# Patient Record
Sex: Female | Born: 1945 | Race: Black or African American | Hispanic: No | Marital: Married | Smoking: Former smoker
Health system: Southern US, Community
[De-identification: ages and names within clinical notes are randomized; demographics above are authoritative.]

## PROBLEM LIST (undated history)

## (undated) DIAGNOSIS — I10 Essential (primary) hypertension: Secondary | ICD-10-CM

## (undated) DIAGNOSIS — E119 Type 2 diabetes mellitus without complications: Secondary | ICD-10-CM

## (undated) DIAGNOSIS — E785 Hyperlipidemia, unspecified: Secondary | ICD-10-CM

## (undated) DIAGNOSIS — G629 Polyneuropathy, unspecified: Secondary | ICD-10-CM

## (undated) DIAGNOSIS — I5022 Chronic systolic (congestive) heart failure: Secondary | ICD-10-CM

## (undated) DIAGNOSIS — K219 Gastro-esophageal reflux disease without esophagitis: Secondary | ICD-10-CM

## (undated) DIAGNOSIS — I779 Disorder of arteries and arterioles, unspecified: Secondary | ICD-10-CM

## (undated) DIAGNOSIS — Z95810 Presence of automatic (implantable) cardiac defibrillator: Secondary | ICD-10-CM

## (undated) DIAGNOSIS — J449 Chronic obstructive pulmonary disease, unspecified: Secondary | ICD-10-CM

## (undated) DIAGNOSIS — M1990 Unspecified osteoarthritis, unspecified site: Secondary | ICD-10-CM

## (undated) DIAGNOSIS — I739 Peripheral vascular disease, unspecified: Secondary | ICD-10-CM

## (undated) DIAGNOSIS — G510 Bell's palsy: Secondary | ICD-10-CM

## (undated) DIAGNOSIS — E1140 Type 2 diabetes mellitus with diabetic neuropathy, unspecified: Secondary | ICD-10-CM

## (undated) DIAGNOSIS — I428 Other cardiomyopathies: Secondary | ICD-10-CM

## (undated) DIAGNOSIS — R0600 Dyspnea, unspecified: Secondary | ICD-10-CM

## (undated) DIAGNOSIS — I2510 Atherosclerotic heart disease of native coronary artery without angina pectoris: Secondary | ICD-10-CM

## (undated) DIAGNOSIS — G458 Other transient cerebral ischemic attacks and related syndromes: Secondary | ICD-10-CM

## (undated) DIAGNOSIS — Z87891 Personal history of nicotine dependence: Secondary | ICD-10-CM

## (undated) DIAGNOSIS — J189 Pneumonia, unspecified organism: Secondary | ICD-10-CM

## (undated) HISTORY — DX: Hyperlipidemia, unspecified: E78.5

## (undated) HISTORY — DX: Type 2 diabetes mellitus without complications: E11.9

## (undated) HISTORY — PX: APPENDECTOMY: SHX54

## (undated) HISTORY — PX: CARDIAC CATHETERIZATION: SHX172

## (undated) HISTORY — PX: TUBAL LIGATION: SHX77

## (undated) HISTORY — DX: Chronic obstructive pulmonary disease, unspecified: J44.9

## (undated) HISTORY — PX: JOINT REPLACEMENT: SHX530

## (undated) HISTORY — PX: ABDOMINAL HYSTERECTOMY: SHX81

## (undated) HISTORY — DX: Essential (primary) hypertension: I10

## (undated) HISTORY — DX: Polyneuropathy, unspecified: G62.9

## (undated) HISTORY — DX: Bell's palsy: G51.0

---
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
## 2001-04-16 ENCOUNTER — Other Ambulatory Visit: Admission: RE | Admit: 2001-04-16 | Discharge: 2001-04-16 | Payer: Self-pay | Admitting: Family Medicine

## 2001-05-08 ENCOUNTER — Emergency Department (HOSPITAL_COMMUNITY): Admission: EM | Admit: 2001-05-08 | Discharge: 2001-05-08 | Payer: Self-pay | Admitting: Emergency Medicine

## 2004-11-23 ENCOUNTER — Ambulatory Visit: Payer: Self-pay | Admitting: Family Medicine

## 2005-05-31 ENCOUNTER — Ambulatory Visit: Payer: Self-pay | Admitting: Family Medicine

## 2005-06-07 ENCOUNTER — Ambulatory Visit: Payer: Self-pay | Admitting: Family Medicine

## 2007-03-25 ENCOUNTER — Encounter (INDEPENDENT_AMBULATORY_CARE_PROVIDER_SITE_OTHER): Payer: Self-pay | Admitting: *Deleted

## 2007-07-31 ENCOUNTER — Ambulatory Visit: Payer: Self-pay | Admitting: Vascular Surgery

## 2008-01-29 ENCOUNTER — Ambulatory Visit: Payer: Self-pay | Admitting: Vascular Surgery

## 2008-02-05 ENCOUNTER — Ambulatory Visit: Payer: Self-pay | Admitting: Vascular Surgery

## 2008-08-12 ENCOUNTER — Ambulatory Visit: Payer: Self-pay | Admitting: Vascular Surgery

## 2010-12-29 ENCOUNTER — Encounter (INDEPENDENT_AMBULATORY_CARE_PROVIDER_SITE_OTHER): Payer: Self-pay | Admitting: Vascular Surgery

## 2010-12-29 DIAGNOSIS — I6529 Occlusion and stenosis of unspecified carotid artery: Secondary | ICD-10-CM

## 2011-01-02 NOTE — Assessment & Plan Note (Signed)
OFFICE VISIT  Lisa [name], Lisa [name] DOB:  28-Sep-1945                                       12/29/2010 ZOXWR#:[id number]  CHIEF COMPLAINT:  Carotid stenosis.  HISTORY OF PRESENT ILLNESS:  The patient is a 65 year old female referred by Dr. Katrinka Blazing for evaluation of carotid stenosis.  She was seen for previous similar situation in November 2009.  At that time she had a moderate left carotid stenosis which is asymptomatic.  At that time the plan was risk factor modification with smoking cessation and a repeat carotid duplex scan.  However, she did not return for follow-up for that duplex scan.  At this time she continues to deny any symptoms of TIA, amaurosis or stroke.  She, unfortunately, continues to smoke.  She states that she does have some occasional numbness and tingling in her fingertips and toes but she denies any frank TIA or localizing neurologic symptoms.  CHRONIC MEDICAL PROBLEMS:  Ongoing tobacco abuse, diabetes, COPD.  These are all currently controlled and followed by Dr. Katrinka Blazing.  SOCIAL HISTORY:  She is self-employed.  She is married.  She has four children.  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  She does not consume alcohol regularly.  FAMILY HISTORY:  Not remarkable for early vascular disease, diabetes or early coronary disease.  REVIEW OF SYSTEMS:  CARDIAC:  She has some shortness of breath when lying flat. GI:  She occasionally has some blood in her stool but this has a history very consistent with hemorrhoid type presentation. All other review of systems are negative.  MEDICATIONS:  Aspirin, blood pressure medication in the form of atenolol.  She did not know the doses of any of these.  ALLERGIES:  She has no known drug allergies.  PHYSICAL EXAMINATION:  Blood pressure 162/71 in the right arm, 124/81 the left arm, heart rate 60 and regular.  HEENT:  Unremarkable.  Neck: Has 2+ carotid pulses with bilateral carotid bruits.  Chest:   Clear to auscultation.  Cardiac:  Regular rate and rhythm without murmur. Abdomen:  Soft, nontender, nondistended.  No masses.  Musculoskeletal: Exam shows no obvious major joint deformities.  Neurologic:  Exam shows symmetric upper extremity and lower extremity motor strength which is 5/5.  Skin:  No open ulcers or rashes.  Peripheral vascular:  She has 1+ radial pulses bilaterally.  She has 2+ femoral pulses and 1+ dorsalis pedis pulses bilaterally.  I reviewed her carotid duplex exam from [facility] dated 12/02/2010.  Her velocities are very similar to her previous ultrasound in 2009 and she is still in the 60% to 80% category on the left side. Of note, she also still has evidence of a left subclavian stenosis but this remains asymptomatic.  I had a lengthy discussion with the patient and her sister today.  I again explained to her that she has a moderate carotid stenosis that warrants continued surveillance with duplex ultrasound and we scheduled her for another ultrasound in six months' time.  If this progresses to greater than 80% we would consider carotid endarterectomy at that time. We would also consider carotid endarterectomy if she develops localizing symptoms prior to 80%.  I again emphasized to her how paramount it is for her to try to quit smoking.  She will try to do this in the future.  Greater than 3 minutes today were spent regarding smoking  cessation. The patient will return for follow-up duplex exam in six months' time and continue to take her aspirin.    Janetta Hora. Fields, MD Electronically Signed  CEF/MEDQ  D:  12/30/2010  T:  01/02/2011  Job:  [id number]  cc:   [facility] Crista Luria, MD

## 2011-02-07 NOTE — Procedures (Signed)
CAROTID DUPLEX EXAM   INDICATION:  Followup known coronary artery disease.   HISTORY:  Diabetes:  No.  Cardiac:  No.  Hypertension:  Yes.  Smoking:  Yes.  Previous Surgery:  No.  CV History:  Amaurosis Fugax  Yes  No, Paresthesias  Yes  No, Hemiparesis  Yes  No                                       RIGHT             LEFT  Brachial systolic pressure:         160               110  Brachial Doppler waveforms:         Biphasic.         Monophasic.  Vertebral direction of flow:        Antegrade.        Retrograde.  DUPLEX VELOCITIES (cm/sec)  CCA peak systolic                   84                79  ECA peak systolic                   193               149  ICA peak systolic                   82                303  ICA end diastolic                   21                75  PLAQUE MORPHOLOGY:                  Heterogeneous.    Heterogeneous.  PLAQUE AMOUNT:                      Moderate to severe.                 Mild.  PLAQUE LOCATION:                    ICA and ECA.      ICA and ECA.   IMPRESSION:  1. 60% to 79% stenosis noted in the left internal carotid artery.  2. 1% to 39% stenosis noted in the right internal carotid artery.  3. Antegrade right vertebral artery.  4. Retrograde left vertebral artery.   ___________________________________________  Janetta Hora Fields, MD   MG/MEDQ  D:  07/31/2007  T:  08/01/2007  Job:  [id number]

## 2011-02-07 NOTE — Assessment & Plan Note (Signed)
OFFICE VISIT   [name], Lisa  DOB:  1945-10-04                                       08/12/2008  MWNUU#:[id number]   The patient returns for followup today.  She has a known left subclavian  artery stenosis and a moderate left-sided carotid stenosis.  These both  continue to remain asymptomatic.  She was last seen in May of 2009.  She  states that she has some occasional numbness in her left arm which feels  like it is going to sleep but she states that this is not that  bothersome to her.  She also states that she has some pain in her left  arm when raising her shoulder up but is also not bothered much by this.  She states that she has not been smoking for 2 months.  She denies any  symptoms of dizziness or easy fatigability of her left upper extremity.   PHYSICAL EXAMINATION:  Vital signs:  On physical exam blood pressure is  153/85 in the right arm, pulse is 57 and regular.  Temperature is 98.1.  Neck:  Has bilateral carotid bruits.  Chest:  Clear to auscultation.  Heart:  Exam is regular rate and rhythm without murmur.  Vascular:  She  has a right 2+ radial pulse and absent left radial pulse.  She has no  evidence of ischemia in her left hand.  The hand is pink, warm and well-  perfused.  Neurologic:  Exam shows symmetric upper extremity and lower  extremity motor strength which is 5/5.   She had a repeat carotid duplex exam today which showed a 50 mm  discrepancy between her left arm pressure and right pressure.  The right  was 140 mm.  The left was 90 mm.  She had a 60-80% left internal carotid  artery stenosis which was essentially unchanged from her last duplex  scan.  She did have retrograde vertebral artery flow on the left side.  This was also consistent with her subclavian stenosis.  She had minimal  right internal carotid artery stenosis.   The patient is still essentially asymptomatic from her subclavian  stenosis.  She states that she  does have some occasional numbness and  tingling in her left arm but states that she is not bothered by this  enough to consider an operation at this time.  She does not have limb  threatening ischemia.  I believe the best option for her is continued  risk factor modification.  Hopefully she will continue to able to  refrain from smoking.  She will follow up with me with repeat carotid  duplex exam in six months' time or sooner if she has any neurologic  symptoms.   Janetta Hora. Fields, MD  Electronically Signed   CEF/MEDQ  D:  08/12/2008  T:  08/13/2008  Job:  [id number]   cc:   Elease Hashimoto A. Benedetto Goad, M.D.

## 2011-02-07 NOTE — Assessment & Plan Note (Signed)
OFFICE VISIT   FLOOR, Sanaz  DOB:  1946-06-10                                       02/05/2008  NFAOZ#:[id number]   The patient returns for followup today.  She has a known left subclavian  artery stenosis.  We are also following her for moderate carotid  stenosis.  Both of these are asymptomatic.   Unfortunately she continues to smoke.  She is taking her aspirin daily.  Her atherosclerotic risk factors remain elevated cholesterol,  hypertension and smoking.  She denies any symptoms of TIA, amaurosis or  stroke.   PHYSICAL EXAMINATION:  Vital signs:  On exam today blood pressure is  166/87 in the right arm.  Pulse is 61 and regular.  HEENT:  Unremarkable.  Vascular:  She has 2+ carotid pulses without bruit.  Chest:  Clear to auscultation.  Cardiac:  Regular rate and rhythm  without murmur.   A less than 40% right internal carotid artery stenosis.  She still has a  60-80% left internal carotid artery stenosis.  This may have increased  slightly from previous exam.  She has antegrade vertebral flow in the  right side with retrograde flow in the left again in agreement with a  left subclavian artery stenosis.   The patient continues to deny any symptoms related to her subclavian  occlusive disease in the left hand.  She occasionally reports some  numbness and tingling in both hands.  This is periodic.  It is not  lasting.  The symptoms do not sound like TIA.  She still has a moderate  carotid stenosis.  I do not believe this is significant enough that she  needs carotid endarterectomy at this time.  However, I did emphasize to  her that if she continues to smoke this can progress over time.  If the  stenosis reaches greater than 80% then she would need a carotid  endarterectomy.  Otherwise if she has no symptoms we will continue to  manage her with risk factor modification alone at this point.  She will  follow up in 6 months' time for repeat duplex  exam.   Janetta Hora. Fields, MD  Electronically Signed   CEF/MEDQ  D:  02/05/2008  T:  02/06/2008  Job:  [id number]   cc:   Elease Hashimoto A. Benedetto Goad, M.D.

## 2011-02-07 NOTE — Assessment & Plan Note (Signed)
OFFICE VISIT   Lisa [name], Lisa [name]  DOB:  03/09/1946                                       07/31/2007  ZOXWR#:[id number]   Lisa [name], Lisa [name]  JX#[id number]  DOB:  Feb 12, 1946   07/31/2007:  The patient is a 65 year old female referred in  consultation by Dr. Benedetto Goad.  She recently had a carotid duplex exam for  evaluation of a left-side carotid bruit.  She denies any prior history  of stroke, TIA or amaurosis.  Her atherosclerotic risk factors include  elevated cholesterol and hypertension.  She denies history of coronary  artery disease, significant family history or diabetes.   PAST SURGICAL HISTORY:  Remarkable for hysterectomy.   PAST MEDICAL HISTORY:  She has a history of hemorrhoids with negative  colonoscopy.  She quit smoking 12 month ago.   MEDICATIONS:  Include:  1. Atenolol once daily.  2. Chantix.  3. A night-time cold medicine currently.   ALLERGIES:  She has no known drug allergies.   SOCIAL HISTORY:  She is married, has 4 children.  Was a heavy smoker  previously.  She states that she is smoking a couple of cigarettes  currently but has almost quit with the use of Chantix.  She does not  consume alcohol regularly.   REVIEW OF SYSTEMS:  She has gained significant weight recently with  trying to quit smoking.  She denies history of chest pain, cardiac  arrhythmia, asthma but she states she did have some wheezing before she  quit smoking.  She has some occasional trouble swallowing and diarrhea.  She denies history of renal dysfunction, DVT, syncopal episodes,  seizures, arthritis, joint pain, depression, anxiety, changes in  eyesight, bleeding or clotting disorders.   PHYSICAL EXAMINATION:  Vital Signs:  Blood pressure is 108/76 in the  left arm, 160/76 in the right arm.  Heart rate 62 and regular.  HEENT:  Unremarkable.  Neck:  She has bilateral carotid bruits with 2+ carotid  pulses.  Chest:  Clear to auscultation.  Cardiac:  Regular  rate and  rhythm without murmur.  Abdomen:  Obese, soft, nontender, nondistended  with no masses.  Normoactive bowel sounds.  Vascular:  She has a 2+  right brachial and radial pulse.  She has absent left brachial and  radial pulse.  She has 2+ femoral and 2+ dorsalis pedis pulses  bilaterally.  Extremities are pink, warm and adequately perfused.  She  has no edema in the legs.   DATA:  She had a carotid duplex exam today which confirmed a 60% to 80%  left internal carotid artery stenosis, less than 40% right internal  carotid artery stenosis with antegrade right vertebral flow and  retrograde left vertebral flow.  Also noted was a blood pressure  discrepancy of 160 mmHg on the right compared to 110 on the left  suggesting subclavian stenosis or occlusion on the left side.  On  further questioning, she did not describe any symptoms related to this.   IMPRESSION AND PLAN:  The patient has a moderate carotid stenosis which  is currently asymptomatic.  I believe the best option for her currently  is risk factor modification including the addition of aspirin once  daily.  If the stenosis progresses to greater than 80% without symptoms  we would consider carotid endarterectomy at that time.  Or if she  becomes  symptomatic prior to that we would consider operation at that  time as well.  I counseled her today in controlling her blood pressure  and that she should always take her blood pressure in her right arm  since she does have a left subclavian stenosis.  I do not believe that  this warrants treatment currently since it is asymptomatic.  She will  follow up in 6 months' time for repeat carotid duplex exam.   Janetta Hora. Fields, MD  Electronically Signed   CEF/MEDQ  D:  08/01/2007  T:  08/01/2007  Job:  [id number]   cc:   Elease Hashimoto A. Benedetto Goad, M.D.

## 2011-02-07 NOTE — Procedures (Signed)
CAROTID DUPLEX EXAM   INDICATION:  Followup carotid artery stenosis.  The patient states pain  in left arm when raising it for approximately 1 month.   HISTORY:  Diabetes:  No.  Cardiac:  No.  Hypertension:  Yes.  Smoking:  No.  Previous Surgery:  No.  CV History:  No.  Amaurosis Fugax No, Paresthesias No, Hemiparesis No                                       RIGHT             LEFT  Brachial systolic pressure:         140               90  Brachial Doppler waveforms:         Biphasic          Monophasic  Vertebral direction of flow:        Antegrade         Retrograde  DUPLEX VELOCITIES (cm/sec)  CCA peak systolic                   75                93  ECA peak systolic                   114               108  ICA peak systolic                   85                307  ICA end diastolic                   25                65  PLAQUE MORPHOLOGY:                  Heterogenous      Heterogenous  PLAQUE AMOUNT:                      Mild              Moderate/severe  PLAQUE LOCATION:                    ICA/bifurcation   ICA/bifurcation   IMPRESSION:  1. Right ICA shows evidence of 1-39% stenosis.  2. Left ICA shows evidence of 60-79% stenosis.  3. Left retrograde vertebral artery combined with brachial pressure      variance suggestive of left subclavian steal syndrome.  4. Right vertebral artery is antegrade.  5. No significant changes from previous study.   ___________________________________________  Janetta Hora Fields, MD   AS/MEDQ  D:  08/12/2008  T:  08/12/2008  Job:  [id number]

## 2011-02-07 NOTE — Procedures (Signed)
CAROTID DUPLEX EXAM   INDICATION:  Followup of known carotid artery disease.  Patient states  that her hands and feet fall asleep (alternating sides) approximately 1-  2 times per week, each episode lasting five minutes, for the past month.   HISTORY:  Diabetes:  No.  Cardiac:  No.  Hypertension:  Yes.  Smoking:  Two cigarettes daily.  Previous Surgery:  No.  CV History:  Amaurosis Fugax No, Paresthesias ?, Hemiparesis ?                                       RIGHT             LEFT  Brachial systolic pressure:         160               106  Brachial Doppler waveforms:         Biphasic          Monophasic  Vertebral direction of flow:        Antegrade         Retrograde  DUPLEX VELOCITIES (cm/sec)  CCA peak systolic                   85                66  ECA peak systolic                   90                111  ICA peak systolic                   81                336  ICA end diastolic                   22                96  PLAQUE MORPHOLOGY:                  Intimal thickening                  Heterogenous  PLAQUE AMOUNT:                      Minimal           Moderate-severe  PLAQUE LOCATION:                    DCCA, bifurcation, ICA              Bifurcation, ICA   IMPRESSION:  1. Right 1-39% internal carotid artery stenosis.  2. Left 60-79% internal carotid artery stenosis.  3. Unable to obtain external carotid artery velocities bilaterally      that were obtained from previous study on 07/31/07.  4. Antegrade flow in right vertebral artery.  5. Left vertebral artery with retrograde flow suggestive of subclavian      steal.  6. Patient scheduled to see Dr. Darrick Penna on 02/05/08 at 2:45 p.m.   ___________________________________________  Janetta Hora. Fields, MD   PB/MEDQ  D:  01/29/2008  T:  01/29/2008  Job:  [id number]

## 2011-06-29 ENCOUNTER — Other Ambulatory Visit: Payer: Self-pay

## 2012-05-02 ENCOUNTER — Encounter: Payer: Self-pay | Admitting: Vascular Surgery

## 2012-09-25 DIAGNOSIS — Z95810 Presence of automatic (implantable) cardiac defibrillator: Secondary | ICD-10-CM

## 2012-09-25 HISTORY — DX: Presence of automatic (implantable) cardiac defibrillator: Z95.810

## 2012-11-13 ENCOUNTER — Encounter: Payer: Self-pay | Admitting: Vascular Surgery

## 2012-11-14 ENCOUNTER — Ambulatory Visit (INDEPENDENT_AMBULATORY_CARE_PROVIDER_SITE_OTHER): Payer: Medicare Other | Admitting: Vascular Surgery

## 2012-11-14 ENCOUNTER — Encounter: Payer: Self-pay | Admitting: Vascular Surgery

## 2012-11-14 VITALS — BP 91/53 | HR 90 | Ht 64.0 in | Wt 185.6 lb

## 2012-11-14 DIAGNOSIS — R209 Unspecified disturbances of skin sensation: Secondary | ICD-10-CM

## 2012-11-14 DIAGNOSIS — M79609 Pain in unspecified limb: Secondary | ICD-10-CM

## 2012-11-14 DIAGNOSIS — I6529 Occlusion and stenosis of unspecified carotid artery: Secondary | ICD-10-CM

## 2012-11-14 NOTE — Progress Notes (Signed)
[facility] [place] HISTORY AND PHYSICAL   History of Present Illness:  Patient is a 67 y.o. year old female who presents for follow-up evaluation for carotid stenosis.  She is on Aspirin for antiplatelet therapy.  His atherosclerotic risk factors remain diabetes, hypertension, smoking remote.   These are all currently stable and followed by her primary care physician.  She denies any new neurologic events including amaurosis or weakness. She does complain of intermittent numbness and tingling in her hands and feet bilaterally. However, the left hand is more bothersome to her. She was previously seen for a moderate carotid stenosis and left subclavian stenosis several years ago. She was lost followup because she did not have health insurance at that time.  She is overall very active and participates at age 8. on a daily basis.  She is not smoking.  Past Medical History  Diagnosis Date  . COPD (chronic obstructive pulmonary disease)   . Diabetes mellitus without complication     boarder line  . Hypertension     Past Surgical History  Procedure Laterality Date  . Abdominal hysterectomy      Review of Systems:  Neurologic: as above Cardiac:denies shortness of breath or chest pain Pulmonary: denies cough or wheeze Musculoskeletal: Left knee pain under evaluation by orthopedics  Social History History  Substance Use Topics  . Smoking status: Former Smoker    Types: Cigarettes    Quit date: 11/14/2010  . Smokeless tobacco: Not on file  . Alcohol Use: No    Allergies  No Known Allergies   Current Outpatient Prescriptions  Medication Sig Dispense Refill  . amLODipine (NORVASC) 5 MG tablet Take 1 tablet by mouth daily.      [name] aspirin 81 MG tablet Take 81 mg by mouth daily.      . Cyanocobalamin (VITAMIN B-12 PO) Take 1 tablet by mouth daily.      . furosemide (LASIX) 20 MG tablet Take 1 tablet by mouth daily.      [name] lisinopril (PRINIVIL,ZESTRIL) 20 MG tablet  Take 1 tablet by mouth daily.      . pravastatin (PRAVACHOL) 40 MG tablet Take 1 tablet by mouth daily.       No current facility-administered medications for this visit.    Physical Examination  Filed Vitals:   11/14/12 1409 11/14/12 1412  BP: 145/66 91/53  Pulse: 90   Height: 5\' 4"  (1.626 m)   Weight: 185 lb 9.6 oz (84.188 kg)   SpO2: 96%     Body mass index is 31.84 kg/(m^2).  General:  Alert and oriented, no acute distress HEENT: Normal Neck: Bilateral carotid bruits Pulmonary: Clear to auscultation bilaterally Cardiac: Regular Rate and Rhythm without murmur Neurologic: Upper and lower extremity motor 5/5 and symmetric Extremities: 2+ femoral pulses absent popliteal and pedal pulses, 2+ brachial and radial right upper extremity, absent brachial and radial left upper extremity  DATA: Recent duplex scan from in the hospital was reviewed. Peak systolic velocities were less than 300 bilaterally. End diastolic velocities were less than 100 bilaterally. Caldwell's criteria was approximately 60-80% bilaterally   ASSESSMENT: Asymptomatic bilateral carotid stenosis, possibly symptomatic left subclavian stenosis. However, this is difficult to determine whether or not this is peripheral neuropathy versus a left hand that is more symptomatic than the right. Possible peripheral arterial disease contributing to the numbness and tingling in her feet bilaterally since she also has abnormal lower extremity pulses.   PLAN:  Continue antiplatelet therapy and risk  factor modification. She will return in 6 months time for repeat carotid duplex exam as well as ABIs. She has continued progression of carotid stenosis or develops symptoms would consider carotid endarterectomy in the near future. She will also pay more attention to her left hand symptoms that we can further delineate whether or not this is related to left subclavian stenosis.  Fabienne Bruns, MD [facility]  [place] Office: [phone] Pager: [phone]

## 2012-11-15 NOTE — Addendum Note (Signed)
Addended by: Sharee Pimple on: 11/15/2012 08:26 AM   Modules accepted: Orders

## 2012-12-25 ENCOUNTER — Telehealth: Payer: Self-pay

## 2012-12-25 DIAGNOSIS — M7989 Other specified soft tissue disorders: Secondary | ICD-10-CM

## 2012-12-25 NOTE — Telephone Encounter (Signed)
Phone call ret'd to pt.  Spoke w/ pt's daughter.  Appt. given for 11:30 for vascular study/venous doppler left lower extremity.  Daughter agrees.

## 2012-12-25 NOTE — Telephone Encounter (Signed)
Pt. Called to report swelling in left lower leg x 2-3 weeks.  States swelling extends from toes to below the knee.  Reports seeing Dr. Ranell Patrick a couple weeks ago, and he recommended to have the [facility] MD evaluate for blood clot.  Pt. denies any pain in left lower leg.  Verbalized " my leg feels like it's going to give-out sometimes."  Pt. has appt. 01/09/13, and is asking for earlier appt.  Discussed w/ Dr. Edilia Bo.  Advised to have pt. come in for lab only for left lower extremity venous doppler, to r/o DVT.  Offered pt. appt. today.  Stated she cannot get a ride today.  Will discuss w/ Dr. Darrick Penna on 12/26/12.

## 2012-12-26 ENCOUNTER — Ambulatory Visit (INDEPENDENT_AMBULATORY_CARE_PROVIDER_SITE_OTHER): Payer: Medicare Other | Admitting: Vascular Surgery

## 2012-12-26 ENCOUNTER — Encounter (INDEPENDENT_AMBULATORY_CARE_PROVIDER_SITE_OTHER): Payer: Medicare Other | Admitting: *Deleted

## 2012-12-26 ENCOUNTER — Other Ambulatory Visit (INDEPENDENT_AMBULATORY_CARE_PROVIDER_SITE_OTHER): Payer: Medicare Other | Admitting: *Deleted

## 2012-12-26 ENCOUNTER — Encounter: Payer: Self-pay | Admitting: Vascular Surgery

## 2012-12-26 VITALS — BP 152/78 | HR 85 | Resp 18 | Ht 64.0 in | Wt 187.7 lb

## 2012-12-26 DIAGNOSIS — I6529 Occlusion and stenosis of unspecified carotid artery: Secondary | ICD-10-CM

## 2012-12-26 DIAGNOSIS — M79609 Pain in unspecified limb: Secondary | ICD-10-CM

## 2012-12-26 DIAGNOSIS — M7989 Other specified soft tissue disorders: Secondary | ICD-10-CM

## 2012-12-26 DIAGNOSIS — I739 Peripheral vascular disease, unspecified: Secondary | ICD-10-CM

## 2012-12-26 NOTE — Progress Notes (Signed)
[facility] [place] HISTORY AND PHYSICAL   CC:  F/u ABI's and Carotid duplex  Lisa Saupe, MD  HPI: This is a 67 y.o. female who has a known history of bilateral carotid stenosis.  She states that she has been having some swelling in her left leg.  She saw Dr. Ranell Patrick who drew some fluid off of her left knee recently.  He was concerned about possible DVT and referred her to our office for venous duplex.  She denies any amaurosis fugax, hemiparesis or difficulty with speech.  She denies any claudication.  She states that she has numbness and tingling in both hands, which comes and goes, but does not favor either hand.  She continues to have numbness and tingling in her feet.  She denies any non healing ulcers on her lower extremities.  She states that she did have some recent chest pain and underwent EKG.  She has since been scheduled for an echocardiogram.  Past Medical History  Diagnosis Date  . COPD (chronic obstructive pulmonary disease)   . Diabetes mellitus without complication     boarder line  . Hypertension   . Carotid artery occlusion    Past Surgical History  Procedure Laterality Date  . Abdominal hysterectomy      No Known Allergies  Current Outpatient Prescriptions  Medication Sig Dispense Refill  . amLODipine (NORVASC) 5 MG tablet Take 1 tablet by mouth daily.      [name] aspirin 81 MG tablet Take 81 mg by mouth daily.      . Cyanocobalamin (VITAMIN B-12 PO) Take 1 tablet by mouth daily.      . furosemide (LASIX) 20 MG tablet Take 1 tablet by mouth daily.      . pravastatin (PRAVACHOL) 40 MG tablet Take 1 tablet by mouth daily.      [name] lisinopril (PRINIVIL,ZESTRIL) 20 MG tablet Take 1 tablet by mouth daily.       No current facility-administered medications for this visit.    History reviewed. No pertinent family history.  History   Social History  . Marital Status: Married    Spouse Name: N/A    Number of Children: N/A  . Years of  Education: N/A   Occupational History  . Not on file.   Social History Main Topics  . Smoking status: Former Smoker    Types: Cigarettes    Quit date: 11/14/2010  . Smokeless tobacco: Not on file  . Alcohol Use: No  . Drug Use: No  . Sexually Active: Not on file   Other Topics Concern  . Not on file   Social History Narrative  . No narrative on file     ROS: [x]  Positive   [ ]  Negative   [ ]  All sytems reviewed and are negative  General: [ ]  Weight loss, [ ]  Fever, [ ]  chills Neurologic: [ ]  Dizziness, [ ]  Blackouts, [ ]  Seizure [ ]  Stroke, [ ]  "Mini stroke", [ ]  Slurred speech, [ ]  Temporary blindness; [ ]  weakness in arms or legs, [ ]  Hoarseness; [X]  numbness/tingling in hands and feet Cardiac: Arly.Keller ] Chest pain/pressure, [ ]  Shortness of breath at rest [ ]  Shortness of breath with exertion, [ ]  Atrial fibrillation or irregular heartbeat; [x]  swelling in lower extremities; [x]  high blood pressure Vascular: [ ]  Pain in legs with walking, [ ]  Pain in legs at rest, [ ]  Pain in legs at night,  [ ]  Non-healing ulcer, [ ]   Blood clot in vein/DVT,   Pulmonary: [ ]  Home oxygen, [ ]  Productive cough, [ ]  Coughing up blood, [ ]  Asthma,  [ ]  Wheezing; [x]  COPD Musculoskeletal:  [ ]  Arthritis, [ ]  Low back pain, [ ]  Joint pain Hematologic: [ ]  Easy Bruising, [ ]  Anemia; [ ]  Hepatitis Gastrointestinal: [ ]  Blood in stool, [ ]  Gastroesophageal Reflux/heartburn, [ ]  Trouble swallowing Urinary: [ ]  chronic Kidney disease, [ ]  on HD - [ ]  MWF or [ ]  TTHS, [ ]  Burning with urination, [ ]  Difficulty urinating Endocrine: [x ] hx of diabetes, [ ]  hx of thyroid disease Skin: [ ]  Rashes, [ ]  Wounds Psychological: [ ]  Anxiety, [ ]  Depression    PHYSICAL EXAMINATION:  Filed Vitals:   12/26/12 1347  BP: 152/78  Pulse: 85  Resp: 18   Body mass index is 32.2 kg/(m^2).  General:  WDWN in NAD Gait: Normal HENT: WNL Eyes: PERRL Pulmonary: normal non-labored breathing , without Rales, rhonchi,   wheezing Cardiac: RRR, without  Murmurs, rubs or gallops; + bilateral carotid bruits Abdomen: soft, NT, no masses Skin: no rashes, ulcers noted Vascular Exam/Pulses: femoral pulses are palpable bilaterally; her pedal pulses are absent; she has a palpable right radial pulse and the left radial pulse is absent. Extremities: without ischemic changes, no Gangrene , no cellulitis; no open wounds; mild edema BLE Musculoskeletal: no muscle wasting or atrophy  Neurologic: A&O X 3; Appropriate Affect ; SENSATION: normal; MOTOR FUNCTION:  moving all extremities equally. Speech is fluent/normal   Non-Invasive Vascular Imaging:   Carotid duplex 12/26/12: 1.  40-59% RICA stenosis 2. 60-79% LICA stenosis 3.  Retrograde left vertebral artery with brachial pressure difference noted.  Lower extremity venous duplex evaluation 12/26/12: No evidence of deep or SVT noted in the LLE  ABI's 12/26/12: Right  0.55 Left 0.76  CBC No results found for this basename: wbc, rbc, hgb, hct, plt, mcv, mch, mchc, rdw, neutrabs, lymphsabs, monoabs, eosabs, basosabs    BMET No results found for this basename: na, k, cl, co2, glucose, bun, creatinine, calcium, gfrnonaa, gfraa     ASSESSMENT/PLAN: 67 y.o. female presents today for f/u ABI's, carotid duplex, and venous duplex.   -her venous duplex is negative for DVT -Continue to monitor her carotid stenosis.  Advised pt and her daughter that it will not need surgical intervention until it is greater than 80%.  They are given the signs to look for should she become symptomatic. -left subclavian stenosis is likely asymptomatic since her symptoms do not favor one hand or the other. -will continue to monitor her lower extremity arterial system.  As of now, Dr. Darrick Penna feels as if she has enough blood flow to the lower extremities to heal a left total knee replacement should she need one.   -she will f/u her echocardiogram with her cardiologist -continue ASA for anti  platelet therapy -continue her statin therapy (Pravachol 40 mg qday) -the symptoms that she describes in her hands and feet are most likely neuropathy -continue anti hypertensive's for blood pressure control.   Doreatha Massed, PA-C [facility] [phone]  Clinic MD:  Pt seen and examined in conjunction with Dr. Darrick Penna  History and exam details as above. Patient has moderate peripheral arterial disease. However she does not really describe claudication symptoms. She has no history of rest pain or nonhealing wounds. Her carotid stenosis is asymptomatic. She is on maximal medical management for this. She has been having some swelling in her  lower extremities recently. She is scheduled to have an echocardiogram in the near future which may explain some of her lower extremity swelling. She will followup with [place] in 6 months time to review her peripheral arterial disease and carotid stenosis again. She'll followup sooner she develops any symptoms of TIA amaurosis stroke disabling claudication or nonhealing wounds.  Fabienne Bruns, MD [facility] [place] Office: [phone] Pager: [phone]

## 2013-01-01 ENCOUNTER — Other Ambulatory Visit: Payer: Self-pay | Admitting: *Deleted

## 2013-01-01 DIAGNOSIS — I739 Peripheral vascular disease, unspecified: Secondary | ICD-10-CM

## 2013-01-09 ENCOUNTER — Ambulatory Visit: Payer: Medicare Other | Admitting: Vascular Surgery

## 2013-01-09 ENCOUNTER — Other Ambulatory Visit: Payer: Medicare Other

## 2013-02-26 ENCOUNTER — Telehealth: Payer: Self-pay

## 2013-02-26 NOTE — Telephone Encounter (Signed)
Pt. Called to report having Shortness of breath, chest pain, and numbness in fingers of both hands.  Reports recent diagnosis of CHF and was hospitalized approx. 3 weeks ago for this.  States last week had a test in [facility] where "they ran dye, and checked for blockage" in her heart, but did not find any.  States I still have the chest pain.  Also reports that she has gained one pound.  Advised she needs to contact her cardiologist with c/o SOB, chest pain, and wt. Gain.  States "my cardiologist is out of town for 2 wks, and I am looking for another one, anyway."  Advised that she should report the above sx's to her PCP, with the cardiologist not available.  Questioned further about her numbness in her fingers; stated she has continued to have numbness in fingers of both hands, that comes and goes.  Denies any problems with speech, loss of vision, or weakness of extremities.  Advised pt. Will inform Dr. Darrick Penna of continued numbness of fingers.  Pt. Stated she would call her PCP to report other symptoms with heart.

## 2013-02-27 NOTE — Telephone Encounter (Signed)
Dr. Darrick Penna made aware of pt's continued c/o numbness of fingers of both hands.  Stated that it is okay to keep pt's. F/u at the 6 month interval, as her numbness has been chronic.  Pt. made aware of Dr. Darrick Penna response.  Will plan to keep her f/u appt. in October.  Agrees with plan.

## 2013-04-25 HISTORY — PX: CARDIAC DEFIBRILLATOR PLACEMENT: SHX171

## 2013-05-15 ENCOUNTER — Ambulatory Visit: Payer: Medicare Other | Admitting: Vascular Surgery

## 2013-05-15 ENCOUNTER — Other Ambulatory Visit: Payer: Medicare Other

## 2013-07-02 ENCOUNTER — Encounter: Payer: Self-pay | Admitting: Vascular Surgery

## 2013-07-03 ENCOUNTER — Encounter: Payer: Self-pay | Admitting: Vascular Surgery

## 2013-07-03 ENCOUNTER — Ambulatory Visit (INDEPENDENT_AMBULATORY_CARE_PROVIDER_SITE_OTHER)
Admission: RE | Admit: 2013-07-03 | Discharge: 2013-07-03 | Disposition: A | Discharge status: Home / Self Care | Payer: Medicare Other | Source: Ambulatory Visit | Attending: Vascular Surgery | Admitting: Vascular Surgery

## 2013-07-03 ENCOUNTER — Ambulatory Visit (HOSPITAL_COMMUNITY)
Admission: RE | Admit: 2013-07-03 | Discharge: 2013-07-03 | Disposition: A | Discharge status: Home / Self Care | Payer: Medicare Other | Source: Ambulatory Visit | Attending: Vascular Surgery | Admitting: Vascular Surgery

## 2013-07-03 ENCOUNTER — Ambulatory Visit (INDEPENDENT_AMBULATORY_CARE_PROVIDER_SITE_OTHER): Payer: Medicare Other | Admitting: Vascular Surgery

## 2013-07-03 DIAGNOSIS — I6529 Occlusion and stenosis of unspecified carotid artery: Secondary | ICD-10-CM

## 2013-07-03 DIAGNOSIS — I739 Peripheral vascular disease, unspecified: Secondary | ICD-10-CM

## 2013-07-03 NOTE — Progress Notes (Signed)
[facility] [place] HISTORY AND PHYSICAL    HPI: This is a 67 y.o. female who has a known history of bilateral carotid stenosis and peripheral arterial disease. She denies any amaurosis fugax, hemiparesis or difficulty with speech.  She denies any claudication.  She states that she has numbness and tingling in both hands, which comes and goes, but does not favor either hand.  She continues to have numbness and tingling in her feet.  She denies any non healing ulcers on her lower extremities.  She states that she did have some recent chest pain and underwent EKG.  She recently had a left-sided AICD placed. 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     Past Medical History  Diagnosis Date  . COPD (chronic obstructive pulmonary disease)   . Diabetes mellitus without complication     boarder line  . Hypertension   . Carotid artery occlusion   . CHF (congestive heart failure)   . Bell's palsy     Past Surgical History  Procedure Laterality Date  . Abdominal hysterectomy     Current Outpatient Prescriptions on File Prior to Visit  Medication Sig Dispense Refill  . amLODipine (NORVASC) 5 MG tablet Take 1 tablet by mouth daily.      [name] aspirin 81 MG tablet Take 81 mg by mouth daily.      . Cyanocobalamin (VITAMIN B-12 PO) Take 1 tablet by mouth daily.      . furosemide (LASIX) 20 MG tablet Take 40 mg by mouth daily.       [name] lisinopril (PRINIVIL,ZESTRIL) 20 MG tablet Take 1 tablet by mouth daily.      . pravastatin (PRAVACHOL) 40 MG tablet Take 1 tablet by mouth daily.       No current facility-administered medications on file prior to visit.       Social History Main Topics   .  Smoking status:  Former Smoker       Types:  Cigarettes       Quit date:  11/14/2010   .  Smokeless tobacco:  Not on file   .  Alcohol Use:  No   .  Drug Use:  No   .  Sexually Active:  Not on file      Other Topics  Concern   .  Not on file      Social History Narrative    .  No narrative on file      ROS: [x]  Positive   [ ]  Negative   [ ]  All sytems reviewed and are negative  General: [ ]  Weight loss, [ ]  Fever, [ ]  chills Neurologic: [ ]  Dizziness, [ ]  Blackouts, [ ]  Seizure [ ]  Stroke, [ ]  "Mini stroke", [ ]  Slurred speech, [ ]  Temporary blindness; [ ]  weakness in arms or legs, [ ]  Hoarseness; [X]  numbness/tingling in hands and feet Cardiac: Arly.Keller ] Chest pain/pressure, [ ]  Shortness of breath at rest [ ]  Shortness of breath with exertion, [ ]  Atrial fibrillation or irregular heartbeat; [x]  swelling in lower extremities; [x]  high blood pressure Vascular: [ ]  Pain in legs with walking, [ ]  Pain in legs at rest, [ ]  Pain in legs at night,  [ ]  Non-healing ulcer, [ ]  Blood clot in vein/DVT,    Pulmonary: [ ]  Home oxygen, [ ]  Productive cough, [ ]  Coughing up blood, [ ]  Asthma,  [x ] Wheezing; [x]  COPD Musculoskeletal:  [ ]  Arthritis, [ ]   Low back pain, [ ]  Joint pain Hematologic: [ ]  Easy Bruising, [ ]  Anemia; [ ]  Hepatitis Gastrointestinal: [ ]  Blood in stool, [ ]  Gastroesophageal Reflux/heartburn, [ ]  Trouble swallowing Urinary: [ ]  chronic Kidney disease, [ ]  on HD - [ ]  MWF or [ ]  TTHS, [ ]  Burning with urination, [ ]  Difficulty urinating Endocrine: [x ] hx of diabetes, [ ]  hx of thyroid disease Skin: [ ]  Rashes, [ ]  Wounds Psychological: [ ]  Anxiety, [ ]  Depression    PHYSICAL EXAMINATION:    Filed Vitals:   07/03/13 1528 07/03/13 1530  BP: 125/83 77/56  Pulse: 65   Height: 5\' 4"  (1.626 m)   Weight: 186 lb (84.369 kg)   SpO2: 98%     General:  WDWN in NAD HENT: WNL Eyes: PERRL Neck: Bilateral carotid bruits right greater than left Pulmonary: normal non-labored breathing , without Rales, rhonchi,  wheezing Cardiac: RRR + bilateral carotid bruits Abdomen: soft, NT, no masses Skin: no rashes, ulcers noted Vascular Exam/Pulses: femoral pulses are palpable bilaterally; her pedal pulses are absent; she has a palpable right radial pulse and the  left radial pulse is absent. Extremities: without ischemic changes, no Gangrene , no cellulitis; no open wounds; mild edema BLE Musculoskeletal: no muscle wasting or atrophy       Neurologic: A&O X 3; Appropriate Affect ; SENSATION: normal; MOTOR FUNCTION:  moving all extremities equally. Speech is fluent/normal   Non-Invasive Vascular Imaging:   She had a bilateral carotid duplex today. I reviewed and interpreted this study. This showed 60-80% left internal carotid artery stenosis, 40-60% right internal carotid artery stenosis, left brachial pressure was 40 mm gradient less than right just above left subclavian stenosis. She also had bilateral ABIs performed which were 0.48 on the right 0.59 on the left which is slightly decreased from April of 2014  ABI's 12/26/12: Right  0.55 Left 0.76  ASSESSMENT/PLAN: 68 y.o. female presents today for f/u ABI's, carotid duplex.    Patient has moderate peripheral arterial disease. However she does not really describe claudication symptoms. She has no history of rest pain or nonhealing wounds. Her carotid stenosis is asymptomatic. She is on maximal medical management for this. She has been having some swelling in her lower extremities recently.She will followup with [place] in 6 months time to review her peripheral arterial disease and carotid stenosis again. She'll followup sooner she develops any symptoms of TIA amaurosis stroke disabling claudication or nonhealing wounds.  Fabienne Bruns, MD [facility] [place] Office: [phone] Pager: [phone]

## 2013-07-04 NOTE — Addendum Note (Signed)
Addended by: Sharee Pimple on: 07/04/2013 08:46 AM   Modules accepted: Orders

## 2014-01-01 ENCOUNTER — Other Ambulatory Visit (HOSPITAL_COMMUNITY): Payer: Medicare Other

## 2014-01-01 ENCOUNTER — Encounter (HOSPITAL_COMMUNITY): Payer: Medicare Other

## 2014-01-01 ENCOUNTER — Ambulatory Visit: Payer: Medicare Other | Admitting: Vascular Surgery

## 2014-01-15 ENCOUNTER — Other Ambulatory Visit (HOSPITAL_COMMUNITY): Payer: Medicare Other

## 2014-01-15 ENCOUNTER — Encounter (HOSPITAL_COMMUNITY): Payer: Medicare Other

## 2014-01-15 ENCOUNTER — Ambulatory Visit: Payer: Medicare Other | Admitting: Vascular Surgery

## 2014-01-21 ENCOUNTER — Encounter: Payer: Self-pay | Admitting: Vascular Surgery

## 2014-01-22 ENCOUNTER — Other Ambulatory Visit (HOSPITAL_COMMUNITY): Payer: Medicare Other

## 2014-01-22 ENCOUNTER — Ambulatory Visit: Payer: Medicare Other | Admitting: Family

## 2014-01-22 ENCOUNTER — Encounter (HOSPITAL_COMMUNITY): Payer: Medicare Other

## 2014-01-22 ENCOUNTER — Ambulatory Visit: Payer: Medicare Other | Admitting: Vascular Surgery

## 2014-03-18 ENCOUNTER — Encounter: Payer: Self-pay | Admitting: Family

## 2014-03-19 ENCOUNTER — Ambulatory Visit (INDEPENDENT_AMBULATORY_CARE_PROVIDER_SITE_OTHER)
Admission: RE | Admit: 2014-03-19 | Discharge: 2014-03-19 | Disposition: A | Discharge status: Home / Self Care | Payer: Medicare Other | Source: Ambulatory Visit | Attending: Vascular Surgery | Admitting: Vascular Surgery

## 2014-03-19 ENCOUNTER — Ambulatory Visit (HOSPITAL_COMMUNITY)
Admission: RE | Admit: 2014-03-19 | Discharge: 2014-03-19 | Disposition: A | Discharge status: Home / Self Care | Payer: Medicare Other | Source: Ambulatory Visit | Attending: Family | Admitting: Family

## 2014-03-19 ENCOUNTER — Ambulatory Visit (INDEPENDENT_AMBULATORY_CARE_PROVIDER_SITE_OTHER): Payer: Medicare Other | Admitting: Family

## 2014-03-19 ENCOUNTER — Encounter: Payer: Self-pay | Admitting: Family

## 2014-03-19 VITALS — BP 90/62 | HR 85 | Resp 16 | Ht 64.0 in | Wt 185.0 lb

## 2014-03-19 DIAGNOSIS — M79671 Pain in right foot: Secondary | ICD-10-CM | POA: Insufficient documentation

## 2014-03-19 DIAGNOSIS — I739 Peripheral vascular disease, unspecified: Secondary | ICD-10-CM

## 2014-03-19 DIAGNOSIS — I6529 Occlusion and stenosis of unspecified carotid artery: Secondary | ICD-10-CM

## 2014-03-19 DIAGNOSIS — R209 Unspecified disturbances of skin sensation: Secondary | ICD-10-CM

## 2014-03-19 DIAGNOSIS — R200 Anesthesia of skin: Secondary | ICD-10-CM

## 2014-03-19 DIAGNOSIS — R202 Paresthesia of skin: Secondary | ICD-10-CM | POA: Insufficient documentation

## 2014-03-19 DIAGNOSIS — M79609 Pain in unspecified limb: Secondary | ICD-10-CM

## 2014-03-19 NOTE — Patient Instructions (Signed)
Stroke Prevention Some medical conditions and behaviors are associated with an increased chance of having a stroke. You may prevent a stroke by making healthy choices and managing medical conditions. HOW CAN I REDUCE MY RISK OF HAVING A STROKE?   Stay physically active. Get at least 30 minutes of activity on most or all days.  Do not smoke. It may also be helpful to avoid exposure to secondhand smoke.  Limit alcohol use. Moderate alcohol use is considered to be:  No more than 2 drinks per day for men.  No more than 1 drink per day for nonpregnant women.  Eat healthy foods. This involves  Eating 5 or more servings of fruits and vegetables a day.  Following a diet that addresses high blood pressure (hypertension), high cholesterol, diabetes, or obesity.  Manage your cholesterol levels.  A diet low in saturated fat, trans fat, and cholesterol and high in fiber may control cholesterol levels.  Take any prescribed medicines to control cholesterol as directed by your health care provider.  Manage your diabetes.  A controlled-carbohydrate, controlled-sugar diet is recommended to manage diabetes.  Take any prescribed medicines to control diabetes as directed by your health care provider.  Control your hypertension.  A low-salt (sodium), low-saturated fat, low-trans fat, and low-cholesterol diet is recommended to manage hypertension.  Take any prescribed medicines to control hypertension as directed by your health care provider.  Maintain a healthy weight.  A reduced-calorie, low-sodium, low-saturated fat, low-trans fat, low-cholesterol diet is recommended to manage weight.  Stop drug abuse.  Avoid taking birth control pills.  Talk to your health care provider about the risks of taking birth control pills if you are over 35 years old, smoke, get migraines, or have ever had a blood clot.  Get evaluated for sleep disorders (sleep apnea).  Talk to your health care provider about  getting a sleep evaluation if you snore a lot or have excessive sleepiness.  Take medicines as directed by your health care provider.  For some people, aspirin or blood thinners (anticoagulants) are helpful in reducing the risk of forming abnormal blood clots that can lead to stroke. If you have the irregular heart rhythm of atrial fibrillation, you should be on a blood thinner unless there is a good reason you cannot take them.  Understand all your medicine instructions.  Make sure that other other conditions (such as anemia or atherosclerosis) are addressed. SEEK IMMEDIATE MEDICAL CARE IF:   You have sudden weakness or numbness of the face, arm, or leg, especially on one side of the body.  Your face or eyelid droops to one side.  You have sudden confusion.  You have trouble speaking (aphasia) or understanding.  You have sudden trouble seeing in one or both eyes.  You have sudden trouble walking.  You have dizziness.  You have a loss of balance or coordination.  You have a sudden, severe headache with no known cause.  You have new chest pain or an irregular heartbeat. Any of these symptoms may represent a serious problem that is an emergency. Do not wait to see if the symptoms will go away. Get medical help at once. Call your local emergency services  (911 in [place] Do not drive yourself to the hospital. Document Released: 10/19/2004 Document Revised: 07/02/2013 Document Reviewed: 03/14/2013 [employer] Patient Information 2015 [employer]. This information is not intended to replace advice given to you by your health care provider. Make sure you discuss any questions you have with your health   care provider.   Peripheral Vascular Disease Peripheral Vascular Disease (PVD), also called Peripheral Arterial Disease (PAD), is a circulation problem caused by cholesterol (atherosclerotic plaque) deposits in the arteries. PVD commonly occurs in the lower extremities (legs) but it can  occur in other areas of the body, such as your arms. The cholesterol buildup in the arteries reduces blood flow which can cause pain and other serious problems. The presence of PVD can place a person at risk for Coronary Artery Disease (CAD).  CAUSES  Causes of PVD can be many. It is usually associated with more than one risk factor such as:   High Cholesterol.  Smoking.  Diabetes.  Lack of exercise or inactivity.  High blood pressure (hypertension).  Obesity.  Family history. SYMPTOMS   When the lower extremities are affected, patients with PVD may experience:  Leg pain with exertion or physical activity. This is called INTERMITTENT CLAUDICATION. This may present as cramping or numbness with physical activity. The location of the pain is associated with the level of blockage. For example, blockage at the abdominal level (distal abdominal aorta) may result in buttock or hip pain. Lower leg arterial blockage may result in calf pain.  As PVD becomes more severe, pain can develop with less physical activity.  In people with severe PVD, leg pain may occur at rest.  Other PVD signs and symptoms:  Leg numbness or weakness.  Coldness in the affected leg or foot, especially when compared to the other leg.  A change in leg color.  Patients with significant PVD are more prone to ulcers or sores on toes, feet or legs. These may take longer to heal or may reoccur. The ulcers or sores can become infected.  If signs and symptoms of PVD are ignored, gangrene may occur. This can result in the loss of toes or loss of an entire limb.  Not all leg pain is related to PVD. Other medical conditions can cause leg pain such as:  Blood clots (embolism) or Deep Vein Thrombosis.  Inflammation of the blood vessels (vasculitis).  Spinal stenosis. DIAGNOSIS  Diagnosis of PVD can involve several different types of tests. These can include:  Pulse Volume Recording Method (PVR). This test is simple,  painless and does not involve the use of X-rays. PVR involves measuring and comparing the blood pressure in the arms and legs. An ABI (Ankle-Brachial Index) is calculated. The normal ratio of blood pressures is 1. As this number becomes smaller, it indicates more severe disease.  < 0.95 - indicates significant narrowing in one or more leg vessels.  <0.8 - there will usually be pain in the foot, leg or buttock with exercise.  <0.4 - will usually have pain in the legs at rest.  <0.25 - usually indicates limb threatening PVD.  Doppler detection of pulses in the legs. This test is painless and checks to see if you have a pulses in your legs/feet.  A dye or contrast material (a substance that highlights the blood vessels so they show up on x-ray) may be given to help your caregiver better see the arteries for the following tests. The dye is eliminated from your body by the kidney's. Your caregiver may order blood work to check your kidney function and other laboratory values before the following tests are performed:  Magnetic Resonance Angiography (MRA). An MRA is a picture study of the blood vessels and arteries. The MRA machine uses a large magnet to produce images of the blood vessels.  Computed   Tomography Angiography (CTA). A CTA is a specialized x-ray that looks at how the blood flows in your blood vessels. An IV may be inserted into your arm so contrast dye can be injected.  Angiogram. Is a procedure that uses x-rays to look at your blood vessels. This procedure is minimally invasive, meaning a small incision (cut) is made in your groin. A small tube (catheter) is then inserted into the artery of your groin. The catheter is guided to the blood vessel or artery your caregiver wants to examine. Contrast dye is injected into the catheter. X-rays are then taken of the blood vessel or artery. After the images are obtained, the catheter is taken out. TREATMENT  Treatment of PVD involves many  interventions which may include:  Lifestyle changes:  Quitting smoking.  Exercise.  Following a low fat, low cholesterol diet.  Control of diabetes.  Foot care is very important to the PVD patient. Good foot care can help prevent infection.  Medication:  Cholesterol-lowering medicine.  Blood pressure medicine.  Anti-platelet drugs.  Certain medicines may reduce symptoms of Intermittent Claudication.  Interventional/Surgical options:  Angioplasty. An Angioplasty is a procedure that inflates a balloon in the blocked artery. This opens the blocked artery to improve blood flow.  Stent Implant. A wire mesh tube (stent) is placed in the artery. The stent expands and stays in place, allowing the artery to remain open.  Peripheral Bypass Surgery. This is a surgical procedure that reroutes the blood around a blocked artery to help improve blood flow. This type of procedure may be performed if Angioplasty or stent implants are not an option. SEEK IMMEDIATE MEDICAL CARE IF:   You develop pain or numbness in your arms or legs.  Your arm or leg turns cold, becomes blue in color.  You develop redness, warmth, swelling and pain in your arms or legs. MAKE SURE YOU:   Understand these instructions.  Will watch your condition.  Will get help right away if you are not doing well or get worse. Document Released: 10/19/2004 Document Revised: 12/04/2011 Document Reviewed: 09/15/2008 [facility] Patient Information 2015 [place], [place]. This information is not intended to replace advice given to you by your health care provider. Make sure you discuss any questions you have with your health care provider.

## 2014-03-19 NOTE — Progress Notes (Signed)
[facility] [place] HISTORY AND PHYSICAL   MRN : [id number]  History of Present Illness:   Lisa [name] is a 68 y.o. female patient of Dr. Oneida Alar who has a known history of bilateral carotid stenosis and peripheral arterial disease. She denies any amaurosis fugax, hemiparesis or difficulty with speech. She denies any claudication. She states that she has numbness and tingling in both hands, which comes and goes, but does not favor either hand. She continues to have numbness and tingling in her feet. She denies any non healing ulcers on her lower extremities.  Was seen recently at [facility] ED for UTI and low back pain, was told she may have a pinched nerve in her low back, is trying to get an appointment with [facility], soonest available is August, per pt. She has tingling and numbness in hands and feet for about a year, states her PCP is aware, takes gabapentin which has helped her feet. States she was told that she is borderline DM. She denies claudication symptoms with walking, denies non healing wounds. Pt denies any history of stroke or TIA.  Pt Diabetic: "borderline" Pt smoker: former smoker, quit about 2010  Pt meds include: Statin :Yes Betablocker: Yes ASA: Yes Other anticoagulants/antiplatelets: no  Current Outpatient Prescriptions  Medication Sig Dispense Refill  . amLODipine (NORVASC) 5 MG tablet Take 1 tablet by mouth daily.      [name] aspirin 81 MG tablet Take 81 mg by mouth daily.      [name] atorvastatin (LIPITOR) 40 MG tablet Take 1 tablet by mouth daily.      . calcium carbonate (OS-CAL) 600 MG TABS tablet Take 600 mg by mouth daily with breakfast.      . carvedilol (COREG) 12.5 MG tablet Take 12.5 mg by mouth 2 (two) times daily with a meal.      . Cholecalciferol (CVS VIT D 5000 HIGH-POTENCY PO) Take by mouth 2 (two) times a week.      . Cyanocobalamin (VITAMIN B-12 PO) Take 1 tablet by mouth daily.      . digoxin (LANOXIN) 0.125  MG tablet Take by mouth 3 (three) times a week. Take 1/2 Tab.      . disopyramide (NORPACE) 100 MG capsule Take by mouth 4 (four) times daily.      . furosemide (LASIX) 20 MG tablet Take 40 mg by mouth daily.       [name] gabapentin (NEURONTIN) 300 MG capsule Take 300 mg by mouth 3 (three) times daily.      [name] lisinopril (PRINIVIL,ZESTRIL) 20 MG tablet Take 1 tablet by mouth daily.      . Potassium Gluconate 595 MG CAPS Take by mouth daily.      . pravastatin (PRAVACHOL) 40 MG tablet Take 1 tablet by mouth daily.      . traZODone (DESYREL) 100 MG tablet Take 100 mg by mouth at bedtime.       No current facility-administered medications for this visit.    Past Medical History  Diagnosis Date  . COPD (chronic obstructive pulmonary disease)   . Diabetes mellitus without complication     boarder line  . Hypertension   . Carotid artery occlusion   . CHF (congestive heart failure)   . Bell's palsy     Past Surgical History  Procedure Laterality Date  . Abdominal hysterectomy    . Cardiac defibrillator placement  Aug. 2014  . Joint replacement Bilateral     implants  Social History History  Substance Use Topics  . Smoking status: Former Smoker    Types: Cigarettes    Quit date: 11/14/2010  . Smokeless tobacco: Never Used  . Alcohol Use: No    Family History Family History  Problem Relation Age of Onset  . Heart disease Mother   . Hypertension Mother     No Known Allergies   REVIEW OF SYSTEMS: See HPI for pertinent positives and negatives.  Physical Examination Filed Vitals:   03/19/14 1054 03/19/14 1104  BP: 124/71 90/62  Pulse: 85 85  Resp:  16  Height:  5\' 4"  (1.626 m)  Weight:  185 lb (83.915 kg)  SpO2:  100%   Body mass index is 31.74 kg/(m^2).  General:  WDWN in NAD Gait: Normal HENT: WNL Eyes: Pupils equal Pulmonary: normal non-labored breathing , without Rales, rhonchi,  wheezing Cardiac: RRR, no murmurs detected  Abdomen: soft, NT, no masses Skin:  no rashes, ulcers noted;  no Gangrene , no cellulitis; no open wounds;   VASCULAR EXAM  Carotid Bruits Left Right   Negative Positive                             VASCULAR EXAM: Extremities without ischemic changes  without Gangrene; without open wounds.                                                                                                          LE Pulses LEFT RIGHT       FEMORAL  not palpable  not palpable        POPLITEAL  not palpable   not palpable       POSTERIOR TIBIAL  not palpable   not palpable        DORSALIS PEDIS      ANTERIOR TIBIAL not palpable  not palpable      Musculoskeletal: no muscle wasting or atrophy; no edema  Neurologic: A&O X 3; Appropriate Affect ;  SENSATION: normal; MOTOR FUNCTION: 5/5 Symmetric, CN 2-12 intact Speech is fluent/normal   Significant Diagnostic Studies:   Non-Invasive Vascular Imaging:  CEREBROVASCULAR DUPLEX EVALUATION    INDICATION: Carotid artery disease     PREVIOUS INTERVENTION(S):     DUPLEX EXAM:     RIGHT  LEFT  Peak Systolic Velocities (cm/s) End Diastolic Velocities (cm/s) Plaque LOCATION Peak Systolic Velocities (cm/s) End Diastolic Velocities (cm/s) Plaque  76 19  CCA PROXIMAL 104 24   104 28  CCA MID 83 27   77 24  CCA DISTAL 85 24   84 12  ECA 122 23   200 66 HT ICA PROXIMAL 244 77 HT  163 50  ICA MID 170 52   61 24  ICA DISTAL 190 46     1.92 ICA / CCA Ratio (PSV) 2.93  Antegrade  Vertebral Flow Retrograde   267 Brachial Systolic Pressure (mmHg) 94  Triphasic  Brachial Artery Waveforms Monophasic     Plaque Morphology:  HM = Homogeneous, HT =  Heterogeneous, CP = Calcific Plaque, SP = Smooth Plaque, IP = Irregular Plaque     ADDITIONAL FINDINGS: Brachial pressure difference.    IMPRESSION: Right internal carotid artery velocities suggest a 60-79% stenosis (low end of range). Left internal carotid artery velocities suggest a 60-79% stenosis.  Retrograde left vertebral artery.      Compared to the previous exam:  Disease progression of the right internal carotid artery; left remains stable compared to the previous exam on 07/03/2013.   LOWER EXTREMITY ARTERIAL DUPLEX EVALUATION    INDICATION: Peripheral vascular disease     PREVIOUS INTERVENTION(S):     DUPLEX EXAM:     RIGHT  LEFT   Peak Systolic Velocity (cm/s) Ratio (if abnormal) Waveform  Peak Systolic Velocity (cm/s) Ratio (if abnormal) Waveform  97  M  Common Femoral Artery 75  M  52  M Deep Femoral Artery 36  M  55  M Superficial Femoral Artery Proximal 54  M   50  M Superficial Femoral Artery Mid 60  M   21  M Superficial Femoral Artery Distal 144 2.18 M  59  M Popliteal Artery 76  M  69  M Posterior Tibial Artery Dist 40  M   28  M Anterior Tibial Artery Distal 31          M   22  M Peroneal Artery Distal 34  M    0.50 Today's ABI / TBI 0.65  0.48 Previous ABI / TBI (07/03/2013  ) 0.59    Waveform:    M - Monophasic       B - Biphasic       T - Triphasic  If Ankle Brachial Index (ABI) or Toe Brachial Index (TBI) performed, please see complete report     ADDITIONAL FINDINGS:     IMPRESSION: Patent right lower extremity arterial system with monophasic waveforms noted throughout, suggestive of more proximal disease. Patent left lower extremity arterial system with monophasic waveforms noted throughout; however, a stenosis of >50% is also noted in the distal superficial femoral artery.    Compared to the previous exam:  No prior Duplex exam performed at this facility for comparison.      ASSESSMENT:  Lisa [name] is a 68 y.o. female who has a known history of bilateral carotid stenosis and peripheral arterial disease. She denies any amaurosis fugax, hemiparesis or difficulty with speech. She denies any claudication. She states that she has numbness and tingling in both hands, which comes and goes, but does not favor either hand. She continues to have numbness and tingling in her feet. She  denies any non healing ulcers on her lower extremities.  Was seen recently at [facility] ED for UTI and low back pain, was told she may have a pinched nerve in her low back, is trying to get an appointment with [facility], soonest available is August, per pt. She has tingling and numbness in hands and feet for about a year, states her PCP is aware, takes gabapentin which has helped her feet. States she was told that she is borderline DM. She denies claudication symptoms with walking, denies non healing wounds. Pt denies any history of stroke or TIA. Severe arterial occlusive disease in the right leg, moderate in the left leg, slightly improved in the right leg compared to previous ABI.  Right internal carotid artery velocities suggest a 60-79% stenosis (low end of range). Left internal carotid artery velocities suggest a 60-79% stenosis. Disease progression of  the right internal carotid artery; left remains stable compared to the previous exam on 07/03/2013  PLAN:   Continue daily exercising, especially walking, graduated walking program. Based on today's exam and non-invasive vascular lab results, and after discussing with Dr. Oneida Alar, the patient will follow up in 6 months with the following tests ABI's, bilateral LE arterial Duplex, and carotid Duplex. I discussed in depth with the patient the nature of atherosclerosis, and emphasized the importance of maximal medical management including strict control of blood pressure, blood glucose, and lipid levels, obtaining regular exercise, and cessation of smoking.  The patient is aware that without maximal medical management the underlying atherosclerotic disease process will progress, limiting the benefit of any interventions. The patient was given information about stroke prevention and what symptoms should prompt the patient to seek immediate medical care.  The patient was given information about PAD including signs, symptoms,  treatment, what symptoms should prompt the patient to seek immediate medical care, and risk reduction measures to take. Thank you for allowing [place] to participate in this patient's care.  Clemon Chambers, RN, MSN, FNP-C [facility] Office: [phone]  Clinic MD: [facility] 03/19/2014 11:07 AM

## 2014-05-04 ENCOUNTER — Other Ambulatory Visit: Payer: Self-pay | Admitting: Orthopedic Surgery

## 2014-05-04 DIAGNOSIS — M21371 Foot drop, right foot: Secondary | ICD-10-CM

## 2014-05-04 DIAGNOSIS — M5441 Lumbago with sciatica, right side: Secondary | ICD-10-CM

## 2014-05-11 ENCOUNTER — Other Ambulatory Visit: Payer: Medicare Other

## 2014-09-24 ENCOUNTER — Other Ambulatory Visit (HOSPITAL_COMMUNITY): Payer: Medicare Other

## 2014-09-24 ENCOUNTER — Encounter (HOSPITAL_COMMUNITY): Payer: Medicare Other

## 2014-09-24 ENCOUNTER — Ambulatory Visit: Payer: Medicare Other | Admitting: Family

## 2014-09-29 ENCOUNTER — Encounter: Payer: Self-pay | Admitting: Family

## 2014-09-30 ENCOUNTER — Other Ambulatory Visit (HOSPITAL_COMMUNITY): Payer: Medicare Other

## 2014-09-30 ENCOUNTER — Ambulatory Visit: Payer: Medicare Other | Admitting: Family

## 2014-09-30 ENCOUNTER — Encounter (HOSPITAL_COMMUNITY): Payer: Medicare Other

## 2014-09-30 DIAGNOSIS — R079 Chest pain, unspecified: Secondary | ICD-10-CM | POA: Diagnosis not present

## 2014-09-30 DIAGNOSIS — E785 Hyperlipidemia, unspecified: Secondary | ICD-10-CM | POA: Diagnosis not present

## 2014-09-30 DIAGNOSIS — I509 Heart failure, unspecified: Secondary | ICD-10-CM | POA: Diagnosis not present

## 2014-09-30 DIAGNOSIS — I10 Essential (primary) hypertension: Secondary | ICD-10-CM | POA: Diagnosis not present

## 2014-09-30 DIAGNOSIS — I429 Cardiomyopathy, unspecified: Secondary | ICD-10-CM | POA: Diagnosis not present

## 2014-10-23 DIAGNOSIS — Z79899 Other long term (current) drug therapy: Secondary | ICD-10-CM | POA: Diagnosis not present

## 2014-10-23 DIAGNOSIS — D509 Iron deficiency anemia, unspecified: Secondary | ICD-10-CM | POA: Diagnosis not present

## 2014-10-23 DIAGNOSIS — E559 Vitamin D deficiency, unspecified: Secondary | ICD-10-CM | POA: Diagnosis not present

## 2014-10-23 DIAGNOSIS — I10 Essential (primary) hypertension: Secondary | ICD-10-CM | POA: Diagnosis not present

## 2014-10-23 DIAGNOSIS — R5381 Other malaise: Secondary | ICD-10-CM | POA: Diagnosis not present

## 2014-10-23 DIAGNOSIS — D519 Vitamin B12 deficiency anemia, unspecified: Secondary | ICD-10-CM | POA: Diagnosis not present

## 2014-10-23 DIAGNOSIS — E780 Pure hypercholesterolemia: Secondary | ICD-10-CM | POA: Diagnosis not present

## 2014-10-23 DIAGNOSIS — R5383 Other fatigue: Secondary | ICD-10-CM | POA: Diagnosis not present

## 2014-10-28 ENCOUNTER — Encounter: Payer: Self-pay | Admitting: Vascular Surgery

## 2014-10-29 ENCOUNTER — Ambulatory Visit (INDEPENDENT_AMBULATORY_CARE_PROVIDER_SITE_OTHER)
Admission: RE | Admit: 2014-10-29 | Discharge: 2014-10-29 | Disposition: A | Discharge status: Home / Self Care | Payer: Medicare Other | Source: Ambulatory Visit | Attending: Vascular Surgery | Admitting: Vascular Surgery

## 2014-10-29 ENCOUNTER — Encounter: Payer: Self-pay | Admitting: Vascular Surgery

## 2014-10-29 ENCOUNTER — Ambulatory Visit (HOSPITAL_COMMUNITY)
Admission: RE | Admit: 2014-10-29 | Discharge: 2014-10-29 | Disposition: A | Discharge status: Home / Self Care | Payer: Medicare Other | Source: Ambulatory Visit | Attending: Vascular Surgery | Admitting: Vascular Surgery

## 2014-10-29 ENCOUNTER — Ambulatory Visit (INDEPENDENT_AMBULATORY_CARE_PROVIDER_SITE_OTHER): Payer: Medicare Other | Admitting: Vascular Surgery

## 2014-10-29 VITALS — BP 118/66 | HR 69 | Temp 98.2°F | Resp 16 | Ht 64.0 in | Wt 176.0 lb

## 2014-10-29 DIAGNOSIS — R202 Paresthesia of skin: Secondary | ICD-10-CM | POA: Diagnosis not present

## 2014-10-29 DIAGNOSIS — I6523 Occlusion and stenosis of bilateral carotid arteries: Secondary | ICD-10-CM

## 2014-10-29 DIAGNOSIS — R200 Anesthesia of skin: Secondary | ICD-10-CM | POA: Diagnosis not present

## 2014-10-29 DIAGNOSIS — I739 Peripheral vascular disease, unspecified: Secondary | ICD-10-CM

## 2014-10-29 DIAGNOSIS — M79609 Pain in unspecified limb: Secondary | ICD-10-CM

## 2014-10-29 NOTE — Progress Notes (Signed)
[facility] [place] HISTORY AND PHYSICAL    HPI: This is a 69 y.o. female who has a known history of bilateral carotid stenosis and peripheral arterial disease. She denies any amaurosis fugax, hemiparesis or difficulty with speech.  She denies any claudication.  She states that she has numbness and tingling in both hands, which comes and goes, but does not favor either hand.  She continues to have numbness and tingling in her feet.  She denies any non healing ulcers on her lower extremities.  She recently had a left-sided AICD placed. 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 She complains primarily of numbness along the inner aspect of her right foot. These symptoms are similar to what she has described dating back to 2014. She is on aspirin and statin. She states she has not smoked in 6 years. Other chronic medical problems include COPD, diabetes, hypertension all of which are currently stable.       Past Medical History  Diagnosis Date  . COPD (chronic obstructive pulmonary disease)   . Diabetes mellitus without complication     boarder line  . Hypertension   . Carotid artery occlusion   . CHF (congestive heart failure)   . Bell's palsy    Past Surgical History  Procedure Laterality Date  . Abdominal hysterectomy    . Cardiac defibrillator placement  Aug. 2014  . Joint replacement Bilateral     implants   Current Outpatient Prescriptions on File Prior to Visit  Medication Sig Dispense Refill  . amLODipine (NORVASC) 5 MG tablet Take 1 tablet by mouth daily.    [name] aspirin 81 MG tablet Take 81 mg by mouth daily.    [name] atorvastatin (LIPITOR) 40 MG tablet Take 1 tablet by mouth daily.    . calcium carbonate (OS-CAL) 600 MG TABS tablet Take 1,000 mg by mouth daily with breakfast.     . carvedilol (COREG) 12.5 MG tablet Take 12.5 mg by mouth 2 (two) times daily with a meal.    . Cholecalciferol (CVS VIT D 5000 HIGH-POTENCY PO) Take by mouth 2 (two) times a  week.    . Cyanocobalamin (VITAMIN B-12 PO) Take 1 tablet by mouth daily.    . digoxin (LANOXIN) 0.125 MG tablet Take by mouth 3 (three) times a week. Take 1/2 Tab.    . disopyramide (NORPACE) 100 MG capsule Take by mouth 4 (four) times daily.    . furosemide (LASIX) 20 MG tablet Take 40 mg by mouth daily.     [name] gabapentin (NEURONTIN) 300 MG capsule Take 300 mg by mouth 3 (three) times daily.    [name] lisinopril (PRINIVIL,ZESTRIL) 20 MG tablet Take 1 tablet by mouth daily.    . pravastatin (PRAVACHOL) 40 MG tablet Take 1 tablet by mouth daily.    . traZODone (DESYREL) 100 MG tablet Take 100 mg by mouth at bedtime.    . Potassium Gluconate 595 MG CAPS Take by mouth daily.     No current facility-administered medications on file prior to visit.     ROS: [x]  Positive   [ ]  Negative   [ ]  All sytems reviewed and are negative  General: [ ]  Weight loss, [ ]  Fever, [ ]  chills Neurologic: [ ]  Dizziness, [ ]  Blackouts, [ ]  Seizure [ ]  Stroke, [ ]  "Mini stroke", [ ]  Slurred speech, [ ]  Temporary blindness; [ ]  weakness in arms or legs, [ ]  Hoarseness; [X]  numbness/tingling in hands and feet Cardiac: Valu.Nieves ]  Chest pain/pressure, [ ]  Shortness of breath at rest [ ]  Shortness of breath with exertion, [ ]  Atrial fibrillation or irregular heartbeat; [x]  swelling in lower extremities; [x]  high blood pressure Vascular: [ ]  Pain in legs with walking, [ ]  Pain in legs at rest, [ ]  Pain in legs at night,  [ ]  Non-healing ulcer, [ ]  Blood clot in vein/DVT,    Pulmonary: [ ]  Home oxygen, [ ]  Productive cough, [ ]  Coughing up blood, [ ]  Asthma,  [x ] Wheezing; [x]  COPD Musculoskeletal:  [ ]  Arthritis, [ ]  Low back pain, [ ]  Joint pain Hematologic: [ ]  Easy Bruising, [ ]  Anemia; [ ]  Hepatitis Gastrointestinal: [ ]  Blood in stool, [ ]  Gastroesophageal Reflux/heartburn, [ ]  Trouble swallowing Urinary: [ ]  chronic Kidney disease, [ ]  on HD - [ ]  MWF or [ ]  TTHS, [ ]  Burning with urination, [ ]  Difficulty  urinating Endocrine: [x ] hx of diabetes, [ ]  hx of thyroid disease Skin: [ ]  Rashes, [ ]  Wounds Psychological: [ ]  Anxiety, [ ]  Depression    PHYSICAL EXAMINATION:    Filed Vitals:   10/29/14 1614 10/29/14 1618  BP: 145/65 118/66  Pulse: 66 69  Temp: 98.2 F (36.8 C)   TempSrc: Oral   Resp: 16   Height: 5\' 4"  (1.626 m)   Weight: 176 lb (79.833 kg)   SpO2: 92%     General:  WDWN in NAD HENT: WNL Eyes: PERRL Neck: Bilateral carotid bruits right greater than left Pulmonary: normal non-labored breathing , without Rales, rhonchi,  wheezing Cardiac: RRR + bilateral carotid bruits Abdomen: soft, NT, no masses Skin: no rashes, ulcers noted Vascular Exam/Pulses: femoral pulses are palpable bilaterally; her pedal pulses are absent; she has a palpable right radial pulse and the left radial pulse is absent. Extremities: without ischemic changes, no Gangrene , no cellulitis; no open wounds; mild edema BLE Musculoskeletal: no muscle wasting or atrophy       Neurologic: A&O X 3; Appropriate Affect ; SENSATION: normal; MOTOR FUNCTION:  moving all extremities equally. Speech is fluent/normal   Non-Invasive Vascular Imaging:   She had a bilateral carotid duplex today. I reviewed and interpreted this study. This showed 60-80% bilateral carotid artery stenosis,left brachial pressure was 33 mm gradient less than right just above left subclavian stenosis. She also had bilateral ABIs performed which were 0.58 on the right 0.68 which are similar over the last 2 years  ABI's 12/26/12: Right  0.55 Left 0.76  ASSESSMENT/PLAN: 69 y.o. female presents today for f/u ABI's, carotid duplex.    Patient has moderate peripheral arterial disease. However she does not really describe claudication symptoms. She has no history of rest pain or nonhealing wounds. However, she is clearly frustrated by the pain in her legs. This was well described by her daughter today.  At this point she has opted for diagnostic  arteriogram and possible intervention to see if we can improve her lower extremity symptoms. Risks benefits possible complications and procedure details were explained to the patient and her daughter today. These include but are not limited to bleeding infection vessel injury contrast reaction. She understands and agrees to proceed. This is scheduled for 11/13/2014.  Her carotid stenosis is asymptomatic. She is on maximal medical management for this.   Ruta Hinds, MD [facility] [place] Office: [phone] Pager: [phone]

## 2014-11-02 DIAGNOSIS — D510 Vitamin B12 deficiency anemia due to intrinsic factor deficiency: Secondary | ICD-10-CM | POA: Diagnosis not present

## 2014-11-03 ENCOUNTER — Other Ambulatory Visit: Payer: Self-pay

## 2014-11-11 DIAGNOSIS — D510 Vitamin B12 deficiency anemia due to intrinsic factor deficiency: Secondary | ICD-10-CM | POA: Diagnosis not present

## 2014-11-17 DIAGNOSIS — D510 Vitamin B12 deficiency anemia due to intrinsic factor deficiency: Secondary | ICD-10-CM | POA: Diagnosis not present

## 2014-11-18 DIAGNOSIS — I5022 Chronic systolic (congestive) heart failure: Secondary | ICD-10-CM | POA: Diagnosis not present

## 2014-11-19 DIAGNOSIS — I498 Other specified cardiac arrhythmias: Secondary | ICD-10-CM | POA: Diagnosis not present

## 2014-11-19 DIAGNOSIS — Z4502 Encounter for adjustment and management of automatic implantable cardiac defibrillator: Secondary | ICD-10-CM | POA: Diagnosis not present

## 2014-11-23 DIAGNOSIS — D510 Vitamin B12 deficiency anemia due to intrinsic factor deficiency: Secondary | ICD-10-CM | POA: Diagnosis not present

## 2014-11-25 DIAGNOSIS — M79606 Pain in leg, unspecified: Secondary | ICD-10-CM | POA: Diagnosis not present

## 2014-11-26 MED ORDER — SODIUM CHLORIDE 0.9 % IV SOLN
INTRAVENOUS | Status: DC
  Administered 2014-11-27: 06:00:00 via INTRAVENOUS

## 2014-11-27 ENCOUNTER — Ambulatory Visit (HOSPITAL_COMMUNITY)
Admission: RE | Admit: 2014-11-27 | Discharge: 2014-11-27 | Disposition: A | Discharge status: Home / Self Care | Payer: Medicare Other | Source: Ambulatory Visit | Attending: Vascular Surgery | Admitting: Vascular Surgery

## 2014-11-27 ENCOUNTER — Encounter (HOSPITAL_COMMUNITY)
Admission: RE | Disposition: A | Discharge status: Home / Self Care | Payer: Self-pay | Source: Ambulatory Visit | Attending: Vascular Surgery

## 2014-11-27 ENCOUNTER — Telehealth: Payer: Self-pay | Admitting: Vascular Surgery

## 2014-11-27 ENCOUNTER — Encounter (HOSPITAL_COMMUNITY): Payer: Self-pay | Admitting: Vascular Surgery

## 2014-11-27 DIAGNOSIS — I509 Heart failure, unspecified: Secondary | ICD-10-CM | POA: Diagnosis not present

## 2014-11-27 DIAGNOSIS — I6523 Occlusion and stenosis of bilateral carotid arteries: Secondary | ICD-10-CM | POA: Insufficient documentation

## 2014-11-27 DIAGNOSIS — J449 Chronic obstructive pulmonary disease, unspecified: Secondary | ICD-10-CM | POA: Diagnosis not present

## 2014-11-27 DIAGNOSIS — Z95810 Presence of automatic (implantable) cardiac defibrillator: Secondary | ICD-10-CM | POA: Diagnosis not present

## 2014-11-27 DIAGNOSIS — Z87891 Personal history of nicotine dependence: Secondary | ICD-10-CM | POA: Insufficient documentation

## 2014-11-27 DIAGNOSIS — I739 Peripheral vascular disease, unspecified: Secondary | ICD-10-CM | POA: Diagnosis present

## 2014-11-27 DIAGNOSIS — I70213 Atherosclerosis of native arteries of extremities with intermittent claudication, bilateral legs: Secondary | ICD-10-CM | POA: Diagnosis not present

## 2014-11-27 DIAGNOSIS — Z79899 Other long term (current) drug therapy: Secondary | ICD-10-CM | POA: Insufficient documentation

## 2014-11-27 DIAGNOSIS — E119 Type 2 diabetes mellitus without complications: Secondary | ICD-10-CM | POA: Insufficient documentation

## 2014-11-27 DIAGNOSIS — I10 Essential (primary) hypertension: Secondary | ICD-10-CM | POA: Insufficient documentation

## 2014-11-27 DIAGNOSIS — Z7982 Long term (current) use of aspirin: Secondary | ICD-10-CM | POA: Insufficient documentation

## 2014-11-27 HISTORY — PX: ABDOMINAL AORTAGRAM: SHX5454

## 2014-11-27 LAB — POCT I-STAT, CHEM 8
BUN: 21 mg/dL (ref 6–23)
Calcium, Ion: 1.23 mmol/L (ref 1.13–1.30)
Chloride: 102 mmol/L (ref 96–112)
Creatinine, Ser: 0.8 mg/dL (ref 0.50–1.10)
Glucose, Bld: 110 mg/dL — ABNORMAL HIGH (ref 70–99)
HEMATOCRIT: 43 % (ref 36.0–46.0)
Hemoglobin: 14.6 g/dL (ref 12.0–15.0)
POTASSIUM: 4.2 mmol/L (ref 3.5–5.1)
Sodium: 142 mmol/L (ref 135–145)
TCO2: 24 mmol/L (ref 0–100)

## 2014-11-27 LAB — POCT ACTIVATED CLOTTING TIME
ACTIVATED CLOTTING TIME: 220 s
Activated Clotting Time: 177 seconds

## 2014-11-27 SURGERY — ABDOMINAL AORTAGRAM

## 2014-11-27 MED ORDER — LABETALOL HCL 5 MG/ML IV SOLN: 10.0000 mg | INTRAVENOUS | Status: DC | PRN

## 2014-11-27 MED ORDER — MORPHINE SULFATE 10 MG/ML IJ SOLN: 2.0000 mg | INTRAMUSCULAR | Status: DC | PRN

## 2014-11-27 MED ORDER — ONDANSETRON HCL 4 MG/2ML IJ SOLN: 4.0000 mg | Freq: Four times a day (QID) | INTRAMUSCULAR | Status: DC | PRN

## 2014-11-27 MED ORDER — HEPARIN SODIUM (PORCINE) 1000 UNIT/ML IJ SOLN
INTRAMUSCULAR | Status: AC
Start: 2014-11-27 — End: 2014-11-27
  Filled 2014-11-27: qty 1

## 2014-11-27 MED ORDER — HEPARIN (PORCINE) IN NACL 2-0.9 UNIT/ML-% IJ SOLN
INTRAMUSCULAR | Status: AC
  Filled 2014-11-27: qty 1000

## 2014-11-27 MED ORDER — ACETAMINOPHEN 325 MG RE SUPP
325.0000 mg | RECTAL | Status: DC | PRN
  Filled 2014-11-27: qty 2

## 2014-11-27 MED ORDER — ACETAMINOPHEN 325 MG PO TABS
325.0000 mg | ORAL_TABLET | ORAL | Status: DC | PRN
  Administered 2014-11-27: 650 mg via ORAL
  Filled 2014-11-27 (×2): qty 2

## 2014-11-27 MED ORDER — METOPROLOL TARTRATE 1 MG/ML IV SOLN: 2.0000 mg | INTRAVENOUS | Status: DC | PRN

## 2014-11-27 MED ORDER — LIDOCAINE HCL (PF) 1 % IJ SOLN
INTRAMUSCULAR | Status: AC
  Filled 2014-11-27: qty 30

## 2014-11-27 MED ORDER — HYDRALAZINE HCL 20 MG/ML IJ SOLN: 5.0000 mg | INTRAMUSCULAR | Status: DC | PRN

## 2014-11-27 MED ORDER — SODIUM CHLORIDE 0.45 % IV SOLN
INTRAVENOUS | Status: DC
  Administered 2014-11-27: 09:00:00 via INTRAVENOUS

## 2014-11-27 MED ORDER — MORPHINE SULFATE 2 MG/ML IJ SOLN
INTRAMUSCULAR | Status: AC
  Administered 2014-11-27: 2 mg via INTRAVENOUS
  Filled 2014-11-27: qty 1

## 2014-11-27 MED ORDER — ACETAMINOPHEN 325 MG PO TABS
ORAL_TABLET | ORAL | Status: AC
  Administered 2014-11-27: 650 mg via ORAL
  Filled 2014-11-27: qty 2

## 2014-11-27 NOTE — Telephone Encounter (Signed)
I have faxed a request for cardiac clearance to Dr Ervin Knack office, dpm

## 2014-11-27 NOTE — Interval H&P Note (Signed)
History and Physical Interval Note:  11/27/2014 7:30 AM  Lisa [name]  has presented today for surgery, with the diagnosis of pad  The various methods of treatment have been discussed with the patient and family. After consideration of risks, benefits and other options for treatment, the patient has consented to  Procedure(s): ABDOMINAL AORTAGRAM (N/A) as a surgical intervention .  The patient's history has been reviewed, patient examined, no change in status, stable for surgery.  I have reviewed the patient's chart and labs.  Questions were answered to the patient's satisfaction.     FIELDS,CHARLES E

## 2014-11-27 NOTE — Telephone Encounter (Signed)
-----   Message from Mena Goes, RN sent at 11/27/2014 10:33 AM EST ----- Regarding: Cardiac clearance Please get this cardiac clearance, Colletta [place] is aware of this surgery.  ----- Message -----    From: Elam Dutch, MD    Sent: 11/27/2014   8:48 AM      To: Vvs Charge Pool  Aortogram with bilateral runoff Left groin puncture Left common iliac stent  She needs a femoral femoral bypass She needs cardiac eval by Dr Lennox Pippins in [place] prior to this. She saw him 2 weeks ago so it may just be his note if he thinks she is ok to go.  She can be put on schedule for fem fem as soon as cardiac cleared  Ruta Hinds

## 2014-11-27 NOTE — Progress Notes (Signed)
Left groin level 0 after ambulation to bathroom

## 2014-11-27 NOTE — Op Note (Signed)
Procedure: Aortogram with bilateral lower extremity runoff, left common iliac stent (8 x 18 Cordis Genesis), post dilatation to 9 mm  Preoperative diagnosis: Claudication  Postoperative diagnosis: Same  Anesthesia: Local  Operative findings: #1 occlusion right common iliac artery                                 #2  90% stenosis left common iliac artery stented 2 residual of 0% stenosis                                 #3 intact runoff to bilateral lower extremities from external iliac to the foot with three-vessel runoff  Operative details: After obtaining informed consent, the patient was taken to the [place] lab. The patient was placed in supine position on the Angio table. Both groins were prepped and draped in usual sterile fashion. Local anesthesia was inserted of the left common femoral artery. Ultrasound was used to identify the left common femoral artery. However it was difficult to obtain a good window to puncture the artery under ultrasound guidance. Therefore I palpated the left common femoral artery was able to cannulate this on direct palpation. An 035 versacore wire was threaded up into the abdominal aorta under fluoroscopic guidance. A 5 French sheath was placed over the guidewire and the left common femoral artery. This was thoroughly flushed with heparinized saline. The 5 French pigtail catheter was brought up in the operative field was advanced up the abdominal aorta under fluoroscopic guidance the guidewire was removed and an infrarenal abdominal aortogram was obtained through the pigtail catheter. This shows left and right renal arteries are patent. The infrarenal abdominal aorta is patent. The right common iliac artery is occluded at its origin. Left common iliac artery has a high-grade greater than 90% stenosis at its origin. The distal right common iliac artery does reconstitute from large lumbar collaterals as well as the inferior mesenteric artery. The right external iliac and right  internal iliac arteries are patent and fill retrograde. The left external and internal iliac arteries are patent. Oblique views of the pelvis were performed to confirm these above findings. Next bilateral lower extremity runoff views were obtained through the pigtail catheter.  In the right lower extremity, the right external iliac artery common femoral profunda femoris and superficial femoral arteries are widely patent. The popliteal anterior tibial posterior tibial and peroneal arteries are patent. There is delayed filling of the right lower extremity secondary to the iliac artery occlusion.  In the left lower extremity, the left external iliac and left common femoral left profunda femoris left superficial femoral arteries are all patent. The left popliteal anterior tibial posterior tibial and peroneal arteries are all patent.  At this point it was decided to intervene on the left common iliac artery stenosis with the intent of using this as an inflow for a left to right femoral-femoral bypass. The pigtail catheter was removed over the guidewire. The 5 French sheath was exchanged over the guidewire for a long 7 French bright tip sheath. The patient was given 8000 units of heparin. ACT was greater than 220. Using roadmapping the large lumbar collateral and the inferior mesenteric artery were marked as well as the level of the lesion. An 8 x 18 Genesis balloon expandable stent was brought up in the operative field and centered on the lesion. This was then  deployed to 8 atm for 1 minute. Completion arteriogram was performed which still showed some scalloping on the medial segment of the stent. Therefore a 9 x 2 mm balloon was brought up in the operative field. This was advanced to the center of the stent and then inflated to 8 atm for 40 seconds. Placed arteriogram was performed which showed no evidence of dissection. The stent was widely patent. There was still some calcification on the medial wall but I did  not feel that this was going to be improved by any further balloon dilations. Residual stenosis was essentially 0%. At this point the sheath was pulled down in the left hemipelvis. The guidewire was removed. The sheath was thoroughly flushed with heparinized saline. The patient was taken to the holding area to have the sheath removed after the ACT is less than 175.  The patient tolerated the procedure well and there were no complications. The patient was taken to the holding area in stable condition.  Operative management: The patient will be scheduled for cardiac risk stratification in the near future followed by a left-to-right femoral-femoral bypass.  Ruta Hinds, MD [facility] [place] Office: [phone] Pager: [phone]

## 2014-11-27 NOTE — Discharge Instructions (Signed)

## 2014-11-27 NOTE — Progress Notes (Signed)
Site area: lt groin Site Prior to Removal:  Level  0 Pressure Applied For: 20 minutes Manual:  yes  Patient Status During Pull:  stable Post Pull Site:  Level 0 Post Pull Instructions Given:  yes Post Pull Pulses Present: yes Dressing Applied:  tegaderm Bedrest begins @ 1005 Comments: no complications

## 2014-11-27 NOTE — H&P (View-Only) (Signed)
[facility] [place] HISTORY AND PHYSICAL    HPI: This is a 69 y.o. female who has a known history of bilateral carotid stenosis and peripheral arterial disease. She denies any amaurosis fugax, hemiparesis or difficulty with speech.  She denies any claudication.  She states that she has numbness and tingling in both hands, which comes and goes, but does not favor either hand.  She continues to have numbness and tingling in her feet.  She denies any non healing ulcers on her lower extremities.  She recently had a left-sided AICD placed. 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 She complains primarily of numbness along the inner aspect of her right foot. These symptoms are similar to what she has described dating back to 2014. She is on aspirin and statin. She states she has not smoked in 6 years. Other chronic medical problems include COPD, diabetes, hypertension all of which are currently stable.       Past Medical History  Diagnosis Date  . COPD (chronic obstructive pulmonary disease)   . Diabetes mellitus without complication     boarder line  . Hypertension   . Carotid artery occlusion   . CHF (congestive heart failure)   . Bell's palsy    Past Surgical History  Procedure Laterality Date  . Abdominal hysterectomy    . Cardiac defibrillator placement  Aug. 2014  . Joint replacement Bilateral     implants   Current Outpatient Prescriptions on File Prior to Visit  Medication Sig Dispense Refill  . amLODipine (NORVASC) 5 MG tablet Take 1 tablet by mouth daily.    [name] aspirin 81 MG tablet Take 81 mg by mouth daily.    [name] atorvastatin (LIPITOR) 40 MG tablet Take 1 tablet by mouth daily.    . calcium carbonate (OS-CAL) 600 MG TABS tablet Take 1,000 mg by mouth daily with breakfast.     . carvedilol (COREG) 12.5 MG tablet Take 12.5 mg by mouth 2 (two) times daily with a meal.    . Cholecalciferol ([employer] VIT D 5000 HIGH-POTENCY PO) Take by mouth 2 (two) times a  week.    . Cyanocobalamin (VITAMIN B-12 PO) Take 1 tablet by mouth daily.    . digoxin (LANOXIN) 0.125 MG tablet Take by mouth 3 (three) times a week. Take 1/2 Tab.    . disopyramide (NORPACE) 100 MG capsule Take by mouth 4 (four) times daily.    . furosemide (LASIX) 20 MG tablet Take 40 mg by mouth daily.     [name] gabapentin (NEURONTIN) 300 MG capsule Take 300 mg by mouth 3 (three) times daily.    [name] lisinopril (PRINIVIL,ZESTRIL) 20 MG tablet Take 1 tablet by mouth daily.    . pravastatin (PRAVACHOL) 40 MG tablet Take 1 tablet by mouth daily.    . traZODone (DESYREL) 100 MG tablet Take 100 mg by mouth at bedtime.    . Potassium Gluconate 595 MG CAPS Take by mouth daily.     No current facility-administered medications on file prior to visit.     ROS: [x]  Positive   [ ]  Negative   [ ]  All sytems reviewed and are negative  General: [ ]  Weight loss, [ ]  Fever, [ ]  chills Neurologic: [ ]  Dizziness, [ ]  Blackouts, [ ]  Seizure [ ]  Stroke, [ ]  "Mini stroke", [ ]  Slurred speech, [ ]  Temporary blindness; [ ]  weakness in arms or legs, [ ]  Hoarseness; [X]  numbness/tingling in hands and feet Cardiac: Valu.Nieves ]  Chest pain/pressure, [ ]  Shortness of breath at rest [ ]  Shortness of breath with exertion, [ ]  Atrial fibrillation or irregular heartbeat; [x]  swelling in lower extremities; [x]  high blood pressure Vascular: [ ]  Pain in legs with walking, [ ]  Pain in legs at rest, [ ]  Pain in legs at night,  [ ]  Non-healing ulcer, [ ]  Blood clot in vein/DVT,    Pulmonary: [ ]  Home oxygen, [ ]  Productive cough, [ ]  Coughing up blood, [ ]  Asthma,  [x ] Wheezing; [x]  COPD Musculoskeletal:  [ ]  Arthritis, [ ]  Low back pain, [ ]  Joint pain Hematologic: [ ]  Easy Bruising, [ ]  Anemia; [ ]  Hepatitis Gastrointestinal: [ ]  Blood in stool, [ ]  Gastroesophageal Reflux/heartburn, [ ]  Trouble swallowing Urinary: [ ]  chronic Kidney disease, [ ]  on HD - [ ]  MWF or [ ]  TTHS, [ ]  Burning with urination, [ ]  Difficulty  urinating Endocrine: [x ] hx of diabetes, [ ]  hx of thyroid disease Skin: [ ]  Rashes, [ ]  Wounds Psychological: [ ]  Anxiety, [ ]  Depression    PHYSICAL EXAMINATION:    Filed Vitals:   10/29/14 1614 10/29/14 1618  BP: 145/65 118/66  Pulse: 66 69  Temp: 98.2 F (36.8 C)   TempSrc: Oral   Resp: 16   Height: 5\' 4"  (1.626 m)   Weight: 176 lb (79.833 kg)   SpO2: 92%     General:  WDWN in NAD HENT: WNL Eyes: PERRL Neck: Bilateral carotid bruits right greater than left Pulmonary: normal non-labored breathing , without Rales, rhonchi,  wheezing Cardiac: RRR + bilateral carotid bruits Abdomen: soft, NT, no masses Skin: no rashes, ulcers noted Vascular Exam/Pulses: femoral pulses are palpable bilaterally; her pedal pulses are absent; she has a palpable right radial pulse and the left radial pulse is absent. Extremities: without ischemic changes, no Gangrene , no cellulitis; no open wounds; mild edema BLE Musculoskeletal: no muscle wasting or atrophy       Neurologic: A&O X 3; Appropriate Affect ; SENSATION: normal; MOTOR FUNCTION:  moving all extremities equally. Speech is fluent/normal   Non-Invasive Vascular Imaging:   She had a bilateral carotid duplex today. I reviewed and interpreted this study. This showed 60-80% bilateral carotid artery stenosis,left brachial pressure was 33 mm gradient less than right just above left subclavian stenosis. She also had bilateral ABIs performed which were 0.58 on the right 0.68 which are similar over the last 2 years  ABI's 12/26/12: Right  0.55 Left 0.76  ASSESSMENT/PLAN: 69 y.o. female presents today for f/u ABI's, carotid duplex.    Patient has moderate peripheral arterial disease. However she does not really describe claudication symptoms. She has no history of rest pain or nonhealing wounds. However, she is clearly frustrated by the pain in her legs. This was well described by her daughter today.  At this point she has opted for diagnostic  arteriogram and possible intervention to see if we can improve her lower extremity symptoms. Risks benefits possible complications and procedure details were explained to the patient and her daughter today. These include but are not limited to bleeding infection vessel injury contrast reaction. She understands and agrees to proceed. This is scheduled for 11/13/2014.  Her carotid stenosis is asymptomatic. She is on maximal medical management for this.   Ruta Hinds, MD [facility] [place] Office: [phone] Pager: [phone]

## 2014-12-01 DIAGNOSIS — I739 Peripheral vascular disease, unspecified: Secondary | ICD-10-CM | POA: Diagnosis not present

## 2014-12-01 DIAGNOSIS — Z1389 Encounter for screening for other disorder: Secondary | ICD-10-CM | POA: Diagnosis not present

## 2014-12-01 DIAGNOSIS — G629 Polyneuropathy, unspecified: Secondary | ICD-10-CM | POA: Diagnosis not present

## 2014-12-01 DIAGNOSIS — Z9181 History of falling: Secondary | ICD-10-CM | POA: Diagnosis not present

## 2014-12-02 ENCOUNTER — Other Ambulatory Visit: Payer: Self-pay

## 2014-12-02 NOTE — Telephone Encounter (Signed)
Per Col[place] [place]- pt is scheduled to see Dr Lennox Pippins on 03/14. dpm

## 2014-12-07 DIAGNOSIS — Z01810 Encounter for preprocedural cardiovascular examination: Secondary | ICD-10-CM | POA: Diagnosis not present

## 2014-12-07 DIAGNOSIS — I2510 Atherosclerotic heart disease of native coronary artery without angina pectoris: Secondary | ICD-10-CM | POA: Diagnosis not present

## 2014-12-08 DIAGNOSIS — I509 Heart failure, unspecified: Secondary | ICD-10-CM | POA: Diagnosis not present

## 2014-12-08 DIAGNOSIS — I350 Nonrheumatic aortic (valve) stenosis: Secondary | ICD-10-CM | POA: Diagnosis not present

## 2014-12-08 DIAGNOSIS — Z01810 Encounter for preprocedural cardiovascular examination: Secondary | ICD-10-CM | POA: Diagnosis not present

## 2014-12-08 DIAGNOSIS — I340 Nonrheumatic mitral (valve) insufficiency: Secondary | ICD-10-CM | POA: Diagnosis not present

## 2014-12-10 ENCOUNTER — Encounter (HOSPITAL_COMMUNITY): Payer: Self-pay

## 2014-12-10 NOTE — Progress Notes (Addendum)
Anesthesia Chart Review:  Patient is a 69 year old female scheduled for left to right FFBG on 12/21/14 by Dr. Oneida Alar. PAT is scheduled for 12/11/14.  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 Mahala Menghini), CHF, former smoker, COPD, DM2, HTN, Bell's Palsy.  Cardiologist is Dr. Geraldo Pitter with [facility].  He cleared patient with moderate CV risk.    12/08/14 nuclear stress test: Dilated LV with diffuse hypokinesis and markedly depressed LVEF of 27%. No segmental perfusion defect, rest or vasodilator stress. Negative vasodilator stress for potential ischemia or prior MI.  12/08/14 Echo: #1) Dilated LV with diffuse hypokinesis, markedly depressed LVEF 25-30%. Impaired relaxation. #2) 2+ moderate MR secondary to #1, with mild to moderate LA dilatation. #3) Grossly normal right heart size/function, pacing catheter in situ, borderline elevation of PA pressure. #4) Mild aortic sclerosis, good mobility.   02/20/13 cardiac cath ([facility]): Normal coronaries. Moderate LV dysfunction, LVEF 35%. Medical therapy for LV dysfunction.   EKG on 11/27/14: SB at 59 bpm, first degree AVB, LAFB, incomplete right BBB, LVH, anteroseptal infarct (age undetermined).  10/29/14 carotid duplex: Bilateral ICA stenosis in the 60-79% range. Abnormal retrograde left vertebral artery with brachial pressure gradient present may suggest subclavian steal syndrome.   She is for labs at her PAT appointment.  George Hugh [facility] Short Stay Center/Anesthesiology Phone [phone] 12/10/2014 5:56 PM   Addendum: PAT labs acceptable. ICD perioperative device form faxed to [facility] per PAT staff.    George Hugh [facility] Short Stay Center/Anesthesiology Phone [phone] 12/11/2014 4:27 PM

## 2014-12-11 ENCOUNTER — Encounter (HOSPITAL_COMMUNITY)
Admission: RE | Admit: 2014-12-11 | Discharge: 2014-12-11 | Disposition: A | Discharge status: Home / Self Care | Payer: Medicare Other | Source: Ambulatory Visit | Attending: Vascular Surgery | Admitting: Vascular Surgery

## 2014-12-11 ENCOUNTER — Encounter (HOSPITAL_COMMUNITY): Payer: Self-pay

## 2014-12-11 DIAGNOSIS — M13862 Other specified arthritis, left knee: Secondary | ICD-10-CM | POA: Diagnosis not present

## 2014-12-11 DIAGNOSIS — K219 Gastro-esophageal reflux disease without esophagitis: Secondary | ICD-10-CM | POA: Diagnosis not present

## 2014-12-11 DIAGNOSIS — I70219 Atherosclerosis of native arteries of extremities with intermittent claudication, unspecified extremity: Secondary | ICD-10-CM | POA: Diagnosis not present

## 2014-12-11 DIAGNOSIS — Z87891 Personal history of nicotine dependence: Secondary | ICD-10-CM | POA: Diagnosis not present

## 2014-12-11 DIAGNOSIS — I429 Cardiomyopathy, unspecified: Secondary | ICD-10-CM | POA: Diagnosis not present

## 2014-12-11 DIAGNOSIS — J449 Chronic obstructive pulmonary disease, unspecified: Secondary | ICD-10-CM | POA: Diagnosis not present

## 2014-12-11 DIAGNOSIS — I70211 Atherosclerosis of native arteries of extremities with intermittent claudication, right leg: Secondary | ICD-10-CM | POA: Diagnosis not present

## 2014-12-11 DIAGNOSIS — I6523 Occlusion and stenosis of bilateral carotid arteries: Secondary | ICD-10-CM | POA: Diagnosis not present

## 2014-12-11 DIAGNOSIS — Z95810 Presence of automatic (implantable) cardiac defibrillator: Secondary | ICD-10-CM | POA: Diagnosis not present

## 2014-12-11 DIAGNOSIS — M170 Bilateral primary osteoarthritis of knee: Secondary | ICD-10-CM | POA: Diagnosis not present

## 2014-12-11 DIAGNOSIS — D510 Vitamin B12 deficiency anemia due to intrinsic factor deficiency: Secondary | ICD-10-CM | POA: Diagnosis not present

## 2014-12-11 DIAGNOSIS — M13861 Other specified arthritis, right knee: Secondary | ICD-10-CM | POA: Diagnosis not present

## 2014-12-11 DIAGNOSIS — E119 Type 2 diabetes mellitus without complications: Secondary | ICD-10-CM | POA: Diagnosis not present

## 2014-12-11 DIAGNOSIS — I10 Essential (primary) hypertension: Secondary | ICD-10-CM | POA: Diagnosis not present

## 2014-12-11 DIAGNOSIS — R209 Unspecified disturbances of skin sensation: Secondary | ICD-10-CM | POA: Diagnosis not present

## 2014-12-11 DIAGNOSIS — I509 Heart failure, unspecified: Secondary | ICD-10-CM | POA: Diagnosis not present

## 2014-12-11 DIAGNOSIS — D62 Acute posthemorrhagic anemia: Secondary | ICD-10-CM | POA: Diagnosis not present

## 2014-12-11 HISTORY — DX: Other cardiomyopathies: I42.8

## 2014-12-11 HISTORY — DX: Gastro-esophageal reflux disease without esophagitis: K21.9

## 2014-12-11 HISTORY — DX: Unspecified osteoarthritis, unspecified site: M19.90

## 2014-12-11 HISTORY — DX: Presence of automatic (implantable) cardiac defibrillator: Z95.810

## 2014-12-11 HISTORY — DX: Pneumonia, unspecified organism: J18.9

## 2014-12-11 LAB — COMPREHENSIVE METABOLIC PANEL
ALT: 15 U/L (ref 0–35)
AST: 20 U/L (ref 0–37)
Albumin: 4.2 g/dL (ref 3.5–5.2)
Alkaline Phosphatase: 79 U/L (ref 39–117)
Anion gap: 10 (ref 5–15)
BILIRUBIN TOTAL: 0.9 mg/dL (ref 0.3–1.2)
BUN: 17 mg/dL (ref 6–23)
CHLORIDE: 98 mmol/L (ref 96–112)
CO2: 30 mmol/L (ref 19–32)
CREATININE: 1.02 mg/dL (ref 0.50–1.10)
Calcium: 10.2 mg/dL (ref 8.4–10.5)
GFR, EST AFRICAN AMERICAN: 64 mL/min — AB (ref >90)
GFR, EST NON AFRICAN AMERICAN: 56 mL/min — AB (ref >90)
Glucose, Bld: 105 mg/dL — ABNORMAL HIGH (ref 70–99)
Potassium: 4.8 mmol/L (ref 3.5–5.1)
Sodium: 138 mmol/L (ref 135–145)
Total Protein: 8 g/dL (ref 6.0–8.3)

## 2014-12-11 LAB — PROTIME-INR
INR: 1.04 (ref 0.00–1.49)
PROTHROMBIN TIME: 13.7 s (ref 11.6–15.2)

## 2014-12-11 LAB — URINALYSIS, ROUTINE W REFLEX MICROSCOPIC
Bilirubin Urine: NEGATIVE
Glucose, UA: NEGATIVE mg/dL
Hgb urine dipstick: NEGATIVE
KETONES UR: NEGATIVE mg/dL
Leukocytes, UA: NEGATIVE
Nitrite: NEGATIVE
Protein, ur: NEGATIVE mg/dL
SPECIFIC GRAVITY, URINE: 1.007 (ref 1.005–1.030)
Urobilinogen, UA: 0.2 mg/dL (ref 0.0–1.0)
pH: 5.5 (ref 5.0–8.0)

## 2014-12-11 LAB — TYPE AND SCREEN
ABO/RH(D): O NEG
ANTIBODY SCREEN: NEGATIVE

## 2014-12-11 LAB — CBC
HEMATOCRIT: 38.8 % (ref 36.0–46.0)
Hemoglobin: 12.6 g/dL (ref 12.0–15.0)
MCH: 30.6 pg (ref 26.0–34.0)
MCHC: 32.5 g/dL (ref 30.0–36.0)
MCV: 94.2 fL (ref 78.0–100.0)
Platelets: 329 10*3/uL (ref 150–400)
RBC: 4.12 MIL/uL (ref 3.87–5.11)
RDW: 14.6 % (ref 11.5–15.5)
WBC: 5.7 10*3/uL (ref 4.0–10.5)

## 2014-12-11 LAB — ABO/RH: ABO/RH(D): O NEG

## 2014-12-11 LAB — APTT: aPTT: 32 seconds (ref 24–37)

## 2014-12-11 LAB — SURGICAL PCR SCREEN
MRSA, PCR: NEGATIVE
Staphylococcus aureus: NEGATIVE

## 2014-12-11 NOTE — Progress Notes (Signed)
Call to Pharm. Tech to complete med. Rec.

## 2014-12-11 NOTE — Progress Notes (Signed)
Fax made to [employer], [place] for ICD order

## 2014-12-11 NOTE — Pre-Procedure Instructions (Signed)
Lisa [name]  12/11/2014   Your procedure is scheduled on:  12/21/2014  Report to [facility] Admitting at 5:30 AM.  Call this number if you have problems the morning of surgery: [phone]   Remember:   Do not eat food or drink liquids after midnight. On Sunday NIGHT  Take these medicines the morning of surgery with A SIP OF WATER: Carvedilol, Digoxin, Gabapentin, Advair   Do not wear jewelry, make-up or nail polish.   Do not wear lotions, powders, or perfumes. You may wear deodorant.   Do not shave 48 hours prior to surgery.    Do not bring valuables to the hospital.  [facility] is not responsible                  for any belongings or valuables.                Contacts, dentures or bridgework may not be worn into surgery.   Leave suitcase in the car. After surgery it may be brought to your room.   For patients admitted to the hospital, discharge time is determined by your                treatment team.               Patients discharged the day of surgery will not be allowed to drive  home.  Name and phone number of your driver: with family  Special Instructions: Special Instructions: [place] - Preparing for Surgery  Before surgery, you can play an important role.  Because skin is not sterile, your skin needs to be as free of germs as possible.  You can reduce the number of germs on you skin by washing with CHG (chlorahexidine gluconate) soap before surgery.  CHG is an antiseptic cleaner which kills germs and bonds with the skin to continue killing germs even after washing.  Please DO NOT use if you have an allergy to CHG or antibacterial soaps.  If your skin becomes reddened/irritated stop using the CHG and inform your nurse when you arrive at Short Stay.  Do not shave (including legs and underarms) for at least 48 hours prior to the first CHG shower.  You may shave your face.  Please follow these instructions carefully:   1.  Shower with CHG Soap the  night before surgery and the  morning of Surgery.  2.  If you choose to wash your hair, wash your hair first as usual with your  normal shampoo.  3.  After you shampoo, rinse your hair and body thoroughly to remove the  Shampoo.  4.  Use CHG as you would any other liquid soap.  You can apply chg directly to the skin and wash gently with scrungie or a clean washcloth.  5.  Apply the CHG Soap to your body ONLY FROM THE NECK DOWN.    Do not use on open wounds or open sores.  Avoid contact with your eyes, ears, mouth and genitals (private parts).  Wash genitals (private parts)   with your normal soap.  6.  Wash thoroughly, paying special attention to the area where your surgery will be performed.  7.  Thoroughly rinse your body with warm water from the neck down.  8.  DO NOT shower/wash with your normal soap after using and rinsing off   the CHG Soap.  9.  Pat yourself dry with a clean towel.  10.  Wear clean pajamas.            11.  Place clean sheets on your bed the night of your first shower and do not sleep with pets.  Day of Surgery  Do not apply any lotions/deodorants the morning of surgery.  Please wear clean clothes to the hospital/surgery center.   Please read over the following fact sheets that you were given: Pain Booklet, Coughing and Deep Breathing, Blood Transfusion Information, MRSA Information and Surgical Site Infection Prevention

## 2014-12-15 DIAGNOSIS — D510 Vitamin B12 deficiency anemia due to intrinsic factor deficiency: Secondary | ICD-10-CM | POA: Diagnosis not present

## 2014-12-20 MED ORDER — DEXTROSE 5 % IV SOLN
1.5000 g | INTRAVENOUS | Status: AC
  Administered 2014-12-21: 1.5 g via INTRAVENOUS
  Filled 2014-12-20: qty 1.5

## 2014-12-20 NOTE — Anesthesia Preprocedure Evaluation (Addendum)
Anesthesia Evaluation  Patient identified by MRN, date of birth, ID band Patient awake    Reviewed: Allergy & Precautions, NPO status , Patient's Chart, lab work & pertinent test results, reviewed documented beta blocker date and time   History of Anesthesia Complications Negative for: history of anesthetic complications  Airway Mallampati: II  TM Distance: >3 FB Neck ROM: Full    Dental no notable dental hx. (+) Edentulous Upper, Edentulous Lower, Dental Advisory Given   Pulmonary neg pulmonary ROS, shortness of breath, pneumonia -, COPD COPD inhaler, former smoker,  breath sounds clear to auscultation  Pulmonary exam normal       Cardiovascular hypertension, Pt. on medications and Pt. on home beta blockers + Peripheral Vascular Disease and +CHF + Cardiac Defibrillator Rhythm:Regular Rate:Normal     Neuro/Psych negative neurological ROS  negative psych ROS   GI/Hepatic Neg liver ROS, GERD-  ,  Endo/Other  diabetes, Type 2Morbid obesity  Renal/GU negative Renal ROS     Musculoskeletal  (+) Arthritis -,   Abdominal   Peds  Hematology negative hematology ROS (+)   Anesthesia Other Findings   Reproductive/Obstetrics negative OB ROS                           Anesthesia Physical Anesthesia Plan  ASA: III  Anesthesia Plan: General   Post-op Pain Management:    Induction: Intravenous  Airway Management Planned: Oral ETT  Additional Equipment: Arterial line  Intra-op Plan:   Post-operative Plan: Extubation in OR  Informed Consent: I have reviewed the patients History and Physical, chart, labs and discussed the procedure including the risks, benefits and alternatives for the proposed anesthesia with the patient or authorized representative who has indicated his/her understanding and acceptance.   Dental advisory given  Plan Discussed with: CRNA  Anesthesia Plan Comments: (2x PIV,  +/- CVL)        Anesthesia Quick Evaluation

## 2014-12-21 ENCOUNTER — Inpatient Hospital Stay (HOSPITAL_COMMUNITY): Payer: Medicare Other | Admitting: Anesthesiology

## 2014-12-21 ENCOUNTER — Inpatient Hospital Stay (HOSPITAL_COMMUNITY): Payer: Medicare Other

## 2014-12-21 ENCOUNTER — Encounter (HOSPITAL_COMMUNITY)
Admission: RE | Disposition: A | Discharge status: Home / Self Care | Payer: Self-pay | Source: Ambulatory Visit | Attending: Vascular Surgery

## 2014-12-21 ENCOUNTER — Inpatient Hospital Stay (HOSPITAL_COMMUNITY): Payer: Medicare Other | Admitting: Vascular Surgery

## 2014-12-21 ENCOUNTER — Inpatient Hospital Stay (HOSPITAL_COMMUNITY)
Admission: RE | Admit: 2014-12-21 | Discharge: 2014-12-24 | DRG: 253 | Disposition: A | Discharge status: Home / Self Care | Payer: Medicare Other | Source: Ambulatory Visit | Attending: Vascular Surgery | Admitting: Vascular Surgery

## 2014-12-21 ENCOUNTER — Encounter (HOSPITAL_COMMUNITY): Payer: Self-pay | Admitting: *Deleted

## 2014-12-21 DIAGNOSIS — M13861 Other specified arthritis, right knee: Secondary | ICD-10-CM | POA: Diagnosis present

## 2014-12-21 DIAGNOSIS — Z87891 Personal history of nicotine dependence: Secondary | ICD-10-CM

## 2014-12-21 DIAGNOSIS — I429 Cardiomyopathy, unspecified: Secondary | ICD-10-CM | POA: Diagnosis not present

## 2014-12-21 DIAGNOSIS — R0602 Shortness of breath: Secondary | ICD-10-CM | POA: Diagnosis not present

## 2014-12-21 DIAGNOSIS — K219 Gastro-esophageal reflux disease without esophagitis: Secondary | ICD-10-CM | POA: Diagnosis not present

## 2014-12-21 DIAGNOSIS — I509 Heart failure, unspecified: Secondary | ICD-10-CM | POA: Diagnosis not present

## 2014-12-21 DIAGNOSIS — R918 Other nonspecific abnormal finding of lung field: Secondary | ICD-10-CM | POA: Diagnosis not present

## 2014-12-21 DIAGNOSIS — J449 Chronic obstructive pulmonary disease, unspecified: Secondary | ICD-10-CM | POA: Diagnosis not present

## 2014-12-21 DIAGNOSIS — M79671 Pain in right foot: Secondary | ICD-10-CM

## 2014-12-21 DIAGNOSIS — M170 Bilateral primary osteoarthritis of knee: Secondary | ICD-10-CM | POA: Diagnosis present

## 2014-12-21 DIAGNOSIS — I10 Essential (primary) hypertension: Secondary | ICD-10-CM | POA: Diagnosis present

## 2014-12-21 DIAGNOSIS — Z95810 Presence of automatic (implantable) cardiac defibrillator: Secondary | ICD-10-CM | POA: Diagnosis not present

## 2014-12-21 DIAGNOSIS — I7092 Chronic total occlusion of artery of the extremities: Secondary | ICD-10-CM | POA: Diagnosis not present

## 2014-12-21 DIAGNOSIS — M13862 Other specified arthritis, left knee: Secondary | ICD-10-CM | POA: Diagnosis not present

## 2014-12-21 DIAGNOSIS — Z452 Encounter for adjustment and management of vascular access device: Secondary | ICD-10-CM

## 2014-12-21 DIAGNOSIS — R209 Unspecified disturbances of skin sensation: Secondary | ICD-10-CM | POA: Diagnosis not present

## 2014-12-21 DIAGNOSIS — E119 Type 2 diabetes mellitus without complications: Secondary | ICD-10-CM | POA: Diagnosis present

## 2014-12-21 DIAGNOSIS — I70211 Atherosclerosis of native arteries of extremities with intermittent claudication, right leg: Secondary | ICD-10-CM | POA: Diagnosis not present

## 2014-12-21 DIAGNOSIS — I6523 Occlusion and stenosis of bilateral carotid arteries: Secondary | ICD-10-CM | POA: Diagnosis not present

## 2014-12-21 DIAGNOSIS — I70219 Atherosclerosis of native arteries of extremities with intermittent claudication, unspecified extremity: Secondary | ICD-10-CM | POA: Diagnosis present

## 2014-12-21 DIAGNOSIS — D62 Acute posthemorrhagic anemia: Secondary | ICD-10-CM | POA: Diagnosis not present

## 2014-12-21 HISTORY — PX: ENDARTERECTOMY FEMORAL: SHX5804

## 2014-12-21 HISTORY — PX: FEMORAL-FEMORAL BYPASS GRAFT: SHX936

## 2014-12-21 LAB — CBC
HCT: 33.3 % — ABNORMAL LOW (ref 36.0–46.0)
Hemoglobin: 10.8 g/dL — ABNORMAL LOW (ref 12.0–15.0)
MCH: 30.3 pg (ref 26.0–34.0)
MCHC: 32.4 g/dL (ref 30.0–36.0)
MCV: 93.3 fL (ref 78.0–100.0)
Platelets: 269 10*3/uL (ref 150–400)
RBC: 3.57 MIL/uL — ABNORMAL LOW (ref 3.87–5.11)
RDW: 14.4 % (ref 11.5–15.5)
WBC: 9.2 10*3/uL (ref 4.0–10.5)

## 2014-12-21 LAB — GLUCOSE, CAPILLARY
GLUCOSE-CAPILLARY: 109 mg/dL — AB (ref 70–99)
GLUCOSE-CAPILLARY: 169 mg/dL — AB (ref 70–99)

## 2014-12-21 LAB — CREATININE, SERUM
CREATININE: 0.79 mg/dL (ref 0.50–1.10)
GFR calc Af Amer: >90 mL/min (ref >90)
GFR, EST NON AFRICAN AMERICAN: 84 mL/min — AB (ref >90)

## 2014-12-21 SURGERY — CREATION, BYPASS, ARTERIAL, FEMORAL TO FEMORAL, USING GRAFT
Anesthesia: General | Site: Groin | Laterality: Right

## 2014-12-21 MED ORDER — METOPROLOL TARTRATE 1 MG/ML IV SOLN: 2.0000 mg | INTRAVENOUS | Status: DC | PRN

## 2014-12-21 MED ORDER — ROCURONIUM BROMIDE 50 MG/5ML IV SOLN
INTRAVENOUS | Status: AC
  Filled 2014-12-21: qty 1

## 2014-12-21 MED ORDER — ENOXAPARIN SODIUM 40 MG/0.4ML ~~LOC~~ SOLN
40.0000 mg | SUBCUTANEOUS | Status: DC
  Administered 2014-12-22 – 2014-12-23 (×2): 40 mg via SUBCUTANEOUS
  Filled 2014-12-21 (×3): qty 0.4

## 2014-12-21 MED ORDER — STERILE WATER FOR INJECTION IJ SOLN
INTRAMUSCULAR | Status: AC
  Filled 2014-12-21: qty 10

## 2014-12-21 MED ORDER — MIDAZOLAM HCL 5 MG/5ML IJ SOLN
INTRAMUSCULAR | Status: DC | PRN
  Administered 2014-12-21: 2 mg via INTRAVENOUS

## 2014-12-21 MED ORDER — PROPOFOL 10 MG/ML IV BOLUS
INTRAVENOUS | Status: AC
  Filled 2014-12-21: qty 20

## 2014-12-21 MED ORDER — PHENYLEPHRINE HCL 10 MG/ML IJ SOLN
INTRAMUSCULAR | Status: DC | PRN
  Administered 2014-12-21: 80 ug via INTRAVENOUS
  Administered 2014-12-21: 40 ug via INTRAVENOUS

## 2014-12-21 MED ORDER — PROPOFOL 10 MG/ML IV BOLUS
INTRAVENOUS | Status: DC | PRN
  Administered 2014-12-21: 100 mg via INTRAVENOUS

## 2014-12-21 MED ORDER — HYDROMORPHONE HCL 1 MG/ML IJ SOLN
0.5000 mg | INTRAMUSCULAR | Status: DC | PRN
  Administered 2014-12-21 – 2014-12-23 (×4): 0.5 mg via INTRAVENOUS
  Filled 2014-12-21 (×5): qty 1

## 2014-12-21 MED ORDER — SODIUM CHLORIDE 0.9 % IR SOLN
Status: DC | PRN
  Administered 2014-12-21: 08:00:00

## 2014-12-21 MED ORDER — HEPARIN SODIUM (PORCINE) 1000 UNIT/ML IJ SOLN
INTRAMUSCULAR | Status: AC
  Filled 2014-12-21: qty 2

## 2014-12-21 MED ORDER — MIDAZOLAM HCL 2 MG/2ML IJ SOLN
INTRAMUSCULAR | Status: AC
  Filled 2014-12-21: qty 2

## 2014-12-21 MED ORDER — DIGOXIN 0.0625 MG HALF TABLET
0.0625 mg | ORAL_TABLET | ORAL | Status: DC
  Administered 2014-12-23: 0.0625 mg via ORAL
  Filled 2014-12-21 (×3): qty 1

## 2014-12-21 MED ORDER — HEPARIN SODIUM (PORCINE) 1000 UNIT/ML IJ SOLN
INTRAMUSCULAR | Status: AC
  Filled 2014-12-21: qty 1

## 2014-12-21 MED ORDER — SODIUM CHLORIDE 0.9 % IV SOLN: INTRAVENOUS | Status: DC

## 2014-12-21 MED ORDER — FENTANYL CITRATE 0.05 MG/ML IJ SOLN
INTRAMUSCULAR | Status: AC
  Filled 2014-12-21: qty 5

## 2014-12-21 MED ORDER — ALBUTEROL SULFATE HFA 108 (90 BASE) MCG/ACT IN AERS
INHALATION_SPRAY | RESPIRATORY_TRACT | Status: AC
  Filled 2014-12-21: qty 6.7

## 2014-12-21 MED ORDER — SPIRONOLACTONE 25 MG PO TABS
25.0000 mg | ORAL_TABLET | Freq: Every day | ORAL | Status: DC
  Administered 2014-12-22 – 2014-12-24 (×3): 25 mg via ORAL
  Filled 2014-12-21 (×4): qty 1

## 2014-12-21 MED ORDER — SODIUM CHLORIDE 0.9 % IJ SOLN
INTRAMUSCULAR | Status: AC
  Filled 2014-12-21: qty 10

## 2014-12-21 MED ORDER — ALBUTEROL SULFATE (2.5 MG/3ML) 0.083% IN NEBU: 2.5000 mg | INHALATION_SOLUTION | Freq: Four times a day (QID) | RESPIRATORY_TRACT | Status: DC | PRN

## 2014-12-21 MED ORDER — ALUM & MAG HYDROXIDE-SIMETH 200-200-20 MG/5ML PO SUSP: 15.0000 mL | ORAL | Status: DC | PRN

## 2014-12-21 MED ORDER — ONDANSETRON HCL 4 MG/2ML IJ SOLN
INTRAMUSCULAR | Status: DC | PRN
  Administered 2014-12-21: 4 mg via INTRAVENOUS

## 2014-12-21 MED ORDER — ASPIRIN EC 81 MG PO TBEC
162.0000 mg | DELAYED_RELEASE_TABLET | Freq: Every day | ORAL | Status: DC
  Administered 2014-12-22 – 2014-12-24 (×3): 162 mg via ORAL
  Filled 2014-12-21 (×4): qty 2

## 2014-12-21 MED ORDER — NEOSTIGMINE METHYLSULFATE 10 MG/10ML IV SOLN
INTRAVENOUS | Status: DC | PRN
  Administered 2014-12-21: 3 mg via INTRAVENOUS

## 2014-12-21 MED ORDER — GLYCOPYRROLATE 0.2 MG/ML IJ SOLN
INTRAMUSCULAR | Status: AC
  Filled 2014-12-21: qty 1

## 2014-12-21 MED ORDER — HYDROMORPHONE HCL 1 MG/ML IJ SOLN
0.2500 mg | INTRAMUSCULAR | Status: DC | PRN
  Administered 2014-12-21: 0.5 mg via INTRAVENOUS

## 2014-12-21 MED ORDER — PNEUMOCOCCAL VAC POLYVALENT 25 MCG/0.5ML IJ INJ
0.5000 mL | INJECTION | INTRAMUSCULAR | Status: AC
  Administered 2014-12-24: 0.5 mL via INTRAMUSCULAR
  Filled 2014-12-21 (×2): qty 0.5

## 2014-12-21 MED ORDER — CHLORHEXIDINE GLUCONATE CLOTH 2 % EX PADS: 6.0000 | MEDICATED_PAD | Freq: Once | CUTANEOUS | Status: DC

## 2014-12-21 MED ORDER — PHENYLEPHRINE 40 MCG/ML (10ML) SYRINGE FOR IV PUSH (FOR BLOOD PRESSURE SUPPORT)
PREFILLED_SYRINGE | INTRAVENOUS | Status: AC
  Filled 2014-12-21: qty 10

## 2014-12-21 MED ORDER — GLYCOPYRROLATE 0.2 MG/ML IJ SOLN
INTRAMUSCULAR | Status: AC
  Filled 2014-12-21: qty 3

## 2014-12-21 MED ORDER — TRAZODONE HCL 100 MG PO TABS
100.0000 mg | ORAL_TABLET | Freq: Every day | ORAL | Status: DC
  Administered 2014-12-21 – 2014-12-23 (×3): 100 mg via ORAL
  Filled 2014-12-21 (×4): qty 1

## 2014-12-21 MED ORDER — ONDANSETRON HCL 4 MG/2ML IJ SOLN
4.0000 mg | Freq: Four times a day (QID) | INTRAMUSCULAR | Status: DC | PRN
  Administered 2014-12-21: 4 mg via INTRAVENOUS
  Filled 2014-12-21: qty 2

## 2014-12-21 MED ORDER — GLYCOPYRROLATE 0.2 MG/ML IJ SOLN
INTRAMUSCULAR | Status: DC | PRN
  Administered 2014-12-21: 0.4 mg via INTRAVENOUS
  Administered 2014-12-21: 0.2 mg via INTRAVENOUS

## 2014-12-21 MED ORDER — CARVEDILOL 12.5 MG PO TABS
12.5000 mg | ORAL_TABLET | Freq: Two times a day (BID) | ORAL | Status: DC
  Administered 2014-12-22 – 2014-12-24 (×5): 12.5 mg via ORAL
  Filled 2014-12-21 (×8): qty 1

## 2014-12-21 MED ORDER — GABAPENTIN 300 MG PO CAPS
300.0000 mg | ORAL_CAPSULE | Freq: Three times a day (TID) | ORAL | Status: DC
  Administered 2014-12-21 – 2014-12-24 (×8): 300 mg via ORAL
  Filled 2014-12-21 (×11): qty 1

## 2014-12-21 MED ORDER — MEPERIDINE HCL 25 MG/ML IJ SOLN: 6.2500 mg | INTRAMUSCULAR | Status: DC | PRN

## 2014-12-21 MED ORDER — PROTAMINE SULFATE 10 MG/ML IV SOLN
INTRAVENOUS | Status: DC | PRN
  Administered 2014-12-21 (×2): 10 mg via INTRAVENOUS
  Administered 2014-12-21: 20 mg via INTRAVENOUS
  Administered 2014-12-21: 10 mg via INTRAVENOUS

## 2014-12-21 MED ORDER — SUCCINYLCHOLINE CHLORIDE 20 MG/ML IJ SOLN
INTRAMUSCULAR | Status: AC
Start: 2014-12-21 — End: 2014-12-21
  Filled 2014-12-21: qty 1

## 2014-12-21 MED ORDER — FUROSEMIDE 40 MG PO TABS
40.0000 mg | ORAL_TABLET | Freq: Every day | ORAL | Status: DC
  Administered 2014-12-22 – 2014-12-24 (×3): 40 mg via ORAL
  Filled 2014-12-21 (×4): qty 1

## 2014-12-21 MED ORDER — CALCIUM CARBONATE 1250 (500 CA) MG PO TABS
1250.0000 mg | ORAL_TABLET | Freq: Every day | ORAL | Status: DC
  Administered 2014-12-22 – 2014-12-24 (×3): 1250 mg via ORAL
  Filled 2014-12-21 (×4): qty 1

## 2014-12-21 MED ORDER — GLYCOPYRROLATE 0.2 MG/ML IJ SOLN
INTRAMUSCULAR | Status: AC
  Filled 2014-12-21: qty 2

## 2014-12-21 MED ORDER — HYDRALAZINE HCL 20 MG/ML IJ SOLN: 5.0000 mg | INTRAMUSCULAR | Status: DC | PRN

## 2014-12-21 MED ORDER — THROMBIN 20000 UNITS EX SOLR
CUTANEOUS | Status: AC
  Filled 2014-12-21: qty 20000

## 2014-12-21 MED ORDER — CEFUROXIME SODIUM 1.5 G IJ SOLR
1.5000 g | Freq: Two times a day (BID) | INTRAMUSCULAR | Status: AC
  Administered 2014-12-21 – 2014-12-22 (×2): 1.5 g via INTRAVENOUS
  Filled 2014-12-21 (×3): qty 1.5

## 2014-12-21 MED ORDER — MAGNESIUM SULFATE 2 GM/50ML IV SOLN
2.0000 g | Freq: Every day | INTRAVENOUS | Status: DC | PRN
  Filled 2014-12-21: qty 50

## 2014-12-21 MED ORDER — ONDANSETRON HCL 4 MG/2ML IJ SOLN
INTRAMUSCULAR | Status: AC
  Filled 2014-12-21: qty 2

## 2014-12-21 MED ORDER — ROCURONIUM BROMIDE 100 MG/10ML IV SOLN
INTRAVENOUS | Status: DC | PRN
  Administered 2014-12-21: 40 mg via INTRAVENOUS

## 2014-12-21 MED ORDER — EPHEDRINE SULFATE 50 MG/ML IJ SOLN
INTRAMUSCULAR | Status: AC
Start: 2014-12-21 — End: 2014-12-21
  Filled 2014-12-21: qty 1

## 2014-12-21 MED ORDER — LABETALOL HCL 5 MG/ML IV SOLN
10.0000 mg | INTRAVENOUS | Status: DC | PRN
  Filled 2014-12-21: qty 4

## 2014-12-21 MED ORDER — LACTATED RINGERS IV SOLN
INTRAVENOUS | Status: DC | PRN
  Administered 2014-12-21: 09:00:00 via INTRAVENOUS

## 2014-12-21 MED ORDER — BISACODYL 10 MG RE SUPP: 10.0000 mg | Freq: Every day | RECTAL | Status: DC | PRN

## 2014-12-21 MED ORDER — GUAIFENESIN-DM 100-10 MG/5ML PO SYRP: 15.0000 mL | ORAL_SOLUTION | ORAL | Status: DC | PRN

## 2014-12-21 MED ORDER — HEPARIN SODIUM (PORCINE) 1000 UNIT/ML IJ SOLN
INTRAMUSCULAR | Status: DC | PRN
  Administered 2014-12-21: 8000 [IU] via INTRAVENOUS
  Administered 2014-12-21: 5000 [IU] via INTRAVENOUS

## 2014-12-21 MED ORDER — VECURONIUM BROMIDE 10 MG IV SOLR
INTRAVENOUS | Status: AC
  Filled 2014-12-21: qty 10

## 2014-12-21 MED ORDER — POTASSIUM CHLORIDE CRYS ER 20 MEQ PO TBCR: 20.0000 meq | EXTENDED_RELEASE_TABLET | Freq: Every day | ORAL | Status: DC | PRN

## 2014-12-21 MED ORDER — FENTANYL CITRATE 0.05 MG/ML IJ SOLN
INTRAMUSCULAR | Status: DC | PRN
  Administered 2014-12-21 (×5): 50 ug via INTRAVENOUS

## 2014-12-21 MED ORDER — LACTATED RINGERS IV SOLN
INTRAVENOUS | Status: DC | PRN
  Administered 2014-12-21 (×2): via INTRAVENOUS

## 2014-12-21 MED ORDER — POLYVINYL ALCOHOL 1.4 % OP SOLN
1.0000 [drp] | OPHTHALMIC | Status: DC | PRN
  Filled 2014-12-21: qty 15

## 2014-12-21 MED ORDER — ACETAMINOPHEN-CODEINE #3 300-30 MG PO TABS
1.0000 | ORAL_TABLET | ORAL | Status: DC | PRN
  Administered 2014-12-21 – 2014-12-23 (×7): 1 via ORAL
  Filled 2014-12-21 (×7): qty 1

## 2014-12-21 MED ORDER — DOCUSATE SODIUM 100 MG PO CAPS
100.0000 mg | ORAL_CAPSULE | Freq: Every day | ORAL | Status: DC
  Administered 2014-12-22 – 2014-12-24 (×3): 100 mg via ORAL
  Filled 2014-12-21 (×4): qty 1

## 2014-12-21 MED ORDER — PHENYLEPHRINE HCL 10 MG/ML IJ SOLN
10.0000 mg | INTRAVENOUS | Status: DC | PRN
  Administered 2014-12-21: 12:00:00 via INTRAVENOUS
  Administered 2014-12-21: 25 ug/min via INTRAVENOUS

## 2014-12-21 MED ORDER — MOMETASONE FURO-FORMOTEROL FUM 100-5 MCG/ACT IN AERO
2.0000 | INHALATION_SPRAY | Freq: Two times a day (BID) | RESPIRATORY_TRACT | Status: DC
  Administered 2014-12-21 – 2014-12-24 (×5): 2 via RESPIRATORY_TRACT
  Filled 2014-12-21: qty 8.8

## 2014-12-21 MED ORDER — PHENYLEPHRINE HCL 10 MG/ML IJ SOLN
INTRAMUSCULAR | Status: AC
  Filled 2014-12-21: qty 1

## 2014-12-21 MED ORDER — PANTOPRAZOLE SODIUM 40 MG PO TBEC
40.0000 mg | DELAYED_RELEASE_TABLET | Freq: Every day | ORAL | Status: DC
  Administered 2014-12-22 – 2014-12-24 (×3): 40 mg via ORAL
  Filled 2014-12-21 (×3): qty 1

## 2014-12-21 MED ORDER — PHENOL 1.4 % MT LIQD: 1.0000 | OROMUCOSAL | Status: DC | PRN

## 2014-12-21 MED ORDER — SIMVASTATIN 20 MG PO TABS
20.0000 mg | ORAL_TABLET | Freq: Every day | ORAL | Status: DC
  Administered 2014-12-22 – 2014-12-23 (×2): 20 mg via ORAL
  Filled 2014-12-21 (×4): qty 1

## 2014-12-21 MED ORDER — PROMETHAZINE HCL 25 MG/ML IJ SOLN: 6.2500 mg | INTRAMUSCULAR | Status: DC | PRN

## 2014-12-21 MED ORDER — FENTANYL CITRATE 0.05 MG/ML IJ SOLN
INTRAMUSCULAR | Status: AC
Start: 2014-12-21 — End: 2014-12-21
  Filled 2014-12-21: qty 5

## 2014-12-21 MED ORDER — LIDOCAINE HCL (CARDIAC) 20 MG/ML IV SOLN
INTRAVENOUS | Status: AC
  Filled 2014-12-21: qty 5

## 2014-12-21 MED ORDER — LISINOPRIL 2.5 MG PO TABS
2.5000 mg | ORAL_TABLET | Freq: Every day | ORAL | Status: DC
  Administered 2014-12-22 – 2014-12-24 (×3): 2.5 mg via ORAL
  Filled 2014-12-21 (×4): qty 1

## 2014-12-21 MED ORDER — ALBUMIN HUMAN 5 % IV SOLN
INTRAVENOUS | Status: DC | PRN
  Administered 2014-12-21: 11:00:00 via INTRAVENOUS

## 2014-12-21 MED ORDER — PROTAMINE SULFATE 10 MG/ML IV SOLN
INTRAVENOUS | Status: AC
  Filled 2014-12-21: qty 5

## 2014-12-21 MED ORDER — NEOSTIGMINE METHYLSULFATE 10 MG/10ML IV SOLN
INTRAVENOUS | Status: AC
  Filled 2014-12-21: qty 1

## 2014-12-21 MED ORDER — HYDROMORPHONE HCL 1 MG/ML IJ SOLN
INTRAMUSCULAR | Status: AC
  Filled 2014-12-21: qty 1

## 2014-12-21 MED ORDER — 0.9 % SODIUM CHLORIDE (POUR BTL) OPTIME
TOPICAL | Status: DC | PRN
  Administered 2014-12-21: 2000 mL

## 2014-12-21 MED ORDER — PROTAMINE SULFATE 10 MG/ML IV SOLN
INTRAVENOUS | Status: AC
  Filled 2014-12-21: qty 10

## 2014-12-21 MED ORDER — SODIUM CHLORIDE 0.9 % IV SOLN: 500.0000 mL | Freq: Once | INTRAVENOUS | Status: AC | PRN

## 2014-12-21 SURGICAL SUPPLY — 42 items
BAG ISL DRAPE 18X18 STRL (DRAPES) ×2
BAG ISOLATION DRAPE 18X18 (DRAPES) ×2 IMPLANT
CANISTER SUCTION 2500CC (MISCELLANEOUS) ×3 IMPLANT
CANNULA VESSEL 3MM 2 BLNT TIP (CANNULA) ×6 IMPLANT
CATH FOLEY 2WAY SLVR  5CC 12FR (CATHETERS) ×1
CATH FOLEY 2WAY SLVR 5CC 12FR (CATHETERS) IMPLANT
CLIP TI MEDIUM 24 (CLIP) ×3 IMPLANT
CLIP TI WIDE RED SMALL 24 (CLIP) ×3 IMPLANT
DRAIN SNY WOU (WOUND CARE) IMPLANT
DRAPE ISOLATION BAG 18X18 (DRAPES) ×1
ELECT REM PT RETURN 9FT ADLT (ELECTROSURGICAL) ×3
ELECTRODE REM PT RTRN 9FT ADLT (ELECTROSURGICAL) ×2 IMPLANT
EVACUATOR SILICONE 100CC (DRAIN) IMPLANT
GAUZE SPONGE 4X4 16PLY XRAY LF (GAUZE/BANDAGES/DRESSINGS) ×1 IMPLANT
GLOVE BIO SURGEON STRL SZ 6.5 (GLOVE) ×2 IMPLANT
GLOVE BIO SURGEON STRL SZ7.5 (GLOVE) ×5 IMPLANT
GOWN STRL REUS W/ TWL LRG LVL3 (GOWN DISPOSABLE) ×6 IMPLANT
GOWN STRL REUS W/TWL LRG LVL3 (GOWN DISPOSABLE) ×9
GRAFT HEMASHIELD 8MM (Vascular Products) ×3 IMPLANT
GRAFT VASC STRG 30X8KNIT (Vascular Products) IMPLANT
KIT BASIN OR (CUSTOM PROCEDURE TRAY) ×3 IMPLANT
KIT ROOM TURNOVER OR (KITS) ×3 IMPLANT
LIQUID BAND (GAUZE/BANDAGES/DRESSINGS) ×1 IMPLANT
LOOP VESSEL MINI RED (MISCELLANEOUS) ×2 IMPLANT
NS IRRIG 1000ML POUR BTL (IV SOLUTION) ×6 IMPLANT
PACK PERIPHERAL VASCULAR (CUSTOM PROCEDURE TRAY) ×3 IMPLANT
PAD ARMBOARD 7.5X6 YLW CONV (MISCELLANEOUS) ×6 IMPLANT
SPONGE SURGIFOAM ABS GEL 100 (HEMOSTASIS) IMPLANT
STAPLER VISISTAT 35W (STAPLE) IMPLANT
SUT PROLENE 5 0 C 1 24 (SUTURE) ×6 IMPLANT
SUT PROLENE 6 0 CC (SUTURE) ×3 IMPLANT
SUT SILK 3 0 (SUTURE) ×3
SUT SILK 3-0 18XBRD TIE 12 (SUTURE) IMPLANT
SUT VIC AB 2-0 CT1 27 (SUTURE) ×9
SUT VIC AB 2-0 CT1 TAPERPNT 27 (SUTURE) ×4 IMPLANT
SUT VIC AB 3-0 SH 27 (SUTURE) ×6
SUT VIC AB 3-0 SH 27X BRD (SUTURE) ×4 IMPLANT
SUT VICRYL 4-0 PS2 18IN ABS (SUTURE) ×6 IMPLANT
TAPE UMBILICAL COTTON 1/8X30 (MISCELLANEOUS) IMPLANT
TRAY FOLEY CATH 16FRSI W/METER (SET/KITS/TRAYS/PACK) ×3 IMPLANT
UNDERPAD 30X30 INCONTINENT (UNDERPADS AND DIAPERS) ×3 IMPLANT
WATER STERILE IRR 1000ML POUR (IV SOLUTION) ×3 IMPLANT

## 2014-12-21 NOTE — Progress Notes (Signed)
Pt had 20 beat run V-tach a few min post arrival to 3S-05 from PACU. Paged, no new orders. Will continue to monitor.

## 2014-12-21 NOTE — Op Note (Signed)
Procedure: Left to rightt femoral-femoral bypass, right femoral endarterectomy  Preoperative diagnosis: Claudication  Postoperative diagnosis: Same  Anesthesia: Gen.  Assistant:Kim Trinh PA-C  Upper findings: 8 mm Dacron graft  Indications: Patient is a 69 year old female with chronic right common iliac occlusion, prior left common iliac stent  Operative details: After obtaining informed consent, the patient was taken to the operating room. The patient was placed in supine position on the operating room table. After induction of general anesthesia, a Foley catheter was placed. Next the patient was prepped and draped in the usual sterile fashion from the umbilicus to the toes. A longitudinal incision was made in the left groin. The incision was taken through the subcutaneous tissues down to level of the left common femoral artery.  There were moderate adhesions to the left femoral artery due to a recent cath. The profunda femoris and superficial femoral artery dissected free circumferentially. These were fairly small. They were soft. The native common femoral artery was also dissected free circumferentially.  There was some posterior plaque. Vessel loops were placed around all these. Attention was then turned to the right groin. In similar fashion a longitudinal incision was made in the right groin.  The native common femoral the profunda femoris and superficial femoral arteries were dissected free circumferentially. Vessel loops were placed around all these. There is a good pulse within the left common femoral artery. Next a subcutaneous tunnel was created over the suprapubic region connecting the left and right groin incisions.  The tunnelling was more difficult than usual due to a lower midline abdominal scar which had caused dense adhesions.  These were taken down with cautery taking care to not violate the fascia.   An 8 mm Dacron graft was brought through this. The patient was given 8000 units of  intravenous heparin. The patient was given an additional 5000 units of heparin during the course of the case. Next the left common femoral was controlled with a Cooley clamp. The superficial femoral and profunda femoris arteries were controlled with fine bulldog clamps. A longitudinal opening was made in the anterior wall of the left common femoral artery. The new 8 mm Dacron graft was spatulated and sewn end of graft to side of the artery using a running 5 0 prolene suture. Just prior to completion of the anastomosis, it was forebled backbled and thoroughly flushed the anastomosis was secured and clamps released and there was good pulsatile flow in the graft immediately. The suture line was slightly loose and this was repaired with a single 5-0 Prolene suture. Hemostasis was obtained. Attention was then turned to the right groin. The native common femoral artery was controlled proximally with a Cooley clamp.  There was good backbleeding from the profunda and the superficial femoral arteries. These were controlled with fine bulldog clamps. There was a large calcified plaque obscuring 80% of the artery so I decided to do a femoral endarterectomy extending from the inguinal ligament to the profunda origin.  Good proximal and distal endpoints were obtained.  The new 8 mm Dacron graft was cut to length and beveled. This was then sewn end of graft to side of native common femoral artery using a running 5-0 Prolene suture. Just prior to completion of the anastomosis it was forebled backbled and thoroughly flushed. the anastomosis was secured; clamps released; and there was pulsatile flow in the graft immediately. The patient had good posterior tibial and dorsalis pedis Doppler signals bilaterally. Posterior tibial Doppler signals were more dominant. There was  also good pulsatile flow in the distal superficial femoral artery at the level of the groin. The patient was given 50 mg of protamine for heparin reversal.  Hemostasis was obtained. The inguinal ligament was repaired with a running 2-0 Prolene suture on both sides to make sure that there was no femoral hernia. The groin was then closed in multiple layers using running 2 0 and 3 0 Vicryl suture. The skin of both incisions was closed with a 4 0 Vicryl subcuticular stitch. Dermabond was applied to both incisions. The patient tolerated procedure well and there were no complications. The patient was extubated in the operating room and taken to recovery in stable condition.  Ruta Hinds, MD [facility] [place] Office: [phone] Pager: [phone]

## 2014-12-21 NOTE — Progress Notes (Signed)
  [facility] Progress Note  12/21/2014 5:27 PM Day of Surgery  Subjective:  Having "15/10" pain.   Tmax 98.1 BP 160s-170s 02 100% 2L  Filed Vitals:   12/21/14 1415  BP:   Pulse:   Temp: 97.7 F (36.5 C)  Resp:     Physical Exam: Incisions:  Bilateral groin incisions clean and intact. Extremities:  +doppler signals DP and PT bilaterally  CBC    Component Value Date/Time   WBC 9.2 12/21/2014 1355   RBC 3.57* 12/21/2014 1355   HGB 10.8* 12/21/2014 1355   HCT 33.3* 12/21/2014 1355   PLT 269 12/21/2014 1355   MCV 93.3 12/21/2014 1355   MCH 30.3 12/21/2014 1355   MCHC 32.4 12/21/2014 1355   RDW 14.4 12/21/2014 1355    BMET    Component Value Date/Time   NA 138 12/11/2014 1136   K 4.8 12/11/2014 1136   CL 98 12/11/2014 1136   CO2 30 12/11/2014 1136   GLUCOSE 105* 12/11/2014 1136   BUN 17 12/11/2014 1136   CREATININE 0.79 12/21/2014 1355   CALCIUM 10.2 12/11/2014 1136   GFRNONAA 84* 12/21/2014 1355   GFRAA >90 12/21/2014 1355    INR    Component Value Date/Time   INR 1.04 12/11/2014 1136     Intake/Output Summary (Last 24 hours) at 12/21/14 1727 Last data filed at 12/21/14 1411  Gross per 24 hour  Intake   2050 ml  Output    800 ml  Net   1250 ml     Assessment:  69 y.o. female is s/p: Left to right femoral-femoral bypass, right femoral endarterectomy Day of Surgery  Plan: -Good doppler flow PT and DP bilaterally -Incisions without hematoma.  -BP stable, HR stable in low to mid 60s -DVT prophylaxis:  Lovenox   Virgina Jock, PA-C [facility] Office: [phone] Pager: [phone] 12/21/2014 5:27 PM

## 2014-12-21 NOTE — Progress Notes (Addendum)
Pt continues to be very lethargic; will answer Q's when asked 3x. Will follow commands.Holding POs until more alert. BP down to sys 104, SB.  Will continue to monitor.

## 2014-12-21 NOTE — Anesthesia Postprocedure Evaluation (Signed)
Anesthesia Post Note  Patient: Lisa [name]  Procedure(s) Performed: Procedure(s) (LRB): BYPASS GRAFT LEFT FEMORAL-RIGHT FEMORAL ARTERY (Bilateral) ENDARTERECTOMY FEMORAL (Right)  Anesthesia type: General  Patient location: PACU  Post pain: Pain level controlled  Post assessment: Post-op Vital signs reviewed  Last Vitals: BP 159/56 mmHg  Pulse 50  Temp(Src) 36.5 C (Oral)  Resp 15  SpO2 99%  Post vital signs: Reviewed  Level of consciousness: sedated  Complications: No apparent anesthesia complications

## 2014-12-21 NOTE — Anesthesia Procedure Notes (Signed)
Procedure Name: Intubation Date/Time: 12/21/2014 8:21 AM Performed by: Jenne Campus Pre-anesthesia Checklist: Patient identified, Emergency Drugs available, Suction available, Patient being monitored and Timeout performed Patient Re-evaluated:Patient Re-evaluated prior to inductionOxygen Delivery Method: Circle system utilized Preoxygenation: Pre-oxygenation with 100% oxygen Intubation Type: IV induction Ventilation: Mask ventilation without difficulty and Oral airway inserted - appropriate to patient size Laryngoscope Size: Miller and 3 Grade View: Grade I Tube type: Oral Tube size: 7.0 mm Number of attempts: 1 Airway Equipment and Method: Stylet Placement Confirmation: ETT inserted through vocal cords under direct vision,  positive ETCO2,  CO2 detector and breath sounds checked- equal and bilateral Secured at: 22 cm Tube secured with: Tape Dental Injury: Teeth and Oropharynx as per pre-operative assessment

## 2014-12-21 NOTE — Care Management Note (Signed)
    Page 1 of 1   12/24/2014     10:51:19 AM CARE MANAGEMENT NOTE 12/24/2014  Patient:  Lisa [name], Lisa [name]   Account Number:  [id number]  Date Initiated:  12/21/2014  Documentation initiated by:  COLE,ANGELA  Subjective/Objective Assessment:   PTA from home admitted with PAD, S/P  left to right fem-fem bypass graft and right fem. endarterectomy  12/21/2014.     Action/Plan:   Return to home when medically stable. CM to f/u with discharge disposition.   Anticipated DC Date:  12/24/2014   Anticipated DC Plan:  [place]  CM consult      Choice offered to / List presented to:             Status of service:  Completed, signed off Medicare Important Message given?  YES (If response is "NO", the following Medicare IM given date fields will be blank) Date Medicare IM given:  12/24/2014 Medicare IM given by:  Marvetta Gibbons Date Additional Medicare IM given:   Additional Medicare IM given by:    Discharge Disposition:  HOME/SELF CARE  Per UR Regulation:  Reviewed for med. necessity/level of care/duration of stay  If discussed at [place] of Stay Meetings, dates discussed:    Comments:

## 2014-12-21 NOTE — Clinical Documentation Improvement (Signed)
  Chart states "CHF" which is nonspecific. EF is stated as 27% with LVH treated with Lasix 20mg  qd. Please clarify with Type and Acuity to better illustrate severity of illness and risk of mortality.  . Document acuity --Acute --Chronic --Acute on Chronic . Document type --Diastolic --Systolic --Combined systolic and diastolic . Due to or associated with --Cardiac or other surgery --Hypertension --Valvular disease --Rheumatic heart disease Endocarditis (valvitis) Pericarditis Myocarditis --Other (specify)  Thank You,  Barrie Dunker RN [facility] HIM department [phone]

## 2014-12-21 NOTE — Transfer of Care (Signed)
Immediate Anesthesia Transfer of Care Note  Patient: Lisa [name]  Procedure(s) Performed: Procedure(s): BYPASS GRAFT LEFT FEMORAL-RIGHT FEMORAL ARTERY (Bilateral) ENDARTERECTOMY FEMORAL (Right)  Patient Location: PACU  Anesthesia Type:General  Level of Consciousness: awake, oriented and patient cooperative  Airway & Oxygen Therapy: Patient Spontanous Breathing and Patient connected to face mask oxygen  Post-op Assessment: Report given to RN and Post -op Vital signs reviewed and stable  Post vital signs: Reviewed  Last Vitals:  Filed Vitals:   12/21/14 1245  BP:   Pulse:   Temp: 36.7 C  Resp:     Complications: No apparent anesthesia complications

## 2014-12-21 NOTE — H&P (Signed)
[facility] [place] HISTORY AND PHYSICAL    HPI: This is a 69 y.o. female who has a known history of bilateral carotid stenosis and peripheral arterial disease. She denies any amaurosis fugax, hemiparesis or difficulty with speech.  She denies any claudication.  She states that she has numbness and tingling in both hands, which comes and goes, but does not favor either hand.  She continues to have numbness and tingling in her feet.  She denies any non healing ulcers on her lower extremities.  She recently had a left-sided AICD placed. 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 She complains primarily of numbness along the inner aspect of her right foot. These symptoms are similar to what she has described dating back to 2014. She is on aspirin and statin. She states she has not smoked in 6 years. Other chronic medical problems include COPD, diabetes, hypertension all of which are currently stable.       Past Medical History   Diagnosis  Date   .  COPD (chronic obstructive pulmonary disease)     .  Diabetes mellitus without complication         boarder line   .  Hypertension     .  Carotid artery occlusion     .  CHF (congestive heart failure)     .  Bell's palsy      Past Surgical History   Procedure  Laterality  Date   .  Abdominal hysterectomy       .  Cardiac defibrillator placement    Aug. 2014   .  Joint replacement  Bilateral         implants    Current Outpatient Prescriptions on File Prior to Visit   Medication  Sig  Dispense  Refill   .  amLODipine (NORVASC) 5 MG tablet  Take 1 tablet by mouth daily.       [name]  aspirin 81 MG tablet  Take 81 mg by mouth daily.       [name]  atorvastatin (LIPITOR) 40 MG tablet  Take 1 tablet by mouth daily.       .  calcium carbonate (OS-CAL) 600 MG TABS tablet  Take 1,000 mg by mouth daily with breakfast.        .  carvedilol (COREG) 12.5 MG tablet  Take 12.5 mg by mouth 2 (two) times daily with a meal.       .   Cholecalciferol ([employer] VIT D 5000 HIGH-POTENCY PO)  Take by mouth 2 (two) times a week.       .  Cyanocobalamin (VITAMIN B-12 PO)  Take 1 tablet by mouth daily.       .  digoxin (LANOXIN) 0.125 MG tablet  Take by mouth 3 (three) times a week. Take 1/2 Tab.       .  disopyramide (NORPACE) 100 MG capsule  Take by mouth 4 (four) times daily.       .  furosemide (LASIX) 20 MG tablet  Take 40 mg by mouth daily.        [name]  gabapentin (NEURONTIN) 300 MG capsule  Take 300 mg by mouth 3 (three) times daily.       [name]  lisinopril (PRINIVIL,ZESTRIL) 20 MG tablet  Take 1 tablet by mouth daily.       .  pravastatin (PRAVACHOL) 40 MG tablet  Take 1 tablet by mouth daily.       .  traZODone (  DESYREL) 100 MG tablet  Take 100 mg by mouth at bedtime.       .  Potassium Gluconate 595 MG CAPS  Take by mouth daily.          No current facility-administered medications on file prior to visit.      ROS: [x]  Positive   [ ]  Negative   [ ]  All sytems reviewed and are negative  General: [ ]  Weight loss, [ ]  Fever, [ ]  chills Neurologic: [ ]  Dizziness, [ ]  Blackouts, [ ]  Seizure [ ]  Stroke, [ ]  "Mini stroke", [ ]  Slurred speech, [ ]  Temporary blindness; [ ]  weakness in arms or legs, [ ]  Hoarseness; [X]  numbness/tingling in hands and feet Cardiac: Valu.Nieves ] Chest pain/pressure, [ ]  Shortness of breath at rest [ ]  Shortness of breath with exertion, [ ]  Atrial fibrillation or irregular heartbeat; [x]  swelling in lower extremities; [x]  high blood pressure Vascular: [ ]  Pain in legs with walking, [ ]  Pain in legs at rest, [ ]  Pain in legs at night,  [ ]  Non-healing ulcer, [ ]  Blood clot in vein/DVT,    Pulmonary: [ ]  Home oxygen, [ ]  Productive cough, [ ]  Coughing up blood, [ ]  Asthma,  [x ] Wheezing; [x]  COPD Musculoskeletal:  [ ]  Arthritis, [ ]  Low back pain, [ ]  Joint pain Hematologic: [ ]  Easy Bruising, [ ]  Anemia; [ ]  Hepatitis Gastrointestinal: [ ]  Blood in stool, [ ]  Gastroesophageal Reflux/heartburn, [ ]  Trouble  swallowing Urinary: [ ]  chronic Kidney disease, [ ]  on HD - [ ]  MWF or [ ]  TTHS, [ ]  Burning with urination, [ ]  Difficulty urinating Endocrine: [x ] hx of diabetes, [ ]  hx of thyroid disease Skin: [ ]  Rashes, [ ]  Wounds Psychological: [ ]  Anxiety, [ ]  Depression    PHYSICAL EXAMINATION:    Filed Vitals:     10/29/14 1614  10/29/14 1618   BP:  145/65  118/66   Pulse:  66  69   Temp:  98.2 F (36.8 C)     TempSrc:  Oral     Resp:  16     Height:  5\' 4"  (1.626 m)     Weight:  176 lb (79.833 kg)     SpO2:  92%       General:  WDWN in NAD HENT: WNL Eyes: PERRL Neck: Bilateral carotid bruits right greater than left Pulmonary: normal non-labored breathing , without Rales, rhonchi,  wheezing Cardiac: RRR + bilateral carotid bruits Abdomen: soft, NT, no masses Skin: no rashes, ulcers noted Vascular Exam/Pulses: femoral pulses are palpable bilaterally; her pedal pulses are absent; she has a palpable right radial pulse and the left radial pulse is absent. Extremities: without ischemic changes, no Gangrene , no cellulitis; no open wounds; mild edema BLE Musculoskeletal: no muscle wasting or atrophy       Neurologic: A&O X 3; Appropriate Affect ; SENSATION: normal; MOTOR FUNCTION:  moving all extremities equally. Speech is fluent/normal   Non-Invasive Vascular Imaging:   She had a bilateral carotid duplex today. I reviewed and interpreted this study. This showed 60-80% bilateral carotid artery stenosis,left brachial pressure was 33 mm gradient less than right just above left subclavian stenosis. She also had bilateral ABIs performed which were 0.58 on the right 0.68 which are similar over the last 2 years  ABI's 12/26/12: Right  0.55 Left 0.76  ASSESSMENT/PLAN: 69 y.o. female presents today for f/u ABI's, carotid duplex.    Patient  has moderate peripheral arterial disease. Recent left CIA stent.  However she does not really describe claudication symptoms. She has no history of rest  pain or nonhealing wounds. However, she is clearly frustrated by the pain in her legs. This was well described by her daughter today.  At this point she has opted for femoral femoral bypass to see if we can improve her lower extremity symptoms. Risks benefits possible complications and procedure details were explained to the patient and her daughter today. These include but are not limited to bleeding infection vessel injury. She understands and agrees to proceed.  Her carotid stenosis is asymptomatic. She is on maximal medical management for this.   Ruta Hinds, MD [facility] [place] Office: [phone] Pager: [phone]

## 2014-12-22 ENCOUNTER — Encounter (HOSPITAL_COMMUNITY): Payer: Self-pay | Admitting: Vascular Surgery

## 2014-12-22 DIAGNOSIS — I70219 Atherosclerosis of native arteries of extremities with intermittent claudication, unspecified extremity: Secondary | ICD-10-CM

## 2014-12-22 DIAGNOSIS — M79671 Pain in right foot: Secondary | ICD-10-CM

## 2014-12-22 LAB — BASIC METABOLIC PANEL
Anion gap: 8 (ref 5–15)
BUN: 12 mg/dL (ref 6–23)
CHLORIDE: 99 mmol/L (ref 96–112)
CO2: 27 mmol/L (ref 19–32)
Calcium: 8.6 mg/dL (ref 8.4–10.5)
Creatinine, Ser: 0.91 mg/dL (ref 0.50–1.10)
GFR calc Af Amer: 74 mL/min — ABNORMAL LOW (ref >90)
GFR calc non Af Amer: 64 mL/min — ABNORMAL LOW (ref >90)
Glucose, Bld: 159 mg/dL — ABNORMAL HIGH (ref 70–99)
POTASSIUM: 4.1 mmol/L (ref 3.5–5.1)
SODIUM: 134 mmol/L — AB (ref 135–145)

## 2014-12-22 LAB — CBC
HCT: 31.2 % — ABNORMAL LOW (ref 36.0–46.0)
Hemoglobin: 10 g/dL — ABNORMAL LOW (ref 12.0–15.0)
MCH: 30.2 pg (ref 26.0–34.0)
MCHC: 32.1 g/dL (ref 30.0–36.0)
MCV: 94.3 fL (ref 78.0–100.0)
Platelets: 221 10*3/uL (ref 150–400)
RBC: 3.31 MIL/uL — AB (ref 3.87–5.11)
RDW: 14.6 % (ref 11.5–15.5)
WBC: 7 10*3/uL (ref 4.0–10.5)

## 2014-12-22 LAB — GLUCOSE, CAPILLARY: Glucose-Capillary: 109 mg/dL — ABNORMAL HIGH (ref 70–99)

## 2014-12-22 NOTE — Progress Notes (Signed)
Pt to [facility] to 2W-04, VSS, called report. Called family to notify of [place].

## 2014-12-22 NOTE — Evaluation (Addendum)
Occupational Therapy Evaluation Patient Details Name: Lisa [name] MRN: [id number] DOB: [birth year] Today's Date: 12/22/2014    History of Present Illness 69 y.o. s/p left to right fem-fem bypass graft and right fem. endarterectomy.   Clinical Impression   Pt s/p above. Pt independent with ADLs, PTA. Feel pt will benefit from acute OT to increase independence prior to d/c. Plan to practice LB dressing with AE next session.     Follow Up Recommendations  Home health OT;Supervision - Intermittent (for OOB/mobility)   Equipment Recommendations  Other (comment) (AE)    Recommendations for Other Services       Precautions / Restrictions Precautions Precautions: Fall Restrictions Weight Bearing Restrictions: No      Mobility Bed Mobility               General bed mobility comments: not assessed  Transfers Overall transfer level: Needs assistance   Transfers: Sit to/from Stand Sit to Stand: Min guard         General transfer comment: cues to reinforce hand placement    Balance    Mod A for balance while standing during functional task.                                        ADL Overall ADL's : Needs assistance/impaired                     Lower Body Dressing: Sit to/from stand;Maximal assistance   Toilet Transfer: Min guard;Ambulation;BSC;RW   Toileting- Clothing Manipulation and Hygiene: Moderate assistance;Sit to/from stand Toileting - Clothing Manipulation Details (indicate cue type and reason): decreased balance     Functional mobility during ADLs: Min guard;Rolling walker General ADL Comments: Educated on AE/cost and how to use for LB ADLs. Pt moving well in session.      Vision     Perception     Praxis      Pertinent Vitals/Pain Pain Assessment: 0-10 Pain Score: 7  Pain Location: groin area Pain Intervention(s): Monitored during session;Repositioned   **O2 dropping to 80's toward end of session as OT  was placing pt back on nasal cannula O2. Pt's O2 trended up to 90's nicely.     Hand Dominance     Extremity/Trunk Assessment Upper Extremity Assessment Upper Extremity Assessment: Overall WFL for tasks assessed   Lower Extremity Assessment Lower Extremity Assessment: Defer to PT evaluation       Communication Communication Communication: No difficulties   Cognition Arousal/Alertness: Lethargic;Suspect due to medications Behavior During Therapy: [facility] for tasks assessed/performed Overall Cognitive Status: Within Functional Limits for tasks assessed                     General Comments       Exercises       Shoulder Instructions      Home Living Family/patient expects to be discharged to:: Private residence Living Arrangements: Spouse/significant other;Other (Comment) (nephew) Available Help at Discharge: Family;Available PRN/intermittently Type of Home: House Home Access: Stairs to enter [employer] of Steps: 2 Entrance Stairs-Rails: None Home Layout: Two level;Able to live on main level with bedroom/bathroom     Bathroom Shower/Tub: Teacher, early years/pre: Standard     Home Equipment: Environmental consultant - 2 wheels;Bedside commode;Adaptive equipment Adaptive Equipment: Reacher        Prior Functioning/Environment Level of Independence: Independent  OT Diagnosis: Acute pain   OT Problem List: Decreased range of motion;Pain;Decreased knowledge of precautions;Decreased knowledge of use of DME or AE;Impaired balance (sitting and/or standing);Decreased activity tolerance   OT Treatment/Interventions: Self-care/ADL training;DME and/or AE instruction;Therapeutic activities;Patient/family education;Balance training    OT Goals(Current goals can be found in the care plan section) Acute Rehab OT Goals Patient Stated Goal: learn how to do computers OT Goal Formulation: With patient Time For Goal Achievement: 12/29/14 Potential to  Achieve Goals: Good ADL Goals Pt Will Perform Lower Body Bathing: with set-up;sit to/from stand;with supervision;with adaptive equipment Pt Will Perform Lower Body Dressing: with set-up;with supervision;sit to/from stand Pt Will Transfer to Toilet: with modified independence;ambulating (3 in 1 over commode) Pt Will Perform Toileting - Clothing Manipulation and hygiene: with supervision;sit to/from stand Pt Will Perform Tub/Shower Transfer: Tub transfer;with supervision;ambulating;3 in 1;rolling walker  OT Frequency: Min 2X/week   Barriers to D/C:            Co-evaluation              End of Session Equipment Utilized During Treatment: Gait belt;Rolling walker;Oxygen (O2 used for part of session) Nurse Communication: Mobility status  Activity Tolerance: Patient tolerated treatment well Patient left: in chair;with call bell/phone within reach   Time: 0902-0919 OT Time Calculation (min): 17 min Charges:  OT General Charges $OT Visit: 1 Procedure OT Evaluation $Initial OT Evaluation Tier I: 1 Procedure G-CodesBenito Mccreedy OTR/L [phone] 12/22/2014, 9:59 AM

## 2014-12-22 NOTE — Progress Notes (Signed)
VASCULAR LAB PRELIMINARY  ARTERIAL  ABI completed:    RIGHT    LEFT    PRESSURE WAVEFORM  PRESSURE WAVEFORM  BRACHIAL 114 triphasic BRACHIAL Restricted arm band   DP   DP    AT 60 monophasic AT 68 monophasic  PT 80 monophasic PT 67 monophasic  PER   PER    GREAT TOE  NA GREAT TOE  NA    RIGHT LEFT  ABI 0.7 0.6     [facility] Petrich, RVT 12/22/2014, 4:02 PM

## 2014-12-22 NOTE — Progress Notes (Signed)
12/22/2014 1100 Received to room 2w04 a transfer from [facility]South.  Pt. Is without complaints at this time.  Oriented to room, call light and bed.  Call bell in reach. [place]

## 2014-12-22 NOTE — Progress Notes (Signed)
UR COMPLETED  

## 2014-12-22 NOTE — Progress Notes (Addendum)
  [facility] Progress Note  12/22/2014 7:37 AM 1 Day Post-Op  Subjective:  Having pain with groin incisions  Tmax 100.7 BP sys 110s-170s 02 93% RA  Filed Vitals:   12/22/14 0450  BP:   Pulse: 80  Temp:   Resp: 22    Physical Exam: Incisions:  Bilateral groin incisions clean and intact. No hematoma Extremities:  Biphasic doppler signals DP and PT bilaterally, right greater than left  CBC    Component Value Date/Time   WBC 7.0 12/22/2014 0310   RBC 3.31* 12/22/2014 0310   HGB 10.0* 12/22/2014 0310   HCT 31.2* 12/22/2014 0310   PLT 221 12/22/2014 0310   MCV 94.3 12/22/2014 0310   MCH 30.2 12/22/2014 0310   MCHC 32.1 12/22/2014 0310   RDW 14.6 12/22/2014 0310    BMET    Component Value Date/Time   NA 134* 12/22/2014 0310   K 4.1 12/22/2014 0310   CL 99 12/22/2014 0310   CO2 27 12/22/2014 0310   GLUCOSE 159* 12/22/2014 0310   BUN 12 12/22/2014 0310   CREATININE 0.91 12/22/2014 0310   CALCIUM 8.6 12/22/2014 0310   GFRNONAA 64* 12/22/2014 0310   GFRAA 74* 12/22/2014 0310    INR    Component Value Date/Time   INR 1.04 12/11/2014 1136     Intake/Output Summary (Last 24 hours) at 12/22/14 0737 Last data filed at 12/22/14 0500  Gross per 24 hour  Intake   2960 ml  Output   1402 ml  Net   1558 ml     Assessment:  69 y.o. female is s/p: Left to right femoral-femoral bypass, right femoral endarterectomy  1 Day Post-Op  Plan: -Bypass patent, good doppler signal bilaterally. -Incisions are healing well. Keep dry gauze in groins to wick moisture. -Acute surgical blood loss anemia stable.  -BP stable. Check BP in right arm only.  -Mobilize. -Transfer to 2W.  -DVT prophylaxis: Lovenox.    Virgina Jock, PA-C [facility] Office: [phone] Pager: [phone] 12/22/2014 7:37 AM     2+ DP pulses bilaterally No groin hematoma Ambulate Home when able to get around  Hopefully 1-2 days  Ruta Hinds,  MD [facility] [place]: [phone] Pager: [phone]

## 2014-12-22 NOTE — Evaluation (Signed)
Physical Therapy Evaluation Patient Details Name: Lisa [name] MRN: [id number] DOB: 04/20/1946 Today's Date: 12/22/2014   History of Present Illness  69 y.o. s/p left to right fem-fem bypass graft and right fem. endarterectomy.  Clinical Impression  Pt admitted with/for fem-fem BPGing and R fem endarterectomy.  Pt currently limited functionally due to the problems listed below.  (see problems list.)  Pt will benefit from PT to maximize function and safety to be able to get home safely with available assist of family.     Follow Up Recommendations No PT follow up;Supervision for mobility/OOB    Equipment Recommendations  None recommended by PT    Recommendations for Other Services       Precautions / Restrictions Precautions Precautions: Fall Restrictions Weight Bearing Restrictions: No      Mobility  Bed Mobility               General bed mobility comments: not assessed  Transfers Overall transfer level: Needs assistance Equipment used: Rolling walker (2 wheeled) Transfers: Sit to/from Stand Sit to Stand: Min guard         General transfer comment: cues to reinforce hand placement  Ambulation/Gait Ambulation/Gait assistance: Min guard Ambulation Distance (Feet): 150 Feet Assistive device: Rolling walker (2 wheeled) Gait Pattern/deviations: Step-through pattern Gait velocity: slower   General Gait Details: generally steady with slightly flexed posture due to incisional pain  Stairs            Wheelchair Mobility    Modified Rankin (Stroke Patients Only)       Balance Overall balance assessment: No apparent balance deficits (not formally assessed)                                           Pertinent Vitals/Pain Pain Assessment: 0-10 Pain Score: 7  Pain Location: groin area Pain Descriptors / Indicators: Aching;Burning Pain Intervention(s): Monitored during session    Home Living Family/patient expects to be  discharged to:: Private residence Living Arrangements: Spouse/significant other;Other (Comment) (nephew) Available Help at Discharge: Family;Available PRN/intermittently Type of Home: House Home Access: Stairs to enter Entrance Stairs-Rails: None Entrance Stairs-Number of Steps: 2 Home Layout: Two level;Able to live on main level with bedroom/bathroom Home Equipment: Gilford Rile - 2 wheels;Bedside commode;Adaptive equipment      Prior Function Level of Independence: Independent               Hand Dominance        Extremity/Trunk Assessment   Upper Extremity Assessment: Defer to OT evaluation           Lower Extremity Assessment: Overall WFL for tasks assessed (hip flexor weakness due to pain)         Communication   Communication: No difficulties  Cognition Arousal/Alertness: Lethargic;Suspect due to medications Behavior During Therapy: [facility] for tasks assessed/performed Overall Cognitive Status: Within Functional Limits for tasks assessed                      General Comments General comments (skin integrity, edema, etc.): Sats dropped to 88% on RA, but noticed not good waveform and lips bright colored.  Once sensor still sats jumped up to 95%  EHR in 80's    Exercises        Assessment/Plan    PT Assessment Patient needs continued PT services  PT Diagnosis Acute pain;Other (comment) (decr activity  tolerance)   PT Problem List Decreased strength;Decreased activity tolerance;Decreased mobility;Pain  PT Treatment Interventions DME instruction;Gait training;Stair training;Functional mobility training;Therapeutic activities;Patient/family education   PT Goals (Current goals can be found in the Care Plan section) Acute Rehab PT Goals Patient Stated Goal: learn how to do computers PT Goal Formulation: With patient Time For Goal Achievement: 12/29/14 Potential to Achieve Goals: Good    Frequency Min 3X/week   Barriers to discharge         Co-evaluation               End of Session   Activity Tolerance: Patient tolerated treatment well Patient left: in chair;with call bell/phone within reach Nurse Communication: Mobility status         Time: 8466-5993 PT Time Calculation (min) (ACUTE ONLY): 24 min   Charges:   PT Evaluation $Initial PT Evaluation Tier I: 1 Procedure PT Treatments $Gait Training: 8-22 mins   PT G Codes:        Alic Hilburn, Tessie Fass 12/22/2014, 10:22 AM  12/22/2014  Donnella Sham, PT [phone]  (pager)

## 2014-12-23 MED ORDER — MAGNESIUM HYDROXIDE 400 MG/5ML PO SUSP
15.0000 mL | Freq: Two times a day (BID) | ORAL | Status: DC | PRN
  Administered 2014-12-24: 15 mL via ORAL
  Filled 2014-12-23: qty 30

## 2014-12-23 MED ORDER — HYDROCODONE-ACETAMINOPHEN 5-325 MG PO TABS
1.0000 | ORAL_TABLET | ORAL | Status: DC | PRN
  Administered 2014-12-23 – 2014-12-24 (×2): 2 via ORAL
  Filled 2014-12-23 (×2): qty 2

## 2014-12-23 NOTE — Progress Notes (Signed)
PATIENT JUST ASSISTED WITH BATH BY NURSING TECH. NOW BACK TO BED. LEFT AND RIGHT GROIN SITES LEVEL 0, BUT SORE. PATIENT BEING MEDICATED WITH TYLENOL #3 PRN. USE OF WALKER AND BEDSIDE COMMODE NECESSARY DUE TO GENERALIZED WEAKNESS. PATIENT NORMALLY A 1-ASSIST. REMINDED TO CALL FOR ASSISTANCE WHEN NEEDED.

## 2014-12-23 NOTE — Plan of Care (Signed)
Problem: Phase II Progression Outcomes Goal: Progress activity as tolerated unless otherwise ordered Outcome: Progressing Ambulated x 3 today

## 2014-12-23 NOTE — Progress Notes (Addendum)
  Progress Note    12/23/2014 8:05 AM 2 Days Post-Op  Subjective:  C/o soreness in groins; legs feel better  Tm 99 now afebrile HR 19'X 588'T systolic 25% RA  Filed Vitals:   12/23/14 0500  BP: 102/40  Pulse: 77  Temp: 98.4 F (36.9 C)  Resp: 18    Physical Exam: Incisions:  Bilateral groin incisions are healing nicely Extremities:  Bilateral feet warm; 1+ right DP; trace left DP   CBC    Component Value Date/Time   WBC 7.0 12/22/2014 0310   RBC 3.31* 12/22/2014 0310   HGB 10.0* 12/22/2014 0310   HCT 31.2* 12/22/2014 0310   PLT 221 12/22/2014 0310   MCV 94.3 12/22/2014 0310   MCH 30.2 12/22/2014 0310   MCHC 32.1 12/22/2014 0310   RDW 14.6 12/22/2014 0310    BMET    Component Value Date/Time   NA 134* 12/22/2014 0310   K 4.1 12/22/2014 0310   CL 99 12/22/2014 0310   CO2 27 12/22/2014 0310   GLUCOSE 159* 12/22/2014 0310   BUN 12 12/22/2014 0310   CREATININE 0.91 12/22/2014 0310   CALCIUM 8.6 12/22/2014 0310   GFRNONAA 64* 12/22/2014 0310   GFRAA 74* 12/22/2014 0310    INR    Component Value Date/Time   INR 1.04 12/11/2014 1136     Intake/Output Summary (Last 24 hours) at 12/23/14 0805 Last data filed at 12/23/14 0757  Gross per 24 hour  Intake  458.4 ml  Output   1975 ml  Net -1516.6 ml     Assessment:  69 y.o. female is s/p:  Left to right femoral-femoral bypass, right femoral endarterectomy  2 Days Post-Op  Plan: -Pt doing well this am, but is not mobilizing much.  Hopefully PT can see again today. -may need SNF before being discharged to home -DVT prophylaxis:  Lovenox   Leontine Locket, PA-C [facility] [phone] 12/23/2014 8:05 AM    Left foot DP palpable on my exam, right foot warm Incisions healing Ambulate today otherwise consider SNF vs [place], MD [facility] [place]: [phone] Pager: [phone]

## 2014-12-23 NOTE — Progress Notes (Signed)
Physical Therapy Treatment Patient Details Name: Lisa [name] MRN: [id number] DOB: 1946/01/25 Today's Date: 12/23/2014    History of Present Illness 69 y.o. s/p left to right fem-fem bypass graft and right fem. endarterectomy.    PT Comments    Pt progressing to supervision level with mobility, continues to need min A with transfers from lower surfaces but is doing well from bed and BSC. Standing exercises performed as well as 300' ambulation with RW. Do not anticipate that pt will need rehab after d/c. PT will continue to follow acutely.   Follow Up Recommendations  No PT follow up;Supervision for mobility/OOB     Equipment Recommendations  None recommended by PT    Recommendations for Other Services       Precautions / Restrictions Precautions Precautions: Fall Restrictions Weight Bearing Restrictions: No    Mobility  Bed Mobility Overal bed mobility: Needs Assistance Bed Mobility: Supine to Sit     Supine to sit: Min assist     General bed mobility comments: pt able to roll to side and initiate elevation of trunk but needed min HHA for completion of mvmt and increased time to scoot to EOB due to soreness  Transfers Overall transfer level: Needs assistance Equipment used: Rolling walker (2 wheeled) Transfers: Sit to/from Stand Sit to Stand: Min assist         General transfer comment: min-guard from bed, min A from lower chair to stabilize RW because pt could not get up without putting hand on it. Education for working on sit to stand without hands on RW. Pt able to transfer bed to [facility] with squat pivot and no AD with mod I.  Ambulation/Gait Ambulation/Gait assistance: Supervision Ambulation Distance (Feet): 300 Feet Assistive device: Rolling walker (2 wheeled) Gait Pattern/deviations: Step-through pattern;Decreased dorsiflexion - right;Trunk flexed Gait velocity: decreased Gait velocity interpretation: Below normal speed for age/gender General Gait  Details: no LOB with RW and pt able to ambulate 300' without any breaks. Worked on upright posture and heel strike.   Stairs            Wheelchair Mobility    Modified Rankin (Stroke Patients Only)       Balance Overall balance assessment: No apparent balance deficits (not formally assessed)                                  Cognition Arousal/Alertness: Awake/alert Behavior During Therapy: WFL for tasks assessed/performed Overall Cognitive Status: Within Functional Limits for tasks assessed                      Exercises General Exercises - Lower Extremity Hip Flexion/Marching: AROM;Both;10 reps;Standing Heel Raises: AROM;Both;10 reps;Standing Other Exercises Other Exercises: standing hip extension, 10x each leg Other Exercises: standing heel cord stretch x 30 sec each leg    General Comments General comments (skin integrity, edema, etc.): O2 sats 97% on RA, HR 88 bpm      Pertinent Vitals/Pain Pain Assessment: 0-10 Pain Score: 6  Pain Location: right groin Pain Intervention(s): Monitored during session;Premedicated before session    Home Living                      Prior Function            PT Goals (current goals can now be found in the care plan section) Acute Rehab PT Goals Patient Stated Goal: learn how  to do computers PT Goal Formulation: With patient Time For Goal Achievement: 12/29/14 Potential to Achieve Goals: Good Progress towards PT goals: Progressing toward goals    Frequency  Min 3X/week    PT Plan Current plan remains appropriate    Co-evaluation             End of Session Equipment Utilized During Treatment: Gait belt Activity Tolerance: Patient tolerated treatment well Patient left: in chair;with call bell/phone within reach     Time: 1020-1056 PT Time Calculation (min) (ACUTE ONLY): 36 min  Charges:  $Gait Training: 8-22 mins $Therapeutic Activity: 8-22 mins                    G Codes:      Leighton Roach, PT  Acute Rehab Services  [place], [place] 12/23/2014, 10:59 AM

## 2014-12-24 ENCOUNTER — Telehealth: Payer: Self-pay | Admitting: Vascular Surgery

## 2014-12-24 DIAGNOSIS — D62 Acute posthemorrhagic anemia: Secondary | ICD-10-CM | POA: Diagnosis not present

## 2014-12-24 DIAGNOSIS — I70211 Atherosclerosis of native arteries of extremities with intermittent claudication, right leg: Secondary | ICD-10-CM | POA: Diagnosis not present

## 2014-12-24 DIAGNOSIS — E119 Type 2 diabetes mellitus without complications: Secondary | ICD-10-CM | POA: Diagnosis not present

## 2014-12-24 DIAGNOSIS — M13862 Other specified arthritis, left knee: Secondary | ICD-10-CM | POA: Diagnosis not present

## 2014-12-24 DIAGNOSIS — K219 Gastro-esophageal reflux disease without esophagitis: Secondary | ICD-10-CM | POA: Diagnosis not present

## 2014-12-24 DIAGNOSIS — M13861 Other specified arthritis, right knee: Secondary | ICD-10-CM | POA: Diagnosis not present

## 2014-12-24 DIAGNOSIS — I6523 Occlusion and stenosis of bilateral carotid arteries: Secondary | ICD-10-CM | POA: Diagnosis not present

## 2014-12-24 DIAGNOSIS — Z95810 Presence of automatic (implantable) cardiac defibrillator: Secondary | ICD-10-CM | POA: Diagnosis not present

## 2014-12-24 DIAGNOSIS — J449 Chronic obstructive pulmonary disease, unspecified: Secondary | ICD-10-CM | POA: Diagnosis not present

## 2014-12-24 DIAGNOSIS — I509 Heart failure, unspecified: Secondary | ICD-10-CM | POA: Diagnosis not present

## 2014-12-24 DIAGNOSIS — I429 Cardiomyopathy, unspecified: Secondary | ICD-10-CM | POA: Diagnosis not present

## 2014-12-24 DIAGNOSIS — M170 Bilateral primary osteoarthritis of knee: Secondary | ICD-10-CM | POA: Diagnosis not present

## 2014-12-24 DIAGNOSIS — R209 Unspecified disturbances of skin sensation: Secondary | ICD-10-CM | POA: Diagnosis not present

## 2014-12-24 DIAGNOSIS — Z87891 Personal history of nicotine dependence: Secondary | ICD-10-CM | POA: Diagnosis not present

## 2014-12-24 DIAGNOSIS — I10 Essential (primary) hypertension: Secondary | ICD-10-CM | POA: Diagnosis not present

## 2014-12-24 MED ORDER — ACETAMINOPHEN-CODEINE #3 300-30 MG PO TABS: 1.0000 | ORAL_TABLET | Freq: Four times a day (QID) | ORAL | Status: DC | PRN

## 2014-12-24 NOTE — Progress Notes (Signed)
Occupational Therapy Treatment and Discharge Patient Details Name: Lisa [name] MRN: [id number] DOB: 02-Jan-1946 Today's Date: 12/24/2014    History of present illness 69 y.o. s/p left to right fem-fem bypass graft and right fem. endarterectomy.   OT comments  Assisted pt in dressing and toileting in preparation for discharge. Pt requiring moderate assist for LB dressing and supervision for toileting.  Daughter in room and states she has many family members to assist pt, declines need for AE.  Pt eager to go home.  Follow Up Recommendations  Home health OT;Supervision - Intermittent    Equipment Recommendations  None recommended by OT    Recommendations for Other Services      Precautions / Restrictions Precautions Precautions: Fall Restrictions Weight Bearing Restrictions: No       Mobility Bed Mobility               General bed mobility comments: pt seated at EOB  Transfers   Equipment used: Rolling walker (2 wheeled) Transfers: Sit to/from Stand Sit to Stand: Supervision         General transfer comment: from bed and toilet    Balance                                   ADL Overall ADL's : Needs assistance/impaired                 Upper Body Dressing : Set up;Sitting   Lower Body Dressing: Sit to/from stand;Moderate assistance Lower Body Dressing Details (indicate cue type and reason): assist for R sock and shoe and to start pants over feet Toilet Transfer: Supervision/safety;Ambulation;Comfort height toilet;RW   Toileting- Clothing Manipulation and Hygiene: Supervision/safety;Sit to/from stand       Functional mobility during ADLs: Supervision/safety;Rolling walker General ADL Comments: Pt will rely on family to assist with LB ADL, daughter in room and confirms.      Vision                     Perception     Praxis      Cognition   Behavior During Therapy: WFL for tasks assessed/performed Overall  Cognitive Status: Within Functional Limits for tasks assessed                       Extremity/Trunk Assessment               Exercises     Shoulder Instructions       General Comments      Pertinent Vitals/ Pain       Pain Assessment: Faces Faces Pain Scale: Hurts little more Pain Location: R LE incision Pain Descriptors / Indicators: Sore Pain Intervention(s): Monitored during session  Home Living                                          Prior Functioning/Environment              Frequency Min 2X/week     Progress Toward Goals  OT Goals(current goals can now be found in the care plan section)  Progress towards OT goals: Progressing toward goals     Plan Discharge plan remains appropriate    Co-evaluation  End of Session Equipment Utilized During Treatment: Rolling walker   Activity Tolerance Patient tolerated treatment well   Patient Left Other (comment) (in w/c for discharge home)   Nurse Communication          Time: 9532-0233 OT Time Calculation (min): 14 min  Charges: OT General Charges $OT Visit: 1 Procedure OT Treatments $Self Care/Home Management : 8-22 mins  Malka So 12/24/2014, 1:11 PM  [phone]

## 2014-12-24 NOTE — Progress Notes (Signed)
DC right IJ per order and protocol.  Rowe Pavy, RN

## 2014-12-24 NOTE — Progress Notes (Addendum)
  [facility] Progress Note  12/24/2014 7:31 AM 3 Days Post-Op  Subjective:  Having soreness in groins only when moving around. Ready to go home.    Filed Vitals:   12/24/14 0507  BP: 126/41  Pulse: 74  Temp: 97.8 F (36.6 C)  Resp: 18    Physical Exam: Incisions:  Bilateral groin incisions clean and intact. Minimal serous drainage from left incision. No separation of incision or evidence of infection.  Extremities:  Feet are warm bilaterally. Faintly palpable dorsalis pedis pulses bilaterally.   CBC    Component Value Date/Time   WBC 7.0 12/22/2014 0310   RBC 3.31* 12/22/2014 0310   HGB 10.0* 12/22/2014 0310   HCT 31.2* 12/22/2014 0310   PLT 221 12/22/2014 0310   MCV 94.3 12/22/2014 0310   MCH 30.2 12/22/2014 0310   MCHC 32.1 12/22/2014 0310   RDW 14.6 12/22/2014 0310    BMET    Component Value Date/Time   NA 134* 12/22/2014 0310   K 4.1 12/22/2014 0310   CL 99 12/22/2014 0310   CO2 27 12/22/2014 0310   GLUCOSE 159* 12/22/2014 0310   BUN 12 12/22/2014 0310   CREATININE 0.91 12/22/2014 0310   CALCIUM 8.6 12/22/2014 0310   GFRNONAA 64* 12/22/2014 0310   GFRAA 74* 12/22/2014 0310    INR    Component Value Date/Time   INR 1.04 12/11/2014 1136     Intake/Output Summary (Last 24 hours) at 12/24/14 0731 Last data filed at 12/24/14 0600  Gross per 24 hour  Intake    340 ml  Output   1350 ml  Net  -1010 ml     Assessment:  69 y.o. female is s/p:  Left to right femoral-femoral bypass, right femoral endarterectomy  3 Days Post-Op  Plan: -Ambulated well yesterday.  -Incisions are fine. Emphasized meticulous hygiene to groins.  -Bypass patent. Feet are warm and well perfused. -Discharge home today. Follow up with Dr. Oneida Alar in 2 weeks.    Virgina Jock, PA-C [facility] Office: [phone] Pager: [phone] 12/24/2014 7:31 AM   Palpable DP pulses Some peri incisional bruising Wants to go home Will d/c  and follow up in 2 weeks Restrictions and diet discussed with pt and daughter  Ruta Hinds, MD [facility] [place]: [phone] Pager: [phone]

## 2014-12-24 NOTE — Telephone Encounter (Addendum)
-----   Message from Mena Goes, RN sent at 12/24/2014  9:28 AM EDT ----- Regarding: Schedule   ----- Message -----    From: Alvia Grove, PA-C    Sent: 12/24/2014   7:39 AM      To: [facility] Charge Pool  S/p Left to right femoral-femoral bypass, right femoral endarterectomy 12/21/14  F/u with Dr. Oneida Alar in 2 weeks.   Thanks Kim  notified paitent of post op appt, on 01-07-15 at 3pm with dr. Oneida Alar

## 2014-12-24 NOTE — Progress Notes (Signed)
Patient walked 433ft this morning. Tolerated walking well.

## 2014-12-24 NOTE — Progress Notes (Signed)
Discharge paperwork given and reviewed with patient. Prescriptions given to patient. No questions verbalized. Patient is ready for discharge.

## 2014-12-25 DIAGNOSIS — M1990 Unspecified osteoarthritis, unspecified site: Secondary | ICD-10-CM | POA: Diagnosis not present

## 2014-12-25 DIAGNOSIS — K5900 Constipation, unspecified: Secondary | ICD-10-CM | POA: Diagnosis not present

## 2014-12-25 DIAGNOSIS — I509 Heart failure, unspecified: Secondary | ICD-10-CM | POA: Diagnosis not present

## 2014-12-25 DIAGNOSIS — K5641 Fecal impaction: Secondary | ICD-10-CM | POA: Diagnosis not present

## 2014-12-25 DIAGNOSIS — Z87891 Personal history of nicotine dependence: Secondary | ICD-10-CM | POA: Diagnosis not present

## 2014-12-25 DIAGNOSIS — I10 Essential (primary) hypertension: Secondary | ICD-10-CM | POA: Diagnosis not present

## 2014-12-25 NOTE — Discharge Summary (Signed)
[facility] Discharge Summary  Lisa [name] Jun 03, 1946 69 y.o. female  [id number]  Admission Date: 12/21/2014  Discharge Date: 12/24/2014  Physician: Ruta Hinds, MD  Admission Diagnosis: Peripheral artery disease I70.92  HPI:   This is a 69 y.o. female who has a known history of bilateral carotid stenosis and peripheral arterial disease. She denies any amaurosis fugax, hemiparesis or difficulty with speech. She denies any claudication. She states that she has numbness and tingling in both hands, which comes and goes, but does not favor either hand. She continues to have numbness and tingling in her feet. She denies any non healing ulcers on her lower extremities. She recently had a left-sided AICD placed. 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 She complains primarily of numbness along the inner aspect of her right foot. These symptoms are similar to what she has described dating back to 2014. She is on aspirin and statin. She states she has not smoked in 6 years. Other chronic medical problems include COPD, diabetes, hypertension all of which are currently stable.  Hospital Course:  The patient was admitted to the hospital and taken to the operating room on 12/21/2014 and underwent: bypass graft left femoral to right femoral artery with right femoral endarterectomy.   The patient tolerated the procedure well and was transported to the PACU in stable condition.   The patient did well post-operatively. She had 2+ dorsalis pedis pulses bilaterally and her groin incisions were healing well without hematoma. She was transferred to the telemetry floor on POD 1. She remained in the hospital on POD 2 to work on ambulation. She was progressing well with PT and was discharged home on POD 3 in good condition.   CBC    Component Value Date/Time   WBC 7.0 12/22/2014 0310   RBC 3.31* 12/22/2014 0310   HGB 10.0* 12/22/2014 0310   HCT 31.2* 12/22/2014  0310   PLT 221 12/22/2014 0310   MCV 94.3 12/22/2014 0310   MCH 30.2 12/22/2014 0310   MCHC 32.1 12/22/2014 0310   RDW 14.6 12/22/2014 0310    BMET    Component Value Date/Time   NA 134* 12/22/2014 0310   K 4.1 12/22/2014 0310   CL 99 12/22/2014 0310   CO2 27 12/22/2014 0310   GLUCOSE 159* 12/22/2014 0310   BUN 12 12/22/2014 0310   CREATININE 0.91 12/22/2014 0310   CALCIUM 8.6 12/22/2014 0310   GFRNONAA 64* 12/22/2014 0310   GFRAA 74* 12/22/2014 0310     Discharge Instructions:   The patient is discharged to home with extensive instructions on wound care and progressive ambulation.  They are instructed not to drive or perform any heavy lifting until returning to see the physician in his office.  Discharge Instructions    Call MD for:  redness, tenderness, or signs of infection (pain, swelling, bleeding, redness, odor or green/yellow discharge around incision site)    Complete by:  As directed      Call MD for:  severe or increased pain, loss or decreased feeling  in affected limb(s)    Complete by:  As directed      Call MD for:  temperature >100.5    Complete by:  As directed      Discharge wound care:    Complete by:  As directed   Wash the groin wound with soap and water daily and pat dry. (No tub bath-only shower)  Then put a dry gauze or washcloth there to  keep this area dry daily and as needed.  Do not use Vaseline or neosporin on your incisions.  Only use soap and water on your incisions and then protect and keep dry.     Driving Restrictions    Complete by:  As directed   No driving for 2 weeks     Increase activity slowly    Complete by:  As directed   Walk with assistance use walker or cane as needed     Lifting restrictions    Complete by:  As directed   No lifting for 2 weeks     Resume previous diet    Complete by:  As directed            Discharge Diagnosis:  Peripheral artery disease I70.92  Secondary Diagnosis: Patient Active Problem List    Diagnosis Date Noted  . 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 Paresthesia of both hands 03/19/2014  . Numbness- Right foot 03/19/2014  . Right foot pain 03/19/2014  . Peripheral vascular disease, unspecified 12/26/2012  . Occlusion and stenosis of carotid artery without mention of cerebral infarction 11/14/2012   Past Medical History  Diagnosis Date  . COPD (chronic obstructive pulmonary disease)   . Hypertension   . Carotid artery occlusion   . CHF (congestive heart failure)   . Bell's palsy   . Non-ischemic cardiomyopathy   . AICD (automatic cardioverter/defibrillator) present     Biotroniks  . Shortness of breath dyspnea   . Pneumonia     hosp. in [place]-2015  . Diabetes mellitus without complication     borderline  . GERD (gastroesophageal reflux disease)     uses otc for occas. heartburn   . Arthritis     knees with arthritis as well as has back, pt. remarks that her hands fall asleep when she is in the bed or in certain positions        Medication List    TAKE these medications        acetaminophen 650 MG CR tablet  Commonly known as:  TYLENOL  Take 650 mg by mouth every 8 (eight) hours as needed for pain.     acetaminophen-codeine 300-30 MG per tablet  Commonly known as:  TYLENOL #3  Take 1 tablet by mouth every 6 (six) hours as needed for moderate pain.     albuterol 108 (90 BASE) MCG/ACT inhaler  Commonly known as:  PROVENTIL HFA;VENTOLIN HFA  Inhale 2 puffs into the lungs every 6 (six) hours as needed for wheezing or shortness of breath.     aspirin 81 MG tablet  Take 162 mg by mouth daily.     calcium carbonate 600 MG Tabs tablet  Commonly known as:  OS-CAL  Take 1,200 mg by mouth daily with breakfast.     carvedilol 12.5 MG tablet  Commonly known as:  COREG  Take 12.5 mg by mouth 2 (two) times daily with a meal.     [employer] VIT D 5000 HIGH-POTENCY PO  Take by mouth 2 (two) times a week.      Cyanocobalamin 1000 MCG/ML Kit  Inject as directed every 30 (thirty) days.     digoxin 0.125 MG tablet  Commonly known as:  LANOXIN  Take 0.0625 mg by mouth 3 (three) times a week. Take 1/2 Tab Monday Wednesday and Friday     Fluticasone-Salmeterol 250-50 MCG/DOSE Aepb  Commonly known as:  ADVAIR  Inhale 2 puffs into the lungs daily.  furosemide 40 MG tablet  Commonly known as:  LASIX  Take 40-80 mg by mouth daily. 36m on Monday and Tuesday and 40 mg all other days     gabapentin 300 MG capsule  Commonly known as:  NEURONTIN  Take 300 mg by mouth 3 (three) times daily.     hydroxypropyl methylcellulose / hypromellose 2.5 % ophthalmic solution  Commonly known as:  ISOPTO TEARS / GONIOVISC  Place 1 drop into both eyes as needed for dry eyes.     lisinopril 5 MG tablet  Commonly known as:  PRINIVIL,ZESTRIL  Take 2.5 mg by mouth daily.     simvastatin 20 MG tablet  Commonly known as:  ZOCOR  Take 20 mg by mouth daily at 6 PM.     spironolactone 25 MG tablet  Commonly known as:  ALDACTONE  Take 25 mg by mouth daily.     traZODone 100 MG tablet  Commonly known as:  DESYREL  Take 100 mg by mouth at bedtime.        Tylenol #30 No Refill  Disposition: Home  Patient's condition: is Good  Follow up: 1. Dr. FOneida Alarin 2 weeks   KVirgina Jock PA-C [facility] [phone]/09/2014  10:44 AM  - For VQI Registry use --- Instructions: Press F2 to tab through selections.  Delete question if not applicable.   Post-op:  Wound infection: No  Graft infection: No  Transfusion: No New Arrhythmia: No Ipsilateral amputation: No, [ ]  Minor, [ ]  BKA, [ ]  AKA Discharge patency: [x ] Primary, [ ]  Primary assisted, [ ]  Secondary, [ ]  Occluded Patency judged by: [x ] Dopper, [ ]  Palpable graft pulse, [ x] Palpable distal pulse, [ ]  ABI inc. > 0.15, [ ]  Duplex Discharge ABI: R 0.7, L 0.6 D/C Ambulatory Status: Ambulatory with  Assistance  Complications: MI: No, [ ]  Troponin only, [ ]  EKG or Clinical CHF: No Resp failure:No, [ ]  Pneumonia, [ ]  Ventilator Chg in renal function: No, [ ]  Inc. Cr > 0.5, [ ]  Temp. Dialysis, [ ]  Permanent dialysis Stroke: No, [ ]  Minor, [ ]  Major Return to OR: No   Discharge medications: Statin use:  yes ASA use:  yes Plavix use:  no Beta blocker use: yes Coumadin use: no

## 2014-12-31 ENCOUNTER — Ambulatory Visit (HOSPITAL_COMMUNITY)
Admission: RE | Admit: 2014-12-31 | Discharge: 2014-12-31 | Disposition: A | Discharge status: Home / Self Care | Payer: Medicare Other | Source: Ambulatory Visit | Attending: Vascular Surgery | Admitting: Vascular Surgery

## 2014-12-31 ENCOUNTER — Encounter: Payer: Self-pay | Admitting: Family

## 2014-12-31 ENCOUNTER — Telehealth: Payer: Self-pay

## 2014-12-31 ENCOUNTER — Ambulatory Visit (INDEPENDENT_AMBULATORY_CARE_PROVIDER_SITE_OTHER): Payer: Self-pay | Admitting: Family

## 2014-12-31 VITALS — BP 115/66 | HR 66 | Temp 98.2°F | Resp 16 | Ht 65.0 in | Wt 184.0 lb

## 2014-12-31 DIAGNOSIS — I739 Peripheral vascular disease, unspecified: Secondary | ICD-10-CM | POA: Diagnosis not present

## 2014-12-31 DIAGNOSIS — G8918 Other acute postprocedural pain: Secondary | ICD-10-CM

## 2014-12-31 DIAGNOSIS — L819 Disorder of pigmentation, unspecified: Secondary | ICD-10-CM | POA: Insufficient documentation

## 2014-12-31 DIAGNOSIS — M79604 Pain in right leg: Secondary | ICD-10-CM | POA: Diagnosis not present

## 2014-12-31 DIAGNOSIS — Z95828 Presence of other vascular implants and grafts: Secondary | ICD-10-CM

## 2014-12-31 DIAGNOSIS — Z48812 Encounter for surgical aftercare following surgery on the circulatory system: Secondary | ICD-10-CM | POA: Diagnosis not present

## 2014-12-31 DIAGNOSIS — M7989 Other specified soft tissue disorders: Secondary | ICD-10-CM | POA: Insufficient documentation

## 2014-12-31 DIAGNOSIS — T148XXA Other injury of unspecified body region, initial encounter: Secondary | ICD-10-CM

## 2014-12-31 DIAGNOSIS — Z9889 Other specified postprocedural states: Secondary | ICD-10-CM

## 2014-12-31 DIAGNOSIS — M25579 Pain in unspecified ankle and joints of unspecified foot: Secondary | ICD-10-CM | POA: Insufficient documentation

## 2014-12-31 DIAGNOSIS — L24A9 Irritant contact dermatitis due friction or contact with other specified body fluids: Secondary | ICD-10-CM | POA: Insufficient documentation

## 2014-12-31 DIAGNOSIS — M25571 Pain in right ankle and joints of right foot: Secondary | ICD-10-CM | POA: Insufficient documentation

## 2014-12-31 MED ORDER — HYDROCODONE-ACETAMINOPHEN 5-325 MG PO TABS: 1.0000 | ORAL_TABLET | Freq: Four times a day (QID) | ORAL | Status: DC | PRN

## 2014-12-31 NOTE — Telephone Encounter (Signed)
Discussed sx's with Dr. Oneida Alar.  Recommended to scan the graft to determine patency.  Also, recommended she could also take Ibuprofen in between the dosing of Tylenol #3, to relieve her pain.  Returned call to pt.  Gave recommendations per Dr. Oneida Alar.  Pt. reported that she has a small amt. of drainage from (R) groin.  Appt. given for 12:30 PM today for Duplex of graft, and to see provider.  Agrees with plan.

## 2014-12-31 NOTE — Telephone Encounter (Signed)
rec'd phone call from pt.  C/o a lot of pain in her legs.  Stated that yesterday, both legs hurt from the knees down, and today c/o mostly right lower leg pain.  C/o 3rd toe of right foot a little darker than the other toes.  Stated the 3rd toe jerks at intervals.  Denied any lower extremity swelling.  Does report "moderate swelling" in bilat. groin areas. Reported bilat. groin incisions intact; no drainage; no redness.  Denies fever/chills.  Stated she has mainly taken the Tylenol # 3 at night, and not usually during the day.  Reported relief of pain for approx. 1 hr., after taking the Tylenol #3.  Advised she should take the pain medication as directed: every 6 hrs/ prn to manage the pain in this early post-op phase.  Will discuss her symptoms with Dr. Oneida Alar and call back with recommendations.

## 2014-12-31 NOTE — Progress Notes (Signed)
Postoperative Visit   History of Present Illness  Lisa [name] is a 69 y.o. year old female who is s/p left to right femoral-femoral bypass, right femoral endarterectomy on 12/21/14. She also has a known history of bilateral carotid stenosis.  She returns today for c/o increased pain in right lower leg to foot. She denies fever or chills. She is changing the right groin dressing daily as right groin incision is draining a small amount of clear thin drainage. She noted resolution of her right knee pain before discharged from hospital post surgery, then resumption of right knee pain once she was home. She also has a history of bilateral total knee replacements.   For VQI Use Only  PRE-ADM LIVING: Home  AMB STATUS: Ambulatory  Physical Examination  Filed Vitals:   12/31/14 1328  BP: 115/66  Pulse: 66  Temp: 98.2 F (36.8 C)  TempSrc: Oral  Resp: 16  Height: 5\' 5"  (1.651 m)  Weight: 184 lb (83.462 kg)  SpO2: 97%   Body mass index is 30.62 kg/(m^2).   General: WDWN obese female in NAD Gait: Normal HENT: WNL Eyes: Pupils equal Pulmonary: normal non-labored breathing , without Rales, rhonchi, wheezing Cardiac: RRR, no murmurs detected  Abdomen: soft, NT, no palpable masses Skin: no rashes, no ulcers no Gangrene , no cellulitis.   VASCULAR EXAM  Carotid Bruits Left Right   Negative Positive     VASCULAR EXAM: Extremities without ischemic changes  without Gangrene; without open wounds. Fem-fem bypass incisions healing well.      LE Pulses LEFT RIGHT   FEMORAL not palpable, obese abdomen not palpable, obese abdomen    POPLITEAL not palpable  not palpable   POSTERIOR TIBIAL not palpable, +Dopplerable  not palpable, +Dopplerable    DORSALIS  PEDIS  ANTERIOR TIBIAL 1+ palpable  2+ palpable      Musculoskeletal: no muscle wasting or atrophy; no edema Neurologic: A&O X 3; Appropriate Affect ;  SENSATION: normal; MOTOR FUNCTION: 5/5 Symmetric, CN 2-12 intact Speech is fluent/normal           Bilateral groin: Incisions are healing well.  AORTO-ILIAC DUPLEX LIMITED EVALUATION  FEMORAL-FEMORAL BYPASS GRAFT (12/31/2014)    INDICATION: Right lower extremity pain, Right third toe discoloration    PREVIOUS INTERVENTION(S): Left to right femoral to femoral bypass graft placed 12/21/2014; left common iliac artery stent 11/27/2014    DUPLEX EXAM:     LOCATION Velocities                (cm/s) Diameter AP                          (cm) Diameter Transverse (cm)  Inflow Artery        226    Anastomosis              222 1.1 -  Graft         64    Anastomosis          76 .99 -  Outflow Artery      49       Right  Left  Today's ABI/TBI - -  Previous ABI/TBI      .58 / .41 .68 / .54    If Ankle Brachial Index (ABI) or Toe Brachial Index (TBI) performed, please see complete report   ADDITIONAL FINDINGS: Right common femoral artery  116 cms , left common femoral artery 127 cms, both biphasic  waveforms.    IMPRESSION: The left to right femoral to femoral bypass graft appears patent This was somewhat of a difficult exam due to tenderness due to recent surgery.    Compared to the previous exam:  No recent post -operative exam performed      Medical Decision Making  Lisa [name] is a 69 y.o. year old female who presents s/p  left to right femoral-femoral bypass, right femoral endarterectomy on 12/21/14. Right DP pulse is 2+ palpable, left DP pulse is 1+ palpable, both PT pulses are not palpable but are Dopplerable.  Bypass graft incisions are healing well.  The left to right femoral to femoral bypass graft appears patent by Duplex today. This was somewhat of a difficult exam due to tenderness due to recent  surgery.  Pain in right lower leg is not related to lack of arterial perfusion since her right DP pulse is 2+ palpable. Dr. Oneida Alar spoke with pt and daughter and examined pt.  Take 220 mg Aleve bid for four days, on recommendation of Dr. Oneida Alar. Follow up in 1-2 weeks as already scheduled with Dr. Oneida Alar. Vicodin 1 tablet every 6 hours prn pain, disp #20, 0 refills.    Tamsin Nader, Sharmon Leyden, RN, MSN, FNP-C [facility] [place] Office: [phone]  12/31/2014, 1:06 PM  Clinic MD: Oneida Alar

## 2015-01-04 DIAGNOSIS — I498 Other specified cardiac arrhythmias: Secondary | ICD-10-CM | POA: Diagnosis not present

## 2015-01-06 ENCOUNTER — Encounter: Payer: Self-pay | Admitting: Vascular Surgery

## 2015-01-06 ENCOUNTER — Ambulatory Visit (INDEPENDENT_AMBULATORY_CARE_PROVIDER_SITE_OTHER): Payer: Self-pay | Admitting: Vascular Surgery

## 2015-01-06 VITALS — BP 131/71 | HR 66 | Resp 14 | Ht 65.0 in | Wt 183.0 lb

## 2015-01-06 DIAGNOSIS — Z48812 Encounter for surgical aftercare following surgery on the circulatory system: Secondary | ICD-10-CM

## 2015-01-06 DIAGNOSIS — I739 Peripheral vascular disease, unspecified: Secondary | ICD-10-CM

## 2015-01-06 NOTE — Progress Notes (Signed)
Patient is 69 year old female who is 2 weeks status post left-to-right femoral-femoral bypass. She previously before this had a left iliac stent. Still complains of numbness tingling burning sensation in both feet consistent with neuropathy. She is walking some. She states her pain is improved from when I saw her last about 2 weeks ago. She denies rest pain. She denies incisional drainage.  Filed Vitals:   01/06/15 1056  BP: 131/71  Pulse: 66  Resp: 14  Height: 5\' 5"  (1.651 m)  Weight: 183 lb (83.008 kg)    Abdomen: Healing groin incisions bilaterally some maceration right corner of right groin incision but overall healing no erythema no drainage  Extremities: No palpable pedal pulses but feet pink warm well perfused bilaterally no ulceration  Assessment: Well status post femoral-femoral bypass  Plan: Follow-up 3 months bilateral ABIs arterial duplex scan.  The conversation with the patient today as far symptoms are concerned I believe there primarily neuropathy at this point. She has been completely revascularized. She has multifactorial reasons for her lower extremity symptoms including degenerative joint disease neuropathy but I do not believe her peripheral arterial disease is the source of her symptoms currently.  Ruta Hinds, MD [facility] [place] Office: [phone] Pager: [phone]

## 2015-01-06 NOTE — Addendum Note (Signed)
Addended by: Mena Goes on: 01/06/2015 05:01 PM   Modules accepted: Orders

## 2015-01-07 ENCOUNTER — Encounter: Payer: Self-pay | Admitting: Vascular Surgery

## 2015-01-08 DIAGNOSIS — D510 Vitamin B12 deficiency anemia due to intrinsic factor deficiency: Secondary | ICD-10-CM | POA: Diagnosis not present

## 2015-01-13 DIAGNOSIS — D510 Vitamin B12 deficiency anemia due to intrinsic factor deficiency: Secondary | ICD-10-CM | POA: Diagnosis not present

## 2015-01-13 DIAGNOSIS — G629 Polyneuropathy, unspecified: Secondary | ICD-10-CM | POA: Diagnosis not present

## 2015-01-13 DIAGNOSIS — M25562 Pain in left knee: Secondary | ICD-10-CM | POA: Diagnosis not present

## 2015-01-13 DIAGNOSIS — R600 Localized edema: Secondary | ICD-10-CM | POA: Diagnosis not present

## 2015-01-13 DIAGNOSIS — M25561 Pain in right knee: Secondary | ICD-10-CM | POA: Diagnosis not present

## 2015-02-09 DIAGNOSIS — D510 Vitamin B12 deficiency anemia due to intrinsic factor deficiency: Secondary | ICD-10-CM | POA: Diagnosis not present

## 2015-02-09 DIAGNOSIS — Z0000 Encounter for general adult medical examination without abnormal findings: Secondary | ICD-10-CM | POA: Diagnosis not present

## 2015-02-09 DIAGNOSIS — Z79899 Other long term (current) drug therapy: Secondary | ICD-10-CM | POA: Diagnosis not present

## 2015-02-09 DIAGNOSIS — E780 Pure hypercholesterolemia: Secondary | ICD-10-CM | POA: Diagnosis not present

## 2015-02-10 DIAGNOSIS — G629 Polyneuropathy, unspecified: Secondary | ICD-10-CM | POA: Diagnosis not present

## 2015-02-19 DIAGNOSIS — E042 Nontoxic multinodular goiter: Secondary | ICD-10-CM | POA: Diagnosis not present

## 2015-03-01 DIAGNOSIS — R208 Other disturbances of skin sensation: Secondary | ICD-10-CM | POA: Diagnosis not present

## 2015-03-08 DIAGNOSIS — M4806 Spinal stenosis, lumbar region: Secondary | ICD-10-CM | POA: Diagnosis not present

## 2015-03-08 DIAGNOSIS — M5441 Lumbago with sciatica, right side: Secondary | ICD-10-CM | POA: Diagnosis not present

## 2015-03-12 ENCOUNTER — Other Ambulatory Visit: Payer: Self-pay | Admitting: Orthopedic Surgery

## 2015-03-12 DIAGNOSIS — M48061 Spinal stenosis, lumbar region without neurogenic claudication: Secondary | ICD-10-CM

## 2015-03-24 DIAGNOSIS — K529 Noninfective gastroenteritis and colitis, unspecified: Secondary | ICD-10-CM | POA: Diagnosis not present

## 2015-03-25 ENCOUNTER — Ambulatory Visit
Admission: RE | Admit: 2015-03-25 | Discharge: 2015-03-25 | Disposition: A | Discharge status: Home / Self Care | Payer: Medicare Other | Source: Ambulatory Visit | Attending: Orthopedic Surgery | Admitting: Orthopedic Surgery

## 2015-03-25 DIAGNOSIS — M47817 Spondylosis without myelopathy or radiculopathy, lumbosacral region: Secondary | ICD-10-CM | POA: Diagnosis not present

## 2015-03-25 DIAGNOSIS — M5127 Other intervertebral disc displacement, lumbosacral region: Secondary | ICD-10-CM | POA: Diagnosis not present

## 2015-03-25 DIAGNOSIS — M48061 Spinal stenosis, lumbar region without neurogenic claudication: Secondary | ICD-10-CM

## 2015-03-25 DIAGNOSIS — M5126 Other intervertebral disc displacement, lumbar region: Secondary | ICD-10-CM | POA: Diagnosis not present

## 2015-03-25 DIAGNOSIS — M4316 Spondylolisthesis, lumbar region: Secondary | ICD-10-CM | POA: Diagnosis not present

## 2015-03-25 DIAGNOSIS — M4186 Other forms of scoliosis, lumbar region: Secondary | ICD-10-CM | POA: Diagnosis not present

## 2015-03-25 MED ORDER — IOHEXOL 180 MG/ML  SOLN
15.0000 mL | Freq: Once | INTRAMUSCULAR | Status: AC | PRN
  Administered 2015-03-25: 15 mL via INTRATHECAL

## 2015-03-25 MED ORDER — DIAZEPAM 5 MG PO TABS
10.0000 mg | ORAL_TABLET | Freq: Once | ORAL | Status: AC
  Administered 2015-03-25: 5 mg via ORAL

## 2015-03-25 NOTE — Discharge Instructions (Signed)
Myelogram Discharge Instructions  1. Go home and rest quietly for the next 24 hours.  It is important to lie flat for the next 24 hours.  Get up only to go to the restroom.  You may lie in the bed or on a couch on your back, your stomach, your left side or your right side.  You may have one pillow under your head.  You may have pillows between your knees while you are on your side or under your knees while you are on your back.  2. DO NOT drive today.  Recline the seat as far back as it will go, while still wearing your seat belt, on the way home.  3. You may get up to go to the bathroom as needed.  You may sit up for 10 minutes to eat.  You may resume your normal diet and medications unless otherwise indicated.  Drink lots of extra fluids today and tomorrow.  4. The incidence of headache, nausea, or vomiting is about 5% (one in 20 patients).  If you develop a headache, lie flat and drink plenty of fluids until the headache goes away.  Caffeinated beverages may be helpful.  If you develop severe nausea and vomiting or a headache that does not go away with flat bed rest, call [phone].  5. You may resume normal activities after your 24 hours of bed rest is over; however, do not exert yourself strongly or do any heavy lifting tomorrow. If when you get up you have a headache when standing, go back to bed and force fluids for another 24 hours.  6. Call your physician for a follow-up appointment.  The results of your myelogram will be sent directly to your physician by the following day.  7. If you have any questions or if complications develop after you arrive home, please call [phone].  Discharge instructions have been explained to the patient.  The patient, or the person responsible for the patient, fully understands these instructions.       May resume Tramadol and Trazodone on March 26, 2015, after 9:30 am.

## 2015-03-30 DIAGNOSIS — I5022 Chronic systolic (congestive) heart failure: Secondary | ICD-10-CM | POA: Diagnosis not present

## 2015-03-30 DIAGNOSIS — E780 Pure hypercholesterolemia: Secondary | ICD-10-CM | POA: Diagnosis not present

## 2015-03-30 DIAGNOSIS — I429 Cardiomyopathy, unspecified: Secondary | ICD-10-CM | POA: Diagnosis not present

## 2015-03-30 DIAGNOSIS — I10 Essential (primary) hypertension: Secondary | ICD-10-CM | POA: Diagnosis not present

## 2015-04-01 DIAGNOSIS — G609 Hereditary and idiopathic neuropathy, unspecified: Secondary | ICD-10-CM | POA: Diagnosis not present

## 2015-04-01 DIAGNOSIS — M79671 Pain in right foot: Secondary | ICD-10-CM | POA: Diagnosis not present

## 2015-04-02 DIAGNOSIS — M5441 Lumbago with sciatica, right side: Secondary | ICD-10-CM | POA: Diagnosis not present

## 2015-04-02 DIAGNOSIS — M4806 Spinal stenosis, lumbar region: Secondary | ICD-10-CM | POA: Diagnosis not present

## 2015-04-05 DIAGNOSIS — Z4502 Encounter for adjustment and management of automatic implantable cardiac defibrillator: Secondary | ICD-10-CM | POA: Diagnosis not present

## 2015-04-05 DIAGNOSIS — I498 Other specified cardiac arrhythmias: Secondary | ICD-10-CM | POA: Diagnosis not present

## 2015-04-06 ENCOUNTER — Encounter: Payer: Self-pay | Admitting: Vascular Surgery

## 2015-04-07 DIAGNOSIS — D510 Vitamin B12 deficiency anemia due to intrinsic factor deficiency: Secondary | ICD-10-CM | POA: Diagnosis not present

## 2015-04-08 ENCOUNTER — Ambulatory Visit (INDEPENDENT_AMBULATORY_CARE_PROVIDER_SITE_OTHER)
Admission: RE | Admit: 2015-04-08 | Discharge: 2015-04-08 | Disposition: A | Discharge status: Home / Self Care | Payer: Medicare Other | Source: Ambulatory Visit | Attending: Vascular Surgery | Admitting: Vascular Surgery

## 2015-04-08 ENCOUNTER — Other Ambulatory Visit: Payer: Self-pay | Admitting: Vascular Surgery

## 2015-04-08 ENCOUNTER — Other Ambulatory Visit (HOSPITAL_COMMUNITY): Payer: Self-pay

## 2015-04-08 ENCOUNTER — Ambulatory Visit (HOSPITAL_COMMUNITY)
Admission: RE | Admit: 2015-04-08 | Discharge: 2015-04-08 | Disposition: A | Discharge status: Home / Self Care | Payer: Medicare Other | Source: Ambulatory Visit | Attending: Vascular Surgery | Admitting: Vascular Surgery

## 2015-04-08 ENCOUNTER — Ambulatory Visit (INDEPENDENT_AMBULATORY_CARE_PROVIDER_SITE_OTHER): Payer: Medicare Other | Admitting: Vascular Surgery

## 2015-04-08 ENCOUNTER — Encounter (HOSPITAL_COMMUNITY): Payer: Self-pay

## 2015-04-08 ENCOUNTER — Encounter: Payer: Self-pay | Admitting: Vascular Surgery

## 2015-04-08 ENCOUNTER — Ambulatory Visit: Payer: Self-pay | Admitting: Vascular Surgery

## 2015-04-08 VITALS — BP 134/78 | HR 53 | Ht 65.0 in | Wt 189.1 lb

## 2015-04-08 DIAGNOSIS — Z48812 Encounter for surgical aftercare following surgery on the circulatory system: Secondary | ICD-10-CM

## 2015-04-08 DIAGNOSIS — I70219 Atherosclerosis of native arteries of extremities with intermittent claudication, unspecified extremity: Secondary | ICD-10-CM

## 2015-04-08 DIAGNOSIS — I739 Peripheral vascular disease, unspecified: Secondary | ICD-10-CM | POA: Insufficient documentation

## 2015-04-08 NOTE — Progress Notes (Signed)
Patient is 69 year old female who is 4 months status post left iliac stent and left-to-right femoral-femoral bypass.  Still complains of numbness tingling burning sensation in both feet consistent with neuropathy. She is walking some. She states her pain is improved. She denies rest pain. She denies incisional drainage.  She states that she is currently being considered for a spinal cord stimulator but has not decided if she wants to proceed with this. She is on aspirin daily.   Review of systems: She denies shortness of breath. She denies chest pain. She has chronic back pain.   Physical exam:     Filed Vitals:   04/08/15 1038  BP: 134/78  Pulse: 53  Height: 5\' 5"  (1.651 m)  Weight: 189 lb 1.6 oz (85.775 kg)  SpO2: 96%     Abdomen: Healed groin incisions  Extremities: 2+ DP pulses bilaterally feet pink warm well perfused bilaterally no ulceration  Ada: Patient had bilateral ABIs performed today. Right side was 0.91 left 0.83 this is significantly improved from March 2016. Duplex ultrasound showed triphasic flow through the femoral-femoral bypass. I reviewed and interpreted this study.  Assessment: Doing well post femoral-femoral bypass  Plan:  The conversation with the patient today as far symptoms are concerned I believe there primarily neuropathy at this point. She has been completely revascularized. She has multifactorial reasons for her lower extremity symptoms including degenerative joint disease neuropathy but I do not believe her peripheral arterial disease is the source of her symptoms currently. She will follow-up with her nurse practitioner in 6 months time.  Ruta Hinds, MD [facility] [place] Office: [phone] Pager: [phone]

## 2015-04-08 NOTE — Patient Instructions (Signed)

## 2015-04-12 DIAGNOSIS — G609 Hereditary and idiopathic neuropathy, unspecified: Secondary | ICD-10-CM | POA: Diagnosis not present

## 2015-04-12 DIAGNOSIS — M79671 Pain in right foot: Secondary | ICD-10-CM | POA: Diagnosis not present

## 2015-04-16 DIAGNOSIS — J0100 Acute maxillary sinusitis, unspecified: Secondary | ICD-10-CM | POA: Diagnosis not present

## 2015-04-16 DIAGNOSIS — J209 Acute bronchitis, unspecified: Secondary | ICD-10-CM | POA: Diagnosis not present

## 2015-04-30 DIAGNOSIS — D510 Vitamin B12 deficiency anemia due to intrinsic factor deficiency: Secondary | ICD-10-CM | POA: Diagnosis not present

## 2015-04-30 DIAGNOSIS — M5441 Lumbago with sciatica, right side: Secondary | ICD-10-CM | POA: Diagnosis not present

## 2015-04-30 DIAGNOSIS — M4806 Spinal stenosis, lumbar region: Secondary | ICD-10-CM | POA: Diagnosis not present

## 2015-05-21 DIAGNOSIS — M79671 Pain in right foot: Secondary | ICD-10-CM | POA: Diagnosis not present

## 2015-05-21 DIAGNOSIS — G629 Polyneuropathy, unspecified: Secondary | ICD-10-CM | POA: Diagnosis not present

## 2015-05-21 DIAGNOSIS — G8929 Other chronic pain: Secondary | ICD-10-CM | POA: Diagnosis not present

## 2015-05-24 DIAGNOSIS — G8929 Other chronic pain: Secondary | ICD-10-CM | POA: Diagnosis not present

## 2015-05-24 DIAGNOSIS — M79671 Pain in right foot: Secondary | ICD-10-CM | POA: Diagnosis not present

## 2015-06-11 DIAGNOSIS — Z23 Encounter for immunization: Secondary | ICD-10-CM | POA: Diagnosis not present

## 2015-06-11 DIAGNOSIS — D510 Vitamin B12 deficiency anemia due to intrinsic factor deficiency: Secondary | ICD-10-CM | POA: Diagnosis not present

## 2015-06-11 DIAGNOSIS — I10 Essential (primary) hypertension: Secondary | ICD-10-CM | POA: Diagnosis not present

## 2015-06-23 DIAGNOSIS — G629 Polyneuropathy, unspecified: Secondary | ICD-10-CM | POA: Diagnosis not present

## 2015-06-28 DIAGNOSIS — M21371 Foot drop, right foot: Secondary | ICD-10-CM | POA: Diagnosis not present

## 2015-07-06 DIAGNOSIS — Z4502 Encounter for adjustment and management of automatic implantable cardiac defibrillator: Secondary | ICD-10-CM | POA: Diagnosis not present

## 2015-07-06 DIAGNOSIS — I498 Other specified cardiac arrhythmias: Secondary | ICD-10-CM | POA: Diagnosis not present

## 2015-07-07 DIAGNOSIS — G609 Hereditary and idiopathic neuropathy, unspecified: Secondary | ICD-10-CM | POA: Diagnosis not present

## 2015-07-27 DIAGNOSIS — R768 Other specified abnormal immunological findings in serum: Secondary | ICD-10-CM | POA: Diagnosis not present

## 2015-07-27 DIAGNOSIS — E1143 Type 2 diabetes mellitus with diabetic autonomic (poly)neuropathy: Secondary | ICD-10-CM | POA: Diagnosis not present

## 2015-07-27 DIAGNOSIS — D510 Vitamin B12 deficiency anemia due to intrinsic factor deficiency: Secondary | ICD-10-CM | POA: Diagnosis not present

## 2015-08-02 DIAGNOSIS — R768 Other specified abnormal immunological findings in serum: Secondary | ICD-10-CM | POA: Diagnosis not present

## 2015-08-02 DIAGNOSIS — I6521 Occlusion and stenosis of right carotid artery: Secondary | ICD-10-CM | POA: Diagnosis not present

## 2015-08-02 DIAGNOSIS — G629 Polyneuropathy, unspecified: Secondary | ICD-10-CM | POA: Diagnosis not present

## 2015-08-10 DIAGNOSIS — G629 Polyneuropathy, unspecified: Secondary | ICD-10-CM | POA: Diagnosis not present

## 2015-08-10 DIAGNOSIS — E1142 Type 2 diabetes mellitus with diabetic polyneuropathy: Secondary | ICD-10-CM | POA: Diagnosis not present

## 2015-08-16 DIAGNOSIS — D510 Vitamin B12 deficiency anemia due to intrinsic factor deficiency: Secondary | ICD-10-CM | POA: Diagnosis not present

## 2015-09-24 DIAGNOSIS — Z1231 Encounter for screening mammogram for malignant neoplasm of breast: Secondary | ICD-10-CM | POA: Diagnosis not present

## 2015-10-01 DIAGNOSIS — D510 Vitamin B12 deficiency anemia due to intrinsic factor deficiency: Secondary | ICD-10-CM | POA: Diagnosis not present

## 2015-10-08 ENCOUNTER — Encounter: Payer: Self-pay | Admitting: Vascular Surgery

## 2015-10-08 DIAGNOSIS — E1142 Type 2 diabetes mellitus with diabetic polyneuropathy: Secondary | ICD-10-CM | POA: Diagnosis not present

## 2015-10-11 DIAGNOSIS — I498 Other specified cardiac arrhythmias: Secondary | ICD-10-CM | POA: Diagnosis not present

## 2015-10-11 DIAGNOSIS — Z4502 Encounter for adjustment and management of automatic implantable cardiac defibrillator: Secondary | ICD-10-CM | POA: Diagnosis not present

## 2015-10-12 DIAGNOSIS — M542 Cervicalgia: Secondary | ICD-10-CM | POA: Diagnosis not present

## 2015-10-14 ENCOUNTER — Encounter: Payer: Self-pay | Admitting: Vascular Surgery

## 2015-10-14 ENCOUNTER — Ambulatory Visit (INDEPENDENT_AMBULATORY_CARE_PROVIDER_SITE_OTHER)
Admission: RE | Admit: 2015-10-14 | Discharge: 2015-10-14 | Disposition: A | Discharge status: Home / Self Care | Payer: Medicare Other | Source: Ambulatory Visit | Attending: Vascular Surgery | Admitting: Vascular Surgery

## 2015-10-14 ENCOUNTER — Ambulatory Visit (INDEPENDENT_AMBULATORY_CARE_PROVIDER_SITE_OTHER): Payer: Medicare Other | Admitting: Vascular Surgery

## 2015-10-14 ENCOUNTER — Ambulatory Visit (HOSPITAL_COMMUNITY)
Admission: RE | Admit: 2015-10-14 | Discharge: 2015-10-14 | Disposition: A | Discharge status: Home / Self Care | Payer: Medicare Other | Source: Ambulatory Visit | Attending: Vascular Surgery | Admitting: Vascular Surgery

## 2015-10-14 VITALS — BP 132/63 | HR 63 | Temp 98.2°F | Resp 16 | Ht 64.0 in | Wt 188.0 lb

## 2015-10-14 DIAGNOSIS — I10 Essential (primary) hypertension: Secondary | ICD-10-CM | POA: Diagnosis not present

## 2015-10-14 DIAGNOSIS — Z48812 Encounter for surgical aftercare following surgery on the circulatory system: Secondary | ICD-10-CM | POA: Diagnosis not present

## 2015-10-14 DIAGNOSIS — I70219 Atherosclerosis of native arteries of extremities with intermittent claudication, unspecified extremity: Secondary | ICD-10-CM | POA: Insufficient documentation

## 2015-10-14 DIAGNOSIS — I739 Peripheral vascular disease, unspecified: Secondary | ICD-10-CM | POA: Diagnosis not present

## 2015-10-14 DIAGNOSIS — R0989 Other specified symptoms and signs involving the circulatory and respiratory systems: Secondary | ICD-10-CM

## 2015-10-14 DIAGNOSIS — Z95820 Peripheral vascular angioplasty status with implants and grafts: Secondary | ICD-10-CM | POA: Insufficient documentation

## 2015-10-14 DIAGNOSIS — E785 Hyperlipidemia, unspecified: Secondary | ICD-10-CM | POA: Insufficient documentation

## 2015-10-14 DIAGNOSIS — Z87891 Personal history of nicotine dependence: Secondary | ICD-10-CM | POA: Diagnosis not present

## 2015-10-14 DIAGNOSIS — I6523 Occlusion and stenosis of bilateral carotid arteries: Secondary | ICD-10-CM | POA: Insufficient documentation

## 2015-10-14 NOTE — Progress Notes (Signed)
[facility]  Patient name: Lisa [name] MRN: [id number] DOB: November 06, 1945 Sex: female  REASON FOR VISIT: Follow-up  HPI: Lisa [name] is a 70 y.o. female who presents for follow-up status post left to right femorofemoral bypass 12/21/2014. She was last seen in the office on 04/08/2015. At that time she complained of by lateral numbness and burning sensation in her feet that was consistent with neuropathy. Since that visit she has been put on Lyrica and Cymbalta by her primary care physician. She states that her leg pain has improved. She is walking some. She denies any claudication, rest pain or nonhealing wounds. He is inquiring about a right foot twitching. This occurred after she was started on her Lyrica and Cymbalta 3 months ago.  She takes a daily aspirin and statin. She is not a smoker.  She denies any episodes of amaurosis fugax, hemiplegia, receptive or expressive aphasia.  Past Medical History  Diagnosis Date  . COPD (chronic obstructive pulmonary disease) ([place])   . Hypertension   . Carotid artery occlusion   . CHF (congestive heart failure) ([place])   . Bell's palsy   . Non-ischemic cardiomyopathy ([place])   . AICD (automatic cardioverter/defibrillator) present     Biotroniks  . Shortness of breath dyspnea   . Pneumonia     hosp. in [place]-2015  . Diabetes mellitus without complication (HCC)     borderline  . GERD (gastroesophageal reflux disease)     uses otc for occas. heartburn   . Arthritis     knees with arthritis as well as has back, pt. remarks that her hands fall asleep when she is in the bed or in certain positions     Family History  Problem Relation Age of Onset  . Heart disease Mother   . Hypertension Mother     SOCIAL HISTORY: Social History  Substance Use Topics  . Smoking status: Former Smoker    Types: Cigarettes    Quit date: 11/14/2010  . Smokeless tobacco: Never Used  . Alcohol Use: No    Allergies    Allergen Reactions  . Oxycodone Shortness Of Breath    Current Outpatient Prescriptions  Medication Sig Dispense Refill  . acetaminophen (TYLENOL) 650 MG CR tablet Take 650 mg by mouth every 8 (eight) hours as needed for pain.    [name] acetaminophen-codeine (TYLENOL #3) 300-30 MG per tablet Take 1 tablet by mouth every 6 (six) hours as needed for moderate pain. 30 tablet 0  . albuterol (PROVENTIL HFA;VENTOLIN HFA) 108 (90 BASE) MCG/ACT inhaler Inhale 2 puffs into the lungs every 6 (six) hours as needed for wheezing or shortness of breath.    [name] aspirin 81 MG tablet Take 162 mg by mouth daily.     . calcium carbonate (OS-CAL) 600 MG TABS tablet Take 1,200 mg by mouth daily with breakfast.     . carvedilol (COREG) 12.5 MG tablet Take 12.5 mg by mouth 2 (two) times daily with a meal.    . Cholecalciferol (CVS VIT D 5000 HIGH-POTENCY PO) Take by mouth 2 (two) times a week.    . Cyanocobalamin 1000 MCG/ML KIT Inject as directed every 30 (thirty) days.    . digoxin (LANOXIN) 0.125 MG tablet Take 0.0625 mg by mouth 3 (three) times a week. Take 1/2 Tab Monday Wednesday and Friday    . DULoxetine (CYMBALTA) 30 MG capsule Take 60 mg by mouth at bedtime as needed and may repeat dose one time if  needed. Take 2 tabs at night    . Fluticasone-Salmeterol (ADVAIR) 250-50 MCG/DOSE AEPB Inhale 2 puffs into the lungs daily.    . furosemide (LASIX) 40 MG tablet Take 40-80 mg by mouth daily. 79m on Monday and Tuesday and 40 mg all other days    . hydroxypropyl methylcellulose / hypromellose (ISOPTO TEARS / GONIOVISC) 2.5 % ophthalmic solution Place 1 drop into both eyes as needed for dry eyes.    .Marland Kitchenlisinopril (PRINIVIL,ZESTRIL) 5 MG tablet Take 2.5 mg by mouth daily.    .Marland KitchenLYRICA 75 MG capsule every morning.    . simvastatin (ZOCOR) 20 MG tablet Take 20 mg by mouth daily at 6 PM.     . spironolactone (ALDACTONE) 25 MG tablet Take 25 mg by mouth daily.    .Marland Kitchengabapentin (NEURONTIN) 300 MG capsule Take 300 mg by mouth 3  (three) times daily. Reported on 10/14/2015    . HYDROcodone-acetaminophen (NORCO/VICODIN) 5-325 MG per tablet Take 1 tablet by mouth every 6 (six) hours as needed. (Patient not taking: Reported on 10/14/2015) 20 tablet 0  . traZODone (DESYREL) 100 MG tablet Take 100 mg by mouth at bedtime. Reported on 10/14/2015    . traZODone (DESYREL) 50 MG tablet 50 mg at bedtime. Reported on 10/14/2015     No current facility-administered medications for this visit.    REVIEW OF SYSTEMS:  [X]  denotes positive finding, [ ]  denotes negative finding Cardiac  Comments:  Chest pain or chest pressure:    Shortness of breath upon exertion:    Short of breath when lying flat:    Irregular heart rhythm:        Vascular    Pain in calf, thigh, or hip brought on by ambulation:    Pain in feet at night that wakes you up from your sleep:     Blood clot in your veins:    Leg swelling:         Pulmonary    Oxygen at home:    Productive cough:     Wheezing:         Neurologic    Sudden weakness in arms or legs:     Sudden numbness in arms or legs:     Sudden onset of difficulty speaking or slurred speech:    Temporary loss of vision in one eye:     Problems with dizziness:         Gastrointestinal    Blood in stool:     Vomited blood:         Genitourinary    Burning when urinating:     Blood in urine:        Psychiatric    Major depression:         Hematologic    Bleeding problems:    Problems with blood clotting too easily:        Skin    Rashes or ulcers:        Constitutional    Fever or chills:      PHYSICAL EXAM: Filed Vitals:   10/14/15 1513  BP: 132/63  Pulse: 63  Temp: 98.2 F (36.8 C)  TempSrc: Oral  Resp: 16  Height: 5' 4"  (1.626 m)  Weight: 188 lb (85.276 kg)  SpO2: 98%    GENERAL: The patient is a well-nourished female, in no acute distress. The vital signs are documented above. CARDIAC: There is a regular rate and rhythm. Bilateral carotid bruits. VASCULAR:  Unable to examine femoral  femoral pulse due to patient complaining of pain. Non-palpable pedal pulses. However feet feel warm and well perfused. No wounds seen. PULMONARY: There is good air exchange bilaterally without wheezing or rales.  MUSCULOSKELETAL: There are no major deformities or cyanosis. NEUROLOGIC: No focal weakness or paresthesias are detected. SKIN: There are no ulcers or rashes noted. PSYCHIATRIC: The patient has a normal affect.  DATA:  ABIs 10/14/2015 Right ABI 0.86 with triphasic posterior tibial and dorsalis pedis waveforms. Left ABI 0.77 with monophasic posterior tibial and dorsalis pedis waveforms.  Previous ABIs from 04/08/2015: Right 0.91, left 0.83  Carotid duplex 10/14/2015 Bilateral 60-79% ICA stenosis  MEDICAL ISSUES:  Status post femoral-femoral bypass  Patient continues to have leg pain. The patient denies any claudication symptoms however. Her leg pain appears primarily neuropathic in etiology. She has been started on Lyrica and Cymbalta by her PCP and her symptoms have improved. She is on maximal medical management with aspirin and statin. She is not a smoker. Her ABIs are stable from previous exam. She will follow up in 6 months with repeat ABIs.  Bilateral carotid stenosis  She had bilateral carotid bruits on examination. She has been asymptomatic without TIA or stroke symptoms. Her carotid duplex exam today revealed 60-79% bilateral ICA stenosis which is unchanged from her previous exam.  She will follow up in 6 months with repeat carotid duplex.  Virgina Jock, PA-C [facility] [place]   History and exam findings as above. Patient has a patent femoral-femoral bypass well-perfused feet and reasonable ABIs. Her carotid duplex today shows again moderate bilateral carotid stenosis 60-80% with no significant increase in velocity. She primarily complains of symptoms in her right leg which sounds more neuropathic in nature. She has  been followed by a neurologist in [place] in the past. However, she states that her neurologist is leaving soon. I did offer her a referral to a neurologist and [place] A she wishes to consider that. Otherwise she will follow-up with [place] in 6 months time with repeat ABIs and carotid duplex scan.  Ruta Hinds, MD [facility] [place] Office: [phone] Pager: [phone]

## 2015-10-26 DIAGNOSIS — I10 Essential (primary) hypertension: Secondary | ICD-10-CM | POA: Diagnosis not present

## 2015-10-26 DIAGNOSIS — I429 Cardiomyopathy, unspecified: Secondary | ICD-10-CM | POA: Diagnosis not present

## 2015-10-26 DIAGNOSIS — E782 Mixed hyperlipidemia: Secondary | ICD-10-CM | POA: Diagnosis not present

## 2015-10-26 DIAGNOSIS — G629 Polyneuropathy, unspecified: Secondary | ICD-10-CM | POA: Diagnosis not present

## 2015-10-26 DIAGNOSIS — I5022 Chronic systolic (congestive) heart failure: Secondary | ICD-10-CM | POA: Diagnosis not present

## 2015-11-08 DIAGNOSIS — E1143 Type 2 diabetes mellitus with diabetic autonomic (poly)neuropathy: Secondary | ICD-10-CM | POA: Diagnosis not present

## 2015-11-08 DIAGNOSIS — D510 Vitamin B12 deficiency anemia due to intrinsic factor deficiency: Secondary | ICD-10-CM | POA: Diagnosis not present

## 2015-11-08 DIAGNOSIS — I10 Essential (primary) hypertension: Secondary | ICD-10-CM | POA: Diagnosis not present

## 2015-11-08 DIAGNOSIS — I5022 Chronic systolic (congestive) heart failure: Secondary | ICD-10-CM | POA: Diagnosis not present

## 2015-11-08 DIAGNOSIS — G629 Polyneuropathy, unspecified: Secondary | ICD-10-CM | POA: Diagnosis not present

## 2015-11-08 DIAGNOSIS — Z79899 Other long term (current) drug therapy: Secondary | ICD-10-CM | POA: Diagnosis not present

## 2015-11-08 DIAGNOSIS — E782 Mixed hyperlipidemia: Secondary | ICD-10-CM | POA: Diagnosis not present

## 2015-11-08 DIAGNOSIS — I429 Cardiomyopathy, unspecified: Secondary | ICD-10-CM | POA: Diagnosis not present

## 2015-11-09 DIAGNOSIS — E1165 Type 2 diabetes mellitus with hyperglycemia: Secondary | ICD-10-CM | POA: Diagnosis not present

## 2015-11-09 DIAGNOSIS — I10 Essential (primary) hypertension: Secondary | ICD-10-CM | POA: Diagnosis not present

## 2015-11-09 DIAGNOSIS — D510 Vitamin B12 deficiency anemia due to intrinsic factor deficiency: Secondary | ICD-10-CM | POA: Diagnosis not present

## 2015-12-02 DIAGNOSIS — D510 Vitamin B12 deficiency anemia due to intrinsic factor deficiency: Secondary | ICD-10-CM | POA: Diagnosis not present

## 2015-12-13 DIAGNOSIS — E1142 Type 2 diabetes mellitus with diabetic polyneuropathy: Secondary | ICD-10-CM | POA: Diagnosis not present

## 2015-12-13 DIAGNOSIS — R251 Tremor, unspecified: Secondary | ICD-10-CM | POA: Diagnosis not present

## 2016-01-24 DIAGNOSIS — G629 Polyneuropathy, unspecified: Secondary | ICD-10-CM | POA: Diagnosis not present

## 2016-01-24 DIAGNOSIS — D510 Vitamin B12 deficiency anemia due to intrinsic factor deficiency: Secondary | ICD-10-CM | POA: Diagnosis not present

## 2016-01-24 DIAGNOSIS — E1142 Type 2 diabetes mellitus with diabetic polyneuropathy: Secondary | ICD-10-CM | POA: Diagnosis not present

## 2016-01-24 DIAGNOSIS — R251 Tremor, unspecified: Secondary | ICD-10-CM | POA: Diagnosis not present

## 2016-02-11 DIAGNOSIS — R809 Proteinuria, unspecified: Secondary | ICD-10-CM | POA: Diagnosis not present

## 2016-02-11 DIAGNOSIS — N3001 Acute cystitis with hematuria: Secondary | ICD-10-CM | POA: Diagnosis not present

## 2016-02-11 DIAGNOSIS — N3090 Cystitis, unspecified without hematuria: Secondary | ICD-10-CM | POA: Diagnosis not present

## 2016-02-11 DIAGNOSIS — R05 Cough: Secondary | ICD-10-CM | POA: Diagnosis not present

## 2016-02-11 DIAGNOSIS — R0600 Dyspnea, unspecified: Secondary | ICD-10-CM | POA: Diagnosis not present

## 2016-02-11 DIAGNOSIS — R0602 Shortness of breath: Secondary | ICD-10-CM | POA: Diagnosis not present

## 2016-03-02 DIAGNOSIS — I429 Cardiomyopathy, unspecified: Secondary | ICD-10-CM | POA: Diagnosis not present

## 2016-03-02 DIAGNOSIS — Z4502 Encounter for adjustment and management of automatic implantable cardiac defibrillator: Secondary | ICD-10-CM | POA: Diagnosis not present

## 2016-03-02 DIAGNOSIS — D510 Vitamin B12 deficiency anemia due to intrinsic factor deficiency: Secondary | ICD-10-CM | POA: Diagnosis not present

## 2016-03-07 DIAGNOSIS — E782 Mixed hyperlipidemia: Secondary | ICD-10-CM | POA: Diagnosis not present

## 2016-03-07 DIAGNOSIS — Z1389 Encounter for screening for other disorder: Secondary | ICD-10-CM | POA: Diagnosis not present

## 2016-03-07 DIAGNOSIS — Z79899 Other long term (current) drug therapy: Secondary | ICD-10-CM | POA: Diagnosis not present

## 2016-03-07 DIAGNOSIS — Z9181 History of falling: Secondary | ICD-10-CM | POA: Diagnosis not present

## 2016-03-07 DIAGNOSIS — Z0000 Encounter for general adult medical examination without abnormal findings: Secondary | ICD-10-CM | POA: Diagnosis not present

## 2016-04-10 DIAGNOSIS — G629 Polyneuropathy, unspecified: Secondary | ICD-10-CM | POA: Diagnosis not present

## 2016-04-10 DIAGNOSIS — R251 Tremor, unspecified: Secondary | ICD-10-CM | POA: Diagnosis not present

## 2016-04-10 DIAGNOSIS — E1142 Type 2 diabetes mellitus with diabetic polyneuropathy: Secondary | ICD-10-CM | POA: Diagnosis not present

## 2016-04-11 DIAGNOSIS — Z95810 Presence of automatic (implantable) cardiac defibrillator: Secondary | ICD-10-CM | POA: Diagnosis not present

## 2016-04-13 ENCOUNTER — Encounter: Payer: Self-pay | Admitting: Vascular Surgery

## 2016-04-20 ENCOUNTER — Ambulatory Visit (INDEPENDENT_AMBULATORY_CARE_PROVIDER_SITE_OTHER): Payer: Medicare Other | Admitting: Vascular Surgery

## 2016-04-20 ENCOUNTER — Ambulatory Visit (INDEPENDENT_AMBULATORY_CARE_PROVIDER_SITE_OTHER)
Admission: RE | Admit: 2016-04-20 | Discharge: 2016-04-20 | Disposition: A | Discharge status: Home / Self Care | Payer: Medicare Other | Source: Ambulatory Visit | Attending: Vascular Surgery | Admitting: Vascular Surgery

## 2016-04-20 ENCOUNTER — Ambulatory Visit (HOSPITAL_COMMUNITY)
Admission: RE | Admit: 2016-04-20 | Discharge: 2016-04-20 | Disposition: A | Discharge status: Home / Self Care | Payer: Medicare Other | Source: Ambulatory Visit | Attending: Vascular Surgery | Admitting: Vascular Surgery

## 2016-04-20 ENCOUNTER — Encounter: Payer: Self-pay | Admitting: Vascular Surgery

## 2016-04-20 VITALS — BP 116/67 | HR 66 | Temp 97.1°F | Resp 24 | Ht 64.0 in | Wt 194.0 lb

## 2016-04-20 DIAGNOSIS — I739 Peripheral vascular disease, unspecified: Secondary | ICD-10-CM | POA: Insufficient documentation

## 2016-04-20 DIAGNOSIS — I428 Other cardiomyopathies: Secondary | ICD-10-CM | POA: Insufficient documentation

## 2016-04-20 DIAGNOSIS — I509 Heart failure, unspecified: Secondary | ICD-10-CM | POA: Insufficient documentation

## 2016-04-20 DIAGNOSIS — K219 Gastro-esophageal reflux disease without esophagitis: Secondary | ICD-10-CM | POA: Diagnosis not present

## 2016-04-20 DIAGNOSIS — I6523 Occlusion and stenosis of bilateral carotid arteries: Secondary | ICD-10-CM

## 2016-04-20 DIAGNOSIS — J449 Chronic obstructive pulmonary disease, unspecified: Secondary | ICD-10-CM | POA: Insufficient documentation

## 2016-04-20 DIAGNOSIS — G458 Other transient cerebral ischemic attacks and related syndromes: Secondary | ICD-10-CM | POA: Insufficient documentation

## 2016-04-20 DIAGNOSIS — I110 Hypertensive heart disease with heart failure: Secondary | ICD-10-CM | POA: Insufficient documentation

## 2016-04-20 DIAGNOSIS — R0989 Other specified symptoms and signs involving the circulatory and respiratory systems: Secondary | ICD-10-CM | POA: Insufficient documentation

## 2016-04-20 DIAGNOSIS — E119 Type 2 diabetes mellitus without complications: Secondary | ICD-10-CM | POA: Insufficient documentation

## 2016-04-20 LAB — VAS US CAROTID
LCCADSYS: 120 cm/s
LCCAPSYS: 130 cm/s
LEFT ECA DIAS: -29 cm/s
Left CCA dist dias: 28 cm/s
Left CCA prox dias: 29 cm/s
Left ICA dist dias: -25 cm/s
Left ICA dist sys: -115 cm/s
Left ICA prox dias: 76 cm/s
Left ICA prox sys: 274 cm/s
RCCADSYS: -78 cm/s
RCCAPSYS: 101 cm/s
RIGHT CCA MID DIAS: 19 cm/s
RIGHT ECA DIAS: -16 cm/s
Right CCA prox dias: 12 cm/s

## 2016-04-20 NOTE — Progress Notes (Signed)
[facility]   Patient name: Lisa [name]             MRN: [id number]                  DOB: September 21, 1946                    Sex: female   REASON FOR VISIT: Follow-up   HPI: [name] is a 70 y.o. female who presents for follow-up status post left to right femorofemoral bypass 12/21/2014. She was last seen in the office 1/17. At that time she complained of by lateral numbness and burning sensation in her feet that was consistent with neuropathy. This is essentially unchanged. She is walking some. She denies any claudication, rest pain or nonhealing wounds. She is inquiring about a right foot twitching which she also complained about previously. I discussed with her that she could talk with her neurologist about this. This occurred after she was started on her Lyrica and Cymbalta 3 months ago.   She takes a daily aspirin and statin. She is not a smoker.   She denies any episodes of amaurosis fugax, hemiplegia, receptive or expressive aphasia.        Past Medical History  Diagnosis Date  . COPD (chronic obstructive pulmonary disease) ([place] [address])    . Hypertension    . Carotid artery occlusion    . CHF (congestive heart failure) ([place])    . Bell's palsy    . Non-ischemic cardiomyopathy ([place])    . AICD (automatic cardioverter/defibrillator) present        Biotroniks  . Shortness of breath dyspnea    . Pneumonia        hosp. in [place]-2015  . Diabetes mellitus without complication (HCC)        borderline  . GERD (gastroesophageal reflux disease)        uses otc for occas. heartburn   . Arthritis        knees with arthritis as well as has back, pt. remarks that her hands fall asleep when she is in the bed or in certain positions            Family History  Problem Relation Age of Onset  . Heart disease Mother    . Hypertension Mother        SOCIAL HISTORY:      Social History  Substance Use Topics  . Smoking status: Former Smoker   Types: Cigarettes      Quit date: 11/14/2010  . Smokeless tobacco: Never Used  . Alcohol Use: No          Allergies  Allergen Reactions  . Oxycodone Shortness Of Breath      Current Outpatient Prescriptions on File Prior to Visit  Medication Sig Dispense Refill  . aspirin 81 MG tablet Take 162 mg by mouth daily.     . carvedilol (COREG) 12.5 MG tablet Take 12.5 mg by mouth 2 (two) times daily with a meal.    . Cholecalciferol (CVS VIT D 5000 HIGH-POTENCY PO) Take by mouth 2 (two) times a week.    . Cyanocobalamin 1000 MCG/ML KIT Inject as directed every 30 (thirty) days.    . digoxin (LANOXIN) 0.125 MG tablet Take 0.0625 mg by mouth 3 (three) times a week. Take 1/2 Tab Monday Wednesday and Friday    . DULoxetine (CYMBALTA) 30 MG capsule Take 60 mg by mouth at bedtime as  needed and may repeat dose one time if needed. Take 2 tabs at night    . Fluticasone-Salmeterol (ADVAIR) 250-50 MCG/DOSE AEPB Inhale 2 puffs into the lungs daily.    . furosemide (LASIX) 40 MG tablet Take 40-80 mg by mouth daily. 35m on Monday and Tuesday and 40 mg all other days    . lisinopril (PRINIVIL,ZESTRIL) 5 MG tablet Take 2.5 mg by mouth daily.    . simvastatin (ZOCOR) 20 MG tablet Take 20 mg by mouth daily at 6 PM.     . spironolactone (ALDACTONE) 25 MG tablet Take 25 mg by mouth daily.    .Marland Kitchenacetaminophen (TYLENOL) 650 MG CR tablet Take 650 mg by mouth every 8 (eight) hours as needed for pain.    .Marland Kitchenacetaminophen-codeine (TYLENOL #3) 300-30 MG per tablet Take 1 tablet by mouth every 6 (six) hours as needed for moderate pain. (Patient not taking: Reported on 04/20/2016) 30 tablet 0  . albuterol (PROVENTIL HFA;VENTOLIN HFA) 108 (90 BASE) MCG/ACT inhaler Inhale 2 puffs into the lungs every 6 (six) hours as needed for wheezing or shortness of breath.    . calcium carbonate (OS-CAL) 600 MG TABS tablet Take 1,200 mg by mouth daily with breakfast.     . gabapentin (NEURONTIN) 300 MG capsule Take 300 mg by mouth 3  (three) times daily. Reported on 10/14/2015    . HYDROcodone-acetaminophen (NORCO/VICODIN) 5-325 MG per tablet Take 1 tablet by mouth every 6 (six) hours as needed. (Patient not taking: Reported on 10/14/2015) 20 tablet 0  . hydroxypropyl methylcellulose / hypromellose (ISOPTO TEARS / GONIOVISC) 2.5 % ophthalmic solution Place 1 drop into both eyes as needed for dry eyes.    .Marland KitchenLYRICA 75 MG capsule every morning.    . traZODone (DESYREL) 100 MG tablet Take 100 mg by mouth at bedtime. Reported on 10/14/2015    . traZODone (DESYREL) 50 MG tablet 50 mg at bedtime. Reported on 10/14/2015     No current facility-administered medications on file prior to visit.      REVIEW OF SYSTEMS:  _0  denotes positive finding, _1  denotes negative finding Cardiac   Comments:  Chest pain or chest pressure:      Shortness of breath upon exertion:      Short of breath when lying flat:      Irregular heart rhythm:             Vascular      Pain in calf, thigh, or hip brought on by ambulation:      Pain in feet at night that wakes you up from your sleep:       Blood clot in your veins:      Leg swelling:              Pulmonary      Oxygen at home:      Productive cough:       Wheezing:              Neurologic      Sudden weakness in arms or legs:       Sudden numbness in arms or legs:       Sudden onset of difficulty speaking or slurred speech:      Temporary loss of vision in one eye:       Problems with dizziness:              Gastrointestinal      Blood in stool:  Vomited blood:              Genitourinary      Burning when urinating:       Blood in urine:             Psychiatric      Major depression:              Hematologic      Bleeding problems:      Problems with blood clotting too easily:             Skin      Rashes or ulcers:             Constitutional      Fever or chills:          PHYSICAL EXAM:  Vitals:   04/20/16 1135  BP: 116/67  Pulse: 66  Resp: (!) 24    Temp: 97.1 F (36.2 C)  TempSrc: Oral  SpO2: 94%  Weight: 194 lb (88 kg)  Height: 5' 4" (1.626 m)   CARDIAC: There is a regular rate and rhythm. Bilateral carotid bruits. VASCULAR: Non-palpable pedal pulses. However feet feel warm and well perfused. No wounds seen. PULMONARY: There is good air exchange bilaterally without wheezing or rales.  MUSCULOSKELETAL: There are no major deformities or cyanosis. NEUROLOGIC: No focal weakness or paresthesias are detected. SKIN: There are no ulcers or rashes noted. PSYCHIATRIC: The patient has a normal affect.   DATA:  ABIs 10/14/2015 Right ABI 0.86 with triphasic posterior tibial and dorsalis pedis waveforms. Left ABI 0.77 with monophasic posterior tibial and dorsalis pedis waveforms.   Previous ABIs from 04/08/2015: Right 0.82, left 0.81   Carotid duplex from today 40-60% right internal carotid artery stenosis 60-79% left ICA stenosis   MEDICAL ISSUES:   Status post femoral-femoral bypass   Patient continues to have leg pain. The patient denies any claudication symptoms however. Her ABIs are stable from previous exam. She will follow up in 6 months with repeat ABIs.   Bilateral carotid stenosis   She has been asymptomatic without TIA or stroke symptoms. She will follow up in 6 months with repeat carotid duplex.   follow-up with [place] in 6 months time with repeat ABIs    Ruta Hinds, MD [facility] [place] Office: [phone] Pager: [phone]

## 2016-05-05 DIAGNOSIS — M67472 Ganglion, left ankle and foot: Secondary | ICD-10-CM | POA: Diagnosis not present

## 2016-05-05 DIAGNOSIS — M2012 Hallux valgus (acquired), left foot: Secondary | ICD-10-CM | POA: Diagnosis not present

## 2016-05-05 DIAGNOSIS — D510 Vitamin B12 deficiency anemia due to intrinsic factor deficiency: Secondary | ICD-10-CM | POA: Diagnosis not present

## 2016-05-05 DIAGNOSIS — M71372 Other bursal cyst, left ankle and foot: Secondary | ICD-10-CM | POA: Diagnosis not present

## 2016-06-06 DIAGNOSIS — Z95810 Presence of automatic (implantable) cardiac defibrillator: Secondary | ICD-10-CM | POA: Diagnosis not present

## 2016-06-06 DIAGNOSIS — I5022 Chronic systolic (congestive) heart failure: Secondary | ICD-10-CM | POA: Diagnosis not present

## 2016-06-12 DIAGNOSIS — J449 Chronic obstructive pulmonary disease, unspecified: Secondary | ICD-10-CM | POA: Diagnosis not present

## 2016-06-12 DIAGNOSIS — J069 Acute upper respiratory infection, unspecified: Secondary | ICD-10-CM | POA: Diagnosis not present

## 2016-06-14 DIAGNOSIS — I10 Essential (primary) hypertension: Secondary | ICD-10-CM | POA: Diagnosis not present

## 2016-06-14 DIAGNOSIS — I429 Cardiomyopathy, unspecified: Secondary | ICD-10-CM | POA: Diagnosis not present

## 2016-06-14 DIAGNOSIS — E782 Mixed hyperlipidemia: Secondary | ICD-10-CM | POA: Diagnosis not present

## 2016-06-14 DIAGNOSIS — I5020 Unspecified systolic (congestive) heart failure: Secondary | ICD-10-CM | POA: Diagnosis not present

## 2016-06-14 DIAGNOSIS — Z95810 Presence of automatic (implantable) cardiac defibrillator: Secondary | ICD-10-CM | POA: Diagnosis not present

## 2016-06-15 DIAGNOSIS — I429 Cardiomyopathy, unspecified: Secondary | ICD-10-CM | POA: Diagnosis not present

## 2016-06-15 DIAGNOSIS — E782 Mixed hyperlipidemia: Secondary | ICD-10-CM | POA: Diagnosis not present

## 2016-06-15 DIAGNOSIS — I5020 Unspecified systolic (congestive) heart failure: Secondary | ICD-10-CM | POA: Diagnosis not present

## 2016-06-15 DIAGNOSIS — Z95810 Presence of automatic (implantable) cardiac defibrillator: Secondary | ICD-10-CM | POA: Diagnosis not present

## 2016-06-15 DIAGNOSIS — I10 Essential (primary) hypertension: Secondary | ICD-10-CM | POA: Diagnosis not present

## 2016-06-21 DIAGNOSIS — D510 Vitamin B12 deficiency anemia due to intrinsic factor deficiency: Secondary | ICD-10-CM | POA: Diagnosis not present

## 2016-06-21 DIAGNOSIS — Z23 Encounter for immunization: Secondary | ICD-10-CM | POA: Diagnosis not present

## 2016-06-21 DIAGNOSIS — Z95810 Presence of automatic (implantable) cardiac defibrillator: Secondary | ICD-10-CM | POA: Diagnosis not present

## 2016-06-21 DIAGNOSIS — I420 Dilated cardiomyopathy: Secondary | ICD-10-CM | POA: Diagnosis not present

## 2016-06-21 DIAGNOSIS — I493 Ventricular premature depolarization: Secondary | ICD-10-CM | POA: Diagnosis not present

## 2016-06-21 DIAGNOSIS — I10 Essential (primary) hypertension: Secondary | ICD-10-CM | POA: Diagnosis not present

## 2016-06-23 DIAGNOSIS — R0602 Shortness of breath: Secondary | ICD-10-CM | POA: Diagnosis not present

## 2016-06-23 DIAGNOSIS — I509 Heart failure, unspecified: Secondary | ICD-10-CM | POA: Diagnosis not present

## 2016-06-23 DIAGNOSIS — I700 Atherosclerosis of aorta: Secondary | ICD-10-CM | POA: Diagnosis not present

## 2016-06-23 DIAGNOSIS — R9439 Abnormal result of other cardiovascular function study: Secondary | ICD-10-CM | POA: Diagnosis not present

## 2016-06-23 DIAGNOSIS — I429 Cardiomyopathy, unspecified: Secondary | ICD-10-CM | POA: Diagnosis not present

## 2016-06-23 DIAGNOSIS — Z950 Presence of cardiac pacemaker: Secondary | ICD-10-CM | POA: Diagnosis not present

## 2016-06-28 DIAGNOSIS — R9439 Abnormal result of other cardiovascular function study: Secondary | ICD-10-CM | POA: Diagnosis not present

## 2016-06-28 DIAGNOSIS — I493 Ventricular premature depolarization: Secondary | ICD-10-CM | POA: Diagnosis not present

## 2016-06-28 DIAGNOSIS — G629 Polyneuropathy, unspecified: Secondary | ICD-10-CM | POA: Diagnosis not present

## 2016-06-28 DIAGNOSIS — I2510 Atherosclerotic heart disease of native coronary artery without angina pectoris: Secondary | ICD-10-CM | POA: Diagnosis not present

## 2016-06-28 DIAGNOSIS — Z79899 Other long term (current) drug therapy: Secondary | ICD-10-CM | POA: Diagnosis not present

## 2016-06-28 DIAGNOSIS — I10 Essential (primary) hypertension: Secondary | ICD-10-CM | POA: Diagnosis not present

## 2016-06-28 DIAGNOSIS — Z95810 Presence of automatic (implantable) cardiac defibrillator: Secondary | ICD-10-CM | POA: Diagnosis not present

## 2016-06-28 DIAGNOSIS — I5020 Unspecified systolic (congestive) heart failure: Secondary | ICD-10-CM | POA: Diagnosis not present

## 2016-06-28 DIAGNOSIS — I420 Dilated cardiomyopathy: Secondary | ICD-10-CM | POA: Diagnosis not present

## 2016-07-18 DIAGNOSIS — I429 Cardiomyopathy, unspecified: Secondary | ICD-10-CM | POA: Diagnosis not present

## 2016-07-18 DIAGNOSIS — I5020 Unspecified systolic (congestive) heart failure: Secondary | ICD-10-CM | POA: Diagnosis not present

## 2016-07-18 DIAGNOSIS — I10 Essential (primary) hypertension: Secondary | ICD-10-CM | POA: Diagnosis not present

## 2016-07-18 DIAGNOSIS — Z95810 Presence of automatic (implantable) cardiac defibrillator: Secondary | ICD-10-CM | POA: Diagnosis not present

## 2016-07-18 DIAGNOSIS — E782 Mixed hyperlipidemia: Secondary | ICD-10-CM | POA: Diagnosis not present

## 2016-07-18 DIAGNOSIS — D510 Vitamin B12 deficiency anemia due to intrinsic factor deficiency: Secondary | ICD-10-CM | POA: Diagnosis not present

## 2016-08-10 ENCOUNTER — Other Ambulatory Visit: Payer: Self-pay | Admitting: *Deleted

## 2016-08-10 DIAGNOSIS — I739 Peripheral vascular disease, unspecified: Secondary | ICD-10-CM

## 2016-08-10 DIAGNOSIS — I6523 Occlusion and stenosis of bilateral carotid arteries: Secondary | ICD-10-CM

## 2016-08-14 DIAGNOSIS — J449 Chronic obstructive pulmonary disease, unspecified: Secondary | ICD-10-CM | POA: Diagnosis not present

## 2016-08-14 DIAGNOSIS — R05 Cough: Secondary | ICD-10-CM | POA: Diagnosis not present

## 2016-08-29 DIAGNOSIS — D510 Vitamin B12 deficiency anemia due to intrinsic factor deficiency: Secondary | ICD-10-CM | POA: Diagnosis not present

## 2016-09-04 DIAGNOSIS — R0600 Dyspnea, unspecified: Secondary | ICD-10-CM | POA: Diagnosis not present

## 2016-09-04 DIAGNOSIS — J449 Chronic obstructive pulmonary disease, unspecified: Secondary | ICD-10-CM | POA: Diagnosis not present

## 2016-09-05 DIAGNOSIS — I5020 Unspecified systolic (congestive) heart failure: Secondary | ICD-10-CM | POA: Diagnosis not present

## 2016-09-05 DIAGNOSIS — Z95810 Presence of automatic (implantable) cardiac defibrillator: Secondary | ICD-10-CM | POA: Diagnosis not present

## 2016-09-06 DIAGNOSIS — E559 Vitamin D deficiency, unspecified: Secondary | ICD-10-CM | POA: Diagnosis not present

## 2016-09-06 DIAGNOSIS — D510 Vitamin B12 deficiency anemia due to intrinsic factor deficiency: Secondary | ICD-10-CM | POA: Diagnosis not present

## 2016-09-06 DIAGNOSIS — Z79899 Other long term (current) drug therapy: Secondary | ICD-10-CM | POA: Diagnosis not present

## 2016-09-06 DIAGNOSIS — I10 Essential (primary) hypertension: Secondary | ICD-10-CM | POA: Diagnosis not present

## 2016-09-06 DIAGNOSIS — E1165 Type 2 diabetes mellitus with hyperglycemia: Secondary | ICD-10-CM | POA: Diagnosis not present

## 2016-09-06 DIAGNOSIS — L84 Corns and callosities: Secondary | ICD-10-CM | POA: Diagnosis not present

## 2016-09-06 DIAGNOSIS — Z13220 Encounter for screening for lipoid disorders: Secondary | ICD-10-CM | POA: Diagnosis not present

## 2016-09-11 DIAGNOSIS — M2012 Hallux valgus (acquired), left foot: Secondary | ICD-10-CM | POA: Diagnosis not present

## 2016-09-11 DIAGNOSIS — M67472 Ganglion, left ankle and foot: Secondary | ICD-10-CM | POA: Diagnosis not present

## 2016-09-14 DIAGNOSIS — I5022 Chronic systolic (congestive) heart failure: Secondary | ICD-10-CM | POA: Diagnosis not present

## 2016-09-14 DIAGNOSIS — E782 Mixed hyperlipidemia: Secondary | ICD-10-CM | POA: Diagnosis not present

## 2016-09-14 DIAGNOSIS — I10 Essential (primary) hypertension: Secondary | ICD-10-CM | POA: Diagnosis not present

## 2016-09-14 DIAGNOSIS — I420 Dilated cardiomyopathy: Secondary | ICD-10-CM | POA: Diagnosis not present

## 2016-09-14 DIAGNOSIS — I255 Ischemic cardiomyopathy: Secondary | ICD-10-CM | POA: Diagnosis not present

## 2016-09-15 DIAGNOSIS — I5022 Chronic systolic (congestive) heart failure: Secondary | ICD-10-CM | POA: Diagnosis not present

## 2016-09-16 DIAGNOSIS — I5022 Chronic systolic (congestive) heart failure: Secondary | ICD-10-CM | POA: Diagnosis not present

## 2016-09-21 DIAGNOSIS — I10 Essential (primary) hypertension: Secondary | ICD-10-CM | POA: Diagnosis not present

## 2016-09-26 DIAGNOSIS — Z1231 Encounter for screening mammogram for malignant neoplasm of breast: Secondary | ICD-10-CM | POA: Diagnosis not present

## 2016-09-28 DIAGNOSIS — I5020 Unspecified systolic (congestive) heart failure: Secondary | ICD-10-CM | POA: Diagnosis not present

## 2016-09-28 DIAGNOSIS — I429 Cardiomyopathy, unspecified: Secondary | ICD-10-CM | POA: Diagnosis not present

## 2016-09-28 DIAGNOSIS — Z95810 Presence of automatic (implantable) cardiac defibrillator: Secondary | ICD-10-CM | POA: Diagnosis not present

## 2016-09-28 DIAGNOSIS — I10 Essential (primary) hypertension: Secondary | ICD-10-CM | POA: Diagnosis not present

## 2016-09-28 DIAGNOSIS — E782 Mixed hyperlipidemia: Secondary | ICD-10-CM | POA: Diagnosis not present

## 2016-10-02 DIAGNOSIS — I10 Essential (primary) hypertension: Secondary | ICD-10-CM | POA: Diagnosis not present

## 2016-10-02 DIAGNOSIS — R0602 Shortness of breath: Secondary | ICD-10-CM | POA: Diagnosis not present

## 2016-10-02 DIAGNOSIS — E119 Type 2 diabetes mellitus without complications: Secondary | ICD-10-CM | POA: Diagnosis not present

## 2016-10-02 DIAGNOSIS — J449 Chronic obstructive pulmonary disease, unspecified: Secondary | ICD-10-CM | POA: Diagnosis not present

## 2016-10-16 DIAGNOSIS — R0602 Shortness of breath: Secondary | ICD-10-CM | POA: Diagnosis not present

## 2016-10-16 DIAGNOSIS — Z87891 Personal history of nicotine dependence: Secondary | ICD-10-CM | POA: Diagnosis not present

## 2016-10-16 DIAGNOSIS — J449 Chronic obstructive pulmonary disease, unspecified: Secondary | ICD-10-CM | POA: Diagnosis not present

## 2016-10-18 DIAGNOSIS — H5203 Hypermetropia, bilateral: Secondary | ICD-10-CM | POA: Diagnosis not present

## 2016-10-18 DIAGNOSIS — G510 Bell's palsy: Secondary | ICD-10-CM | POA: Diagnosis not present

## 2016-10-25 ENCOUNTER — Encounter: Payer: Self-pay | Admitting: Cardiology

## 2016-10-29 DIAGNOSIS — R0602 Shortness of breath: Secondary | ICD-10-CM | POA: Diagnosis not present

## 2016-10-29 DIAGNOSIS — J181 Lobar pneumonia, unspecified organism: Secondary | ICD-10-CM | POA: Diagnosis not present

## 2016-10-30 DIAGNOSIS — I428 Other cardiomyopathies: Secondary | ICD-10-CM | POA: Diagnosis not present

## 2016-10-30 DIAGNOSIS — I361 Nonrheumatic tricuspid (valve) insufficiency: Secondary | ICD-10-CM | POA: Diagnosis not present

## 2016-10-30 DIAGNOSIS — Z87891 Personal history of nicotine dependence: Secondary | ICD-10-CM | POA: Diagnosis not present

## 2016-10-30 DIAGNOSIS — Z23 Encounter for immunization: Secondary | ICD-10-CM | POA: Diagnosis not present

## 2016-10-30 DIAGNOSIS — Z955 Presence of coronary angioplasty implant and graft: Secondary | ICD-10-CM | POA: Diagnosis not present

## 2016-10-30 DIAGNOSIS — I10 Essential (primary) hypertension: Secondary | ICD-10-CM | POA: Diagnosis not present

## 2016-10-30 DIAGNOSIS — R05 Cough: Secondary | ICD-10-CM | POA: Diagnosis not present

## 2016-10-30 DIAGNOSIS — Z885 Allergy status to narcotic agent status: Secondary | ICD-10-CM | POA: Diagnosis not present

## 2016-10-30 DIAGNOSIS — I509 Heart failure, unspecified: Secondary | ICD-10-CM | POA: Diagnosis not present

## 2016-10-30 DIAGNOSIS — J181 Lobar pneumonia, unspecified organism: Secondary | ICD-10-CM | POA: Diagnosis not present

## 2016-10-30 DIAGNOSIS — I340 Nonrheumatic mitral (valve) insufficiency: Secondary | ICD-10-CM | POA: Diagnosis not present

## 2016-10-30 DIAGNOSIS — I5043 Acute on chronic combined systolic (congestive) and diastolic (congestive) heart failure: Secondary | ICD-10-CM | POA: Diagnosis not present

## 2016-10-30 DIAGNOSIS — I429 Cardiomyopathy, unspecified: Secondary | ICD-10-CM | POA: Diagnosis not present

## 2016-10-30 DIAGNOSIS — E119 Type 2 diabetes mellitus without complications: Secondary | ICD-10-CM | POA: Diagnosis not present

## 2016-10-30 DIAGNOSIS — Z79899 Other long term (current) drug therapy: Secondary | ICD-10-CM | POA: Diagnosis not present

## 2016-10-30 DIAGNOSIS — J449 Chronic obstructive pulmonary disease, unspecified: Secondary | ICD-10-CM | POA: Diagnosis not present

## 2016-10-30 DIAGNOSIS — M1990 Unspecified osteoarthritis, unspecified site: Secondary | ICD-10-CM | POA: Diagnosis not present

## 2016-10-30 DIAGNOSIS — E7800 Pure hypercholesterolemia, unspecified: Secondary | ICD-10-CM | POA: Diagnosis not present

## 2016-10-30 DIAGNOSIS — I110 Hypertensive heart disease with heart failure: Secondary | ICD-10-CM | POA: Diagnosis not present

## 2016-10-30 DIAGNOSIS — I739 Peripheral vascular disease, unspecified: Secondary | ICD-10-CM | POA: Diagnosis not present

## 2016-10-30 DIAGNOSIS — J440 Chronic obstructive pulmonary disease with acute lower respiratory infection: Secondary | ICD-10-CM | POA: Diagnosis not present

## 2016-10-30 DIAGNOSIS — J189 Pneumonia, unspecified organism: Secondary | ICD-10-CM | POA: Diagnosis not present

## 2016-10-30 DIAGNOSIS — J9601 Acute respiratory failure with hypoxia: Secondary | ICD-10-CM | POA: Diagnosis not present

## 2016-10-30 DIAGNOSIS — R0602 Shortness of breath: Secondary | ICD-10-CM | POA: Diagnosis not present

## 2016-10-30 DIAGNOSIS — Z95810 Presence of automatic (implantable) cardiac defibrillator: Secondary | ICD-10-CM | POA: Diagnosis not present

## 2016-10-30 DIAGNOSIS — E1140 Type 2 diabetes mellitus with diabetic neuropathy, unspecified: Secondary | ICD-10-CM | POA: Diagnosis not present

## 2016-10-30 DIAGNOSIS — J159 Unspecified bacterial pneumonia: Secondary | ICD-10-CM | POA: Diagnosis not present

## 2016-11-01 DIAGNOSIS — J449 Chronic obstructive pulmonary disease, unspecified: Secondary | ICD-10-CM | POA: Diagnosis not present

## 2016-11-01 DIAGNOSIS — J189 Pneumonia, unspecified organism: Secondary | ICD-10-CM

## 2016-11-01 DIAGNOSIS — Z95810 Presence of automatic (implantable) cardiac defibrillator: Secondary | ICD-10-CM

## 2016-11-01 DIAGNOSIS — I739 Peripheral vascular disease, unspecified: Secondary | ICD-10-CM

## 2016-11-01 DIAGNOSIS — E119 Type 2 diabetes mellitus without complications: Secondary | ICD-10-CM | POA: Diagnosis not present

## 2016-11-01 DIAGNOSIS — I10 Essential (primary) hypertension: Secondary | ICD-10-CM

## 2016-11-01 DIAGNOSIS — I429 Cardiomyopathy, unspecified: Secondary | ICD-10-CM | POA: Diagnosis not present

## 2016-11-01 DIAGNOSIS — I5043 Acute on chronic combined systolic (congestive) and diastolic (congestive) heart failure: Secondary | ICD-10-CM | POA: Diagnosis not present

## 2016-11-05 DIAGNOSIS — I739 Peripheral vascular disease, unspecified: Secondary | ICD-10-CM | POA: Diagnosis not present

## 2016-11-05 DIAGNOSIS — I110 Hypertensive heart disease with heart failure: Secondary | ICD-10-CM | POA: Diagnosis not present

## 2016-11-05 DIAGNOSIS — J181 Lobar pneumonia, unspecified organism: Secondary | ICD-10-CM | POA: Diagnosis not present

## 2016-11-05 DIAGNOSIS — I5043 Acute on chronic combined systolic (congestive) and diastolic (congestive) heart failure: Secondary | ICD-10-CM | POA: Diagnosis not present

## 2016-11-05 DIAGNOSIS — M1990 Unspecified osteoarthritis, unspecified site: Secondary | ICD-10-CM | POA: Diagnosis not present

## 2016-11-05 DIAGNOSIS — I429 Cardiomyopathy, unspecified: Secondary | ICD-10-CM | POA: Diagnosis not present

## 2016-11-05 DIAGNOSIS — Z79899 Other long term (current) drug therapy: Secondary | ICD-10-CM | POA: Diagnosis not present

## 2016-11-05 DIAGNOSIS — E7800 Pure hypercholesterolemia, unspecified: Secondary | ICD-10-CM | POA: Diagnosis not present

## 2016-11-05 DIAGNOSIS — J440 Chronic obstructive pulmonary disease with acute lower respiratory infection: Secondary | ICD-10-CM | POA: Diagnosis not present

## 2016-11-05 DIAGNOSIS — E1140 Type 2 diabetes mellitus with diabetic neuropathy, unspecified: Secondary | ICD-10-CM | POA: Diagnosis not present

## 2016-11-05 DIAGNOSIS — Z955 Presence of coronary angioplasty implant and graft: Secondary | ICD-10-CM | POA: Diagnosis not present

## 2016-11-05 DIAGNOSIS — Z87891 Personal history of nicotine dependence: Secondary | ICD-10-CM | POA: Diagnosis not present

## 2016-11-05 DIAGNOSIS — Z23 Encounter for immunization: Secondary | ICD-10-CM | POA: Diagnosis not present

## 2016-11-05 DIAGNOSIS — Z885 Allergy status to narcotic agent status: Secondary | ICD-10-CM | POA: Diagnosis not present

## 2016-11-05 DIAGNOSIS — J9601 Acute respiratory failure with hypoxia: Secondary | ICD-10-CM | POA: Diagnosis not present

## 2016-11-05 DIAGNOSIS — Z95810 Presence of automatic (implantable) cardiac defibrillator: Secondary | ICD-10-CM | POA: Diagnosis not present

## 2016-11-07 DIAGNOSIS — Z87891 Personal history of nicotine dependence: Secondary | ICD-10-CM | POA: Diagnosis not present

## 2016-11-07 DIAGNOSIS — E1142 Type 2 diabetes mellitus with diabetic polyneuropathy: Secondary | ICD-10-CM | POA: Diagnosis not present

## 2016-11-07 DIAGNOSIS — I5043 Acute on chronic combined systolic (congestive) and diastolic (congestive) heart failure: Secondary | ICD-10-CM | POA: Diagnosis not present

## 2016-11-07 DIAGNOSIS — M1991 Primary osteoarthritis, unspecified site: Secondary | ICD-10-CM | POA: Diagnosis not present

## 2016-11-07 DIAGNOSIS — I110 Hypertensive heart disease with heart failure: Secondary | ICD-10-CM | POA: Diagnosis not present

## 2016-11-07 DIAGNOSIS — J449 Chronic obstructive pulmonary disease, unspecified: Secondary | ICD-10-CM | POA: Diagnosis not present

## 2016-11-07 DIAGNOSIS — Z7951 Long term (current) use of inhaled steroids: Secondary | ICD-10-CM | POA: Diagnosis not present

## 2016-11-07 DIAGNOSIS — E1151 Type 2 diabetes mellitus with diabetic peripheral angiopathy without gangrene: Secondary | ICD-10-CM | POA: Diagnosis not present

## 2016-11-07 DIAGNOSIS — I429 Cardiomyopathy, unspecified: Secondary | ICD-10-CM | POA: Diagnosis not present

## 2016-11-07 DIAGNOSIS — Z7984 Long term (current) use of oral hypoglycemic drugs: Secondary | ICD-10-CM | POA: Diagnosis not present

## 2016-11-07 DIAGNOSIS — Z87440 Personal history of urinary (tract) infections: Secondary | ICD-10-CM | POA: Diagnosis not present

## 2016-11-09 ENCOUNTER — Ambulatory Visit: Payer: Medicare Other | Admitting: Family

## 2016-11-09 ENCOUNTER — Ambulatory Visit (HOSPITAL_COMMUNITY): Payer: Medicare Other

## 2016-11-10 DIAGNOSIS — I429 Cardiomyopathy, unspecified: Secondary | ICD-10-CM | POA: Diagnosis not present

## 2016-11-10 DIAGNOSIS — Z7984 Long term (current) use of oral hypoglycemic drugs: Secondary | ICD-10-CM | POA: Diagnosis not present

## 2016-11-10 DIAGNOSIS — Z87891 Personal history of nicotine dependence: Secondary | ICD-10-CM | POA: Diagnosis not present

## 2016-11-10 DIAGNOSIS — J449 Chronic obstructive pulmonary disease, unspecified: Secondary | ICD-10-CM | POA: Diagnosis not present

## 2016-11-10 DIAGNOSIS — Z7951 Long term (current) use of inhaled steroids: Secondary | ICD-10-CM | POA: Diagnosis not present

## 2016-11-10 DIAGNOSIS — I110 Hypertensive heart disease with heart failure: Secondary | ICD-10-CM | POA: Diagnosis not present

## 2016-11-10 DIAGNOSIS — Z87440 Personal history of urinary (tract) infections: Secondary | ICD-10-CM | POA: Diagnosis not present

## 2016-11-10 DIAGNOSIS — M1991 Primary osteoarthritis, unspecified site: Secondary | ICD-10-CM | POA: Diagnosis not present

## 2016-11-10 DIAGNOSIS — E1151 Type 2 diabetes mellitus with diabetic peripheral angiopathy without gangrene: Secondary | ICD-10-CM | POA: Diagnosis not present

## 2016-11-10 DIAGNOSIS — E1142 Type 2 diabetes mellitus with diabetic polyneuropathy: Secondary | ICD-10-CM | POA: Diagnosis not present

## 2016-11-10 DIAGNOSIS — I5043 Acute on chronic combined systolic (congestive) and diastolic (congestive) heart failure: Secondary | ICD-10-CM | POA: Diagnosis not present

## 2016-11-13 DIAGNOSIS — Z7984 Long term (current) use of oral hypoglycemic drugs: Secondary | ICD-10-CM | POA: Diagnosis not present

## 2016-11-13 DIAGNOSIS — J449 Chronic obstructive pulmonary disease, unspecified: Secondary | ICD-10-CM | POA: Diagnosis not present

## 2016-11-13 DIAGNOSIS — I429 Cardiomyopathy, unspecified: Secondary | ICD-10-CM | POA: Diagnosis not present

## 2016-11-13 DIAGNOSIS — E1142 Type 2 diabetes mellitus with diabetic polyneuropathy: Secondary | ICD-10-CM | POA: Diagnosis not present

## 2016-11-13 DIAGNOSIS — Z7951 Long term (current) use of inhaled steroids: Secondary | ICD-10-CM | POA: Diagnosis not present

## 2016-11-13 DIAGNOSIS — Z87891 Personal history of nicotine dependence: Secondary | ICD-10-CM | POA: Diagnosis not present

## 2016-11-13 DIAGNOSIS — I110 Hypertensive heart disease with heart failure: Secondary | ICD-10-CM | POA: Diagnosis not present

## 2016-11-13 DIAGNOSIS — Z87440 Personal history of urinary (tract) infections: Secondary | ICD-10-CM | POA: Diagnosis not present

## 2016-11-13 DIAGNOSIS — I5043 Acute on chronic combined systolic (congestive) and diastolic (congestive) heart failure: Secondary | ICD-10-CM | POA: Diagnosis not present

## 2016-11-13 DIAGNOSIS — E1151 Type 2 diabetes mellitus with diabetic peripheral angiopathy without gangrene: Secondary | ICD-10-CM | POA: Diagnosis not present

## 2016-11-13 DIAGNOSIS — M1991 Primary osteoarthritis, unspecified site: Secondary | ICD-10-CM | POA: Diagnosis not present

## 2016-11-14 DIAGNOSIS — D510 Vitamin B12 deficiency anemia due to intrinsic factor deficiency: Secondary | ICD-10-CM | POA: Diagnosis not present

## 2016-11-14 DIAGNOSIS — J189 Pneumonia, unspecified organism: Secondary | ICD-10-CM | POA: Diagnosis not present

## 2016-11-14 DIAGNOSIS — J449 Chronic obstructive pulmonary disease, unspecified: Secondary | ICD-10-CM | POA: Diagnosis not present

## 2016-11-14 DIAGNOSIS — I509 Heart failure, unspecified: Secondary | ICD-10-CM | POA: Diagnosis not present

## 2016-11-14 DIAGNOSIS — E1143 Type 2 diabetes mellitus with diabetic autonomic (poly)neuropathy: Secondary | ICD-10-CM | POA: Diagnosis not present

## 2016-11-15 DIAGNOSIS — E1151 Type 2 diabetes mellitus with diabetic peripheral angiopathy without gangrene: Secondary | ICD-10-CM | POA: Diagnosis not present

## 2016-11-15 DIAGNOSIS — M1991 Primary osteoarthritis, unspecified site: Secondary | ICD-10-CM | POA: Diagnosis not present

## 2016-11-15 DIAGNOSIS — I429 Cardiomyopathy, unspecified: Secondary | ICD-10-CM | POA: Diagnosis not present

## 2016-11-15 DIAGNOSIS — I5043 Acute on chronic combined systolic (congestive) and diastolic (congestive) heart failure: Secondary | ICD-10-CM | POA: Diagnosis not present

## 2016-11-15 DIAGNOSIS — Z7951 Long term (current) use of inhaled steroids: Secondary | ICD-10-CM | POA: Diagnosis not present

## 2016-11-15 DIAGNOSIS — Z87891 Personal history of nicotine dependence: Secondary | ICD-10-CM | POA: Diagnosis not present

## 2016-11-15 DIAGNOSIS — I110 Hypertensive heart disease with heart failure: Secondary | ICD-10-CM | POA: Diagnosis not present

## 2016-11-15 DIAGNOSIS — J449 Chronic obstructive pulmonary disease, unspecified: Secondary | ICD-10-CM | POA: Diagnosis not present

## 2016-11-15 DIAGNOSIS — Z87440 Personal history of urinary (tract) infections: Secondary | ICD-10-CM | POA: Diagnosis not present

## 2016-11-15 DIAGNOSIS — E1142 Type 2 diabetes mellitus with diabetic polyneuropathy: Secondary | ICD-10-CM | POA: Diagnosis not present

## 2016-11-15 DIAGNOSIS — Z7984 Long term (current) use of oral hypoglycemic drugs: Secondary | ICD-10-CM | POA: Diagnosis not present

## 2016-11-16 DIAGNOSIS — I110 Hypertensive heart disease with heart failure: Secondary | ICD-10-CM | POA: Diagnosis not present

## 2016-11-16 DIAGNOSIS — Z7951 Long term (current) use of inhaled steroids: Secondary | ICD-10-CM | POA: Diagnosis not present

## 2016-11-16 DIAGNOSIS — Z87891 Personal history of nicotine dependence: Secondary | ICD-10-CM | POA: Diagnosis not present

## 2016-11-16 DIAGNOSIS — J449 Chronic obstructive pulmonary disease, unspecified: Secondary | ICD-10-CM | POA: Diagnosis not present

## 2016-11-16 DIAGNOSIS — I429 Cardiomyopathy, unspecified: Secondary | ICD-10-CM | POA: Diagnosis not present

## 2016-11-16 DIAGNOSIS — M1991 Primary osteoarthritis, unspecified site: Secondary | ICD-10-CM | POA: Diagnosis not present

## 2016-11-16 DIAGNOSIS — E1151 Type 2 diabetes mellitus with diabetic peripheral angiopathy without gangrene: Secondary | ICD-10-CM | POA: Diagnosis not present

## 2016-11-16 DIAGNOSIS — E1142 Type 2 diabetes mellitus with diabetic polyneuropathy: Secondary | ICD-10-CM | POA: Diagnosis not present

## 2016-11-16 DIAGNOSIS — Z87440 Personal history of urinary (tract) infections: Secondary | ICD-10-CM | POA: Diagnosis not present

## 2016-11-16 DIAGNOSIS — I5043 Acute on chronic combined systolic (congestive) and diastolic (congestive) heart failure: Secondary | ICD-10-CM | POA: Diagnosis not present

## 2016-11-16 DIAGNOSIS — Z7984 Long term (current) use of oral hypoglycemic drugs: Secondary | ICD-10-CM | POA: Diagnosis not present

## 2016-11-17 DIAGNOSIS — I5043 Acute on chronic combined systolic (congestive) and diastolic (congestive) heart failure: Secondary | ICD-10-CM | POA: Diagnosis not present

## 2016-11-17 DIAGNOSIS — Z87891 Personal history of nicotine dependence: Secondary | ICD-10-CM | POA: Diagnosis not present

## 2016-11-17 DIAGNOSIS — Z7951 Long term (current) use of inhaled steroids: Secondary | ICD-10-CM | POA: Diagnosis not present

## 2016-11-17 DIAGNOSIS — Z7984 Long term (current) use of oral hypoglycemic drugs: Secondary | ICD-10-CM | POA: Diagnosis not present

## 2016-11-17 DIAGNOSIS — I110 Hypertensive heart disease with heart failure: Secondary | ICD-10-CM | POA: Diagnosis not present

## 2016-11-17 DIAGNOSIS — I429 Cardiomyopathy, unspecified: Secondary | ICD-10-CM | POA: Diagnosis not present

## 2016-11-17 DIAGNOSIS — M1991 Primary osteoarthritis, unspecified site: Secondary | ICD-10-CM | POA: Diagnosis not present

## 2016-11-17 DIAGNOSIS — Z87440 Personal history of urinary (tract) infections: Secondary | ICD-10-CM | POA: Diagnosis not present

## 2016-11-17 DIAGNOSIS — J449 Chronic obstructive pulmonary disease, unspecified: Secondary | ICD-10-CM | POA: Diagnosis not present

## 2016-11-17 DIAGNOSIS — E1151 Type 2 diabetes mellitus with diabetic peripheral angiopathy without gangrene: Secondary | ICD-10-CM | POA: Diagnosis not present

## 2016-11-17 DIAGNOSIS — E1142 Type 2 diabetes mellitus with diabetic polyneuropathy: Secondary | ICD-10-CM | POA: Diagnosis not present

## 2016-11-20 DIAGNOSIS — E1142 Type 2 diabetes mellitus with diabetic polyneuropathy: Secondary | ICD-10-CM | POA: Diagnosis not present

## 2016-11-20 DIAGNOSIS — Z87440 Personal history of urinary (tract) infections: Secondary | ICD-10-CM | POA: Diagnosis not present

## 2016-11-20 DIAGNOSIS — E1151 Type 2 diabetes mellitus with diabetic peripheral angiopathy without gangrene: Secondary | ICD-10-CM | POA: Diagnosis not present

## 2016-11-20 DIAGNOSIS — I429 Cardiomyopathy, unspecified: Secondary | ICD-10-CM | POA: Diagnosis not present

## 2016-11-20 DIAGNOSIS — J449 Chronic obstructive pulmonary disease, unspecified: Secondary | ICD-10-CM | POA: Diagnosis not present

## 2016-11-20 DIAGNOSIS — M1991 Primary osteoarthritis, unspecified site: Secondary | ICD-10-CM | POA: Diagnosis not present

## 2016-11-20 DIAGNOSIS — Z7951 Long term (current) use of inhaled steroids: Secondary | ICD-10-CM | POA: Diagnosis not present

## 2016-11-20 DIAGNOSIS — Z7984 Long term (current) use of oral hypoglycemic drugs: Secondary | ICD-10-CM | POA: Diagnosis not present

## 2016-11-20 DIAGNOSIS — I5043 Acute on chronic combined systolic (congestive) and diastolic (congestive) heart failure: Secondary | ICD-10-CM | POA: Diagnosis not present

## 2016-11-20 DIAGNOSIS — I110 Hypertensive heart disease with heart failure: Secondary | ICD-10-CM | POA: Diagnosis not present

## 2016-11-20 DIAGNOSIS — Z87891 Personal history of nicotine dependence: Secondary | ICD-10-CM | POA: Diagnosis not present

## 2016-11-21 DIAGNOSIS — J449 Chronic obstructive pulmonary disease, unspecified: Secondary | ICD-10-CM | POA: Diagnosis not present

## 2016-11-21 DIAGNOSIS — M1991 Primary osteoarthritis, unspecified site: Secondary | ICD-10-CM | POA: Diagnosis not present

## 2016-11-21 DIAGNOSIS — E1151 Type 2 diabetes mellitus with diabetic peripheral angiopathy without gangrene: Secondary | ICD-10-CM | POA: Diagnosis not present

## 2016-11-21 DIAGNOSIS — Z7984 Long term (current) use of oral hypoglycemic drugs: Secondary | ICD-10-CM | POA: Diagnosis not present

## 2016-11-21 DIAGNOSIS — Z87440 Personal history of urinary (tract) infections: Secondary | ICD-10-CM | POA: Diagnosis not present

## 2016-11-21 DIAGNOSIS — I110 Hypertensive heart disease with heart failure: Secondary | ICD-10-CM | POA: Diagnosis not present

## 2016-11-21 DIAGNOSIS — I5043 Acute on chronic combined systolic (congestive) and diastolic (congestive) heart failure: Secondary | ICD-10-CM | POA: Diagnosis not present

## 2016-11-21 DIAGNOSIS — I429 Cardiomyopathy, unspecified: Secondary | ICD-10-CM | POA: Diagnosis not present

## 2016-11-21 DIAGNOSIS — E1142 Type 2 diabetes mellitus with diabetic polyneuropathy: Secondary | ICD-10-CM | POA: Diagnosis not present

## 2016-11-21 DIAGNOSIS — Z7951 Long term (current) use of inhaled steroids: Secondary | ICD-10-CM | POA: Diagnosis not present

## 2016-11-21 DIAGNOSIS — Z87891 Personal history of nicotine dependence: Secondary | ICD-10-CM | POA: Diagnosis not present

## 2016-11-22 DIAGNOSIS — Z87440 Personal history of urinary (tract) infections: Secondary | ICD-10-CM | POA: Diagnosis not present

## 2016-11-22 DIAGNOSIS — I5043 Acute on chronic combined systolic (congestive) and diastolic (congestive) heart failure: Secondary | ICD-10-CM | POA: Diagnosis not present

## 2016-11-22 DIAGNOSIS — E1142 Type 2 diabetes mellitus with diabetic polyneuropathy: Secondary | ICD-10-CM | POA: Diagnosis not present

## 2016-11-22 DIAGNOSIS — M1991 Primary osteoarthritis, unspecified site: Secondary | ICD-10-CM | POA: Diagnosis not present

## 2016-11-22 DIAGNOSIS — Z7984 Long term (current) use of oral hypoglycemic drugs: Secondary | ICD-10-CM | POA: Diagnosis not present

## 2016-11-22 DIAGNOSIS — Z7951 Long term (current) use of inhaled steroids: Secondary | ICD-10-CM | POA: Diagnosis not present

## 2016-11-22 DIAGNOSIS — I110 Hypertensive heart disease with heart failure: Secondary | ICD-10-CM | POA: Diagnosis not present

## 2016-11-22 DIAGNOSIS — I429 Cardiomyopathy, unspecified: Secondary | ICD-10-CM | POA: Diagnosis not present

## 2016-11-22 DIAGNOSIS — E1151 Type 2 diabetes mellitus with diabetic peripheral angiopathy without gangrene: Secondary | ICD-10-CM | POA: Diagnosis not present

## 2016-11-22 DIAGNOSIS — J449 Chronic obstructive pulmonary disease, unspecified: Secondary | ICD-10-CM | POA: Diagnosis not present

## 2016-11-22 DIAGNOSIS — Z87891 Personal history of nicotine dependence: Secondary | ICD-10-CM | POA: Diagnosis not present

## 2016-11-23 DIAGNOSIS — Z7951 Long term (current) use of inhaled steroids: Secondary | ICD-10-CM | POA: Diagnosis not present

## 2016-11-23 DIAGNOSIS — E1142 Type 2 diabetes mellitus with diabetic polyneuropathy: Secondary | ICD-10-CM | POA: Diagnosis not present

## 2016-11-23 DIAGNOSIS — Z87440 Personal history of urinary (tract) infections: Secondary | ICD-10-CM | POA: Diagnosis not present

## 2016-11-23 DIAGNOSIS — I110 Hypertensive heart disease with heart failure: Secondary | ICD-10-CM | POA: Diagnosis not present

## 2016-11-23 DIAGNOSIS — I5043 Acute on chronic combined systolic (congestive) and diastolic (congestive) heart failure: Secondary | ICD-10-CM | POA: Diagnosis not present

## 2016-11-23 DIAGNOSIS — J449 Chronic obstructive pulmonary disease, unspecified: Secondary | ICD-10-CM | POA: Diagnosis not present

## 2016-11-23 DIAGNOSIS — I429 Cardiomyopathy, unspecified: Secondary | ICD-10-CM | POA: Diagnosis not present

## 2016-11-23 DIAGNOSIS — Z7984 Long term (current) use of oral hypoglycemic drugs: Secondary | ICD-10-CM | POA: Diagnosis not present

## 2016-11-23 DIAGNOSIS — Z87891 Personal history of nicotine dependence: Secondary | ICD-10-CM | POA: Diagnosis not present

## 2016-11-23 DIAGNOSIS — E1151 Type 2 diabetes mellitus with diabetic peripheral angiopathy without gangrene: Secondary | ICD-10-CM | POA: Diagnosis not present

## 2016-11-23 DIAGNOSIS — M1991 Primary osteoarthritis, unspecified site: Secondary | ICD-10-CM | POA: Diagnosis not present

## 2016-11-24 DIAGNOSIS — J449 Chronic obstructive pulmonary disease, unspecified: Secondary | ICD-10-CM | POA: Diagnosis not present

## 2016-11-24 DIAGNOSIS — E1151 Type 2 diabetes mellitus with diabetic peripheral angiopathy without gangrene: Secondary | ICD-10-CM | POA: Diagnosis not present

## 2016-11-24 DIAGNOSIS — I5043 Acute on chronic combined systolic (congestive) and diastolic (congestive) heart failure: Secondary | ICD-10-CM | POA: Diagnosis not present

## 2016-11-24 DIAGNOSIS — Z87891 Personal history of nicotine dependence: Secondary | ICD-10-CM | POA: Diagnosis not present

## 2016-11-24 DIAGNOSIS — I110 Hypertensive heart disease with heart failure: Secondary | ICD-10-CM | POA: Diagnosis not present

## 2016-11-24 DIAGNOSIS — M1991 Primary osteoarthritis, unspecified site: Secondary | ICD-10-CM | POA: Diagnosis not present

## 2016-11-24 DIAGNOSIS — Z7984 Long term (current) use of oral hypoglycemic drugs: Secondary | ICD-10-CM | POA: Diagnosis not present

## 2016-11-24 DIAGNOSIS — Z87440 Personal history of urinary (tract) infections: Secondary | ICD-10-CM | POA: Diagnosis not present

## 2016-11-24 DIAGNOSIS — Z7951 Long term (current) use of inhaled steroids: Secondary | ICD-10-CM | POA: Diagnosis not present

## 2016-11-24 DIAGNOSIS — E1142 Type 2 diabetes mellitus with diabetic polyneuropathy: Secondary | ICD-10-CM | POA: Diagnosis not present

## 2016-11-24 DIAGNOSIS — I429 Cardiomyopathy, unspecified: Secondary | ICD-10-CM | POA: Diagnosis not present

## 2016-11-29 DIAGNOSIS — Z7951 Long term (current) use of inhaled steroids: Secondary | ICD-10-CM | POA: Diagnosis not present

## 2016-11-29 DIAGNOSIS — I429 Cardiomyopathy, unspecified: Secondary | ICD-10-CM | POA: Diagnosis not present

## 2016-11-29 DIAGNOSIS — E1151 Type 2 diabetes mellitus with diabetic peripheral angiopathy without gangrene: Secondary | ICD-10-CM | POA: Diagnosis not present

## 2016-11-29 DIAGNOSIS — Z7984 Long term (current) use of oral hypoglycemic drugs: Secondary | ICD-10-CM | POA: Diagnosis not present

## 2016-11-29 DIAGNOSIS — I5043 Acute on chronic combined systolic (congestive) and diastolic (congestive) heart failure: Secondary | ICD-10-CM | POA: Diagnosis not present

## 2016-11-29 DIAGNOSIS — J449 Chronic obstructive pulmonary disease, unspecified: Secondary | ICD-10-CM | POA: Diagnosis not present

## 2016-11-29 DIAGNOSIS — E1142 Type 2 diabetes mellitus with diabetic polyneuropathy: Secondary | ICD-10-CM | POA: Diagnosis not present

## 2016-11-29 DIAGNOSIS — I110 Hypertensive heart disease with heart failure: Secondary | ICD-10-CM | POA: Diagnosis not present

## 2016-11-29 DIAGNOSIS — M1991 Primary osteoarthritis, unspecified site: Secondary | ICD-10-CM | POA: Diagnosis not present

## 2016-11-29 DIAGNOSIS — Z87891 Personal history of nicotine dependence: Secondary | ICD-10-CM | POA: Diagnosis not present

## 2016-11-29 DIAGNOSIS — Z87440 Personal history of urinary (tract) infections: Secondary | ICD-10-CM | POA: Diagnosis not present

## 2016-11-30 DIAGNOSIS — I10 Essential (primary) hypertension: Secondary | ICD-10-CM | POA: Diagnosis not present

## 2016-11-30 DIAGNOSIS — I110 Hypertensive heart disease with heart failure: Secondary | ICD-10-CM | POA: Diagnosis not present

## 2016-11-30 DIAGNOSIS — Z7951 Long term (current) use of inhaled steroids: Secondary | ICD-10-CM | POA: Diagnosis not present

## 2016-11-30 DIAGNOSIS — E782 Mixed hyperlipidemia: Secondary | ICD-10-CM | POA: Diagnosis not present

## 2016-11-30 DIAGNOSIS — I429 Cardiomyopathy, unspecified: Secondary | ICD-10-CM | POA: Diagnosis not present

## 2016-11-30 DIAGNOSIS — E1151 Type 2 diabetes mellitus with diabetic peripheral angiopathy without gangrene: Secondary | ICD-10-CM | POA: Diagnosis not present

## 2016-11-30 DIAGNOSIS — I5043 Acute on chronic combined systolic (congestive) and diastolic (congestive) heart failure: Secondary | ICD-10-CM | POA: Diagnosis not present

## 2016-11-30 DIAGNOSIS — Z87440 Personal history of urinary (tract) infections: Secondary | ICD-10-CM | POA: Diagnosis not present

## 2016-11-30 DIAGNOSIS — Z95810 Presence of automatic (implantable) cardiac defibrillator: Secondary | ICD-10-CM | POA: Diagnosis not present

## 2016-11-30 DIAGNOSIS — M1991 Primary osteoarthritis, unspecified site: Secondary | ICD-10-CM | POA: Diagnosis not present

## 2016-11-30 DIAGNOSIS — E1142 Type 2 diabetes mellitus with diabetic polyneuropathy: Secondary | ICD-10-CM | POA: Diagnosis not present

## 2016-11-30 DIAGNOSIS — I5020 Unspecified systolic (congestive) heart failure: Secondary | ICD-10-CM | POA: Diagnosis not present

## 2016-11-30 DIAGNOSIS — Z87891 Personal history of nicotine dependence: Secondary | ICD-10-CM | POA: Diagnosis not present

## 2016-11-30 DIAGNOSIS — Z7984 Long term (current) use of oral hypoglycemic drugs: Secondary | ICD-10-CM | POA: Diagnosis not present

## 2016-11-30 DIAGNOSIS — J449 Chronic obstructive pulmonary disease, unspecified: Secondary | ICD-10-CM | POA: Diagnosis not present

## 2016-12-01 DIAGNOSIS — I429 Cardiomyopathy, unspecified: Secondary | ICD-10-CM | POA: Diagnosis not present

## 2016-12-01 DIAGNOSIS — M1991 Primary osteoarthritis, unspecified site: Secondary | ICD-10-CM | POA: Diagnosis not present

## 2016-12-01 DIAGNOSIS — Z87440 Personal history of urinary (tract) infections: Secondary | ICD-10-CM | POA: Diagnosis not present

## 2016-12-01 DIAGNOSIS — I110 Hypertensive heart disease with heart failure: Secondary | ICD-10-CM | POA: Diagnosis not present

## 2016-12-01 DIAGNOSIS — J449 Chronic obstructive pulmonary disease, unspecified: Secondary | ICD-10-CM | POA: Diagnosis not present

## 2016-12-01 DIAGNOSIS — Z7984 Long term (current) use of oral hypoglycemic drugs: Secondary | ICD-10-CM | POA: Diagnosis not present

## 2016-12-01 DIAGNOSIS — E1151 Type 2 diabetes mellitus with diabetic peripheral angiopathy without gangrene: Secondary | ICD-10-CM | POA: Diagnosis not present

## 2016-12-01 DIAGNOSIS — Z87891 Personal history of nicotine dependence: Secondary | ICD-10-CM | POA: Diagnosis not present

## 2016-12-01 DIAGNOSIS — E1142 Type 2 diabetes mellitus with diabetic polyneuropathy: Secondary | ICD-10-CM | POA: Diagnosis not present

## 2016-12-01 DIAGNOSIS — I5043 Acute on chronic combined systolic (congestive) and diastolic (congestive) heart failure: Secondary | ICD-10-CM | POA: Diagnosis not present

## 2016-12-01 DIAGNOSIS — Z7951 Long term (current) use of inhaled steroids: Secondary | ICD-10-CM | POA: Diagnosis not present

## 2016-12-05 DIAGNOSIS — M1991 Primary osteoarthritis, unspecified site: Secondary | ICD-10-CM | POA: Diagnosis not present

## 2016-12-05 DIAGNOSIS — Z7951 Long term (current) use of inhaled steroids: Secondary | ICD-10-CM | POA: Diagnosis not present

## 2016-12-05 DIAGNOSIS — I110 Hypertensive heart disease with heart failure: Secondary | ICD-10-CM | POA: Diagnosis not present

## 2016-12-05 DIAGNOSIS — Z87891 Personal history of nicotine dependence: Secondary | ICD-10-CM | POA: Diagnosis not present

## 2016-12-05 DIAGNOSIS — I429 Cardiomyopathy, unspecified: Secondary | ICD-10-CM | POA: Diagnosis not present

## 2016-12-05 DIAGNOSIS — E1142 Type 2 diabetes mellitus with diabetic polyneuropathy: Secondary | ICD-10-CM | POA: Diagnosis not present

## 2016-12-05 DIAGNOSIS — J449 Chronic obstructive pulmonary disease, unspecified: Secondary | ICD-10-CM | POA: Diagnosis not present

## 2016-12-05 DIAGNOSIS — E1151 Type 2 diabetes mellitus with diabetic peripheral angiopathy without gangrene: Secondary | ICD-10-CM | POA: Diagnosis not present

## 2016-12-05 DIAGNOSIS — Z7984 Long term (current) use of oral hypoglycemic drugs: Secondary | ICD-10-CM | POA: Diagnosis not present

## 2016-12-05 DIAGNOSIS — Z87440 Personal history of urinary (tract) infections: Secondary | ICD-10-CM | POA: Diagnosis not present

## 2016-12-05 DIAGNOSIS — I5043 Acute on chronic combined systolic (congestive) and diastolic (congestive) heart failure: Secondary | ICD-10-CM | POA: Diagnosis not present

## 2016-12-07 DIAGNOSIS — E782 Mixed hyperlipidemia: Secondary | ICD-10-CM | POA: Diagnosis not present

## 2016-12-07 DIAGNOSIS — D510 Vitamin B12 deficiency anemia due to intrinsic factor deficiency: Secondary | ICD-10-CM | POA: Diagnosis not present

## 2016-12-07 DIAGNOSIS — Z95810 Presence of automatic (implantable) cardiac defibrillator: Secondary | ICD-10-CM | POA: Diagnosis not present

## 2016-12-07 DIAGNOSIS — I10 Essential (primary) hypertension: Secondary | ICD-10-CM | POA: Diagnosis not present

## 2016-12-07 DIAGNOSIS — I5020 Unspecified systolic (congestive) heart failure: Secondary | ICD-10-CM | POA: Diagnosis not present

## 2016-12-07 DIAGNOSIS — I429 Cardiomyopathy, unspecified: Secondary | ICD-10-CM | POA: Diagnosis not present

## 2016-12-11 DIAGNOSIS — Z87440 Personal history of urinary (tract) infections: Secondary | ICD-10-CM | POA: Diagnosis not present

## 2016-12-11 DIAGNOSIS — E1142 Type 2 diabetes mellitus with diabetic polyneuropathy: Secondary | ICD-10-CM | POA: Diagnosis not present

## 2016-12-11 DIAGNOSIS — J449 Chronic obstructive pulmonary disease, unspecified: Secondary | ICD-10-CM | POA: Diagnosis not present

## 2016-12-11 DIAGNOSIS — Z7984 Long term (current) use of oral hypoglycemic drugs: Secondary | ICD-10-CM | POA: Diagnosis not present

## 2016-12-11 DIAGNOSIS — Z87891 Personal history of nicotine dependence: Secondary | ICD-10-CM | POA: Diagnosis not present

## 2016-12-11 DIAGNOSIS — E1151 Type 2 diabetes mellitus with diabetic peripheral angiopathy without gangrene: Secondary | ICD-10-CM | POA: Diagnosis not present

## 2016-12-11 DIAGNOSIS — I5043 Acute on chronic combined systolic (congestive) and diastolic (congestive) heart failure: Secondary | ICD-10-CM | POA: Diagnosis not present

## 2016-12-11 DIAGNOSIS — Z7951 Long term (current) use of inhaled steroids: Secondary | ICD-10-CM | POA: Diagnosis not present

## 2016-12-11 DIAGNOSIS — M1991 Primary osteoarthritis, unspecified site: Secondary | ICD-10-CM | POA: Diagnosis not present

## 2016-12-11 DIAGNOSIS — I429 Cardiomyopathy, unspecified: Secondary | ICD-10-CM | POA: Diagnosis not present

## 2016-12-11 DIAGNOSIS — I110 Hypertensive heart disease with heart failure: Secondary | ICD-10-CM | POA: Diagnosis not present

## 2016-12-14 DIAGNOSIS — J449 Chronic obstructive pulmonary disease, unspecified: Secondary | ICD-10-CM | POA: Diagnosis not present

## 2016-12-14 DIAGNOSIS — Z87440 Personal history of urinary (tract) infections: Secondary | ICD-10-CM | POA: Diagnosis not present

## 2016-12-14 DIAGNOSIS — E1142 Type 2 diabetes mellitus with diabetic polyneuropathy: Secondary | ICD-10-CM | POA: Diagnosis not present

## 2016-12-14 DIAGNOSIS — Z7984 Long term (current) use of oral hypoglycemic drugs: Secondary | ICD-10-CM | POA: Diagnosis not present

## 2016-12-14 DIAGNOSIS — M1991 Primary osteoarthritis, unspecified site: Secondary | ICD-10-CM | POA: Diagnosis not present

## 2016-12-14 DIAGNOSIS — I429 Cardiomyopathy, unspecified: Secondary | ICD-10-CM | POA: Diagnosis not present

## 2016-12-14 DIAGNOSIS — I110 Hypertensive heart disease with heart failure: Secondary | ICD-10-CM | POA: Diagnosis not present

## 2016-12-14 DIAGNOSIS — Z87891 Personal history of nicotine dependence: Secondary | ICD-10-CM | POA: Diagnosis not present

## 2016-12-14 DIAGNOSIS — I5043 Acute on chronic combined systolic (congestive) and diastolic (congestive) heart failure: Secondary | ICD-10-CM | POA: Diagnosis not present

## 2016-12-14 DIAGNOSIS — Z7951 Long term (current) use of inhaled steroids: Secondary | ICD-10-CM | POA: Diagnosis not present

## 2016-12-14 DIAGNOSIS — E1151 Type 2 diabetes mellitus with diabetic peripheral angiopathy without gangrene: Secondary | ICD-10-CM | POA: Diagnosis not present

## 2016-12-15 ENCOUNTER — Encounter: Payer: Self-pay | Admitting: Vascular Surgery

## 2016-12-21 ENCOUNTER — Ambulatory Visit (INDEPENDENT_AMBULATORY_CARE_PROVIDER_SITE_OTHER)
Admission: RE | Admit: 2016-12-21 | Discharge: 2016-12-21 | Disposition: A | Discharge status: Home / Self Care | Payer: Medicare Other | Source: Ambulatory Visit | Attending: Vascular Surgery | Admitting: Vascular Surgery

## 2016-12-21 ENCOUNTER — Ambulatory Visit (INDEPENDENT_AMBULATORY_CARE_PROVIDER_SITE_OTHER): Payer: Medicare Other | Admitting: Vascular Surgery

## 2016-12-21 ENCOUNTER — Encounter: Payer: Self-pay | Admitting: Vascular Surgery

## 2016-12-21 ENCOUNTER — Ambulatory Visit (HOSPITAL_COMMUNITY)
Admission: RE | Admit: 2016-12-21 | Discharge: 2016-12-21 | Disposition: A | Discharge status: Home / Self Care | Payer: Medicare Other | Source: Ambulatory Visit | Attending: Vascular Surgery | Admitting: Vascular Surgery

## 2016-12-21 VITALS — BP 114/77 | HR 60 | Temp 97.0°F | Resp 16 | Ht 64.0 in | Wt 188.7 lb

## 2016-12-21 DIAGNOSIS — E1151 Type 2 diabetes mellitus with diabetic peripheral angiopathy without gangrene: Secondary | ICD-10-CM | POA: Diagnosis not present

## 2016-12-21 DIAGNOSIS — I739 Peripheral vascular disease, unspecified: Secondary | ICD-10-CM

## 2016-12-21 DIAGNOSIS — R0989 Other specified symptoms and signs involving the circulatory and respiratory systems: Secondary | ICD-10-CM | POA: Diagnosis present

## 2016-12-21 DIAGNOSIS — Z87891 Personal history of nicotine dependence: Secondary | ICD-10-CM | POA: Insufficient documentation

## 2016-12-21 DIAGNOSIS — I6523 Occlusion and stenosis of bilateral carotid arteries: Secondary | ICD-10-CM | POA: Diagnosis not present

## 2016-12-21 LAB — VAS US CAROTID
LCCADSYS: 108 cm/s
LEFT ECA DIAS: -12 cm/s
LICAPDIAS: -90 cm/s
LICAPSYS: -271 cm/s
Left CCA dist dias: 31 cm/s
Left CCA prox dias: 25 cm/s
Left CCA prox sys: 110 cm/s
Left ICA dist dias: -28 cm/s
Left ICA dist sys: -100 cm/s
RCCADSYS: -89 cm/s
RCCAPDIAS: 19 cm/s
RIGHT CCA MID DIAS: 22 cm/s
RIGHT ECA DIAS: -20 cm/s
Right CCA prox sys: 99 cm/s

## 2016-12-21 NOTE — Addendum Note (Signed)
Addended by: Lianne Cure A on: 12/21/2016 03:05 PM   Modules accepted: Orders

## 2016-12-21 NOTE — Progress Notes (Signed)
Patient is a 71 year old female who returns for follow-up today. She previously underwent left common iliac stent subsequently followed by left to right femoral-femoral bypass with right femoral endarterectomy. This was all in March 2016. She still has significant problems with neuropathy in her feet. Right foot is worse than the left. She occasionally stumbles with her right foot especially on carpet. She is currently on Lyrica and Neurontin which she states helps her neuropathy a little bit but she still has symptoms. She also complains of numbness and tingling in both hands. She was recently in the hospital with an episode of congestive heart failure and pneumonia. She feels better currently. We are also following her for moderate carotid stenosis. She has a known moderate right and left carotid stenosis. She denies any symptoms of TIA amaurosis or stroke. She is on aspirin daily.  Review of systems: She currently denies chest pain or shortness of breath.  Current Outpatient Prescriptions on File Prior to Visit  Medication Sig Dispense Refill  . acetaminophen (TYLENOL) 650 MG CR tablet Take 650 mg by mouth every 8 (eight) hours as needed for pain.    [name] albuterol (PROVENTIL HFA;VENTOLIN HFA) 108 (90 BASE) MCG/ACT inhaler Inhale 2 puffs into the lungs every 6 (six) hours as needed for wheezing or shortness of breath.    [name] aspirin 81 MG tablet Take 162 mg by mouth daily.     . calcium carbonate (OS-CAL) 600 MG TABS tablet Take 1,200 mg by mouth daily with breakfast.     . carvedilol (COREG) 12.5 MG tablet Take 12.5 mg by mouth 2 (two) times daily with a meal.    . Cholecalciferol (CVS VIT D 5000 HIGH-POTENCY PO) Take by mouth 2 (two) times a week.    . Cyanocobalamin 1000 MCG/ML KIT Inject as directed every 30 (thirty) days.    . digoxin (LANOXIN) 0.125 MG tablet Take 0.0625 mg by mouth 3 (three) times a week. Take 1/2 Tab Monday Wednesday and Friday    . Fluticasone-Salmeterol (ADVAIR) 250-50 MCG/DOSE  AEPB Inhale 2 puffs into the lungs daily.    . furosemide (LASIX) 40 MG tablet Take 40-80 mg by mouth daily. 68m on Monday and Tuesday and 40 mg all other days    . hydroxypropyl methylcellulose / hypromellose (ISOPTO TEARS / GONIOVISC) 2.5 % ophthalmic solution Place 1 drop into both eyes as needed for dry eyes.    .Marland Kitchenlisinopril (PRINIVIL,ZESTRIL) 5 MG tablet Take 2.5 mg by mouth daily.    .Marland KitchenLYRICA 75 MG capsule every morning.    . simvastatin (ZOCOR) 20 MG tablet Take 20 mg by mouth daily at 6 PM.     . spironolactone (ALDACTONE) 25 MG tablet Take 25 mg by mouth daily.    .Marland Kitchenacetaminophen-codeine (TYLENOL #3) 300-30 MG per tablet Take 1 tablet by mouth every 6 (six) hours as needed for moderate pain. (Patient not taking: Reported on 04/20/2016) 30 tablet 0  . DULoxetine (CYMBALTA) 30 MG capsule Take 60 mg by mouth at bedtime as needed and may repeat dose one time if needed. Take 2 tabs at night    . gabapentin (NEURONTIN) 300 MG capsule Take 300 mg by mouth 3 (three) times daily. Reported on 10/14/2015    . HYDROcodone-acetaminophen (NORCO/VICODIN) 5-325 MG per tablet Take 1 tablet by mouth every 6 (six) hours as needed. (Patient not taking: Reported on 10/14/2015) 20 tablet 0  . traZODone (DESYREL) 100 MG tablet Take 100 mg by mouth at bedtime. Reported on 10/14/2015    .  traZODone (DESYREL) 50 MG tablet 50 mg at bedtime. Reported on 10/14/2015     No current facility-administered medications on file prior to visit.     Past Medical History:  Diagnosis Date  . AICD (automatic cardioverter/defibrillator) present    Biotroniks  . Arthritis    knees with arthritis as well as has back, pt. remarks that her hands fall asleep when she is in the bed or in certain positions   . Bell's palsy   . Carotid artery occlusion   . CHF (congestive heart failure) ([place])   . COPD (chronic obstructive pulmonary disease) ([place])   . Diabetes mellitus without complication (HCC)    borderline  . GERD  (gastroesophageal reflux disease)    uses otc for occas. heartburn   . Hypertension   . Non-ischemic cardiomyopathy ([place])   . Pneumonia    hosp. in [place]-2015  . Shortness of breath dyspnea    Past Surgical History:  Procedure Laterality Date  . ABDOMINAL AORTAGRAM N/A 11/27/2014   Procedure: ABDOMINAL Maxcine Ham;  Surgeon: Elam Dutch, MD;  Location: [facility] CATH LAB;  Service: Cardiovascular;  Laterality: N/A;  . ABDOMINAL HYSTERECTOMY    . APPENDECTOMY    . CARDIAC CATHETERIZATION     02/20/13: Normal coronaries, moderate LV dysfunction, EF 35%. Medical RX ([facility])  . CARDIAC DEFIBRILLATOR PLACEMENT  Aug. 2014   dual chamber Biotronik ICD 05/06/13 ([facility], Dr. Minna Merritts)  . ENDARTERECTOMY FEMORAL Right 12/21/2014   Procedure: ENDARTERECTOMY FEMORAL;  Surgeon: Elam Dutch, MD;  Location: [place];  Service: Vascular;  Laterality: Right;  . FEMORAL-FEMORAL BYPASS GRAFT Bilateral 12/21/2014   Procedure: BYPASS GRAFT LEFT FEMORAL-RIGHT FEMORAL ARTERY;  Surgeon: Elam Dutch, MD;  Location: [place];  Service: Vascular;  Laterality: Bilateral;  . JOINT REPLACEMENT Bilateral    implants  . TUBAL LIGATION      Physical exam:  Vitals:   12/21/16 1405  BP: 114/77  Pulse: 60  Resp: 16  Temp: 97 F (36.1 C)  TempSrc: Oral  SpO2: 97%  Weight: 188 lb 11.2 oz (85.6 kg)  Height: 5' 4"  (1.626 m)    Neck: No carotid bruits new. Chest: Clear to auscultation bilaterally  Cardiac: Regular rate and rhythm without murmur  Abdomen: Soft nontender nondistended no mass  Extremities: 2+ femoral pulses absent popliteal and pedal pulses bilaterally, difficult to palpate brachial and radial pulses bilaterally  skin: No ulcer or rash  Neuro: Fairly flat affect symmetric upper and lower extremity motor strength 5 over 5  Data: Patient had bilateral ABIs performed today which were 0.86 on the left 0.86 on the right no significant change. She also had a carotid duplex exam performed which showed a  40-60% right internal carotid artery stenosis and a 60-80% left internal carotid artery stenosis with reversal of flow in the tibial artery. She also had a blood pressure gradient of 30 mmHg right greater than left. I reviewed and interpreted both of these studies.  Assessment: Peripheral arterial disease overall stable currently. Her femoral-femoral bypass is patent. She has adequate perfusion to both lower extremity is and does not really describe claudication symptoms currently.  Repeat ABIs in one year.  Carotid occlusive disease asymptomatic moderate stable bilateral carotid stenosis repeat carotid duplex 6 months. She will see our nurse practitioner that office visit.  Ruta Hinds, MD [facility] Office: [phone] Pager: [phone]

## 2017-03-29 ENCOUNTER — Telehealth: Payer: Self-pay | Admitting: Cardiology

## 2017-03-29 NOTE — Telephone Encounter (Signed)
S/w pt and she state that she did experience [facility] yesterday and decided to take an additional Lasix at which she states helped her out "some." She states she was lying down when I called her and was feeling ok as long as she was not up and exerting herself too much. Pt states that she had arranged for an appointment with Dr. Minna Merritts but is unsure of when it is and feels its later than her appointment with Dr. Geraldo Pitter on 7/16. I have encouraged pt to keep her appointment on July 16 and should she have worsening symptoms, she should present to the nearest hospital for evaluation.

## 2017-03-29 NOTE — Telephone Encounter (Signed)
Please call patient regarding weight gain and the [place] called and wants [place] to call patient. RN is faxing [place] her weight and symptoms sheet also. Tawanna Sat RN [phone] ext [phone]

## 2017-04-09 ENCOUNTER — Encounter: Payer: Self-pay | Admitting: Cardiology

## 2017-04-09 ENCOUNTER — Ambulatory Visit (INDEPENDENT_AMBULATORY_CARE_PROVIDER_SITE_OTHER): Payer: Medicare Other | Admitting: Cardiology

## 2017-04-09 DIAGNOSIS — I5043 Acute on chronic combined systolic (congestive) and diastolic (congestive) heart failure: Secondary | ICD-10-CM | POA: Diagnosis not present

## 2017-04-09 DIAGNOSIS — E785 Hyperlipidemia, unspecified: Secondary | ICD-10-CM | POA: Diagnosis not present

## 2017-04-09 DIAGNOSIS — I10 Essential (primary) hypertension: Secondary | ICD-10-CM | POA: Diagnosis not present

## 2017-04-09 DIAGNOSIS — Z95810 Presence of automatic (implantable) cardiac defibrillator: Secondary | ICD-10-CM

## 2017-04-09 DIAGNOSIS — I428 Other cardiomyopathies: Secondary | ICD-10-CM | POA: Diagnosis not present

## 2017-04-09 NOTE — Progress Notes (Signed)
Cardiology Office Note:    Date:  04/09/2017   ID:  Lisa [name], DOB 1946/06/12, MRN [id number]  PCP:  Angelina Sheriff, MD  Cardiologist:  Jenean Lindau, MD   Referring MD: Angelina Sheriff, MD     History of Present Illness:    Lisa [name] is a 71 y.o. female who is being seen today for the evaluation of Congestive heart failure. The patient is well familiar to me as I followed her in the clinic. She has history of essential hypertension and cardiomyopathy. She has a defibrillator. I'm not sure about her compliance with diet and medications. She mentions to me that she was gradually finding herself to be having increasing shortness of breath which she went to the emergency room. This was treated for congestive heart failure. She responded well to diuretic therapy and congested heart failure therapy and feels much better at this time. At the time of my evaluation she is alert awake oriented and in no distress and her sister accompanies her.  Past Medical History:  Diagnosis Date  . AICD (automatic cardioverter/defibrillator) present    Biotroniks  . Arthritis    knees with arthritis as well as has back, pt. remarks that her hands fall asleep when she is in the bed or in certain positions   . Bell's palsy   . Carotid artery occlusion   . CHF (congestive heart failure) ([place])   . COPD (chronic obstructive pulmonary disease) ([place])   . Diabetes mellitus without complication (HCC)    borderline  . GERD (gastroesophageal reflux disease)    uses otc for occas. heartburn   . Hyperlipidemia   . Hypertension   . Neuropathy   . Non-ischemic cardiomyopathy ([place])   . Pneumonia    hosp. in [place]-2015  . Shortness of breath dyspnea     Past Surgical History:  Procedure Laterality Date  . ABDOMINAL AORTAGRAM N/A 11/27/2014   Procedure: ABDOMINAL Maxcine Ham;  Surgeon: Elam Dutch, MD;  Location: [facility] CATH LAB;  Service: Cardiovascular;  Laterality: N/A;  .  ABDOMINAL HYSTERECTOMY    . APPENDECTOMY    . CARDIAC CATHETERIZATION     02/20/13: Normal coronaries, moderate LV dysfunction, EF 35%. Medical RX ([facility])  . CARDIAC DEFIBRILLATOR PLACEMENT  Aug. 2014   dual chamber Biotronik ICD 05/06/13 ([facility], Dr. Minna Merritts)  . ENDARTERECTOMY FEMORAL Right 12/21/2014   Procedure: ENDARTERECTOMY FEMORAL;  Surgeon: Elam Dutch, MD;  Location: [place];  Service: Vascular;  Laterality: Right;  . FEMORAL-FEMORAL BYPASS GRAFT Bilateral 12/21/2014   Procedure: BYPASS GRAFT LEFT FEMORAL-RIGHT FEMORAL ARTERY;  Surgeon: Elam Dutch, MD;  Location: [place];  Service: Vascular;  Laterality: Bilateral;  . JOINT REPLACEMENT Bilateral    implants  . TUBAL LIGATION      Current Medications: Current Meds  Medication Sig  . acetaminophen (TYLENOL) 650 MG CR tablet Take 650 mg by mouth every 8 (eight) hours as needed for pain.  [name] albuterol (PROVENTIL HFA;VENTOLIN HFA) 108 (90 BASE) MCG/ACT inhaler Inhale 2 puffs into the lungs every 6 (six) hours as needed for wheezing or shortness of breath.  [name] aspirin 81 MG tablet Take 162 mg by mouth daily.   . calcium carbonate (OS-CAL) 600 MG TABS tablet Take 1,200 mg by mouth daily with breakfast.   . carvedilol (COREG) 12.5 MG tablet Take 12.5 mg by mouth 2 (two) times daily with a meal.  . Cholecalciferol (CVS VIT D 5000 HIGH-POTENCY PO) Take by mouth  2 (two) times a week.  . digoxin (LANOXIN) 0.125 MG tablet Take 0.0625 mg by mouth 3 (three) times a week. Take 1/2 Tab Monday Wednesday and Friday  . DULoxetine (CYMBALTA) 30 MG capsule Take 60 mg by mouth at bedtime as needed and may repeat dose one time if needed. Take 2 tabs at night  . fluticasone furoate-vilanterol (BREO ELLIPTA) 200-25 MCG/INH AEPB Inhale 1 puff into the lungs daily.  . furosemide (LASIX) 40 MG tablet Take 40-80 mg by mouth daily. 80mg  on Monday and Tuesday and 40 mg all other days  . gabapentin (NEURONTIN) 300 MG capsule Take 300 mg by mouth 3 (three) times  daily. Reported on 10/14/2015  . lisinopril (PRINIVIL,ZESTRIL) 5 MG tablet Take 2.5 mg by mouth daily.  [name] LYRICA 75 MG capsule every morning.  . simvastatin (ZOCOR) 20 MG tablet Take 20 mg by mouth daily at 6 PM.   . spironolactone (ALDACTONE) 25 MG tablet Take 25 mg by mouth daily.     Allergies:   Oxycodone   Social History   Social History  . Marital status: Married    Spouse name: N/A  . Number of children: N/A  . Years of education: N/A   Social History Main Topics  . Smoking status: Former Smoker    Types: Cigarettes    Quit date: 11/14/2010  . Smokeless tobacco: Never Used  . Alcohol use No  . Drug use: No  . Sexual activity: Not Asked   Other Topics Concern  . None   Social History Narrative  . None     Family History: The patient's family history includes Heart disease in her mother; Hypertension in her mother.  ROS:   Please see the history of present illness.    All other systems reviewed and are negative.  EKGs/Labs/Other Studies Reviewed:    The following studies were reviewed today: I reviewed the hospital records extensively. Echocardiogram was and she has an ejection fraction of 25% with marked global hypokinesia and moderate mitral regurgitation JPS functional   Recent Labs: No results found for requested labs within last 8760 hours.  Recent Lipid Panel No results found for: CHOL, TRIG, HDL, CHOLHDL, VLDL, LDLCALC, LDLDIRECT  Physical Exam:    VS:  BP 112/68   Pulse 74   Resp 14   Ht 5\' 5"  (1.651 m)   Wt 185 lb 12.8 oz (84.3 kg)   BMI 30.92 kg/m     Wt Readings from Last 3 Encounters:  04/09/17 185 lb 12.8 oz (84.3 kg)  12/21/16 188 lb 11.2 oz (85.6 kg)  04/20/16 194 lb (88 kg)     GEN: Patient is in no acute distress HEENT: Normal NECK: No JVD; No carotid bruits LYMPHATICS: No lymphadenopathy CARDIAC: 2/6 systolic murmur at the apex.S1 and S2 heard RESPIRATORY:  Clear to auscultation without rales, wheezing or rhonchi  ABDOMEN:  Soft, non-tender, non-distended MUSCULOSKELETAL:  No edema; No deformity  SKIN: Warm and dry NEUROLOGIC:  Alert and oriented x 3 PSYCHIATRIC:  Normal affect   ASSESSMENT:    1. Acute on chronic combined systolic and diastolic CHF (congestive heart failure) ([place])   2. Nonischemic cardiomyopathy ([place])   3. Dyslipidemia   4. ICD (implantable cardioverter-defibrillator) in place   5. Essential hypertension    PLAN:    In order of problems listed above:  1. I discussed my findings with the patient extensively. Congestive heart failure was discussed extensively along with diet and salt restriction. She verbalized understanding. She  and her sister had multiple questions about hospitalization and echocardiogram report was also discussed with them at length.  2. Her blood pressure stable. I reviewed her records and medications at extensive length. She is very keen on getting established with electrophysiology service here. I would like her to be referred to our electrophysiology colleagues to see if there is any intervention that can be done for a defibrillator such that it will help her congestive heart failure. She will be seen in follow-up appointment in 3-4 weeks or earlier if she has any concerns. She will keep her Trak for weights and get in touch with [place] at any concerns. Total time for this evaluation was 45 minutes.   Medication Adjustments/Labs and Tests Ordered: Current medicines are reviewed at length with the patient today.  Concerns regarding medicines are outlined above.  No orders of the defined types were placed in this encounter.  No orders of the defined types were placed in this encounter.   Signed, Jenean Lindau, MD  04/09/2017 10:12 AM    [place]

## 2017-04-09 NOTE — Patient Instructions (Signed)
Medication Instructions:  Your physician recommends that you continue on your current medications as directed. Please refer to the Current Medication list given to you today.  Labwork: None    Testing/Procedures: none  Follow-Up: Your physician recommends that you schedule a follow-up appointment in: 3-4 weeks with Dr. Geraldo Pitter and he would like you to see Dr. Curt Bears to establish EP cardiologist.   Any Other Special Instructions Will Be Listed Below (If Applicable).     If you need a refill on your cardiac medications before your next appointment, please call your pharmacy.

## 2017-05-15 ENCOUNTER — Telehealth: Payer: Self-pay | Admitting: Cardiology

## 2017-05-15 NOTE — Telephone Encounter (Signed)
[facility] and called they want to know why patient was  Taken off Lisinopril.[name] Please call the RN at [facility] at the provided telephone number. You may leave message on VM

## 2017-05-16 ENCOUNTER — Ambulatory Visit (INDEPENDENT_AMBULATORY_CARE_PROVIDER_SITE_OTHER): Payer: Medicare Other | Admitting: Cardiology

## 2017-05-16 ENCOUNTER — Ambulatory Visit (HOSPITAL_BASED_OUTPATIENT_CLINIC_OR_DEPARTMENT_OTHER)
Admission: RE | Admit: 2017-05-16 | Discharge: 2017-05-16 | Disposition: A | Discharge status: Home / Self Care | Payer: Medicare Other | Source: Ambulatory Visit | Attending: Cardiology | Admitting: Cardiology

## 2017-05-16 ENCOUNTER — Encounter: Payer: Self-pay | Admitting: Cardiology

## 2017-05-16 VITALS — BP 116/74 | HR 63 | Ht 65.0 in | Wt 182.1 lb

## 2017-05-16 DIAGNOSIS — T82111A Breakdown (mechanical) of cardiac pulse generator (battery), initial encounter: Secondary | ICD-10-CM

## 2017-05-16 DIAGNOSIS — Z95810 Presence of automatic (implantable) cardiac defibrillator: Secondary | ICD-10-CM | POA: Diagnosis not present

## 2017-05-16 DIAGNOSIS — I10 Essential (primary) hypertension: Secondary | ICD-10-CM

## 2017-05-16 DIAGNOSIS — Z950 Presence of cardiac pacemaker: Secondary | ICD-10-CM | POA: Insufficient documentation

## 2017-05-16 DIAGNOSIS — I428 Other cardiomyopathies: Secondary | ICD-10-CM | POA: Diagnosis present

## 2017-05-16 DIAGNOSIS — J449 Chronic obstructive pulmonary disease, unspecified: Secondary | ICD-10-CM | POA: Diagnosis not present

## 2017-05-16 DIAGNOSIS — I517 Cardiomegaly: Secondary | ICD-10-CM | POA: Diagnosis not present

## 2017-05-16 DIAGNOSIS — I700 Atherosclerosis of aorta: Secondary | ICD-10-CM | POA: Insufficient documentation

## 2017-05-16 DIAGNOSIS — I5043 Acute on chronic combined systolic (congestive) and diastolic (congestive) heart failure: Secondary | ICD-10-CM

## 2017-05-16 LAB — CUP PACEART INCLINIC DEVICE CHECK
Battery Voltage: 3.13 V
Brady Statistic RA Percent Paced: 41 %
HIGH POWER IMPEDANCE MEASURED VALUE: 79 Ohm
Implantable Lead Implant Date: 20140812
Implantable Lead Implant Date: 20140812
Implantable Lead Location: 753859
Implantable Lead Model: 375
Implantable Lead Serial Number: 29451060
Implantable Pulse Generator Implant Date: 20140812
Lead Channel Impedance Value: 1475 Ohm
Lead Channel Impedance Value: 520 Ohm
Lead Channel Pacing Threshold Amplitude: 0.4 V
Lead Channel Pacing Threshold Amplitude: 2.6 V
Lead Channel Pacing Threshold Pulse Width: 0.4 ms
Lead Channel Pacing Threshold Pulse Width: 1 ms
Lead Channel Sensing Intrinsic Amplitude: 1.6 mV
Lead Channel Sensing Intrinsic Amplitude: 15.6 mV
Lead Channel Setting Pacing Amplitude: 2.5 V
Lead Channel Setting Pacing Amplitude: 3.5 V
Lead Channel Setting Pacing Pulse Width: 0.4 ms
Lead Channel Setting Sensing Sensitivity: 0.8 mV
MDC IDC LEAD LOCATION: 753860
MDC IDC LEAD SERIAL: 10491693
MDC IDC SESS DTM: 20180822182600
MDC IDC STAT BRADY RV PERCENT PACED: 12 %
Pulse Gen Model: 383562
Pulse Gen Serial Number: 60721404

## 2017-05-16 NOTE — Progress Notes (Signed)
Electrophysiology Office Note   Date:  05/16/2017   ID:  Lisa [name], DOB 1945/12/29, MRN [id number]  PCP:  Angelina Sheriff, MD  Cardiologist:  Revankar Primary Electrophysiologist:  Seung Nidiffer Meredith Leeds, MD    Chief Complaint  Patient presents with  . Defib Check    Nonischemic cardiomyopathy/chronic combined systolic and diastolic CHF      History of Present Illness: Lisa [name] is a 71 y.o. female who is being seen today for the evaluation of CHF, ICD at the request of Angelina Sheriff, MD. Presenting today for electrophysiology evaluation. She has a history of congestive heart failure, hypertension, COPD, diabetes, hypertension, hyperlipidemia. She has a Biotronik ICD in place.  Today, she denies symptoms of palpitations,  shortness of breath, orthopnea, PND, lower extremity edema, claudication, dizziness, presyncope, syncope, bleeding, or neurologic sequela. The patient is tolerating medications without difficulties. She does have chest pain. The pain is in the center of her chest. It lasts for days at a time. There is no relation with exertion. She has gone to the emergency room and had ischemia ruled out in the past.   Past Medical History:  Diagnosis Date  . AICD (automatic cardioverter/defibrillator) present    Biotroniks  . Arthritis    knees with arthritis as well as has back, pt. remarks that her hands fall asleep when she is in the bed or in certain positions   . Bell's palsy   . Carotid artery occlusion   . CHF (congestive heart failure) ([place])   . COPD (chronic obstructive pulmonary disease) ([place])   . Diabetes mellitus without complication (HCC)    borderline  . GERD (gastroesophageal reflux disease)    uses otc for occas. heartburn   . Hyperlipidemia   . Hypertension   . Neuropathy   . Non-ischemic cardiomyopathy ([place])   . Pneumonia    hosp. in [place]-2015  . Shortness of breath dyspnea    Past Surgical History:  Procedure  Laterality Date  . ABDOMINAL AORTAGRAM N/A 11/27/2014   Procedure: ABDOMINAL Maxcine Ham;  Surgeon: Elam Dutch, MD;  Location[facility] CATH LAB;  Service: Cardiovascular;  Laterality: N/A;  . ABDOMINAL HYSTERECTOMY    . APPENDECTOMY    . CARDIAC CATHETERIZATION     02/20/13: Normal coronaries, moderate LV dysfunction, EF 35%. Medical RX ([facility])  . CARDIAC DEFIBRILLATOR PLACEMENT  Aug. 2014   dual chamber Biotronik ICD 05/06/13 ([facility], Dr. Minna Merritts)  . ENDARTERECTOMY FEMORAL Right 12/21/2014   Procedure: ENDARTERECTOMY FEMORAL;  Surgeon: Elam Dutch, MD;  Location: [place];  Service: Vascular;  Laterality: Right;  . FEMORAL-FEMORAL BYPASS GRAFT Bilateral 12/21/2014   Procedure: BYPASS GRAFT LEFT FEMORAL-RIGHT FEMORAL ARTERY;  Surgeon: Elam Dutch, MD;  Location: [place];  Service: Vascular;  Laterality: Bilateral;  . JOINT REPLACEMENT Bilateral    implants  . TUBAL LIGATION       Current Outpatient Prescriptions  Medication Sig Dispense Refill  . acetaminophen (TYLENOL) 650 MG CR tablet Take 650 mg by mouth every 8 (eight) hours as needed for pain.    [name] albuterol (PROVENTIL HFA;VENTOLIN HFA) 108 (90 BASE) MCG/ACT inhaler Inhale 2 puffs into the lungs every 6 (six) hours as needed for wheezing or shortness of breath.    [name] aspirin 81 MG tablet Take 162 mg by mouth daily.     . carvedilol (COREG) 12.5 MG tablet Take 12.5 mg by mouth 2 (two) times daily with a meal.    . digoxin (  LANOXIN) 0.125 MG tablet Take 0.0625 mg by mouth 3 (three) times a week. Take 1/2 Tab Monday Wednesday and Friday    . fluticasone furoate-vilanterol (BREO ELLIPTA) 200-25 MCG/INH AEPB Inhale 1 puff into the lungs daily.    [name] LYRICA 75 MG capsule every morning.    . OXcarbazepine (TRILEPTAL) 150 MG tablet Take 150 mg by mouth 2 (two) times daily.    [name] torsemide (DEMADEX) 20 MG tablet Take 20 mg by mouth 2 (two) times daily.     No current facility-administered medications for this visit.     Allergies:   Oxycodone    Social History:  The patient  reports that she quit smoking about 6 years ago. Her smoking use included Cigarettes. She has never used smokeless tobacco. She reports that she does not drink alcohol or use drugs.   Family History:  The patient's family history includes Heart disease in her mother; Hypertension in her mother.    ROS:  Please see the history of present illness.   Otherwise, review of systems is positive for chest pain.   All other systems are reviewed and negative.    PHYSICAL EXAM: VS:  BP 116/74   Pulse 63   Ht 5\' 5"  (1.651 m)   Wt 182 lb 1.9 oz (82.6 kg)   BMI 30.31 kg/m  , BMI Body mass index is 30.31 kg/m. GEN: Well nourished, well developed, in no acute distress  HEENT: normal  Neck: no JVD, carotid bruits, or masses Cardiac: RRR; no murmurs, rubs, or gallops,no edema  Respiratory:  clear to auscultation bilaterally, normal work of breathing GI: soft, nontender, nondistended, + BS MS: no deformity or atrophy  Skin: warm and dry,  device pocket is well healed Neuro:  Strength and sensation are intact Psych: euthymic mood, full affect  EKG:  EKG is ordered today. Personal review of the ekg ordered shows atrial paced, rate 63, first-degree AV block, PVCs, old septal infarct   Device interrogation is reviewed today in detail.  See PaceArt for details.   Recent Labs: No results found for requested labs within last 8760 hours.    Lipid Panel  No results found for: CHOL, TRIG, HDL, CHOLHDL, VLDL, LDLCALC, LDLDIRECT   Wt Readings from Last 3 Encounters:  05/16/17 182 lb 1.9 oz (82.6 kg)  04/09/17 185 lb 12.8 oz (84.3 kg)  12/21/16 188 lb 11.2 oz (85.6 kg)      Other studies Reviewed: Additional studies/ records that were reviewed today include: TTE 04/02/17  Review of the above records today demonstrates:  Severely reduced LV systolic function. Ejection fraction is visually estimated at 25% Global LV hypokinesis. Abnormal LV diastolic function with  evidence of elevated LA pressure. Mildly dilated RV chamber. RV systolic function is probably normal. Biatrial dilation, mild. Moderate mitral regurgitation, probably functional. Mild tricuspid regurgitation.   ASSESSMENT AND PLAN:  1.  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 Unfortunately, her atrial threshold is gone up and it appears that her lead is not functioning appropriately. She Aolani Piggott likely need lead revision. In the interim, Jericho Cieslik plan for a chest x-ray to determine if the lead has dislodged.  2. Hypertension: Currently well controlled  3. Chest pain: Symptoms appear to be quite atypical in nature. She did have a heart catheterization less than one year ago that showed nonobstructive coronary disease. This is likely noncardiac in nature.    Current medicines are reviewed at length with the  patient today.   The patient does not have concerns regarding her medicines.  The following changes were made today:  none  Labs/ tests ordered today include:  Orders Placed This Encounter  Procedures  . DG Chest 2 View  . EKG 12-Lead     Disposition:   FU with Kinlie Janice 3 months  Signed, Sayde Lish Meredith Leeds, MD  05/16/2017 4:07 PM     [place] [address] [place] [address] [phone] (office) [phone] (fax)

## 2017-05-16 NOTE — Telephone Encounter (Signed)
Please discuss with RRR at todays appointment.

## 2017-05-16 NOTE — Patient Instructions (Signed)
Medication Instructions:  Your physician recommends that you continue on your current medications as directed. Please refer to the Current Medication list given to you today.  If you need a refill on your cardiac medications before your next appointment, please call your pharmacy.   Labwork: None ordered  Testing/Procedures: A chest x-ray takes a picture of the organs and structures inside the chest, including the heart, lungs, and blood vessels. This test can show several things, including, whether the heart is enlarges; whether fluid is building up in the lungs; and whether pacemaker / defibrillator leads are still in place.  Follow-Up: To be determined. Nurse will call you  Thank you for choosing [facility]!!   Trinidad Curet, RN [phone]

## 2017-05-17 NOTE — Telephone Encounter (Signed)
Please advise 

## 2017-05-31 ENCOUNTER — Telehealth: Payer: Self-pay

## 2017-05-31 NOTE — Telephone Encounter (Signed)
Nurse from [place] called stating pt that pt has gained 4.4lbs since 9/2 and have some SOB at bedtime. Also there is some swelling but goes down at night. Pt was 180lbs 9/2 and today 184.4lbs. I informed the nurse I will make Dr. Geraldo Pitter aware of this.

## 2017-05-31 NOTE — Telephone Encounter (Signed)
Nurse from [place] called stating pt that pt has gained 4.4lbs since 9/2 and have some SOB at bedtime. Also there is some swelling but goes down at night. Pt was 180lbs 9/2 and today 184.4lbs. I informed the nurse I will make Dr. Geraldo Pitter aware of this.

## 2017-06-04 ENCOUNTER — Encounter: Payer: Self-pay | Admitting: Cardiology

## 2017-06-04 NOTE — Progress Notes (Signed)
  Cardiology Office Note:    Date:  06/04/2017   ID:  MEKENNA FINAU, DOB 31-Jul-1946, MRN [id number]  PCP:  Angelina Sheriff, MD  Cardiologist:  Jenean Lindau, MD     Signed, Jenean Lindau, MD  06/04/2017 1:43 PM    [place]

## 2017-06-08 ENCOUNTER — Telehealth: Payer: Self-pay | Admitting: Cardiology

## 2017-06-08 NOTE — Telephone Encounter (Signed)
Patient has not been taking her BP Spiralactone and Lisinopril and wants to know if she needs to start back taking these meds.. Please call patient.

## 2017-06-08 NOTE — Telephone Encounter (Signed)
Patient states that Dr. Lin Landsman stopped lisinopril and spironolactone last summer. Patient states that she is seeing a new PCP now. Patient confirmed that she was not taking these medications when she seen Dr. Geraldo Pitter 03/2017. Advised patient not to begin these medications now as Dr. Geraldo Pitter reviewed the medication list at that visit and did not make any changes at that time. Patient verbalized understanding.

## 2017-06-22 ENCOUNTER — Other Ambulatory Visit: Payer: Self-pay

## 2017-06-22 ENCOUNTER — Telehealth: Payer: Self-pay | Admitting: Cardiology

## 2017-06-22 MED ORDER — TORSEMIDE 20 MG PO TABS: 20.0000 mg | ORAL_TABLET | Freq: Two times a day (BID) | ORAL | 2 refills | Status: DC

## 2017-06-22 NOTE — Telephone Encounter (Signed)
°*  STAT* If patient is at the pharmacy, call can be transferred to refill team.   1. Which medications need to be refilled? (please list name of each medication and dose if known) [place]  2. Which pharmacy/location (including street and city if local pharmacy) is medication to be sent to?[facility]  3. Do they need a 30 day or 90 day supply? #30  Please sendin refills she is Completely out!!

## 2017-06-22 NOTE — Telephone Encounter (Signed)
Medication sent to pharmacy  

## 2017-06-28 ENCOUNTER — Ambulatory Visit (HOSPITAL_COMMUNITY)
Admission: RE | Admit: 2017-06-28 | Discharge: 2017-06-28 | Disposition: A | Discharge status: Home / Self Care | Payer: Medicare Other | Source: Ambulatory Visit | Attending: Vascular Surgery | Admitting: Vascular Surgery

## 2017-06-28 ENCOUNTER — Ambulatory Visit (INDEPENDENT_AMBULATORY_CARE_PROVIDER_SITE_OTHER): Payer: Medicare Other | Admitting: Family

## 2017-06-28 ENCOUNTER — Encounter: Payer: Self-pay | Admitting: Family

## 2017-06-28 VITALS — BP 126/82 | HR 63 | Temp 96.9°F | Resp 20 | Ht 65.0 in | Wt 184.0 lb

## 2017-06-28 DIAGNOSIS — I6523 Occlusion and stenosis of bilateral carotid arteries: Secondary | ICD-10-CM

## 2017-06-28 DIAGNOSIS — I779 Disorder of arteries and arterioles, unspecified: Secondary | ICD-10-CM

## 2017-06-28 DIAGNOSIS — Z87891 Personal history of nicotine dependence: Secondary | ICD-10-CM | POA: Diagnosis not present

## 2017-06-28 LAB — VAS US CAROTID
LCCADSYS: 71 cm/s
LEFT ECA DIAS: -22 cm/s
Left CCA dist dias: 23 cm/s
Left CCA prox dias: 23 cm/s
Left CCA prox sys: 76 cm/s
Left ICA dist dias: -52 cm/s
Left ICA dist sys: -112 cm/s
Left ICA prox dias: -71 cm/s
Left ICA prox sys: -221 cm/s
RCCAPDIAS: 26 cm/s
RCCAPSYS: 97 cm/s
RIGHT CCA MID DIAS: 26 cm/s
RIGHT ECA DIAS: -17 cm/s
Right cca dist sys: -61 cm/s

## 2017-06-28 NOTE — Progress Notes (Signed)
[facility] [place] HISTORY AND PHYSICAL   CC: Follow up for extracranial carotid artery stenosis and peripheral artery disease   History of Present Illness:   Lisa [name] is a 71 y.o. female who is s/p left to right femoral-femoral bypass, right femoral endarterectomy on 12/21/14 by Dr. Oneida Alar. She also has a known history of bilateral carotid stenosis.   She returns today for routine follow up.   She has a history of bilateral total knee replacements. Dr. Geraldo Pitter is her cardiologist in [facility]. Her PCP is Dr. [name].   She rides a bike and exercises daily since 2013.   Pt she states she has neuropathy in her feet and legs, and has arthritis pain. Otherwise she denies any worse pain in her legs with walking.  She states she cannot walk much due to shortness of breath.   The patient denies any history of TIA or stroke symptoms, specifically the patient denies a history of amaurosis fugax or monocular blindness, denies a history unilateral  of facial drooping, denies a history of hemiplegia, and denies a history of receptive or expressive aphasia.    Pt Diabetic: Yes, pt states "it was good" but she does not know what it was Pt smoker: former smoker, quit in 2012, started about age 18 years  Pt meds include: Statin :No Betablocker: Yes ASA: Yes Other anticoagulants/antiplatelets: Plavix  Current Outpatient Prescriptions  Medication Sig Dispense Refill  . acetaminophen (TYLENOL) 650 MG CR tablet Take 650 mg by mouth every 8 (eight) hours as needed for pain.    [name] albuterol (PROVENTIL HFA;VENTOLIN HFA) 108 (90 BASE) MCG/ACT inhaler Inhale 2 puffs into the lungs every 6 (six) hours as needed for wheezing or shortness of breath.    [name] aspirin 81 MG tablet Take 162 mg by mouth daily.     . carvedilol (COREG) 12.5 MG tablet Take 12.5 mg by mouth 2 (two) times daily with a meal.    . digoxin (LANOXIN) 0.125 MG tablet Take 0.0625 mg by mouth 3 (three)  times a week. Take 1/2 Tab Monday Wednesday and Friday    . fluticasone furoate-vilanterol (BREO ELLIPTA) 200-25 MCG/INH AEPB Inhale 1 puff into the lungs daily.    [name] LYRICA 75 MG capsule every morning.    . OXcarbazepine (TRILEPTAL) 150 MG tablet Take 150 mg by mouth 2 (two) times daily.    [name] torsemide (DEMADEX) 20 MG tablet Take 1 tablet (20 mg total) by mouth 2 (two) times daily. 60 tablet 2   No current facility-administered medications for this visit.     Past Medical History:  Diagnosis Date  . AICD (automatic cardioverter/defibrillator) present    Biotroniks  . Arthritis    knees with arthritis as well as has back, pt. remarks that her hands fall asleep when she is in the bed or in certain positions   . Bell's palsy   . Carotid artery occlusion   . CHF (congestive heart failure) ([place])   . COPD (chronic obstructive pulmonary disease) ([place])   . Diabetes mellitus without complication (HCC)    borderline  . GERD (gastroesophageal reflux disease)    uses otc for occas. heartburn   . Hyperlipidemia   . Hypertension   . Neuropathy   . Non-ischemic cardiomyopathy ([place])   . Pneumonia    hosp. in [place]-2015  . Shortness of breath dyspnea     Social History Social History  Substance Use Topics  . Smoking status: Former Smoker  Types: Cigarettes    Quit date: 11/14/2010  . Smokeless tobacco: Never Used  . Alcohol use No    Family History Family History  Problem Relation Age of Onset  . Heart disease Mother   . Hypertension Mother     Surgical History Past Surgical History:  Procedure Laterality Date  . ABDOMINAL AORTAGRAM N/A 11/27/2014   Procedure: ABDOMINAL Maxcine Ham;  Surgeon: Elam Dutch, MD;  Location[facility] C[facility];  Service: Cardiovascular;  Laterality: N/A;  . ABDOMINAL HYSTERECTOMY    . APPENDECTOMY    . CARDIAC CATHETERIZATION     02/20/13: Normal coronaries, moderate LV dysfunction, EF 35%. Medical RX ([facility])  . CARDIAC DEFIBRILLATOR PLACEMENT  Aug.  2014   dual chamber Biotronik ICD 05/06/13 ([facility], Dr. Minna Merritts)  . ENDARTERECTOMY FEMORAL Right 12/21/2014   Procedure: ENDARTERECTOMY FEMORAL;  Surgeon: Elam Dutch, MD;  Location: [facility];  Service: Vascular;  Laterality: Right;  . FEMORAL-FEMORAL BYPASS GRAFT Bilateral 12/21/2014   Procedure: BYPASS GRAFT LEFT FEMORAL-RIGHT FEMORAL ARTERY;  Surgeon: Elam Dutch, MD;  Location: [facility];  Service: Vascular;  Laterality: Bilateral;  . JOINT REPLACEMENT Bilateral    implants  . TUBAL LIGATION      Allergies  Allergen Reactions  . Oxycodone Shortness Of Breath    Current Outpatient Prescriptions  Medication Sig Dispense Refill  . acetaminophen (TYLENOL) 650 MG CR tablet Take 650 mg by mouth every 8 (eight) hours as needed for pain.    [name] albuterol (PROVENTIL HFA;VENTOLIN HFA) 108 (90 BASE) MCG/ACT inhaler Inhale 2 puffs into the lungs every 6 (six) hours as needed for wheezing or shortness of breath.    [name] aspirin 81 MG tablet Take 162 mg by mouth daily.     . carvedilol (COREG) 12.5 MG tablet Take 12.5 mg by mouth 2 (two) times daily with a meal.    . digoxin (LANOXIN) 0.125 MG tablet Take 0.0625 mg by mouth 3 (three) times a week. Take 1/2 Tab Monday Wednesday and Friday    . fluticasone furoate-vilanterol (BREO ELLIPTA) 200-25 MCG/INH AEPB Inhale 1 puff into the lungs daily.    [name] LYRICA 75 MG capsule every morning.    . OXcarbazepine (TRILEPTAL) 150 MG tablet Take 150 mg by mouth 2 (two) times daily.    [name] torsemide (DEMADEX) 20 MG tablet Take 1 tablet (20 mg total) by mouth 2 (two) times daily. 60 tablet 2   No current facility-administered medications for this visit.      REVIEW OF SYSTEMS: See HPI for pertinent positives and negatives.  Physical Examination Vitals:   06/28/17 0956  BP: 126/82  Pulse: 63  Resp: 20  Temp: (!) 96.9 F (36.1 C)  TempSrc: Oral  SpO2: 94%  Weight: 184 lb (83.5 kg)  Height: 5\' 5"  (1.651 m)   Body mass index is 30.62 kg/m.  General:  WDWN  obese female in NAD Gait: Normal HENT: Grossly normal.  Eyes: PERRLA Pulmonary: normal non-labored breathing , without Rales, rhonchi, wheezing Cardiac: RRR, no murmurs detected  Abdomen: soft, NT, no palpable masses Skin: no rashes, no ulcers no gangrene, no cellulitis.   VASCULAR EXAM  Carotid Bruits Left Right   Negative Positive     VASCULAR EXAM: Extremities without ischemic changes  without Gangrene; without open wounds. Fem-fem bypass pulse is palpable.       LE Pulses LEFT RIGHT   FEMORAL 1+palpable, obese abdomen 1+ palpable, obese abdomen    POPLITEAL not palpable  not palpable  POSTERIOR TIBIAL not palpable  not palpable    DORSALIS PEDIS  ANTERIOR TIBIAL 2+ palpable  2+ palpable     Musculoskeletal: no muscle wasting or atrophy; no edema Neurologic: A&O X 3; appropriate affect; SENSATION: normal; MOTOR FUNCTION: 5/5 Symmetric, CN 2-12 intact, speech is fluent/normal.    ASSESSMENT:  Lisa [name] is a 71 y.o. female who is s/p left to right femoral-femoral bypass, right femoral endarterectomy on 12/21/14. Bilateral DP pulses are 2+ palpable. Fem-fem bypass graft pulse is palpable.  She has no hx of stroke or TIA. She does not seem to walk enough to elicit claudication sx's as her walking is limited by dyspnea; nevertheless, she exercises daily on a stationary bike and other exercises.   Pt is not on a statin, her history does not document any adverse reaction to statins, and her documented hx indicates hyperlipidemia.  Will defer to her cardiologist or PCP re statin use.   DATA  Carotid Duplex (06/28/17); Bilateral ICA with 60-79% stenosis. Retrograde left vertebral artery with monophasic subclavian.  Right vertebral  artery flow is antegrade, right subclavian waveforms are multiphasic. Right ICA stenosis has progressed compared to the previous study on 12-21-16.   ABI's on 12-21-16 showed mild bilateral arterial occlusive disease with bi and triphasic waveforms.    PLAN:   Based on today's exam and non-invasive vascular lab results, the patient will follow up in 6 months with the following tests: carotid duplex and ABI's. I discussed in depth with the patient the nature of atherosclerosis, and emphasized the importance of maximal medical management including strict control of blood pressure, blood glucose, and lipid levels, obtaining regular exercise, and cessation of smoking.  The patient is aware that without maximal medical management the und  The patient was given information about stroke prevention and what symptoms should prompt the patient to seek immediate medical care.  The patient was given information about PAD including signs, symptoms, treatment, what symptoms should prompt the patient to seek immediate medical care, and risk reduction measures to take.  Thank you for allowing [place] to participate in this patient's care.  Clemon Chambers, RN, MSN, FNP-C [facility] Office: [phone]  Clinic MD: Oneida Alar 06/28/2017 7:28 PM

## 2017-06-28 NOTE — Patient Instructions (Signed)
Stroke Prevention Some medical conditions and behaviors are associated with an increased chance of having a stroke. You may prevent a stroke by making healthy choices and managing medical conditions. How can I reduce my risk of having a stroke?  Stay physically active. Get at least 30 minutes of activity on most or all days.  Do not smoke. It may also be helpful to avoid exposure to secondhand smoke.  Limit alcohol use. Moderate alcohol use is considered to be:  No more than 2 drinks per day for men.  No more than 1 drink per day for nonpregnant women.  Eat healthy foods. This involves:  Eating 5 or more servings of fruits and vegetables a day.  Making dietary changes that address high blood pressure (hypertension), high cholesterol, diabetes, or obesity.  Manage your cholesterol levels.  Making food choices that are high in fiber and low in saturated fat, trans fat, and cholesterol may control cholesterol levels.  Take any prescribed medicines to control cholesterol as directed by your health care provider.  Manage your diabetes.  Controlling your carbohydrate and sugar intake is recommended to manage diabetes.  Take any prescribed medicines to control diabetes as directed by your health care provider.  Control your hypertension.  Making food choices that are low in salt (sodium), saturated fat, trans fat, and cholesterol is recommended to manage hypertension.  Ask your health care provider if you need treatment to lower your blood pressure. Take any prescribed medicines to control hypertension as directed by your health care provider.  If you are 18-39 years of age, have your blood pressure checked every 3-5 years. If you are 40 years of age or older, have your blood pressure checked every year.  Maintain a healthy weight.  Reducing calorie intake and making food choices that are low in sodium, saturated fat, trans fat, and cholesterol are recommended to manage  weight.  Stop drug abuse.  Avoid taking birth control pills.  Talk to your health care provider about the risks of taking birth control pills if you are over 35 years old, smoke, get migraines, or have ever had a blood clot.  Get evaluated for sleep disorders (sleep apnea).  Talk to your health care provider about getting a sleep evaluation if you snore a lot or have excessive sleepiness.  Take medicines only as directed by your health care provider.  For some people, aspirin or blood thinners (anticoagulants) are helpful in reducing the risk of forming abnormal blood clots that can lead to stroke. If you have the irregular heart rhythm of atrial fibrillation, you should be on a blood thinner unless there is a good reason you cannot take them.  Understand all your medicine instructions.  Make sure that other conditions (such as anemia or atherosclerosis) are addressed. Get help right away if:  You have sudden weakness or numbness of the face, arm, or leg, especially on one side of the body.  Your face or eyelid droops to one side.  You have sudden confusion.  You have trouble speaking (aphasia) or understanding.  You have sudden trouble seeing in one or both eyes.  You have sudden trouble walking.  You have dizziness.  You have a loss of balance or coordination.  You have a sudden, severe headache with no known cause.  You have new chest pain or an irregular heartbeat. Any of these symptoms may represent a serious problem that is an emergency. Do not wait to see if the symptoms will go away.   Get medical help at once. Call your local emergency services (911 in [place] Do not drive yourself to the hospital.  This information is not intended to replace advice given to you by your health care provider. Make sure you discuss any questions you have with your health care provider. Document Released: 10/19/2004 Document Revised: 02/17/2016 Document Reviewed: 03/14/2013 Elsevier  Interactive Patient Education  2017 Elsevier Inc.      Peripheral Vascular Disease Peripheral vascular disease (PVD) is a disease of the blood vessels that are not part of your heart and brain. A simple term for PVD is poor circulation. In most cases, PVD narrows the blood vessels that carry blood from your heart to the rest of your body. This can result in a decreased supply of blood to your arms, legs, and internal organs, like your stomach or kidneys. However, it most often affects a person's lower legs and feet. There are two types of PVD.  Organic PVD. This is the more common type. It is caused by damage to the structure of blood vessels.  Functional PVD. This is caused by conditions that make blood vessels contract and tighten (spasm). Without treatment, PVD tends to get worse over time. PVD can also lead to acute ischemic limb. This is when an arm or limb suddenly has trouble getting enough blood. This is a medical emergency. Follow these instructions at home:  Take medicines only as told by your doctor.  Do not use any tobacco products, including cigarettes, chewing tobacco, or electronic cigarettes. If you need help quitting, ask your doctor.  Lose weight if you are overweight, and maintain a healthy weight as told by your doctor.  Eat a diet that is low in fat and cholesterol. If you need help, ask your doctor.  Exercise regularly. Ask your doctor for some good activities for you.  Take good care of your feet.  Wear comfortable shoes that fit well.  Check your feet often for any cuts or sores. Contact a doctor if:  You have cramps in your legs while walking.  You have leg pain when you are at rest.  You have coldness in a leg or foot.  Your skin changes.  You are unable to get or have an erection (erectile dysfunction).  You have cuts or sores on your feet that are not healing. Get help right away if:  Your arm or leg turns cold and blue.  Your arms or legs  become red, warm, swollen, painful, or numb.  You have chest pain or trouble breathing.  You suddenly have weakness in your face, arm, or leg.  You become very confused or you cannot speak.  You suddenly have a very bad headache.  You suddenly cannot see. This information is not intended to replace advice given to you by your health care provider. Make sure you discuss any questions you have with your health care provider. Document Released: 12/06/2009 Document Revised: 02/17/2016 Document Reviewed: 02/19/2014 Elsevier Interactive Patient Education  2017 Elsevier Inc.  

## 2017-06-29 NOTE — Addendum Note (Signed)
Addended by: Lianne Cure A on: 06/29/2017 04:22 PM   Modules accepted: Orders

## 2017-07-09 ENCOUNTER — Encounter: Payer: Self-pay | Admitting: Cardiology

## 2017-07-09 ENCOUNTER — Ambulatory Visit (INDEPENDENT_AMBULATORY_CARE_PROVIDER_SITE_OTHER): Payer: Medicare Other | Admitting: Cardiology

## 2017-07-09 VITALS — BP 106/60 | HR 60 | Ht 65.0 in | Wt 178.8 lb

## 2017-07-09 DIAGNOSIS — E785 Hyperlipidemia, unspecified: Secondary | ICD-10-CM

## 2017-07-09 DIAGNOSIS — I428 Other cardiomyopathies: Secondary | ICD-10-CM

## 2017-07-09 DIAGNOSIS — Z95810 Presence of automatic (implantable) cardiac defibrillator: Secondary | ICD-10-CM

## 2017-07-09 DIAGNOSIS — I5043 Acute on chronic combined systolic (congestive) and diastolic (congestive) heart failure: Secondary | ICD-10-CM

## 2017-07-09 DIAGNOSIS — I10 Essential (primary) hypertension: Secondary | ICD-10-CM

## 2017-07-09 MED ORDER — METOLAZONE 2.5 MG PO TABS: 2.5000 mg | ORAL_TABLET | Freq: Every day | ORAL | 3 refills | Status: DC

## 2017-07-09 NOTE — Patient Instructions (Addendum)
Medication Instructions:  Your physician has recommended you make the following change in your medication:  START Metolazone 2.5mg  daily STOP Torsemide  Labwork: Your physician recommends that you have a BMP done today  Testing/Procedures: None  Follow-Up: 2 weeks  Any Other Special Instructions Will Be Listed Below (If Applicable).     If you need a refill on your cardiac medications before your next appointment, please call your pharmacy.  Metolazone tablets What is this medicine? METOLAZONE (me TOLE a zone) is a diuretic. It increases the amount of urine passed, which causes the body to lose salt and water. This medicine is used to treat high blood pressure. It is also reduces the swelling and water retention caused by heart or kidney disease. This medicine may be used for other purposes; ask your health care provider or pharmacist if you have questions. COMMON BRAND NAME(S): Mykrox, Zaroxolyn What should I tell my health care provider before I take this medicine? They need to know if you have any of these conditions: -diabetes -gout -immune system problems, like lupus -kidney disease -liver disease -pancreatitis -small amount of urine or difficulty passing urine -an unusual or allergic reaction to metolazone, sulfa drugs, other medicines, foods, dyes, or preservatives -pregnant or trying to get pregnant -breast-feeding How should I use this medicine? Take this medicine by mouth with a glass of water. Follow the directions on the prescription label. Remember that you will need to pass urine frequently after taking this medicine. Do not take your doses at a time of day that will cause you problems. Do not take at bedtime. Take your medicine at regular intervals. Do not take your medicine more often than directed. Do not stop taking except on your doctor's advice. Talk to your pediatrician regarding the use of this medicine in children. Special care may be  needed. Overdosage: If you think you have taken too much of this medicine contact a poison control center or emergency room at once. NOTE: This medicine is only for you. Do not share this medicine with others. What if I miss a dose? If you miss a dose, take it as soon as you can. If it is almost time for your next dose, take only that dose. Do not take double or extra doses. What may interact with this medicine? -alcohol -antiinflammatory drugs for pain or swelling -barbiturates for sleep or seizure control -digoxin -dofetilide -lithium -medicines for blood sugar -medicines for high blood pressure -medicines that relax muscles for surgery -methenamine -other diuretics -some medicines for pain -steroid hormones like cortisone, hydrocortisone, and prednisone -warfarin This list may not describe all possible interactions. Give your health care provider a list of all the medicines, herbs, non-prescription drugs, or dietary supplements you use. Also tell them if you smoke, drink alcohol, or use illegal drugs. Some items may interact with your medicine. What should I watch for while using this medicine? Visit your doctor or health care professional for regular checks on your progress. Check your blood pressure as directed. Ask your doctor or health care professional what your blood pressure should be and when you should contact him or her. You may need to be on a special diet while taking this medicine. Ask your doctor. Check with your doctor or health care professional if you get an attack of severe diarrhea, nausea and vomiting, or if you sweat a lot. The loss of too much body fluid can make it dangerous for you to take this medicine. You may get drowsy or  dizzy. Do not drive, use machinery, or do anything that needs mental alertness until you know how this medicine affects you. Do not stand or sit up quickly, especially if you are an older patient. This reduces the risk of dizzy or fainting  spells. Alcohol may interfere with the effect of this medicine. Avoid alcoholic drinks. This medicine may affect your blood sugar level. If you have diabetes, check with your doctor or health care professional before changing the dose of your diabetic medicine. This medicine can make you more sensitive to the sun. Keep out of the sun. If you cannot avoid being in the sun, wear protective clothing and use sunscreen. Do not use sun lamps or tanning beds/booths. What side effects may I notice from receiving this medicine? Side effects that you should report to your doctor or health care professional as soon as possible: -allergic reactions such as skin rash or itching, hives, swelling of the lips, mouth, tongue, or throat -fast or irregular heartbeat, chest pain -feeling faint -fever, chills -gout pain -hot red lump on leg -muscle pain, cramps -nausea, vomiting -numbness or tingling in hands, feet -pain or difficulty when passing urine -redness, blistering, peeling or loosening of the skin, including inside the mouth -unusual bleeding or bruising -unusually weak or tired -yellowing of the eyes, skin Side effects that usually do not require medical attention (report to your doctor or health care professional if they continue or are bothersome): -abdominal pain -blurred vision -constipation or diarrhea -dry mouth -headache This list may not describe all possible side effects. Call your doctor for medical advice about side effects. You may report side effects to FDA at 1-800-FDA-1088. Where should I keep my medicine? Keep out of the reach of children. Store at room temperature between 15 and 30 degrees C (59 and 86 degrees F). Protect from light. Keep container tightly closed. Throw away any unused medicine after the expiration date. NOTE: This sheet is a summary. It may not cover all possible information. If you have questions about this medicine, talk to your doctor, pharmacist, or health  care provider.  2018 Elsevier/Gold Standard (2008-03-30 14:11:48)

## 2017-07-09 NOTE — Progress Notes (Signed)
Cardiology Office Note:    Date:  07/09/2017   ID:  Lisa [name], DOB [birth year], MRN [id number]  PCP:  [name], MD  Cardiologist:  Jenean Lindau, MD   Referring MD: No ref. provider found    ASSESSMENT:    1. Acute on chronic combined systolic and diastolic CHF (congestive heart failure) ([place])   2. Essential hypertension   3. Nonischemic cardiomyopathy ([place])    4. Dyslipidemia   5. ICD (implantable cardioverter-defibrillator) in place    PLAN:    In order of problems listed above:  1. ongestive heart failure education was given to the patient. The patient mentions to me that she was not advised about medications to be taken at the time of discharge. I'm not sure whether this is correct information. At any rate I reviewed the patient's medications. She feels a little orthostatic, as far as her symptoms are concerned.She has been advised to stop torsemide. She will have a Chem-7 today. She will start metolazone 2.5 mg beginning tomorrow. 2. She will be seen in follow-up appointment in 2 weeks or earlier if she has any concerns. She knows to go to the nearest emergency room for any significant concerns. 3. Diet and weighing herself regularly and salt intake issues were discussed with her at length.   Medication Adjustments/Labs and Tests Ordered: Current medicines are reviewed at length with the patient today.  Concerns regarding medicines are outlined above.  Orders Placed This Encounter  Procedures  . Basic Metabolic Panel (BMET)   Meds ordered this encounter  Medications  . metolazone (ZAROXOLYN) 2.5 MG tablet    Sig: Take 1 tablet (2.5 mg total) by mouth daily.    Dispense:  90 tablet    Refill:  3     Chief Complaint  Patient presents with  . Hospitalization Follow-up    [place]   . Shortness of Breath  . Dizziness  . Congestive Heart Failure     History of Present Illness:    Lisa [name] is a 71 y.o. female she has past medical  history of advanced cardiomyopathy. She has a defibrillator.This is nonischemic cardiomyopathy. She has history of essential hypertension and dyslipidemia. At the time of my evaluation she is alert awake oriented and in no distress and her sister has brought her to the clinic. Patient mentions to me that she started gaining weight and shortness of breath and landed in the emergency [facility]. I reviewed the records extensively. I'm not sure about her compliance with diet and medications. She lives by herself and she is a poor historian.  Past Medical History:  Diagnosis Date  . AICD (automatic cardioverter/defibrillator) present    Biotroniks  . Arthritis    knees with arthritis as well as has back, pt. remarks that her hands fall asleep when she is in the bed or in certain positions   . Bell's palsy   . Carotid artery occlusion   . CHF (congestive heart failure) ([place])   . COPD (chronic obstructive pulmonary disease) ([place])   . Diabetes mellitus without complication (HCC)    borderline  . GERD (gastroesophageal reflux disease)    uses otc for occas. heartburn   . Hyperlipidemia   . Hypertension   . Neuropathy   . Non-ischemic cardiomyopathy ([place])   . Pneumonia    hosp. in [place]-2015  . Shortness of breath dyspnea     Past Surgical History:  Procedure Laterality Date  . ABDOMINAL AORTAGRAM  N/A 11/27/2014   Procedure: ABDOMINAL Maxcine Ham;  Surgeon: Elam Dutch, MD;  Location: [facility] CATH LAB;  Service: Cardiovascular;  Laterality: N/A;  . ABDOMINAL HYSTERECTOMY    . APPENDECTOMY    . CARDIAC CATHETERIZATION     02/20/13: Normal coronaries, moderate LV dysfunction, EF 35%. Medical RX ([facility])  . CARDIAC DEFIBRILLATOR PLACEMENT  Aug. 2014   dual chamber Biotronik ICD 05/06/13 ([facility], Dr. Minna Merritts)  . ENDARTERECTOMY FEMORAL Right 12/21/2014   Procedure: ENDARTERECTOMY FEMORAL;  Surgeon: Elam Dutch, MD;  Location: [place];  Service: Vascular;  Laterality: Right;  .  FEMORAL-FEMORAL BYPASS GRAFT Bilateral 12/21/2014   Procedure: BYPASS GRAFT LEFT FEMORAL-RIGHT FEMORAL ARTERY;  Surgeon: Elam Dutch, MD;  Location: [place];  Service: Vascular;  Laterality: Bilateral;  . JOINT REPLACEMENT Bilateral    implants  . TUBAL LIGATION      Current Medications: Current Meds  Medication Sig  . acetaminophen (TYLENOL) 650 MG CR tablet Take 650 mg by mouth every 8 (eight) hours as needed for pain.  [name] albuterol (PROVENTIL HFA;VENTOLIN HFA) 108 (90 BASE) MCG/ACT inhaler Inhale 2 puffs into the lungs every 6 (six) hours as needed for wheezing or shortness of breath.  [name] aspirin EC 81 MG tablet Take 162 mg by mouth daily.  . carvedilol (COREG) 12.5 MG tablet Take 12.5 mg by mouth 2 (two) times daily with a meal.  . digoxin (LANOXIN) 0.125 MG tablet Take 0.0625 mg by mouth 3 (three) times a week. Take 1/2 Tab Monday Wednesday and Friday  . fluticasone furoate-vilanterol (BREO ELLIPTA) 200-25 MCG/INH AEPB Inhale 1 puff into the lungs daily.  [name] LYRICA 75 MG capsule every morning.  . OXcarbazepine (TRILEPTAL) 150 MG tablet Take 150 mg by mouth 2 (two) times daily.  . [DISCONTINUED] aspirin 81 MG tablet Take 162 mg by mouth daily.   . [DISCONTINUED] torsemide (DEMADEX) 20 MG tablet Take 1 tablet (20 mg total) by mouth 2 (two) times daily.     Allergies:   Oxycodone   Social History   Social History  . Marital status: Married    Spouse name: N/A  . Number of children: N/A  . Years of education: N/A   Social History Main Topics  . Smoking status: Former Smoker    Types: Cigarettes    Quit date: 11/14/2010  . Smokeless tobacco: Never Used  . Alcohol use No  . Drug use: No  . Sexual activity: Not Asked   Other Topics Concern  . None   Social History Narrative  . None     Family History: The patient's family history includes Heart disease in her mother; Hypertension in her mother.  ROS:   Please see the history of present illness.    All other systems  reviewed and are negative.  EKGs/Labs/Other Studies Reviewed:    The following studies were reviewed today: I reviewed hospital records extensively and discussed the findings with the patient.   Recent Labs: No results found for requested labs within last 8760 hours.  Recent Lipid Panel No results found for: CHOL, TRIG, HDL, CHOLHDL, VLDL, LDLCALC, LDLDIRECT  Physical Exam:    VS:  BP 106/60   Pulse 60   Ht 5\' 5"  (5.093 m)   Wt 178 lb 12.8 oz (81.1 kg)   SpO2 95%   BMI 29.75 kg/m     Wt Readings from Last 3 Encounters:  07/09/17 178 lb 12.8 oz (81.1 kg)  06/28/17 184 lb (83.5 kg)  05/16/17 182 lb  1.9 oz (82.6 kg)     GEN: Patient is in no acute distress HEENT: Normal NECK: No JVD; No carotid bruits LYMPHATICS: No lymphadenopathy CARDIAC: Hear sounds regular, 2/6 systolic murmur at the apex. RESPIRATORY:  Clear to auscultation without rales, wheezing or rhonchi  ABDOMEN: Soft, non-tender, non-distended MUSCULOSKELETAL:  No edema; No deformity  SKIN: Warm and dry NEUROLOGIC:  Alert and oriented x 3 PSYCHIATRIC:  Normal affect   Signed, Jenean Lindau, MD  07/09/2017 11:11 AM    [place]

## 2017-07-10 LAB — BASIC METABOLIC PANEL
BUN/Creatinine Ratio: 22 (ref 12–28)
BUN: 28 mg/dL — ABNORMAL HIGH (ref 8–27)
CALCIUM: 10.1 mg/dL (ref 8.7–10.3)
CO2: 34 mmol/L — AB (ref 20–29)
CREATININE: 1.25 mg/dL — AB (ref 0.57–1.00)
Chloride: 87 mmol/L — ABNORMAL LOW (ref 96–106)
GFR calc Af Amer: 50 mL/min/{1.73_m2} — ABNORMAL LOW (ref >59)
GFR, EST NON AFRICAN AMERICAN: 44 mL/min/{1.73_m2} — AB (ref >59)
Glucose: 94 mg/dL (ref 65–99)
Potassium: 4 mmol/L (ref 3.5–5.2)
Sodium: 143 mmol/L (ref 134–144)

## 2017-07-10 MED ORDER — METOLAZONE 2.5 MG PO TABS: 2.5000 mg | ORAL_TABLET | Freq: Every day | ORAL | 3 refills | Status: DC

## 2017-07-10 NOTE — Addendum Note (Signed)
Addended by: Mattie Marlin on: 07/10/2017 02:48 PM   Modules accepted: Orders

## 2017-07-11 ENCOUNTER — Other Ambulatory Visit: Payer: Self-pay

## 2017-07-11 ENCOUNTER — Telehealth: Payer: Self-pay | Admitting: Cardiology

## 2017-07-11 DIAGNOSIS — I5043 Acute on chronic combined systolic (congestive) and diastolic (congestive) heart failure: Secondary | ICD-10-CM

## 2017-07-11 MED ORDER — METOLAZONE 2.5 MG PO TABS: 2.5000 mg | ORAL_TABLET | Freq: Every day | ORAL | 3 refills | Status: DC

## 2017-07-11 NOTE — Telephone Encounter (Signed)
Called [facility] and verified that there was Metolazone available. Pharmacy verified that there is an available quantity. Called and left voicemail for patient, as well as I resent the med in.

## 2017-07-11 NOTE — Telephone Encounter (Signed)
All the pharmacies are out of Metolazone

## 2017-07-17 ENCOUNTER — Telehealth: Payer: Self-pay | Admitting: Cardiology

## 2017-07-17 NOTE — Telephone Encounter (Signed)
Returned page to patient's daughter who is confused about her medication instructions from her office visit. Reviewed the note from office visit, note says to stop torsemide and start metolazone 2.5 mg daily. Per the daughter, the patient has not started taking daily metolazone. I advised her to call Dr. Julien Nordmann office tomorrow for clarification as I am unsure whether or not this is a mistake for her to take daily metolazone and no torsemide? Will route to RN

## 2017-07-18 ENCOUNTER — Telehealth: Payer: Self-pay

## 2017-07-18 NOTE — Telephone Encounter (Signed)
Requested the patient be fit in the schedule for the heart failure clinic as she seems to be an appropriate fit for their patient dynamics. Patient informed Raven, CMA that the Metolazone 2.5mg  that was recently prescribed has not shown to have any improvement. Jinny Blossom, RN agreed that the patient seemed appropriate and that Nira Conn, RN would be contacting the patient to have her worked into the schedule. Echo from [facility] was faxed over to the heart failure clinic. Mattie Marlin, RN, BSN

## 2017-07-18 NOTE — Telephone Encounter (Signed)
Patient stated that she has been taking her medication correctly and confirmed this by stating which meds she has been taking daily is metolazone. Informed patient that the heart failure clinic would be in contact with her, patient was agreeable.

## 2017-07-23 ENCOUNTER — Encounter: Payer: Self-pay | Admitting: Cardiology

## 2017-07-23 ENCOUNTER — Ambulatory Visit (INDEPENDENT_AMBULATORY_CARE_PROVIDER_SITE_OTHER): Payer: Medicare Other | Admitting: Cardiology

## 2017-07-23 VITALS — BP 112/68 | HR 67 | Ht 65.0 in | Wt 177.1 lb

## 2017-07-23 DIAGNOSIS — E785 Hyperlipidemia, unspecified: Secondary | ICD-10-CM | POA: Diagnosis not present

## 2017-07-23 DIAGNOSIS — I10 Essential (primary) hypertension: Secondary | ICD-10-CM | POA: Diagnosis not present

## 2017-07-23 DIAGNOSIS — I5043 Acute on chronic combined systolic (congestive) and diastolic (congestive) heart failure: Secondary | ICD-10-CM | POA: Diagnosis not present

## 2017-07-23 DIAGNOSIS — Z95810 Presence of automatic (implantable) cardiac defibrillator: Secondary | ICD-10-CM | POA: Diagnosis not present

## 2017-07-23 DIAGNOSIS — I428 Other cardiomyopathies: Secondary | ICD-10-CM

## 2017-07-23 NOTE — Patient Instructions (Signed)
Medication Instructions:  Your physician recommends that you continue on your current medications as directed. Please refer to the Current Medication list given to you today.   Labwork: BMP today  Testing/Procedures: None  Follow-Up: Your physician recommends that you schedule a follow-up appointment in: 1 month   Any Other Special Instructions Will Be Listed Below (If Applicable).  You will be seen in the [facility] heart failure clinic they will call for appointment    If you need a refill on your cardiac medications before your next appointment, please call your pharmacy.   [place], RN, BSN

## 2017-07-23 NOTE — Addendum Note (Signed)
Addended by: Mattie Marlin on: 07/23/2017 11:41 AM   Modules accepted: Orders

## 2017-07-23 NOTE — Progress Notes (Signed)
Cardiology Office Note:    Date:  07/23/2017   ID:  NILANI HUGILL, DOB August 16, 1946, MRN [id number]  PCP:  [name], MD  Cardiologist:  Jenean Lindau, MD   Referring MD: [name], MD    ASSESSMENT:    1. Acute on chronic combined systolic and diastolic CHF (congestive heart failure) ([place])   2. Essential hypertension   3. Nonischemic cardiomyopathy ([place])    4. Dyslipidemia   5. ICD (implantable cardioverter-defibrillator) in place    PLAN:    In order of problems listed above:  1. Congestive heart failure education was given to the patient at extensive length and she localized understanding. She will keep her medications stressed the same. Her blood pressure stable. She will have a Chem-7 today. Her daughter had multiple questions which were answered to her satisfaction. I also talked to her other daughter on the phone. 2. Patient was advised about congestive. Clinic and she is agreeable. They will help her find therapy. Compliance with diet and medications stressed and multiple questions were answered to their satisfaction.  Medication Adjustments/Labs and Tests Ordered: Current medicines are reviewed at length with the patient today.  Concerns regarding medicines are outlined above.  No orders of the defined types were placed in this encounter.  No orders of the defined types were placed in this encounter.    Chief Complaint  Patient presents with  . Follow-up    Still having SOB and Some Dizziness      History of Present Illness:    EARLE BURSON is a 71 y.o. female . She has past medical history of advanced congestive heart failure. She mentions to me that she's feeling some better as far as her congestive heart failure is concerned. She was extensively evaluated and treated and discharged from the hospital. She was on the orthostatics side when she saw me last time and I cut down her diuretic therapy. Also her renal function was down some the last  time I saw her. She mentions to me that she is feeling better but does have shortness of breath on exertion  Past Medical History:  Diagnosis Date  . AICD (automatic cardioverter/defibrillator) present    Biotroniks  . Arthritis    knees with arthritis as well as has back, pt. remarks that her hands fall asleep when she is in the bed or in certain positions   . Bell's palsy   . Carotid artery occlusion   . CHF (congestive heart failure) ([place])   . COPD (chronic obstructive pulmonary disease) ([place])   . Diabetes mellitus without complication (HCC)    borderline  . GERD (gastroesophageal reflux disease)    uses otc for occas. heartburn   . Hyperlipidemia   . Hypertension   . Neuropathy   . Non-ischemic cardiomyopathy ([place])   . Pneumonia    hosp. in [place]-2015  . Shortness of breath dyspnea     Past Surgical History:  Procedure Laterality Date  . ABDOMINAL AORTAGRAM N/A 11/27/2014   Procedure: ABDOMINAL Maxcine Ham;  Surgeon: Elam Dutch, MD;  Location: [facility] CATH LAB;  Service: Cardiovascular;  Laterality: N/A;  . ABDOMINAL HYSTERECTOMY    . APPENDECTOMY    . CARDIAC CATHETERIZATION     02/20/13: Normal coronaries, moderate LV dysfunction, EF 35%. Medical RX ([facility])  . CARDIAC DEFIBRILLATOR PLACEMENT  Aug. 2014   dual chamber Biotronik ICD 05/06/13 ([facility], Dr. Minna Merritts)  . ENDARTERECTOMY FEMORAL Right 12/21/2014   Procedure: ENDARTERECTOMY FEMORAL;  Surgeon: Juanda Crumble  Antony Blackbird, MD;  Location: [place];  Service: Vascular;  Laterality: Right;  . FEMORAL-FEMORAL BYPASS GRAFT Bilateral 12/21/2014   Procedure: BYPASS GRAFT LEFT FEMORAL-RIGHT FEMORAL ARTERY;  Surgeon: Elam Dutch, MD;  Location: [place];  Service: Vascular;  Laterality: Bilateral;  . JOINT REPLACEMENT Bilateral    implants  . TUBAL LIGATION      Current Medications: Current Meds  Medication Sig  . acetaminophen (TYLENOL) 650 MG CR tablet Take 650 mg by mouth every 8 (eight) hours as needed for pain.  [name] albuterol  (PROVENTIL HFA;VENTOLIN HFA) 108 (90 BASE) MCG/ACT inhaler Inhale 2 puffs into the lungs every 6 (six) hours as needed for wheezing or shortness of breath.  [name] aspirin EC 81 MG tablet Take 162 mg by mouth daily.  . carvedilol (COREG) 12.5 MG tablet Take 12.5 mg by mouth 2 (two) times daily with a meal.  . digoxin (LANOXIN) 0.125 MG tablet Take 0.0625 mg by mouth 3 (three) times a week. Take 1/2 Tab Monday Wednesday and Friday  . fluticasone furoate-vilanterol (BREO ELLIPTA) 200-25 MCG/INH AEPB Inhale 1 puff into the lungs daily.  [name] glimepiride (AMARYL) 2 MG tablet Take 2 mg by mouth daily.  [name] LYRICA 75 MG capsule every morning.  . metolazone (ZAROXOLYN) 2.5 MG tablet Take 1 tablet (2.5 mg total) by mouth daily.  . OXcarbazepine (TRILEPTAL) 150 MG tablet Take 150 mg by mouth 2 (two) times daily.  . potassium chloride (K-DUR) 10 MEQ tablet Take 10 mEq by mouth daily.     Allergies:   Oxycodone   Social History   Social History  . Marital status: Married    Spouse name: N/A  . Number of children: N/A  . Years of education: N/A   Social History Main Topics  . Smoking status: Former Smoker    Types: Cigarettes    Quit date: 11/14/2010  . Smokeless tobacco: Never Used  . Alcohol use No  . Drug use: No  . Sexual activity: Not Asked   Other Topics Concern  . None   Social History Narrative  . None     Family History: The patient's family history includes Heart disease in her mother; Hypertension in her mother.  ROS:   Please see the history of present illness.    All other systems reviewed and are negative.  EKGs/Labs/Other Studies Reviewed:    The following studies were reviewed today: I discussed my findings with the patient at extensive length including blood work.   Recent Labs: 07/09/2017: BUN 28; Creatinine, Ser 1.25; Potassium 4.0; Sodium 143  Recent Lipid Panel No results found for: CHOL, TRIG, HDL, CHOLHDL, VLDL, LDLCALC, LDLDIRECT  Physical Exam:    VS:  BP  112/68   Pulse 67   Ht 5\' 5"  (1.651 m)   Wt 177 lb 1.3 oz (80.3 kg)   SpO2 94%   BMI 29.47 kg/m     Wt Readings from Last 3 Encounters:  07/23/17 177 lb 1.3 oz (80.3 kg)  07/09/17 178 lb 12.8 oz (81.1 kg)  06/28/17 184 lb (83.5 kg)     GEN: Patient is in no acute distress HEENT: Normal NECK: No JVD; No carotid bruits LYMPHATICS: No lymphadenopathy CARDIAC: Hear sounds regular, 2/6 systolic murmur at the apex. RESPIRATORY:  Clear to auscultation without rales, wheezing or rhonchi  ABDOMEN: Soft, non-tender, non-distended MUSCULOSKELETAL:  No edema; No deformity  SKIN: Warm and dry NEUROLOGIC:  Alert and oriented x 3 PSYCHIATRIC:  Normal affect   Signed,  Jenean Lindau, MD  07/23/2017 11:06 AM    [place]

## 2017-07-24 ENCOUNTER — Telehealth (HOSPITAL_COMMUNITY): Payer: Self-pay | Admitting: Cardiology

## 2017-07-24 LAB — BASIC METABOLIC PANEL
BUN / CREAT RATIO: 21 (ref 12–28)
BUN: 23 mg/dL (ref 8–27)
CALCIUM: 9.7 mg/dL (ref 8.7–10.3)
CO2: 30 mmol/L — ABNORMAL HIGH (ref 20–29)
Chloride: 97 mmol/L (ref 96–106)
Creatinine, Ser: 1.1 mg/dL — ABNORMAL HIGH (ref 0.57–1.00)
GFR calc non Af Amer: 51 mL/min/{1.73_m2} — ABNORMAL LOW (ref >59)
GFR, EST AFRICAN AMERICAN: 59 mL/min/{1.73_m2} — AB (ref >59)
Glucose: 98 mg/dL (ref 65–99)
POTASSIUM: 4.2 mmol/L (ref 3.5–5.2)
Sodium: 142 mmol/L (ref 134–144)

## 2017-07-24 NOTE — Telephone Encounter (Signed)
Left message for patient's daughter, Lattie Haw [facility]), to call back.  Need to give her appt info for her mom.

## 2017-07-26 NOTE — Telephone Encounter (Signed)
Patient's daughter, Lattie Haw, returned my call.  She is aware of appt with Dr. Aundra Dubin on 08/24/17 at 1:40 pm.  New pt packet mailed to pt.

## 2017-08-03 ENCOUNTER — Other Ambulatory Visit: Payer: Self-pay

## 2017-08-03 DIAGNOSIS — I5043 Acute on chronic combined systolic (congestive) and diastolic (congestive) heart failure: Secondary | ICD-10-CM

## 2017-08-03 MED ORDER — METOLAZONE 2.5 MG PO TABS: 2.5000 mg | ORAL_TABLET | Freq: Every day | ORAL | 0 refills | Status: DC

## 2017-08-07 ENCOUNTER — Other Ambulatory Visit: Payer: Self-pay

## 2017-08-07 DIAGNOSIS — I5043 Acute on chronic combined systolic (congestive) and diastolic (congestive) heart failure: Secondary | ICD-10-CM

## 2017-08-07 MED ORDER — METOLAZONE 2.5 MG PO TABS: 2.5000 mg | ORAL_TABLET | Freq: Every day | ORAL | 3 refills | Status: DC

## 2017-08-15 ENCOUNTER — Ambulatory Visit (INDEPENDENT_AMBULATORY_CARE_PROVIDER_SITE_OTHER): Payer: Medicare Other | Admitting: *Deleted

## 2017-08-15 DIAGNOSIS — I428 Other cardiomyopathies: Secondary | ICD-10-CM | POA: Diagnosis not present

## 2017-08-15 NOTE — Progress Notes (Signed)
Remote ICD transmission.   

## 2017-08-20 ENCOUNTER — Telehealth: Payer: Self-pay | Admitting: Cardiology

## 2017-08-20 NOTE — Telephone Encounter (Signed)
That last new pill he gave her isn't working/she didn't know the name

## 2017-08-20 NOTE — Telephone Encounter (Signed)
If the daughter cannot get her mother in sooner she will contact myself to see what can be done for the patient to ensure that she does not gain an excessive amount of fluid.

## 2017-08-20 NOTE — Telephone Encounter (Signed)
Spoke with the daughter and patient to inform her that she has an upcoming appointment with the CHF clinic and that we would hope to not make any med changes so close to the appointment if possible. The two were agreeable and the daughter stated that she would attempt to get a sooner appointment.

## 2017-08-23 ENCOUNTER — Ambulatory Visit: Payer: Self-pay | Admitting: Cardiology

## 2017-08-24 ENCOUNTER — Ambulatory Visit (HOSPITAL_COMMUNITY)
Admission: RE | Admit: 2017-08-24 | Discharge: 2017-08-24 | Disposition: A | Discharge status: Home / Self Care | Payer: Medicare Other | Source: Ambulatory Visit | Attending: Cardiology | Admitting: Cardiology

## 2017-08-24 ENCOUNTER — Encounter (HOSPITAL_COMMUNITY): Payer: Self-pay | Admitting: *Deleted

## 2017-08-24 ENCOUNTER — Encounter (HOSPITAL_COMMUNITY): Payer: Self-pay | Admitting: Cardiology

## 2017-08-24 ENCOUNTER — Encounter: Payer: Self-pay | Admitting: Cardiology

## 2017-08-24 ENCOUNTER — Other Ambulatory Visit (HOSPITAL_COMMUNITY): Payer: Self-pay | Admitting: *Deleted

## 2017-08-24 VITALS — BP 140/82 | HR 76 | Wt 182.0 lb

## 2017-08-24 DIAGNOSIS — Z79899 Other long term (current) drug therapy: Secondary | ICD-10-CM | POA: Insufficient documentation

## 2017-08-24 DIAGNOSIS — Z9889 Other specified postprocedural states: Secondary | ICD-10-CM | POA: Diagnosis not present

## 2017-08-24 DIAGNOSIS — E119 Type 2 diabetes mellitus without complications: Secondary | ICD-10-CM | POA: Diagnosis not present

## 2017-08-24 DIAGNOSIS — E785 Hyperlipidemia, unspecified: Secondary | ICD-10-CM | POA: Diagnosis not present

## 2017-08-24 DIAGNOSIS — Z8249 Family history of ischemic heart disease and other diseases of the circulatory system: Secondary | ICD-10-CM | POA: Insufficient documentation

## 2017-08-24 DIAGNOSIS — Z95810 Presence of automatic (implantable) cardiac defibrillator: Secondary | ICD-10-CM | POA: Diagnosis not present

## 2017-08-24 DIAGNOSIS — I2510 Atherosclerotic heart disease of native coronary artery without angina pectoris: Secondary | ICD-10-CM | POA: Diagnosis not present

## 2017-08-24 DIAGNOSIS — Z87891 Personal history of nicotine dependence: Secondary | ICD-10-CM | POA: Diagnosis not present

## 2017-08-24 DIAGNOSIS — Z9049 Acquired absence of other specified parts of digestive tract: Secondary | ICD-10-CM | POA: Diagnosis not present

## 2017-08-24 DIAGNOSIS — I6523 Occlusion and stenosis of bilateral carotid arteries: Secondary | ICD-10-CM | POA: Diagnosis not present

## 2017-08-24 DIAGNOSIS — R079 Chest pain, unspecified: Secondary | ICD-10-CM | POA: Insufficient documentation

## 2017-08-24 DIAGNOSIS — Z7984 Long term (current) use of oral hypoglycemic drugs: Secondary | ICD-10-CM | POA: Diagnosis not present

## 2017-08-24 DIAGNOSIS — I70219 Atherosclerosis of native arteries of extremities with intermittent claudication, unspecified extremity: Secondary | ICD-10-CM | POA: Diagnosis not present

## 2017-08-24 DIAGNOSIS — I429 Cardiomyopathy, unspecified: Secondary | ICD-10-CM | POA: Insufficient documentation

## 2017-08-24 DIAGNOSIS — I5022 Chronic systolic (congestive) heart failure: Secondary | ICD-10-CM

## 2017-08-24 DIAGNOSIS — Z96653 Presence of artificial knee joint, bilateral: Secondary | ICD-10-CM | POA: Diagnosis not present

## 2017-08-24 DIAGNOSIS — J449 Chronic obstructive pulmonary disease, unspecified: Secondary | ICD-10-CM | POA: Insufficient documentation

## 2017-08-24 DIAGNOSIS — Z7982 Long term (current) use of aspirin: Secondary | ICD-10-CM | POA: Insufficient documentation

## 2017-08-24 DIAGNOSIS — I5042 Chronic combined systolic (congestive) and diastolic (congestive) heart failure: Secondary | ICD-10-CM | POA: Diagnosis not present

## 2017-08-24 DIAGNOSIS — M170 Bilateral primary osteoarthritis of knee: Secondary | ICD-10-CM | POA: Insufficient documentation

## 2017-08-24 LAB — COMPREHENSIVE METABOLIC PANEL
ALBUMIN: 3.5 g/dL (ref 3.5–5.0)
ALK PHOS: 86 U/L (ref 38–126)
ALT: 36 U/L (ref 14–54)
ANION GAP: 9 (ref 5–15)
AST: 35 U/L (ref 15–41)
BILIRUBIN TOTAL: 0.8 mg/dL (ref 0.3–1.2)
BUN: 20 mg/dL (ref 6–20)
CALCIUM: 9.1 mg/dL (ref 8.9–10.3)
CO2: 33 mmol/L — AB (ref 22–32)
Chloride: 96 mmol/L — ABNORMAL LOW (ref 101–111)
Creatinine, Ser: 1.18 mg/dL — ABNORMAL HIGH (ref 0.44–1.00)
GFR calc Af Amer: 53 mL/min — ABNORMAL LOW (ref >60)
GFR calc non Af Amer: 46 mL/min — ABNORMAL LOW (ref >60)
GLUCOSE: 136 mg/dL — AB (ref 65–99)
Potassium: 4 mmol/L (ref 3.5–5.1)
SODIUM: 138 mmol/L (ref 135–145)
TOTAL PROTEIN: 6.6 g/dL (ref 6.5–8.1)

## 2017-08-24 LAB — TSH: TSH: 3.723 u[IU]/mL (ref 0.350–4.500)

## 2017-08-24 LAB — DIGOXIN LEVEL: Digoxin Level: 0.3 ng/mL — ABNORMAL LOW (ref 0.8–2.0)

## 2017-08-24 MED ORDER — TORSEMIDE 20 MG PO TABS: 40.0000 mg | ORAL_TABLET | Freq: Two times a day (BID) | ORAL | 3 refills | Status: DC

## 2017-08-24 MED ORDER — SACUBITRIL-VALSARTAN 24-26 MG PO TABS: 1.0000 | ORAL_TABLET | Freq: Two times a day (BID) | ORAL | 3 refills | Status: DC

## 2017-08-24 MED ORDER — ATORVASTATIN CALCIUM 20 MG PO TABS: 20.0000 mg | ORAL_TABLET | Freq: Every day | ORAL | 3 refills | Status: DC

## 2017-08-24 NOTE — Patient Instructions (Signed)
Labs today (will call for abnormal results, otherwise no news is good news)  STOP taking metolazone  INCREASE atorvastatin to 20 mg (1 Tablet) Once Daily  START taking torsemide 40 mg (2 Tablets) Once Daily  START taking Entresto 24-26 mg (1 Tablet) Twice Daily  You have been scheduled for a Right/Left Heart Catheterization, please see attached instructions.  Follow up in 2 weeks

## 2017-08-27 ENCOUNTER — Telehealth (HOSPITAL_COMMUNITY): Payer: Self-pay | Admitting: Cardiology

## 2017-08-27 ENCOUNTER — Telehealth (HOSPITAL_COMMUNITY): Payer: Self-pay | Admitting: *Deleted

## 2017-08-27 NOTE — Telephone Encounter (Signed)
Patient is scheduled for R/L heart cath on 08/28/17 with Dr Aundra Dubin Cpt code [phone] With patients current insurance [facility]  Pre cert # [id number] ex 10/11/17

## 2017-08-27 NOTE — H&P (View-Only) (Signed)
PCP: Dr. Loistine Simas Cardiology: Dr. Geraldo Pitter HF Cardiology: Dr. Aundra Dubin  71 yo with history of PAD, COPD, carotid stenosis, and chronic systolic CHF was referred by Dr. Geraldo Pitter for evaluation/treatment of CHF.    Patient has a long history of vascular disease.  She had left-right fem-fem bypass in 3/16. She has moderate bilateral carotid stenosis.  Echo in 2/18 showed EF 20-25%.  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  She was admitted in 9/18 to the hospital in [facility] for about 4 days with acute on chronic systolic CHF.    Currently, she is short of breath after walking about 100 feet.  This has been worsening.  She does try to ride an exercise bike.  She gets very short of breath trying to walk on a treadmill.  She sleeps on 1 pillow but has episodes that sound like PND 1-2 times/week.  She is very fatigued when she tries to work in [employer].  Weight has been rising at home.  She has bilateral knee pain with ambulation but denies claudication-type pain.  She has episodes of chest tightness centrally.  This has worsened recently also.  Not clearly triggered by exertion.  Episodes resolve in a few minutes. In the past, she was on torsemide.  She says that she was taken off this because it was not working well and told to take metolazone alone.  He only diuretic currently is metolazone 2.5 mg daily.   ECG (personally reviewed): a-paced, v-sensed with LAFB, septal Qs   Labs (8/18): LDL 80, HDL 46 Labs (10/18): K 4.2, creatinine 1.1  PMH: 1. PAD: L=>R fem-fem bypass in 3/16.   - ABIs (3/18): Mildly abnormal, 0.81 left and 0.85 right.  2. H/o appendectomy 3. COPD: Quit smoking in 2/12.  4. Arthritis: Bilateral TKR 5. Diet-controlled diabetes 6. Hyperlipidemia 7. Carotid stenosis:  - 10/18 Carotid dopplers: 60-79% RICA stenosis, 45-80% LICA stenosis, left subclavian steal.  8. Chronic systolic CHF: Echo (9/98) with EF 20-25%, mild LV dilation, normal RV  size and systolic function.  - LHC (10/17): Nonobstructive CAD.  - Biotronik ICD.   SH: Lives with husband in [place], 3 daughters.  Quit smoking in 2012.    Family History  Problem Relation Age of Onset  . Heart disease Mother   . Hypertension Mother    ROS: All systems reviewed and negative except as per HPI.   Current Outpatient Medications  Medication Sig Dispense Refill  . acetaminophen (TYLENOL) 650 MG CR tablet Take 650 mg by mouth every 8 (eight) hours as needed for pain.    [name] albuterol (PROVENTIL HFA;VENTOLIN HFA) 108 (90 BASE) MCG/ACT inhaler Inhale 2 puffs into the lungs every 6 (six) hours as needed for wheezing or shortness of breath.    [name] aspirin EC 81 MG tablet Take 162 mg by mouth daily.    [name] atorvastatin (LIPITOR) 20 MG tablet Take 1 tablet (20 mg total) by mouth daily. 30 tablet 3  . carvedilol (COREG) 12.5 MG tablet Take 12.5 mg by mouth 2 (two) times daily with a meal.    . digoxin (LANOXIN) 0.125 MG tablet Take 0.0625 mg by mouth 3 (three) times a week. Take 1/2 Tab Monday Wednesday and Friday    . fluticasone furoate-vilanterol (BREO ELLIPTA) 200-25 MCG/INH AEPB Inhale 1 puff into the lungs daily.    [name] glimepiride (AMARYL) 2 MG tablet Take 2 mg by mouth daily.    [name] LYRICA 75 MG capsule every morning.    [name]  OXcarbazepine (TRILEPTAL) 150 MG tablet Take 150 mg by mouth 2 (two) times daily.    . potassium chloride (K-DUR) 10 MEQ tablet Take 10 mEq by mouth daily.    . sacubitril-valsartan (ENTRESTO) 24-26 MG Take 1 tablet by mouth 2 (two) times daily. 60 tablet 3  . torsemide (DEMADEX) 20 MG tablet Take 2 tablets (40 mg total) by mouth 2 (two) times daily. 180 tablet 3   No current facility-administered medications for this encounter.    BP 140/82 (BP Location: Right Arm, Patient Position: Sitting, Cuff Size: Normal)   Pulse 76   Wt 182 lb (82.6 kg)   SpO2 90%   BMI 30.29 kg/m  General: NAD Neck: JVP 14 cm, no thyromegaly or thyroid nodule.  Lungs: Decreased  breath sounds bilaterally.  CV: Nondisplaced PMI.  Heart regular S1/S2, no S3/S4, no murmur.  1+ edema 3/4 to knees bilaterally.  No carotid bruit.  Normal pedal pulses.  Abdomen: Soft, nontender, no hepatosplenomegaly, no distention.  Skin: Intact without lesions or rashes.  Neurologic: Alert and oriented x 3.  Psych: Normal affect. Extremities: No clubbing or cyanosis.  HEENT: Normal.   Assessment/Plan: 1. Chronic systolic CHF: ?nonischemic cardiomyopathy.  EF 20-25% on echo in 2/18.  LHC in 10/17 showed nonobstructive CAD. She certainly is at risk for CAD given given severe PAD and significant carotid stenosis.  She has a Biotronik ICD. On exam, she is volume overloaded. NYHA class III symptoms, worsening.  Weight is up.  She additionally has been having increasing chest pain episodes (though atypical).  - Stop metolazone.  - Start torsemide 40 mg daily and follow response.  She may need a higher dose.  - Start Entresto 24/26 bid.  - Continue current Coreg 12.5 mg bid. - Continue digoxin, check level.  - BMET today and again in 10 days.  - Given chest pain and concern for CAD as cause of clinical worsening, I am going to arrange for coronary angiography.  I will also do RHC to assess filling pressures and cardiac output. I discussed risks/benefits of procedure with patient and she agrees to go forward with cath.  2. PAD: s/p fem-fem bypass, followed by [facility].  No claudication, last ABIs mildly abnormal. 3. Carotid stenosis: Moderate bilateral stenosis on last dopplers, followed at [facility].  4. Hyperlipidemia: Goal LDL < 70 with vascular disease.  Will increase atorvastatin to 20 mg daily with lipids/LFTs in 2 months.   Loralie Champagne 08/27/2017

## 2017-08-27 NOTE — Progress Notes (Signed)
PCP: Dr. Loistine Simas Cardiology: Dr. Geraldo Pitter HF Cardiology: Dr. Aundra Dubin  71 yo with history of PAD, COPD, carotid stenosis, and chronic systolic CHF was referred by Dr. Geraldo Pitter for evaluation/treatment of CHF.    Patient has a long history of vascular disease.  She had left-right fem-fem bypass in 3/16. She has moderate bilateral carotid stenosis.  Echo in 2/18 showed EF 20-25%.  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  She was admitted in 9/18 to the hospital in [facility] for about 4 days with acute on chronic systolic CHF.    Currently, she is short of breath after walking about 100 feet.  This has been worsening.  She does try to ride an exercise bike.  She gets very short of breath trying to walk on a treadmill.  She sleeps on 1 pillow but has episodes that sound like PND 1-2 times/week.  She is very fatigued when she tries to work in [employer].  Weight has been rising at home.  She has bilateral knee pain with ambulation but denies claudication-type pain.  She has episodes of chest tightness centrally.  This has worsened recently also.  Not clearly triggered by exertion.  Episodes resolve in a few minutes. In the past, she was on torsemide.  She says that she was taken off this because it was not working well and told to take metolazone alone.  He only diuretic currently is metolazone 2.5 mg daily.   ECG (personally reviewed): a-paced, v-sensed with LAFB, septal Qs   Labs (8/18): LDL 80, HDL 46 Labs (10/18): K 4.2, creatinine 1.1  PMH: 1. PAD: L=>R fem-fem bypass in 3/16.   - ABIs (3/18): Mildly abnormal, 0.81 left and 0.85 right.  2. H/o appendectomy 3. COPD: Quit smoking in 2/12.  4. Arthritis: Bilateral TKR 5. Diet-controlled diabetes 6. Hyperlipidemia 7. Carotid stenosis:  - 10/18 Carotid dopplers: 60-79% RICA stenosis, 22-63% LICA stenosis, left subclavian steal.  8. Chronic systolic CHF: Echo (3/35) with EF 20-25%, mild LV dilation, normal RV  size and systolic function.  - LHC (10/17): Nonobstructive CAD.  - Biotronik ICD.   SH: Lives with husband in [place], 3 daughters.  Quit smoking in 2012.    Family History  Problem Relation Age of Onset  . Heart disease Mother   . Hypertension Mother    ROS: All systems reviewed and negative except as per HPI.   Current Outpatient Medications  Medication Sig Dispense Refill  . acetaminophen (TYLENOL) 650 MG CR tablet Take 650 mg by mouth every 8 (eight) hours as needed for pain.    [name] albuterol (PROVENTIL HFA;VENTOLIN HFA) 108 (90 BASE) MCG/ACT inhaler Inhale 2 puffs into the lungs every 6 (six) hours as needed for wheezing or shortness of breath.    [name] aspirin EC 81 MG tablet Take 162 mg by mouth daily.    [name] atorvastatin (LIPITOR) 20 MG tablet Take 1 tablet (20 mg total) by mouth daily. 30 tablet 3  . carvedilol (COREG) 12.5 MG tablet Take 12.5 mg by mouth 2 (two) times daily with a meal.    . digoxin (LANOXIN) 0.125 MG tablet Take 0.0625 mg by mouth 3 (three) times a week. Take 1/2 Tab Monday Wednesday and Friday    . fluticasone furoate-vilanterol (BREO ELLIPTA) 200-25 MCG/INH AEPB Inhale 1 puff into the lungs daily.    [name] glimepiride (AMARYL) 2 MG tablet Take 2 mg by mouth daily.    [name] LYRICA 75 MG capsule every morning.    [name]  OXcarbazepine (TRILEPTAL) 150 MG tablet Take 150 mg by mouth 2 (two) times daily.    . potassium chloride (K-DUR) 10 MEQ tablet Take 10 mEq by mouth daily.    . sacubitril-valsartan (ENTRESTO) 24-26 MG Take 1 tablet by mouth 2 (two) times daily. 60 tablet 3  . torsemide (DEMADEX) 20 MG tablet Take 2 tablets (40 mg total) by mouth 2 (two) times daily. 180 tablet 3   No current facility-administered medications for this encounter.    BP 140/82 (BP Location: Right Arm, Patient Position: Sitting, Cuff Size: Normal)   Pulse 76   Wt 182 lb (82.6 kg)   SpO2 90%   BMI 30.29 kg/m  General: NAD Neck: JVP 14 cm, no thyromegaly or thyroid nodule.  Lungs: Decreased  breath sounds bilaterally.  CV: Nondisplaced PMI.  Heart regular S1/S2, no S3/S4, no murmur.  1+ edema 3/4 to knees bilaterally.  No carotid bruit.  Normal pedal pulses.  Abdomen: Soft, nontender, no hepatosplenomegaly, no distention.  Skin: Intact without lesions or rashes.  Neurologic: Alert and oriented x 3.  Psych: Normal affect. Extremities: No clubbing or cyanosis.  HEENT: Normal.   Assessment/Plan: 1. Chronic systolic CHF: ?nonischemic cardiomyopathy.  EF 20-25% on echo in 2/18.  LHC in 10/17 showed nonobstructive CAD. She certainly is at risk for CAD given given severe PAD and significant carotid stenosis.  She has a Biotronik ICD. On exam, she is volume overloaded. NYHA class III symptoms, worsening.  Weight is up.  She additionally has been having increasing chest pain episodes (though atypical).  - Stop metolazone.  - Start torsemide 40 mg daily and follow response.  She may need a higher dose.  - Start Entresto 24/26 bid.  - Continue current Coreg 12.5 mg bid. - Continue digoxin, check level.  - BMET today and again in 10 days.  - Given chest pain and concern for CAD as cause of clinical worsening, I am going to arrange for coronary angiography.  I will also do RHC to assess filling pressures and cardiac output. I discussed risks/benefits of procedure with patient and she agrees to go forward with cath.  2. PAD: s/p fem-fem bypass, followed by [facility].  No claudication, last ABIs mildly abnormal. 3. Carotid stenosis: Moderate bilateral stenosis on last dopplers, followed at [facility].  4. Hyperlipidemia: Goal LDL < 70 with vascular disease.  Will increase atorvastatin to 20 mg daily with lipids/LFTs in 2 months.   Loralie Champagne 08/27/2017

## 2017-08-27 NOTE — Telephone Encounter (Signed)
Check with patient and see how low BP was.  Hold off on Entresto if SBP was < 90.

## 2017-08-27 NOTE — Telephone Encounter (Signed)
Received call from [facility] case management stating she spoke with patient who reported dizziness and a "very low" BP this past Saturday. [facility] was [facility]. [name] recently started and patient reported holding doses since Saturday. Will report to Dr Aundra Dubin for further instruction.   Balinda Quails RN, VAD Coordinator 24/7 pager [phone]

## 2017-08-28 ENCOUNTER — Encounter (HOSPITAL_COMMUNITY)
Admission: RE | Disposition: A | Discharge status: Home / Self Care | Payer: Self-pay | Source: Ambulatory Visit | Attending: Cardiology

## 2017-08-28 ENCOUNTER — Ambulatory Visit (HOSPITAL_COMMUNITY)
Admission: RE | Admit: 2017-08-28 | Discharge: 2017-08-28 | Disposition: A | Discharge status: Home / Self Care | Payer: Medicare Other | Source: Ambulatory Visit | Attending: Cardiology | Admitting: Cardiology

## 2017-08-28 DIAGNOSIS — I110 Hypertensive heart disease with heart failure: Secondary | ICD-10-CM | POA: Insufficient documentation

## 2017-08-28 DIAGNOSIS — I6523 Occlusion and stenosis of bilateral carotid arteries: Secondary | ICD-10-CM | POA: Insufficient documentation

## 2017-08-28 DIAGNOSIS — I2510 Atherosclerotic heart disease of native coronary artery without angina pectoris: Secondary | ICD-10-CM | POA: Diagnosis not present

## 2017-08-28 DIAGNOSIS — E785 Hyperlipidemia, unspecified: Secondary | ICD-10-CM | POA: Diagnosis not present

## 2017-08-28 DIAGNOSIS — Z9049 Acquired absence of other specified parts of digestive tract: Secondary | ICD-10-CM | POA: Insufficient documentation

## 2017-08-28 DIAGNOSIS — Z7984 Long term (current) use of oral hypoglycemic drugs: Secondary | ICD-10-CM | POA: Diagnosis not present

## 2017-08-28 DIAGNOSIS — I5023 Acute on chronic systolic (congestive) heart failure: Secondary | ICD-10-CM | POA: Insufficient documentation

## 2017-08-28 DIAGNOSIS — Z8249 Family history of ischemic heart disease and other diseases of the circulatory system: Secondary | ICD-10-CM | POA: Insufficient documentation

## 2017-08-28 DIAGNOSIS — E119 Type 2 diabetes mellitus without complications: Secondary | ICD-10-CM | POA: Diagnosis not present

## 2017-08-28 DIAGNOSIS — I428 Other cardiomyopathies: Secondary | ICD-10-CM | POA: Insufficient documentation

## 2017-08-28 DIAGNOSIS — Z79899 Other long term (current) drug therapy: Secondary | ICD-10-CM | POA: Insufficient documentation

## 2017-08-28 DIAGNOSIS — Z95810 Presence of automatic (implantable) cardiac defibrillator: Secondary | ICD-10-CM | POA: Insufficient documentation

## 2017-08-28 DIAGNOSIS — J449 Chronic obstructive pulmonary disease, unspecified: Secondary | ICD-10-CM | POA: Diagnosis not present

## 2017-08-28 DIAGNOSIS — I5042 Chronic combined systolic (congestive) and diastolic (congestive) heart failure: Secondary | ICD-10-CM

## 2017-08-28 DIAGNOSIS — R0600 Dyspnea, unspecified: Secondary | ICD-10-CM | POA: Diagnosis not present

## 2017-08-28 DIAGNOSIS — Z7982 Long term (current) use of aspirin: Secondary | ICD-10-CM | POA: Insufficient documentation

## 2017-08-28 DIAGNOSIS — Z87891 Personal history of nicotine dependence: Secondary | ICD-10-CM | POA: Diagnosis not present

## 2017-08-28 DIAGNOSIS — M1990 Unspecified osteoarthritis, unspecified site: Secondary | ICD-10-CM | POA: Insufficient documentation

## 2017-08-28 HISTORY — PX: RIGHT/LEFT HEART CATH AND CORONARY ANGIOGRAPHY: CATH118266

## 2017-08-28 LAB — CBC
HEMATOCRIT: 51.8 % — AB (ref 36.0–46.0)
HEMOGLOBIN: 17.3 g/dL — AB (ref 12.0–15.0)
MCH: 30.5 pg (ref 26.0–34.0)
MCHC: 33.4 g/dL (ref 30.0–36.0)
MCV: 91.2 fL (ref 78.0–100.0)
Platelets: 264 10*3/uL (ref 150–400)
RBC: 5.68 MIL/uL — AB (ref 3.87–5.11)
RDW: 14.2 % (ref 11.5–15.5)
WBC: 6.1 10*3/uL (ref 4.0–10.5)

## 2017-08-28 LAB — POCT I-STAT 3, VENOUS BLOOD GAS (G3P V)
Acid-Base Excess: 11 mmol/L — ABNORMAL HIGH (ref 0.0–2.0)
Acid-Base Excess: 12 mmol/L — ABNORMAL HIGH (ref 0.0–2.0)
BICARBONATE: 39.5 mmol/L — AB (ref 20.0–28.0)
Bicarbonate: 38.4 mmol/L — ABNORMAL HIGH (ref 20.0–28.0)
O2 SAT: 68 %
O2 Saturation: 69 %
PCO2 VEN: 59.3 mmHg (ref 44.0–60.0)
PCO2 VEN: 60.6 mmHg — AB (ref 44.0–60.0)
PH VEN: 7.419 (ref 7.250–7.430)
PH VEN: 7.423 (ref 7.250–7.430)
PO2 VEN: 36 mmHg (ref 32.0–45.0)
PO2 VEN: 37 mmHg (ref 32.0–45.0)
TCO2: 41 mmol/L — AB (ref 22–32)
TCO2: 40 mmol/L — ABNORMAL HIGH (ref 22–32)

## 2017-08-28 LAB — GLUCOSE, CAPILLARY
GLUCOSE-CAPILLARY: 94 mg/dL (ref 65–99)
Glucose-Capillary: 150 mg/dL — ABNORMAL HIGH (ref 65–99)

## 2017-08-28 LAB — PROTIME-INR
INR: 1
PROTHROMBIN TIME: 13.1 s (ref 11.4–15.2)

## 2017-08-28 SURGERY — RIGHT/LEFT HEART CATH AND CORONARY ANGIOGRAPHY
Anesthesia: LOCAL

## 2017-08-28 MED ORDER — IOPAMIDOL (ISOVUE-370) INJECTION 76%
INTRAVENOUS | Status: AC
  Filled 2017-08-28: qty 100

## 2017-08-28 MED ORDER — HEPARIN SODIUM (PORCINE) 1000 UNIT/ML IJ SOLN
INTRAMUSCULAR | Status: AC
  Filled 2017-08-28: qty 1

## 2017-08-28 MED ORDER — FENTANYL CITRATE (PF) 100 MCG/2ML IJ SOLN
INTRAMUSCULAR | Status: DC | PRN
Start: 2017-08-28 — End: 2017-08-28
  Administered 2017-08-28 (×2): 25 ug via INTRAVENOUS

## 2017-08-28 MED ORDER — SODIUM CHLORIDE 0.9 % IV SOLN
INTRAVENOUS | Status: DC
Start: 2017-08-28 — End: 2017-08-28
  Administered 2017-08-28: 12:00:00 via INTRAVENOUS

## 2017-08-28 MED ORDER — FENTANYL CITRATE (PF) 100 MCG/2ML IJ SOLN
INTRAMUSCULAR | Status: AC
  Filled 2017-08-28: qty 2

## 2017-08-28 MED ORDER — SODIUM CHLORIDE 0.9 % IV SOLN: 250.0000 mL | INTRAVENOUS | Status: DC | PRN

## 2017-08-28 MED ORDER — NITROGLYCERIN 1 MG/10 ML FOR IR/CATH LAB
INTRA_ARTERIAL | Status: DC | PRN
  Administered 2017-08-28: 200 ug via INTRA_ARTERIAL

## 2017-08-28 MED ORDER — ASPIRIN 81 MG PO CHEW: 81.0000 mg | CHEWABLE_TABLET | ORAL | Status: DC

## 2017-08-28 MED ORDER — HEPARIN SODIUM (PORCINE) 1000 UNIT/ML IJ SOLN
INTRAMUSCULAR | Status: DC | PRN
  Administered 2017-08-28: 4000 [IU] via INTRAVENOUS

## 2017-08-28 MED ORDER — NITROGLYCERIN 1 MG/10 ML FOR IR/CATH LAB
INTRA_ARTERIAL | Status: AC
  Filled 2017-08-28: qty 10

## 2017-08-28 MED ORDER — HEPARIN (PORCINE) IN NACL 2-0.9 UNIT/ML-% IJ SOLN
INTRAMUSCULAR | Status: AC | PRN
  Administered 2017-08-28: 1000 mL

## 2017-08-28 MED ORDER — LIDOCAINE HCL (PF) 1 % IJ SOLN
INTRAMUSCULAR | Status: AC
  Filled 2017-08-28: qty 30

## 2017-08-28 MED ORDER — MIDAZOLAM HCL 2 MG/2ML IJ SOLN
INTRAMUSCULAR | Status: DC | PRN
  Administered 2017-08-28: 1 mg via INTRAVENOUS

## 2017-08-28 MED ORDER — VERAPAMIL HCL 2.5 MG/ML IV SOLN
INTRAVENOUS | Status: DC | PRN
  Administered 2017-08-28: 10 mL via INTRA_ARTERIAL

## 2017-08-28 MED ORDER — SODIUM CHLORIDE 0.9% FLUSH: 3.0000 mL | Freq: Two times a day (BID) | INTRAVENOUS | Status: DC

## 2017-08-28 MED ORDER — SODIUM CHLORIDE 0.9% FLUSH: 3.0000 mL | INTRAVENOUS | Status: DC | PRN

## 2017-08-28 MED ORDER — HEPARIN (PORCINE) IN NACL 2-0.9 UNIT/ML-% IJ SOLN
INTRAMUSCULAR | Status: AC
  Filled 2017-08-28: qty 1000

## 2017-08-28 MED ORDER — VERAPAMIL HCL 2.5 MG/ML IV SOLN
INTRAVENOUS | Status: AC
  Filled 2017-08-28: qty 2

## 2017-08-28 MED ORDER — IOPAMIDOL (ISOVUE-370) INJECTION 76%
INTRAVENOUS | Status: DC | PRN
  Administered 2017-08-28: 50 mL via INTRA_ARTERIAL

## 2017-08-28 MED ORDER — LIDOCAINE HCL (PF) 1 % IJ SOLN
INTRAMUSCULAR | Status: DC | PRN
  Administered 2017-08-28 (×2): 2 mL via SUBCUTANEOUS

## 2017-08-28 MED ORDER — MIDAZOLAM HCL 2 MG/2ML IJ SOLN
INTRAMUSCULAR | Status: AC
  Filled 2017-08-28: qty 2

## 2017-08-28 SURGICAL SUPPLY — 14 items
CATH BALLN WEDGE 5F 110CM (CATHETERS) ×1 IMPLANT
CATH INFINITI 5 FR JL3.5 (CATHETERS) ×1 IMPLANT
CATH INFINITI JR4 5F (CATHETERS) ×1 IMPLANT
DEVICE RAD COMP TR BAND LRG (VASCULAR PRODUCTS) ×1 IMPLANT
GLIDESHEATH SLEND SS 6F .021 (SHEATH) ×1 IMPLANT
GUIDEWIRE INQWIRE 1.5J.035X260 (WIRE) IMPLANT
INQWIRE 1.5J .035X260CM (WIRE) ×2
KIT HEART LEFT (KITS) ×2 IMPLANT
PACK CARDIAC CATHETERIZATION (CUSTOM PROCEDURE TRAY) ×2 IMPLANT
SHEATH GLIDE SLENDER 4/5FR (SHEATH) ×1 IMPLANT
TRANSDUCER W/STOPCOCK (MISCELLANEOUS) ×2 IMPLANT
TUBING CIL FLEX 10 FLL-RA (TUBING) ×2 IMPLANT
WIRE ASAHI PROWATER 180CM (WIRE) ×1 IMPLANT
WIRE HI TORQ VERSACORE-J 145CM (WIRE) ×1 IMPLANT

## 2017-08-28 NOTE — Telephone Encounter (Signed)
Called patient was told she was not home and they would have patient call back.

## 2017-08-28 NOTE — Progress Notes (Deleted)
Electrophysiology Office Note   Date:  08/28/2017   ID:  Lisa [name], DOB 05-27-1946, MRN [id number]  PCP:  [name], MD  Cardiologist:  Revankar Primary Electrophysiologist:  Constance Haw, MD    No chief complaint on file.    History of Present Illness: Lisa [name] is a 71 y.o. female who is being seen today for the evaluation of CHF, ICD at the request of Angelina Sheriff, MD. Presenting today for electrophysiology evaluation. She has a history of congestive heart failure, hypertension, COPD, diabetes, hypertension, hyperlipidemia. She has a Biotronik ICD in place.  Today, denies symptoms of palpitations, chest pain, shortness of breath, orthopnea, PND, lower extremity edema, claudication, dizziness, presyncope, syncope, bleeding, or neurologic sequela. The patient is tolerating medications without difficulties. ***    Past Medical History:  Diagnosis Date  . AICD (automatic cardioverter/defibrillator) present    Biotroniks  . Arthritis    knees with arthritis as well as has back, pt. remarks that her hands fall asleep when she is in the bed or in certain positions   . Bell's palsy   . Carotid artery occlusion   . CHF (congestive heart failure) ([place])   . COPD (chronic obstructive pulmonary disease) ([place])   . Diabetes mellitus without complication (HCC)    borderline  . GERD (gastroesophageal reflux disease)    uses otc for occas. heartburn   . Hyperlipidemia   . Hypertension   . Neuropathy   . Non-ischemic cardiomyopathy ([place])   . Pneumonia    hosp. in [place]-2015  . Shortness of breath dyspnea    Past Surgical History:  Procedure Laterality Date  . ABDOMINAL AORTAGRAM N/A 11/27/2014   Procedure: ABDOMINAL Maxcine Ham;  Surgeon: Elam Dutch, MD;  Location: [facility] CATH LAB;  Service: Cardiovascular;  Laterality: N/A;  . ABDOMINAL HYSTERECTOMY    . APPENDECTOMY    . CARDIAC CATHETERIZATION     02/20/13: Normal coronaries, moderate LV  dysfunction, EF 35%. Medical RX ([facility])  . CARDIAC DEFIBRILLATOR PLACEMENT  Aug. 2014   dual chamber Biotronik ICD 05/06/13 ([facility], Dr. Minna Merritts)  . ENDARTERECTOMY FEMORAL Right 12/21/2014   Procedure: ENDARTERECTOMY FEMORAL;  Surgeon: Elam Dutch, MD;  Location: [place];  Service: Vascular;  Laterality: Right;  . FEMORAL-FEMORAL BYPASS GRAFT Bilateral 12/21/2014   Procedure: BYPASS GRAFT LEFT FEMORAL-RIGHT FEMORAL ARTERY;  Surgeon: Elam Dutch, MD;  Location: [place];  Service: Vascular;  Laterality: Bilateral;  . JOINT REPLACEMENT Bilateral    implants  . TUBAL LIGATION       No current facility-administered medications for this visit.    No current outpatient medications on file.   Facility-Administered Medications Ordered in Other Visits  Medication Dose Route Frequency Provider Last Rate Last Dose  . 0.9 %  sodium chloride infusion  250 mL Intravenous PRN Larey Dresser, MD      . 0.9 %  sodium chloride infusion   Intravenous Continuous Larey Dresser, MD 10 mL/hr at 08/28/17 1212    . aspirin chewable tablet 81 mg  81 mg Oral Gustavus Messing, Elby Showers, MD      . sodium chloride flush (NS) 0.9 % injection 3 mL  3 mL Intravenous Q12H Larey Dresser, MD      . sodium chloride flush (NS) 0.9 % injection 3 mL  3 mL Intravenous PRN Larey Dresser, MD        Allergies:   Oxycodone and [name] [sacubitril-valsartan]   Social History:  The patient  reports that she quit smoking about 6 years ago. Her smoking use included cigarettes. she has never used smokeless tobacco. She reports that she does not drink alcohol or use drugs.   Family History:  The patient's family history includes Heart disease in her mother; Hypertension in her mother.    ROS:  Please see the history of present illness.   Otherwise, review of systems is positive for ***.   All other systems are reviewed and negative.   PHYSICAL EXAM: VS:  There were no vitals taken for this visit. , BMI There is no height or  weight on file to calculate BMI. GEN: Well nourished, well developed, in no acute distress  HEENT: normal  Neck: no JVD, carotid bruits, or masses Cardiac: ***RRR; no murmurs, rubs, or gallops,no edema  Respiratory:  clear to auscultation bilaterally, normal work of breathing GI: soft, nontender, nondistended, + BS MS: no deformity or atrophy  Skin: warm and dry, ***device site well healed Neuro:  Strength and sensation are intact Psych: euthymic mood, full affect  EKG:  EKG {ACTION; IS/IS LKG:[id number]} ordered today. Personal review of the ekg ordered *** shows ***  ***Personal review of the device interrogation today. Results in [place]: 08/24/2017: ALT 36; BUN 20; Creatinine, Ser 1.18; Potassium 4.0; Sodium 138; TSH 3.723 08/28/2017: Hemoglobin 17.3; Platelets 264    Lipid Panel  No results found for: CHOL, TRIG, HDL, CHOLHDL, VLDL, LDLCALC, LDLDIRECT   Wt Readings from Last 3 Encounters:  08/28/17 171 lb (77.6 kg)  08/24/17 182 lb (82.6 kg)  07/23/17 177 lb 1.3 oz (80.3 kg)      Other studies Reviewed: Additional studies/ records that were reviewed today include: TTE 04/02/17  Review of the above records today demonstrates:  Severely reduced LV systolic function. Ejection fraction is visually estimated at 25% Global LV hypokinesis. Abnormal LV diastolic function with evidence of elevated LA pressure. Mildly dilated RV chamber. RV systolic function is probably normal. Biatrial dilation, mild. Moderate mitral regurgitation, probably functional. Mild tricuspid regurgitation.   ASSESSMENT AND PLAN:  1.  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 Unfortunately, her atrial threshold is gone up and it appears that her lead is not functioning appropriately. She [name] likely need lead revision. In the interim, [name] plan for a chest x-ray to determine if the lead has dislodged.***  2.  Hypertension: Currently well controlled***  3. Chest pain: Symptoms appear to be quite atypical in nature. She did have a heart catheterization less than one year ago that showed nonobstructive coronary disease. This is likely noncardiac in nature.***    Current medicines are reviewed at length with the patient today.   The patient does not have concerns regarding her medicines.  The following changes were made today:  ***  Labs/ tests ordered today include:  No orders of the defined types were placed in this encounter.    Disposition:   FU with Jozi Malachi *** months  Signed, Kendarius Vigen Meredith Leeds, MD  08/28/2017 1:10 PM     [address] [place] [address] [phone] (office) [phone] (fax)

## 2017-08-28 NOTE — Discharge Instructions (Signed)
Radial Site Care °Refer to this sheet in the next few weeks. These instructions provide you with information about caring for yourself after your procedure. Your health care provider may also give you more specific instructions. Your treatment has been planned according to current medical practices, but problems sometimes occur. Call your health care provider if you have any problems or questions after your procedure. °What can I expect after the procedure? °After your procedure, it is typical to have the following: °· Bruising at the radial site that usually fades within 1-2 weeks. °· Blood collecting in the tissue (hematoma) that may be painful to the touch. It should usually decrease in size and tenderness within 1-2 weeks. ° °Follow these instructions at home: °· Take medicines only as directed by your health care provider. °· You may shower 24-48 hours after the procedure or as directed by your health care provider. Remove the bandage (dressing) and gently wash the site with plain soap and water. Pat the area dry with a clean towel. Do not rub the site, because this may cause bleeding. °· Do not take baths, swim, or use a hot tub until your health care provider approves. °· Check your insertion site every day for redness, swelling, or drainage. °· Do not apply powder or lotion to the site. °· Do not flex or bend the affected arm for 24 hours or as directed by your health care provider. °· Do not push or pull heavy objects with the affected arm for 24 hours or as directed by your health care provider. °· Do not lift over 10 lb (4.5 kg) for 5 days after your procedure or as directed by your health care provider. °· Ask your health care provider when it is okay to: °? Return to work or school. °? Resume usual physical activities or sports. °? Resume sexual activity. °· Do not drive home if you are discharged the same day as the procedure. Have someone else drive you. °· You may drive 24 hours after the procedure  unless otherwise instructed by your health care provider. °· Do not operate machinery or power tools for 24 hours after the procedure. °· If your procedure was done as an outpatient procedure, which means that you went home the same day as your procedure, a responsible adult should be with you for the first 24 hours after you arrive home. °· Keep all follow-up visits as directed by your health care provider. This is important. °Contact a health care provider if: °· You have a fever. °· You have chills. °· You have increased bleeding from the radial site. Hold pressure on the site. °Get help right away if: °· You have unusual pain at the radial site. °· You have redness, warmth, or swelling at the radial site. °· You have drainage (other than a small amount of blood on the dressing) from the radial site. °· The radial site is bleeding, and the bleeding does not stop after 30 minutes of holding steady pressure on the site. °· Your arm or hand becomes pale, cool, tingly, or numb. °This information is not intended to replace advice given to you by your health care provider. Make sure you discuss any questions you have with your health care provider. °Document Released: 10/14/2010 Document Revised: 02/17/2016 Document Reviewed: 03/30/2014 °Elsevier Interactive Patient Education © 2018 [employer] ° ° ° °Moderate Conscious Sedation, Adult, Care After °These instructions provide you with information about caring for yourself after your procedure. Your health care provider   may also give you more specific instructions. Your treatment has been planned according to current medical practices, but problems sometimes occur. Call your health care provider if you have any problems or questions after your procedure. °What can I expect after the procedure? °After your procedure, it is common: °· To feel sleepy for several hours. °· To feel clumsy and have poor balance for several hours. °· To have poor judgment for several  hours. °· To vomit if you eat too soon. ° °Follow these instructions at home: °For at least 24 hours after the procedure: ° °· Do not: °? Participate in activities where you could fall or become injured. °? Drive. °? Use heavy machinery. °? Drink alcohol. °? Take sleeping pills or medicines that cause drowsiness. °? Make important decisions or sign legal documents. °? Take care of children on your own. °· Rest. °Eating and drinking °· Follow the diet recommended by your health care provider. °· If you vomit: °? Drink water, juice, or soup when you can drink without vomiting. °? Make sure you have little or no nausea before eating solid foods. °General instructions °· Have a responsible adult stay with you until you are awake and alert. °· Take over-the-counter and prescription medicines only as told by your health care provider. °· If you smoke, do not smoke without supervision. °· Keep all follow-up visits as told by your health care provider. This is important. °Contact a health care provider if: °· You keep feeling nauseous or you keep vomiting. °· You feel light-headed. °· You develop a rash. °· You have a fever. °Get help right away if: °· You have trouble breathing. °This information is not intended to replace advice given to you by your health care provider. Make sure you discuss any questions you have with your health care provider. °Document Released: 07/02/2013 Document Revised: 02/14/2016 Document Reviewed: 01/01/2016 °Elsevier Interactive Patient Education © 2018 [employer] ° °

## 2017-08-28 NOTE — Interval H&P Note (Signed)
History and Physical Interval Note:  08/28/2017 1:22 PM  Lisa [name]  has presented today for surgery, with the diagnosis of CHF  The various methods of treatment have been discussed with the patient and family. After consideration of risks, benefits and other options for treatment, the patient has consented to  Procedure(s): RIGHT/LEFT HEART CATH AND CORONARY ANGIOGRAPHY (N/A) as a surgical intervention .  The patient's history has been reviewed, patient examined, no change in status, stable for surgery.  I have reviewed the patient's chart and labs.  Questions were answered to the patient's satisfaction.     Denesia Donelan [employer]

## 2017-08-29 ENCOUNTER — Encounter (HOSPITAL_COMMUNITY): Payer: Self-pay | Admitting: Cardiology

## 2017-08-29 ENCOUNTER — Encounter: Payer: Self-pay | Admitting: Cardiology

## 2017-09-05 ENCOUNTER — Telehealth (HOSPITAL_COMMUNITY): Payer: Self-pay | Admitting: Cardiology

## 2017-09-05 ENCOUNTER — Telehealth: Payer: Self-pay | Admitting: Physician Assistant

## 2017-09-05 NOTE — Telephone Encounter (Signed)
Patient aware via daughter Lenna Sciara, pre visit labs placed and Levada Dy aware

## 2017-09-05 NOTE — Telephone Encounter (Signed)
Paged by answering service. Patient is not feeling well since cath and started on Entresto. Being fatigue, shaky and ? Tremors. Has appointment Friday and requesting to be seen early. Will call after 10am today to get early appointment if available and further recommendation. Hard to obtain history and symptoms.

## 2017-09-05 NOTE — Telephone Encounter (Signed)
Hold torsemide x 2 days then decrease to 20 mg daily. Have her come early to appt Friday to get BMET done so it will be ready when I see her.

## 2017-09-05 NOTE — Telephone Encounter (Signed)
PATIENTS DAUGHTER CALLED WITH CONCERNS OF INCREASED DIZZINESS AND TREMBLING OVER THE PAST WEEK Seen for cath 12/4, was told to stop entresto and decrease torsemide to 40 daily alternating with 20 daily. Patient was expecting dizziness to subside some.  Weight today 170 weight 12/10 168 B/p today 114/83  Please advise further

## 2017-09-06 ENCOUNTER — Ambulatory Visit: Payer: Medicare Other | Admitting: Cardiology

## 2017-09-07 ENCOUNTER — Ambulatory Visit (HOSPITAL_COMMUNITY)
Admission: RE | Admit: 2017-09-07 | Discharge: 2017-09-07 | Disposition: A | Discharge status: Home / Self Care | Payer: Medicare Other | Source: Ambulatory Visit | Attending: Cardiology | Admitting: Cardiology

## 2017-09-07 VITALS — BP 124/66 | HR 71 | Wt 174.8 lb

## 2017-09-07 DIAGNOSIS — E785 Hyperlipidemia, unspecified: Secondary | ICD-10-CM | POA: Diagnosis not present

## 2017-09-07 DIAGNOSIS — E119 Type 2 diabetes mellitus without complications: Secondary | ICD-10-CM | POA: Insufficient documentation

## 2017-09-07 DIAGNOSIS — I5022 Chronic systolic (congestive) heart failure: Secondary | ICD-10-CM | POA: Insufficient documentation

## 2017-09-07 DIAGNOSIS — I2510 Atherosclerotic heart disease of native coronary artery without angina pectoris: Secondary | ICD-10-CM | POA: Insufficient documentation

## 2017-09-07 DIAGNOSIS — I429 Cardiomyopathy, unspecified: Secondary | ICD-10-CM

## 2017-09-07 DIAGNOSIS — Z79899 Other long term (current) drug therapy: Secondary | ICD-10-CM | POA: Insufficient documentation

## 2017-09-07 DIAGNOSIS — M1990 Unspecified osteoarthritis, unspecified site: Secondary | ICD-10-CM | POA: Diagnosis not present

## 2017-09-07 DIAGNOSIS — Z87891 Personal history of nicotine dependence: Secondary | ICD-10-CM | POA: Diagnosis not present

## 2017-09-07 DIAGNOSIS — I428 Other cardiomyopathies: Secondary | ICD-10-CM | POA: Insufficient documentation

## 2017-09-07 DIAGNOSIS — Z96653 Presence of artificial knee joint, bilateral: Secondary | ICD-10-CM | POA: Insufficient documentation

## 2017-09-07 DIAGNOSIS — Z7982 Long term (current) use of aspirin: Secondary | ICD-10-CM | POA: Diagnosis not present

## 2017-09-07 DIAGNOSIS — Z7984 Long term (current) use of oral hypoglycemic drugs: Secondary | ICD-10-CM | POA: Diagnosis not present

## 2017-09-07 DIAGNOSIS — Z95810 Presence of automatic (implantable) cardiac defibrillator: Secondary | ICD-10-CM | POA: Insufficient documentation

## 2017-09-07 DIAGNOSIS — I6523 Occlusion and stenosis of bilateral carotid arteries: Secondary | ICD-10-CM | POA: Diagnosis not present

## 2017-09-07 DIAGNOSIS — J449 Chronic obstructive pulmonary disease, unspecified: Secondary | ICD-10-CM | POA: Diagnosis not present

## 2017-09-07 DIAGNOSIS — I739 Peripheral vascular disease, unspecified: Secondary | ICD-10-CM | POA: Insufficient documentation

## 2017-09-07 DIAGNOSIS — Z9049 Acquired absence of other specified parts of digestive tract: Secondary | ICD-10-CM | POA: Insufficient documentation

## 2017-09-07 LAB — BASIC METABOLIC PANEL
ANION GAP: 8 (ref 5–15)
BUN: 28 mg/dL — AB (ref 6–20)
CHLORIDE: 96 mmol/L — AB (ref 101–111)
CO2: 38 mmol/L — ABNORMAL HIGH (ref 22–32)
Calcium: 9.7 mg/dL (ref 8.9–10.3)
Creatinine, Ser: 1.02 mg/dL — ABNORMAL HIGH (ref 0.44–1.00)
GFR calc Af Amer: >60 mL/min (ref >60)
GFR calc non Af Amer: 54 mL/min — ABNORMAL LOW (ref >60)
GLUCOSE: 118 mg/dL — AB (ref 65–99)
POTASSIUM: 3.5 mmol/L (ref 3.5–5.1)
Sodium: 142 mmol/L (ref 135–145)

## 2017-09-07 LAB — CUP PACEART REMOTE DEVICE CHECK
Date Time Interrogation Session: 20181214151549
Implantable Lead Implant Date: 20140812
Implantable Lead Location: 753859
Implantable Lead Serial Number: 10491693
MDC IDC LEAD IMPLANT DT: 20140812
MDC IDC LEAD LOCATION: 753860
MDC IDC LEAD SERIAL: 29451060
MDC IDC PG IMPLANT DT: 20140812
Pulse Gen Model: 383562
Pulse Gen Serial Number: 60721404

## 2017-09-07 MED ORDER — LOSARTAN POTASSIUM 25 MG PO TABS: 12.5000 mg | ORAL_TABLET | Freq: Every evening | ORAL | 3 refills | Status: DC

## 2017-09-07 MED ORDER — LOSARTAN POTASSIUM 25 MG PO TABS: 25.0000 mg | ORAL_TABLET | Freq: Every day | ORAL | 3 refills | Status: DC

## 2017-09-07 MED ORDER — TORSEMIDE 20 MG PO TABS: 20.0000 mg | ORAL_TABLET | Freq: Every day | ORAL | 3 refills | Status: DC

## 2017-09-07 NOTE — Patient Instructions (Addendum)
Start Torsemide 20 mg (1 tab) daily  Losartan 12.5 mg (1/2 tab) every evening  Your physician has requested that you have a cardiac MRI. Cardiac MRI uses a computer to create images of your heart as its beating, producing both still and moving pictures of your heart and major blood vessels. For further information please visit [email or web address] Please follow the instruction sheet given to you today for more information.  Your physician recommends that you schedule a follow-up appointment in: 10-14 weeks with PA and lab work  Your physician recommends that you schedule a follow-up appointment in: 6 weeks with Dr. Aundra Dubin

## 2017-09-09 NOTE — Progress Notes (Signed)
PCP: Dr. Loistine Simas Cardiology: Dr. Geraldo Pitter HF Cardiology: Dr. Aundra Dubin  71 yo with history of PAD, COPD, carotid stenosis, and chronic systolic CHF was referred by Dr. Geraldo Pitter for evaluation/treatment of CHF.    Patient has a long history of vascular disease.  She had left-right fem-fem bypass in 3/16. She has moderate bilateral carotid stenosis.  Echo in 2/18 showed EF 20-25%.  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  She was admitted in 9/18 to the hospital in [facility] for about 4 days with acute on chronic systolic CHF.    RHC/LHC in 12/18 showed nonobstructive coronary disease, low filling pressures, and CI 2.0.   She returns for followup of CHF.  At last appointment, she was volume overloaded.  She was started on torsemide and Entresto. BP fell with [name] and she developed severe dizziness.  I had her stop Entresto and this resolved. I also had her decreased torsemide to 20 mg daily.  Currently, she has minimal lightheadedness with standing.  Breathing has improved, now no dyspnea walking on flat ground.  Weight down 8 lbs.  No orthopnea/PND.  No chest pain.    Labs (8/18): LDL 80, HDL 46 Labs (10/18): K 4.2, creatinine 1.1 Labs (11/18): digoxin 0.3 Labs (12/18): K 3.5, creatinine 1.02  PMH: 1. PAD: L=>R fem-fem bypass in 3/16.   - ABIs (3/18): Mildly abnormal, 0.81 left and 0.85 right.  2. H/o appendectomy 3. COPD: Quit smoking in 2/12.  4. Arthritis: Bilateral TKR 5. Diet-controlled diabetes 6. Hyperlipidemia 7. Carotid stenosis:  - 10/18 Carotid dopplers: 60-79% RICA stenosis, 60-45% LICA stenosis, left subclavian steal.  8. Chronic systolic CHF: Echo (4/09) with EF 20-25%, mild LV dilation, normal RV size and systolic function. NICM.  - LHC (10/17): Nonobstructive CAD.  - Biotronik ICD.  - LHC/RHC (12/18): 50% proximal PLOM (no obstructive disease). Mean RA 1, PA 32/10 mean 19, mean PCWP 7, CI 2.0.   SH: Lives with husband in [place], 3  daughters.  Quit smoking in 2012.    Family History  Problem Relation Age of Onset  . Heart disease Mother   . Hypertension Mother   Daughter with cardiomyopathy. Nephews with CAD.   ROS: All systems reviewed and negative except as per HPI.   Current Outpatient Medications  Medication Sig Dispense Refill  . acetaminophen (TYLENOL) 650 MG CR tablet Take 650 mg by mouth every 8 (eight) hours as needed for pain.    [name] albuterol (PROVENTIL HFA;VENTOLIN HFA) 108 (90 BASE) MCG/ACT inhaler Inhale 2 puffs into the lungs every 6 (six) hours as needed for wheezing or shortness of breath.    [name] aspirin EC 81 MG tablet Take 162 mg by mouth daily.    [name] atorvastatin (LIPITOR) 20 MG tablet Take 1 tablet (20 mg total) by mouth daily. 30 tablet 3  . carvedilol (COREG) 12.5 MG tablet Take 12.5 mg by mouth 2 (two) times daily with a meal.    . digoxin (LANOXIN) 0.125 MG tablet Take 0.0625 mg by mouth every Monday, Wednesday, and Friday.     . fluticasone furoate-vilanterol (BREO ELLIPTA) 200-25 MCG/INH AEPB Inhale 1 puff into the lungs daily.    [name] glimepiride (AMARYL) 2 MG tablet Take 2 mg by mouth daily.    . OXcarbazepine (TRILEPTAL) 150 MG tablet Take 150 mg by mouth 2 (two) times daily.    . pregabalin (LYRICA) 150 MG capsule Take 150 mg by mouth 2 (two) times daily.    [name] losartan (COZAAR)  25 MG tablet Take 0.5 tablets (12.5 mg total) by mouth every evening. 15 tablet 3  . sacubitril-valsartan (ENTRESTO) 24-26 MG Take 1 tablet by mouth 2 (two) times daily. (Patient not taking: Reported on 08/27/2017) 60 tablet 3  . torsemide (DEMADEX) 20 MG tablet Take 1 tablet (20 mg total) by mouth daily. 180 tablet 3   No current facility-administered medications for this encounter.    BP 124/66   Pulse 71   Wt 174 lb 12.8 oz (79.3 kg)   SpO2 98%   BMI 29.09 kg/m  General: NAD Neck: JVP 8 cm, no thyromegaly or thyroid nodule.  Lungs: Clear to auscultation bilaterally with normal respiratory effort. CV:  Nondisplaced PMI.  Heart regular S1/S2, no S3/S4, no murmur.  No peripheral edema.  No carotid bruit.  Normal pedal pulses.  Abdomen: Soft, nontender, no hepatosplenomegaly, no distention.  Skin: Intact without lesions or rashes.  Neurologic: Alert and oriented x 3.  Psych: Normal affect. Extremities: No clubbing or cyanosis.  HEENT: Normal.   Assessment/Plan: 1. Chronic systolic CHF: Nonischemic cardiomyopathy.  EF 20-25% on echo in 2/18.  LHC/RHC in 12/18 with low filling pressures, CI 2.0, and nonobstructive CAD. ?Prior viral myocarditis, ?hereditary cardShe has a Biotronik ICD. NYHA class II-III. Volume status much improved with 8 lb weight loss since last appointment.  - Continue torsemide 20 mg daily.   - Symptomatic hypotension with Entresto 24/26 bid so stopped. I will try her on losartan 12.5 mg daily today.  BMET today and again in 10 days.   - Continue current Coreg 12.5 mg bid. - Continue digoxin, recent level ok.   - Next step will be addition of spironolactone.  - I will arrange for cardiac MRI to assess for infiltrative disease.   - Given family history of cardiomyopathy, would be reasonable for 1st degree family members to get echoes .  2. PAD: s/p fem-fem bypass, followed by [facility].  No claudication, last ABIs mildly abnormal. 3. Carotid stenosis: Moderate bilateral stenosis on last dopplers, followed at [facility].  4. Hyperlipidemia: Recent increase in atorvastatin, repeat lipids next appt.   Followup with NP/PA in 2 wks and followup with me in 6 wks.   Lisa [name] 09/09/2017

## 2017-09-15 ENCOUNTER — Other Ambulatory Visit: Payer: Self-pay | Admitting: Cardiology

## 2017-09-17 ENCOUNTER — Telehealth (HOSPITAL_COMMUNITY): Payer: Self-pay | Admitting: *Deleted

## 2017-09-17 DIAGNOSIS — I5022 Chronic systolic (congestive) heart failure: Secondary | ICD-10-CM

## 2017-09-17 MED ORDER — POTASSIUM CHLORIDE ER 20 MEQ PO TBCR: 40.0000 meq | EXTENDED_RELEASE_TABLET | Freq: Every day | ORAL | 3 refills | Status: DC

## 2017-09-17 NOTE — Telephone Encounter (Signed)
Advanced Heart Failure Triage Encounter  Patient Name: Lisa [name]  Date of Call: 09/17/17  Problem: 7 lb wt gain/SOB  Patient called complaining of 7 lb weight gain since her last visit on 09/07/2017 with swelling around her eyes and BLE.  She is also complaining of increased sob.    Plan:  Spoke with Darrick Grinder, NP and she advises patient to take 40 mg of torsemide today and tomorrow and to have bmet/bnp drawn as scheduled on January 2nd. Also she needs to add 40 mEq of potassium daily.   Patient is aware of plan and will call us if symptoms become worse.   Darron Doom, RN

## 2017-09-19 ENCOUNTER — Telehealth (HOSPITAL_COMMUNITY): Payer: Self-pay | Admitting: *Deleted

## 2017-09-19 DIAGNOSIS — I5022 Chronic systolic (congestive) heart failure: Secondary | ICD-10-CM

## 2017-09-19 NOTE — Addendum Note (Signed)
Addended by: Harvie Junior on: 09/19/2017 03:53 PM   Modules accepted: Orders

## 2017-09-19 NOTE — Telephone Encounter (Signed)
[employer] RN to report pts weight of 184lbs. She is more short of breath with low urine output.Pt called on 12/24 her weight was 181lb we told patient to increase Torsemide to 40mg  x 2 day. Pts total weight gain since cath 12/04 is 15lbs. Per Jonni Sanger we need to get labs on her as soon as possible before making any medication changes. I spoke with patient and scheduled her for a lab appointment tomorrow morning.

## 2017-09-20 ENCOUNTER — Encounter (HOSPITAL_COMMUNITY): Payer: Self-pay

## 2017-09-20 ENCOUNTER — Inpatient Hospital Stay (HOSPITAL_COMMUNITY)
Admission: EM | Admit: 2017-09-20 | Discharge: 2017-09-22 | DRG: 292 | Disposition: A | Discharge status: Home / Self Care | Payer: Medicare Other | Attending: Internal Medicine | Admitting: Internal Medicine

## 2017-09-20 ENCOUNTER — Other Ambulatory Visit: Payer: Self-pay

## 2017-09-20 ENCOUNTER — Emergency Department (HOSPITAL_COMMUNITY): Payer: Medicare Other

## 2017-09-20 ENCOUNTER — Other Ambulatory Visit (HOSPITAL_COMMUNITY): Payer: Medicare Other

## 2017-09-20 DIAGNOSIS — Z95810 Presence of automatic (implantable) cardiac defibrillator: Secondary | ICD-10-CM

## 2017-09-20 DIAGNOSIS — R000 Tachycardia, unspecified: Secondary | ICD-10-CM

## 2017-09-20 DIAGNOSIS — J441 Chronic obstructive pulmonary disease with (acute) exacerbation: Secondary | ICD-10-CM | POA: Diagnosis present

## 2017-09-20 DIAGNOSIS — I428 Other cardiomyopathies: Secondary | ICD-10-CM

## 2017-09-20 DIAGNOSIS — E785 Hyperlipidemia, unspecified: Secondary | ICD-10-CM | POA: Diagnosis present

## 2017-09-20 DIAGNOSIS — J449 Chronic obstructive pulmonary disease, unspecified: Secondary | ICD-10-CM | POA: Diagnosis present

## 2017-09-20 DIAGNOSIS — I110 Hypertensive heart disease with heart failure: Principal | ICD-10-CM | POA: Diagnosis present

## 2017-09-20 DIAGNOSIS — I5023 Acute on chronic systolic (congestive) heart failure: Secondary | ICD-10-CM | POA: Diagnosis present

## 2017-09-20 DIAGNOSIS — Z87891 Personal history of nicotine dependence: Secondary | ICD-10-CM | POA: Diagnosis not present

## 2017-09-20 DIAGNOSIS — Z7982 Long term (current) use of aspirin: Secondary | ICD-10-CM

## 2017-09-20 DIAGNOSIS — Z79899 Other long term (current) drug therapy: Secondary | ICD-10-CM | POA: Diagnosis not present

## 2017-09-20 DIAGNOSIS — I340 Nonrheumatic mitral (valve) insufficiency: Secondary | ICD-10-CM

## 2017-09-20 DIAGNOSIS — E1151 Type 2 diabetes mellitus with diabetic peripheral angiopathy without gangrene: Secondary | ICD-10-CM | POA: Diagnosis present

## 2017-09-20 DIAGNOSIS — I10 Essential (primary) hypertension: Secondary | ICD-10-CM | POA: Diagnosis present

## 2017-09-20 DIAGNOSIS — Z888 Allergy status to other drugs, medicaments and biological substances status: Secondary | ICD-10-CM | POA: Diagnosis not present

## 2017-09-20 DIAGNOSIS — I472 Ventricular tachycardia: Secondary | ICD-10-CM | POA: Diagnosis present

## 2017-09-20 DIAGNOSIS — I2510 Atherosclerotic heart disease of native coronary artery without angina pectoris: Secondary | ICD-10-CM | POA: Diagnosis present

## 2017-09-20 DIAGNOSIS — K219 Gastro-esophageal reflux disease without esophagitis: Secondary | ICD-10-CM | POA: Diagnosis present

## 2017-09-20 DIAGNOSIS — I081 Rheumatic disorders of both mitral and tricuspid valves: Secondary | ICD-10-CM | POA: Diagnosis present

## 2017-09-20 DIAGNOSIS — I509 Heart failure, unspecified: Secondary | ICD-10-CM | POA: Diagnosis not present

## 2017-09-20 DIAGNOSIS — Z885 Allergy status to narcotic agent status: Secondary | ICD-10-CM | POA: Diagnosis not present

## 2017-09-20 DIAGNOSIS — I071 Rheumatic tricuspid insufficiency: Secondary | ICD-10-CM

## 2017-09-20 HISTORY — DX: Peripheral vascular disease, unspecified: I73.9

## 2017-09-20 HISTORY — DX: Personal history of nicotine dependence: Z87.891

## 2017-09-20 HISTORY — DX: Disorder of arteries and arterioles, unspecified: I77.9

## 2017-09-20 HISTORY — DX: Chronic systolic (congestive) heart failure: I50.22

## 2017-09-20 HISTORY — DX: Other transient cerebral ischemic attacks and related syndromes: G45.8

## 2017-09-20 HISTORY — DX: Atherosclerotic heart disease of native coronary artery without angina pectoris: I25.10

## 2017-09-20 LAB — BRAIN NATRIURETIC PEPTIDE: B Natriuretic Peptide: 954.4 pg/mL — ABNORMAL HIGH (ref 0.0–100.0)

## 2017-09-20 LAB — BASIC METABOLIC PANEL
Anion gap: 10 (ref 5–15)
BUN: 19 mg/dL (ref 6–20)
CHLORIDE: 101 mmol/L (ref 101–111)
CO2: 29 mmol/L (ref 22–32)
Calcium: 9.5 mg/dL (ref 8.9–10.3)
Creatinine, Ser: 0.86 mg/dL (ref 0.44–1.00)
GFR calc non Af Amer: >60 mL/min (ref >60)
Glucose, Bld: 106 mg/dL — ABNORMAL HIGH (ref 65–99)
POTASSIUM: 4.5 mmol/L (ref 3.5–5.1)
SODIUM: 140 mmol/L (ref 135–145)

## 2017-09-20 LAB — GLUCOSE, CAPILLARY
GLUCOSE-CAPILLARY: 125 mg/dL — AB (ref 65–99)
Glucose-Capillary: 73 mg/dL (ref 65–99)

## 2017-09-20 LAB — I-STAT TROPONIN, ED: Troponin i, poc: 0.01 ng/mL (ref 0.00–0.08)

## 2017-09-20 LAB — CBC
HEMATOCRIT: 44.4 % (ref 36.0–46.0)
Hemoglobin: 14.2 g/dL (ref 12.0–15.0)
MCH: 30.1 pg (ref 26.0–34.0)
MCHC: 32 g/dL (ref 30.0–36.0)
MCV: 94.3 fL (ref 78.0–100.0)
Platelets: 203 10*3/uL (ref 150–400)
RBC: 4.71 MIL/uL (ref 3.87–5.11)
RDW: 14 % (ref 11.5–15.5)
WBC: 4.8 10*3/uL (ref 4.0–10.5)

## 2017-09-20 LAB — MAGNESIUM: MAGNESIUM: 2.2 mg/dL (ref 1.7–2.4)

## 2017-09-20 LAB — DIGOXIN LEVEL

## 2017-09-20 MED ORDER — ACETAMINOPHEN ER 650 MG PO TBCR: 650.0000 mg | EXTENDED_RELEASE_TABLET | Freq: Three times a day (TID) | ORAL | Status: DC | PRN

## 2017-09-20 MED ORDER — POTASSIUM CHLORIDE ER 20 MEQ PO TBCR: 40.0000 meq | EXTENDED_RELEASE_TABLET | Freq: Every day | ORAL | Status: DC

## 2017-09-20 MED ORDER — SODIUM CHLORIDE 0.9 % IV SOLN: 250.0000 mL | INTRAVENOUS | Status: DC | PRN

## 2017-09-20 MED ORDER — CARVEDILOL 12.5 MG PO TABS
12.5000 mg | ORAL_TABLET | Freq: Two times a day (BID) | ORAL | Status: DC
  Administered 2017-09-20 – 2017-09-22 (×4): 12.5 mg via ORAL
  Filled 2017-09-20 (×4): qty 1

## 2017-09-20 MED ORDER — GLIMEPIRIDE 4 MG PO TABS
2.0000 mg | ORAL_TABLET | Freq: Every day | ORAL | Status: DC
  Administered 2017-09-21 – 2017-09-22 (×2): 2 mg via ORAL
  Filled 2017-09-20 (×2): qty 1

## 2017-09-20 MED ORDER — OXCARBAZEPINE 150 MG PO TABS
150.0000 mg | ORAL_TABLET | Freq: Two times a day (BID) | ORAL | Status: DC
  Administered 2017-09-20 – 2017-09-22 (×4): 150 mg via ORAL
  Filled 2017-09-20 (×4): qty 1

## 2017-09-20 MED ORDER — FUROSEMIDE 10 MG/ML IJ SOLN
60.0000 mg | Freq: Once | INTRAMUSCULAR | Status: AC
  Administered 2017-09-20: 60 mg via INTRAVENOUS
  Filled 2017-09-20: qty 6

## 2017-09-20 MED ORDER — ALBUTEROL SULFATE (2.5 MG/3ML) 0.083% IN NEBU
3.0000 mL | INHALATION_SOLUTION | RESPIRATORY_TRACT | Status: DC | PRN
  Administered 2017-09-20: 3 mL via RESPIRATORY_TRACT
  Filled 2017-09-20: qty 3

## 2017-09-20 MED ORDER — SODIUM CHLORIDE 0.9% FLUSH: 3.0000 mL | INTRAVENOUS | Status: DC | PRN

## 2017-09-20 MED ORDER — ASPIRIN EC 81 MG PO TBEC
162.0000 mg | DELAYED_RELEASE_TABLET | Freq: Every day | ORAL | Status: DC
  Administered 2017-09-20 – 2017-09-22 (×3): 162 mg via ORAL
  Filled 2017-09-20 (×3): qty 2

## 2017-09-20 MED ORDER — FUROSEMIDE 10 MG/ML IJ SOLN
80.0000 mg | Freq: Two times a day (BID) | INTRAMUSCULAR | Status: DC
  Administered 2017-09-20 – 2017-09-21 (×3): 80 mg via INTRAVENOUS
  Filled 2017-09-20 (×4): qty 8

## 2017-09-20 MED ORDER — ACETAMINOPHEN 325 MG PO TABS
650.0000 mg | ORAL_TABLET | Freq: Three times a day (TID) | ORAL | Status: DC | PRN
  Administered 2017-09-20: 650 mg via ORAL
  Filled 2017-09-20: qty 2

## 2017-09-20 MED ORDER — HEPARIN SODIUM (PORCINE) 5000 UNIT/ML IJ SOLN
5000.0000 [IU] | Freq: Three times a day (TID) | INTRAMUSCULAR | Status: DC
  Administered 2017-09-20 – 2017-09-22 (×5): 5000 [IU] via SUBCUTANEOUS
  Filled 2017-09-20 (×5): qty 1

## 2017-09-20 MED ORDER — SODIUM CHLORIDE 0.9% FLUSH
3.0000 mL | Freq: Two times a day (BID) | INTRAVENOUS | Status: DC
  Administered 2017-09-20 – 2017-09-22 (×4): 3 mL via INTRAVENOUS

## 2017-09-20 MED ORDER — PREGABALIN 75 MG PO CAPS
150.0000 mg | ORAL_CAPSULE | Freq: Two times a day (BID) | ORAL | Status: DC
  Administered 2017-09-20 – 2017-09-22 (×4): 150 mg via ORAL
  Filled 2017-09-20 (×4): qty 2

## 2017-09-20 MED ORDER — LOSARTAN POTASSIUM 25 MG PO TABS
12.5000 mg | ORAL_TABLET | Freq: Every evening | ORAL | Status: DC
  Administered 2017-09-20 – 2017-09-21 (×2): 12.5 mg via ORAL
  Filled 2017-09-20 (×2): qty 1

## 2017-09-20 MED ORDER — FLUTICASONE FUROATE-VILANTEROL 200-25 MCG/INH IN AEPB
1.0000 | INHALATION_SPRAY | Freq: Every day | RESPIRATORY_TRACT | Status: DC
  Administered 2017-09-21 – 2017-09-22 (×2): 1 via RESPIRATORY_TRACT
  Filled 2017-09-20: qty 28

## 2017-09-20 MED ORDER — INSULIN ASPART 100 UNIT/ML ~~LOC~~ SOLN: 0.0000 [IU] | Freq: Three times a day (TID) | SUBCUTANEOUS | Status: DC

## 2017-09-20 MED ORDER — ONDANSETRON HCL 4 MG/2ML IJ SOLN: 4.0000 mg | Freq: Four times a day (QID) | INTRAMUSCULAR | Status: DC | PRN

## 2017-09-20 MED ORDER — POTASSIUM CHLORIDE CRYS ER 20 MEQ PO TBCR
40.0000 meq | EXTENDED_RELEASE_TABLET | Freq: Every day | ORAL | Status: DC
  Administered 2017-09-21 – 2017-09-22 (×2): 40 meq via ORAL
  Filled 2017-09-20 (×2): qty 2

## 2017-09-20 MED ORDER — ATORVASTATIN CALCIUM 20 MG PO TABS
20.0000 mg | ORAL_TABLET | Freq: Every day | ORAL | Status: DC
  Administered 2017-09-20 – 2017-09-21 (×2): 20 mg via ORAL
  Filled 2017-09-20 (×2): qty 1

## 2017-09-20 MED ORDER — DIGOXIN 125 MCG PO TABS
0.0625 mg | ORAL_TABLET | ORAL | Status: DC
  Administered 2017-09-21: 0.0625 mg via ORAL
  Filled 2017-09-20 (×2): qty 1

## 2017-09-20 MED ORDER — ACETAMINOPHEN 325 MG PO TABS
650.0000 mg | ORAL_TABLET | Freq: Once | ORAL | Status: AC
  Administered 2017-09-20: 650 mg via ORAL
  Filled 2017-09-20: qty 2

## 2017-09-20 NOTE — ED Notes (Signed)
Patient transported to X-ray 

## 2017-09-20 NOTE — H&P (Signed)
Cardiology History & Physical    Patient ID: Lisa [name] MRN: [id number], DOB: [birth year] Date of Encounter: 09/20/2017, 12:59 PM Primary Physician: [name], MD Primary Cardiologist: Previously Dr. Lennox Pippins; more recently Dr. Aundra Dubin (CHF)  Chief Complaint: shortness of breath and weight gain Reason for Admission: CHF Requesting MD: Dr. Lita Mains  HPI: Lisa [name] is a 71 y.o. female with history of NICM and chronic systolic CHF s/p Biotronik ICD 2014, moderate MR/TR, nonobstructive CAD (50% PLOM 08/2017), PAD s/p L-R fem-fem BPG 2016, COPD, former tobacco abuse, DM, hyperlipidemia, left subclavian steal, moderate bilateral carotid disease (60-79% 06/2017) who presented to [facility] with dyspnea and weight gain.   She was remotely followed by [facility] as well as Dr. Lennox Pippins for NICM. Implant date of ICD was 2014. Last echo 10/2016 showed EF 20-25%, moderate mitral and tricuspid regurgitation, RVSP 50mmHg. She was referred to Dr. Aundra Dubin in 07/2017 to assist with management of her HF. At that time she was taking metolazone on its own for unclear reasons, but was asked to start torsemide instead. [name] was also started but she did not tolerate this due to hypotension. LHC/RHC in 12/18 showed low filling pressures, CI 2.0, and nonobstructive CAD. Her CM has been felt possibly due to viral myocarditis versus hereditary component. When seen in followup 09/07/17 she was doing reasonably well with weight 174lb, which was down 8lb from prior visit. cMRI planned to assess infiltrative disease (not yet done). She was started on losartan 12.5mg  daily.  In general she is very cognizant of sodium intake but does admit to eating 4-cheese lasagna recently. Aside from that she did not partake in holiday food and did not eat the ham she cooked for everyone else. She sticks to 1500cc fluid intake daily. For the past week she has noticed progressive dyspnea, orthopnea, edema, abdominal  distention and weight gain from 174 up to home weight of 186 today (not yet weighed in ED). Home health had contacted office 12/24 and shew as advised to increase torsemide to 40mg  x 2 days then return to scheduled dose. She did not notice significant difference with this. Home health contacted the office yesterday and she was scheduled for repeat labs. However, symptoms persisted so she came the the ED. She received 60mg  IV Lasix and reports excellent UOP with 1L out so far. BNP 954, troponin neg, digoxin level <0.2, CBC wnl, Cr 0.86. CXR suggestive of increased vascular congestion/interstitial edema,  stable to mildly increased cardiac silhouette since October. She has noticed rare wheezing but no cough, fever, chills, chest pain or syncope.  Past Medical History:  Diagnosis Date  . AICD (automatic cardioverter/defibrillator) present 2014   Biotroniks  . Arthritis    knees with arthritis as well as has back, pt. remarks that her hands fall asleep when she is in the bed or in certain positions   . Bell's palsy   . Carotid artery disease ([place])    a. duplex - moderate bilateral carotid disease (60-79% 06/2017).  . Chronic systolic CHF (congestive heart failure) ([place])   . COPD (chronic obstructive pulmonary disease) ([place])   . Diabetes mellitus without complication (HCC)    borderline  . Former tobacco use   . GERD (gastroesophageal reflux disease)    uses otc for occas. heartburn   . Hyperlipidemia   . Hypertension   . Mild CAD    a. nonobstructive CAD (50% PLOM 08/2017).  . Neuropathy   . Non-ischemic cardiomyopathy ([place])   .  PAD (peripheral artery disease) ([place])    a.  s/p L-R fem-fem BPG 2016.  [name] Pneumonia    hosp. in [place]-2015  . Subclavian steal syndrome    a. h/o left subclavian steal.     Surgical History:  Past Surgical History:  Procedure Laterality Date  . ABDOMINAL AORTAGRAM N/A 11/27/2014   Procedure: ABDOMINAL Maxcine Ham;  Surgeon: Elam Dutch, MD;  Location: [facility]  CATH LAB;  Service: Cardiovascular;  Laterality: N/A;  . ABDOMINAL HYSTERECTOMY    . APPENDECTOMY    . CARDIAC CATHETERIZATION     02/20/13: Normal coronaries, moderate LV dysfunction, EF 35%. Medical RX ([facility])  . CARDIAC DEFIBRILLATOR PLACEMENT  Aug. 2014   dual chamber Biotronik ICD 05/06/13 ([facility], Dr. Minna Merritts)  . ENDARTERECTOMY FEMORAL Right 12/21/2014   Procedure: ENDARTERECTOMY FEMORAL;  Surgeon: Elam Dutch, MD;  Location: [place];  Service: Vascular;  Laterality: Right;  . FEMORAL-FEMORAL BYPASS GRAFT Bilateral 12/21/2014   Procedure: BYPASS GRAFT LEFT FEMORAL-RIGHT FEMORAL ARTERY;  Surgeon: Elam Dutch, MD;  Location: [place];  Service: Vascular;  Laterality: Bilateral;  . JOINT REPLACEMENT Bilateral    implants  . RIGHT/LEFT HEART CATH AND CORONARY ANGIOGRAPHY N/A 08/28/2017   Procedure: RIGHT/LEFT HEART CATH AND CORONARY ANGIOGRAPHY;  Surgeon: Larey Dresser, MD;  Location: [place] CV LAB;  Service: Cardiovascular;  Laterality: N/A;  . TUBAL LIGATION       Home Meds: Prior to Admission medications   Medication Sig Start Date End Date Taking? Authorizing Provider  acetaminophen (TYLENOL) 650 MG CR tablet Take 650 mg by mouth every 8 (eight) hours as needed for pain.   Yes [provider]  albuterol (PROVENTIL HFA;VENTOLIN HFA) 108 (90 BASE) MCG/ACT inhaler Inhale 2 puffs into the lungs every 6 (six) hours as needed for wheezing or shortness of breath.   Yes [provider]  albuterol (PROVENTIL) (2.5 MG/3ML) 0.083% nebulizer solution Take 3 mLs by nebulization every 4 (four) hours as needed for wheezing. 08/01/17  Yes [provider]  aspirin EC 81 MG tablet Take 162 mg by mouth daily.   Yes [provider]  atorvastatin (LIPITOR) 20 MG tablet Take 1 tablet (20 mg total) by mouth daily. 08/24/17  Yes Larey Dresser, MD  carvedilol (COREG) 12.5 MG tablet TAKE ONE TABLET BY MOUTH TWICE DAILY 09/17/17  Yes Revankar, Reita Cliche, MD  [place] 125 MCG  tablet TAKE 1/2 TABLET BY MOUTH ONCE DAILY ON MONDAYS, WEDNESDAYS, AND FRIDAYS 09/17/17  Yes Revankar, Reita Cliche, MD  fluticasone furoate-vilanterol (BREO ELLIPTA) 200-25 MCG/INH AEPB Inhale 1 puff into the lungs daily.   Yes [provider]  glimepiride (AMARYL) 2 MG tablet Take 2 mg by mouth daily. 07/11/17  Yes [provider]  losartan (COZAAR) 25 MG tablet Take 0.5 tablets (12.5 mg total) by mouth every evening. Patient taking differently: Take 25 mg by mouth every evening.  09/07/17 12/06/17 Yes Larey Dresser, MD  OXcarbazepine (TRILEPTAL) 150 MG tablet Take 150 mg by mouth 2 (two) times daily. 05/14/17  Yes [provider]  Potassium Chloride ER 20 MEQ TBCR Take 40 mEq by mouth daily. 09/17/17 12/16/17 Yes Clegg, Amy D, NP  pregabalin (LYRICA) 150 MG capsule Take 150 mg by mouth 2 (two) times daily.   Yes [provider]  torsemide (DEMADEX) 20 MG tablet Take 1 tablet (20 mg total) by mouth daily. 09/07/17  Yes Larey Dresser, MD  sacubitril-valsartan (ENTRESTO) 24-26 MG Take 1 tablet by mouth 2 (  two) times daily. Patient not taking: Reported on 08/27/2017 08/24/17   Larey Dresser, MD    Allergies:  Allergies  Allergen Reactions  . Oxycodone Shortness Of Breath  . Entresto [Sacubitril-Valsartan] Other (See Comments)    Impaired coordination---causes patient to drop many things, blood pressure bottomed out    Social History   Socioeconomic History  . Marital status: Married    Spouse name: Not on file  . Number of children: Not on file  . Years of education: Not on file  . Highest education level: Not on file  Social Needs  . Financial resource strain: Not on file  . Food insecurity - worry: Not on file  . Food insecurity - inability: Not on file  . Transportation needs - medical: Not on file  . Transportation needs - non-medical: Not on file  Occupational History  . Not on file  Tobacco Use  . Smoking status: Former Smoker    Types:  Cigarettes    Last attempt to quit: 11/14/2010    Years since quitting: 6.8  . Smokeless tobacco: Never Used  Substance and Sexual Activity  . Alcohol use: No    Alcohol/week: 0.0 oz  . Drug use: No  . Sexual activity: Not on file  Other Topics Concern  . Not on file  Social History Narrative  . Not on file     Family History  Problem Relation Age of Onset  . Heart disease Mother   . Hypertension Mother     Review of Systems: denies bleeding All other systems reviewed and are otherwise negative except as noted above.  Labs:   Lab Results  Component Value Date   WBC 4.8 09/20/2017   HGB 14.2 09/20/2017   HCT 44.4 09/20/2017   MCV 94.3 09/20/2017   PLT 203 09/20/2017    Recent Labs  Lab 09/20/17 0900  NA 140  K 4.5  CL 101  CO2 29  BUN 19  CREATININE 0.86  CALCIUM 9.5  GLUCOSE 106*   No results for input(s): CKTOTAL, CKMB, TROPONINI in the last 72 hours. No results found for: CHOL, HDL, LDLCALC, TRIG No results found for: DDIMER  Radiology/Studies:  Dg Chest 2 View  Result Date: 09/20/2017 CLINICAL DATA:  71 year old female with recent weight gain of 10 pounds in 2 weeks. Shortness of breath. Cardiomyopathy. EXAM: CHEST  2 VIEW COMPARISON:  07/04/2017 and earlier. FINDINGS: Stable left chest cardiac AICD. Stable to mildly increased cardiomegaly since October. Calcified aortic atherosclerosis. Other mediastinal contours are within normal limits. Visualized tracheal air column is within normal limits. Increased basilar predominant diffuse pulmonary interstitial opacity (compare to August and July radiographs). No pleural effusion identified. No consolidation. No acute osseous abnormality identified. Negative visible bowel gas pattern. IMPRESSION: 1. Increased pulmonary vascular congestion compatible with mild or developing interstitial edema. No pleural effusion. 2. Cardiomegaly with stable to mildly increased cardiac silhouette since October. Electronically Signed    By: Genevie Ann M.D.   On: 09/20/2017 09:21   Wt Readings from Last 3 Encounters:  09/07/17 174 lb 12.8 oz (79.3 kg)  08/28/17 171 lb (77.6 kg)  08/24/17 182 lb (82.6 kg)    EKG: NSR 78bpm, poor r wave progression with old septal infarct pattern, baseline wander, nonspecific ST-T changes  Physical Exam Blood pressure (!) 119/53, pulse 62, temperature 97.9 F (36.6 C), temperature source Oral, resp. rate (!) 22, SpO2 100 %. There is no height or weight on file to calculate BMI. General: Well  developed, well nourished elderly F, in no acute distress. Head: Normocephalic, atraumatic, sclera non-icteric, no xanthomas, nares are without discharge.  Neck: Negative for carotid bruits. JVD 8-9  Lungs: Diffusely diminished throughout, rare exp wheeze. Breathing is unlabored. Heart: RRR with S1 S2. No murmurs, rubs, or gallops appreciated. Abdomen: Soft, non-tender, non-distended with normoactive bowel sounds. No hepatomegaly. No rebound/guarding. No obvious abdominal masses. Msk:  Strength and tone appear normal for age. Extremities: No clubbing or cyanosis. 2+ pitting BLE edema up to knees.  Distal pedal pulses are 2+ and equal bilaterally. Neuro: Alert and oriented X 3. No focal deficit. No facial asymmetry. Moves all extremities spontaneously. Psych:  Responds to questions appropriately with a normal affect.    Assessment and Plan   1. Acute on chronic systolic CHF/NICM - precipitant of worsening HF not totally clear. Does admit to dietary indiscretion one occasion but in general sticks to unsalted food and 1500cc fluid restriction. She has shown good response to IV Lasix so far. Will admit, initiate 80mg  IV BID Lasix, follow weights I/O's, and continue current regimen otherwise. cMRI planned as OP to eval for infiltrative disease.  2. Moderate mitral regurgitation/tricuspid regurgitation 10/2016 - no significant murmurs on exam. Update echocardiogram.  3. Nonobstructive CAD - recent cath without  significant CAD.  no recent angina. Continue aspirin, statin, BB.  4. COPD - reviewed with MD. Continue current beta blocker therapy and hold off steroids for now. Continue home inhaler.  5. Wide-complex rhythm on telemetry - rate around 100 - reviewed with MD, question NSVT/AIVR versus intermittent pacing. K OK. Check Mg. Continue BB for now.  6. DM - continue glimepiride. Add PRN SSI and CBG checks.   Severity of Illness: The appropriate patient status for this patient is INPATIENT. Inpatient status is judged to be reasonable and necessary in order to provide the required intensity of service to ensure the patient's safety. The patient's presenting symptoms, physical exam findings, and initial radiographic and laboratory data in the context of their chronic comorbidities is felt to place them at high risk for further clinical deterioration. Furthermore, it is not anticipated that the patient will be medically stable for discharge from the hospital within 2 midnights of admission. The following factors support the patient status of inpatient.   " The patient's presenting symptoms include SOB, orthopnea, wt gain. " The worrisome physical exam findings include edema, decreased BS. " The initial radiographic and laboratory data are worrisome because of elevated BNP and interstitial edema. " The chronic co-morbidities include NICM, chronic systolic CHF, COPD.   * I certify that at the point of admission it is my clinical judgment that the patient will require inpatient hospital care spanning beyond 2 midnights from the point of admission due to high intensity of service, high risk for further deterioration and high frequency of surveillance required.*    For questions or updates, please contact [place] Please consult [email or web address] for contact info under Cardiology/STEMI.  Signed, Charlie Pitter, PA-C 09/20/2017, 12:59 PM   Patient seen and examined with Melina Copa, PA-C. We discussed  all aspects of the encounter. I agree with the assessment and plan as stated above.   71 y/o woman with chronic systolic HF due to NICM EF 20-25% by echo in 2/18. Recent RHC with normal filling pressures with marginal outputs (CI = 2.0). Now presents with worsening volume overload (weight up about 15 pounds) with diminished response to increasing oral diuretics. Has responded well to initial dose of IV  lasix in [place]. Will admit for further IV diuresis. Repeat echo. She is having some NSVT. Will check electrolytes. Keep K > 4.0 & Mg > 2.0.  Glori Bickers, MD  5:15 PM

## 2017-09-20 NOTE — ED Triage Notes (Signed)
Pt here from home reports increased shortness of breath. Pt sees Dr. Marigene Ehlers for CHF. Family reports she has gained 10 pounds in 2 weeks. Pt appears short of breath, accessory muscle use noted.

## 2017-09-20 NOTE — ED Notes (Signed)
ED Provider at bedside. 

## 2017-09-20 NOTE — ED Notes (Signed)
Pt using bedside commode, SOB with exertion, place pt on 2L [place] for comfort.

## 2017-09-20 NOTE — ED Provider Notes (Signed)
[facility] EMERGENCY DEPARTMENT Provider Note   CSN: [id number] Arrival date & time: 09/20/17  2542     History   Chief Complaint Chief Complaint  Patient presents with  . Shortness of Breath    HPI Lisa [name] is a 71 y.o. female.  HPI Patient presents with progressive shortness of breath over the last 2 weeks.  Dyspnea is worse with lying flat and any exertion.  She denies cough or chest pain.  She has had increase in her lower extremity edema.  No fever or chills.  Patient states she is been compliant with her medication.  States she has gained 10 pounds of the last 2 weeks. Past Medical History:  Diagnosis Date  . AICD (automatic cardioverter/defibrillator) present 2014   Biotroniks  . Arthritis    knees with arthritis as well as has back, pt. remarks that her hands fall asleep when she is in the bed or in certain positions   . Bell's palsy   . Carotid artery disease ([place])    a. duplex - moderate bilateral carotid disease (60-79% 06/2017).  . Chronic systolic CHF (congestive heart failure) ([place])   . COPD (chronic obstructive pulmonary disease) ([place])   . Diabetes mellitus without complication (HCC)    borderline  . Former tobacco use   . GERD (gastroesophageal reflux disease)    uses otc for occas. heartburn   . Hyperlipidemia   . Hypertension   . Mild CAD    a. nonobstructive CAD (50% PLOM 08/2017).  . Neuropathy   . Non-ischemic cardiomyopathy ([place])   . PAD (peripheral artery disease) ([place])    a.  s/p L-R fem-fem BPG 2016.  [name] Pneumonia    hosp. in [place]-2015  . Subclavian steal syndrome    a. h/o left subclavian steal.    Patient Active Problem List   Diagnosis Date Noted  . Acute on chronic systolic CHF (congestive heart failure) ([place]) 09/20/2017  . Mild CAD 09/20/2017  . COPD (chronic obstructive pulmonary disease) ([place]) 09/20/2017  . Wide-complex tachycardia ([place]) 09/20/2017  . Moderate mitral regurgitation 09/20/2017  .  Moderate tricuspid regurgitation 09/20/2017  . Acute on chronic combined systolic and diastolic CHF (congestive heart failure) ([place]) 04/09/2017  . Nonischemic cardiomyopathy ([place])  04/09/2017  . Dyslipidemia 04/09/2017  . ICD (implantable cardioverter-defibrillator) in place 04/09/2017  . Essential hypertension 04/09/2017  . Discoloration of skin of toe-Right 3rd Toe 12/31/2014  . Wound drainage-Right Groin 12/31/2014  . Right leg swelling 12/31/2014  . Pain in joint, ankle and foot 12/31/2014  . 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 Paresthesia of both hands 03/19/2014  . Numbness- Right foot 03/19/2014  . Right foot pain 03/19/2014  . Peripheral vascular disease, unspecified ([place]) 12/26/2012  . Occlusion and stenosis of carotid artery without mention of cerebral infarction 11/14/2012    Past Surgical History:  Procedure Laterality Date  . ABDOMINAL AORTAGRAM N/A 11/27/2014   Procedure: ABDOMINAL Maxcine Ham;  Surgeon: Elam Dutch, MD;  Location: [facility] CATH LAB;  Service: Cardiovascular;  Laterality: N/A;  . ABDOMINAL HYSTERECTOMY    . APPENDECTOMY    . CARDIAC CATHETERIZATION     02/20/13: Normal coronaries, moderate LV dysfunction, EF 35%. Medical RX ([facility])  . CARDIAC DEFIBRILLATOR PLACEMENT  Aug. 2014   dual chamber Biotronik ICD 05/06/13 ([facility], Dr. Minna Merritts)  . ENDARTERECTOMY FEMORAL Right 12/21/2014   Procedure: ENDARTERECTOMY FEMORAL;  Surgeon: Elam Dutch, MD;  Location: [facility];  Service:  Vascular;  Laterality: Right;  . FEMORAL-FEMORAL BYPASS GRAFT Bilateral 12/21/2014   Procedure: BYPASS GRAFT LEFT FEMORAL-RIGHT FEMORAL ARTERY;  Surgeon: Elam Dutch, MD;  Location: [facility];  Service: Vascular;  Laterality: Bilateral;  . JOINT REPLACEMENT Bilateral    implants  . RIGHT/LEFT HEART CATH AND CORONARY ANGIOGRAPHY N/A 08/28/2017   Procedure: RIGHT/LEFT HEART CATH AND CORONARY ANGIOGRAPHY;  Surgeon: Larey Dresser, MD;  Location:  [facility] CV LAB;  Service: Cardiovascular;  Laterality: N/A;  . TUBAL LIGATION      OB History    No data available       Home Medications    Prior to Admission medications   Medication Sig Start Date End Date Taking? Authorizing Provider  acetaminophen (TYLENOL) 650 MG CR tablet Take 650 mg by mouth every 8 (eight) hours as needed for pain.   Yes [provider]  albuterol (PROVENTIL HFA;VENTOLIN HFA) 108 (90 BASE) MCG/ACT inhaler Inhale 2 puffs into the lungs every 6 (six) hours as needed for wheezing or shortness of breath.   Yes [provider]  albuterol (PROVENTIL) (2.5 MG/3ML) 0.083% nebulizer solution Take 3 mLs by nebulization every 4 (four) hours as needed for wheezing. 08/01/17  Yes [provider]  aspirin EC 81 MG tablet Take 162 mg by mouth daily.   Yes [provider]  atorvastatin (LIPITOR) 20 MG tablet Take 1 tablet (20 mg total) by mouth daily. 08/24/17  Yes Larey Dresser, MD  carvedilol (COREG) 12.5 MG tablet TAKE ONE TABLET BY MOUTH TWICE DAILY 09/17/17  Yes Revankar, Reita Cliche, MD  [place] 125 MCG tablet TAKE 1/2 TABLET BY MOUTH ONCE DAILY ON MONDAYS, WEDNESDAYS, AND FRIDAYS 09/17/17  Yes Revankar, Reita Cliche, MD  fluticasone furoate-vilanterol (BREO ELLIPTA) 200-25 MCG/INH AEPB Inhale 1 puff into the lungs daily.   Yes [provider]  glimepiride (AMARYL) 2 MG tablet Take 2 mg by mouth daily. 07/11/17  Yes [provider]  losartan (COZAAR) 25 MG tablet Take 0.5 tablets (12.5 mg total) by mouth every evening. Patient taking differently: Take 25 mg by mouth every evening.  09/07/17 12/06/17 Yes Larey Dresser, MD  OXcarbazepine (TRILEPTAL) 150 MG tablet Take 150 mg by mouth 2 (two) times daily. 05/14/17  Yes [provider]  Potassium Chloride ER 20 MEQ TBCR Take 40 mEq by mouth daily. 09/17/17 12/16/17 Yes Clegg, Amy D, NP  pregabalin (LYRICA) 150 MG capsule Take 150 mg by mouth 2 (two) times daily.   Yes  [provider]  torsemide (DEMADEX) 20 MG tablet Take 1 tablet (20 mg total) by mouth daily. 09/07/17  Yes Larey Dresser, MD    Family History Family History  Problem Relation Age of Onset  . Heart disease Mother   . Hypertension Mother     Social History Social History   Tobacco Use  . Smoking status: Former Smoker    Types: Cigarettes    Last attempt to quit: 11/14/2010    Years since quitting: 6.8  . Smokeless tobacco: Never Used  Substance Use Topics  . Alcohol use: No    Alcohol/week: 0.0 oz  . Drug use: No     Allergies   Oxycodone and Entresto [sacubitril-valsartan]   Review of Systems Review of Systems  Constitutional: Positive for fatigue. Negative for chills and fever.  HENT: Negative for sinus pressure, sore throat and trouble swallowing.   Respiratory: Positive for shortness of breath. Negative for cough and wheezing.   Cardiovascular: Positive for  leg swelling. Negative for chest pain and palpitations.  Gastrointestinal: Negative for abdominal pain, constipation, diarrhea, nausea and vomiting.  Genitourinary: Negative for dysuria, flank pain and frequency.  Musculoskeletal: Negative for back pain, myalgias and neck pain.  Skin: Negative for rash and wound.  Neurological: Positive for weakness. Negative for dizziness, numbness and headaches.  All other systems reviewed and are negative.    Physical Exam Updated Vital Signs BP 129/80   Pulse 86   Temp 97.9 F (36.6 C) (Oral)   Resp (!) 22   SpO2 100%   Physical Exam  Constitutional: She is oriented to person, place, and time. She appears well-developed and well-nourished.  HENT:  Head: Normocephalic and atraumatic.  Mouth/Throat: Oropharynx is clear and moist. No oropharyngeal exudate.  Eyes: EOM are normal. Pupils are equal, round, and reactive to light.  Neck: Normal range of motion. Neck supple. No JVD present.  Cardiovascular: Normal rate and regular rhythm.  Pulmonary/Chest:  She has rales.  Patient sitting up in bed.  Increased respiratory effort.  Crackles bilateral bases.  Abdominal: Soft. Bowel sounds are normal. There is no tenderness. There is no rebound and no guarding.  Musculoskeletal: Normal range of motion. She exhibits edema. She exhibits no tenderness.  2+ bilateral lower extremity pitting edema.  No calf asymmetry or tenderness.  Neurological: She is alert and oriented to person, place, and time.  Moves all extremities without focal deficit.  Sensation fully intact.  Skin: Skin is warm and dry. Capillary refill takes less than 2 seconds. No rash noted. No erythema.  Psychiatric: She has a normal mood and affect. Her behavior is normal.  Nursing note and vitals reviewed.    ED Treatments / Results  Labs (all labs ordered are listed, but only abnormal results are displayed) Labs Reviewed  BASIC METABOLIC PANEL - Abnormal; Notable for the following components:      Result Value   Glucose, Bld 106 (*)    All other components within normal limits  BRAIN NATRIURETIC PEPTIDE - Abnormal; Notable for the following components:   B Natriuretic Peptide 954.4 (*)    All other components within normal limits  DIGOXIN LEVEL - Abnormal; Notable for the following components:   Digoxin Level <0.2 (*)    All other components within normal limits  CBC  MAGNESIUM  I-STAT TROPONIN, ED    EKG  EKG Interpretation  Date/Time:  Thursday September 20 2017 08:36:16 EST Ventricular Rate:  78 PR Interval:  330 QRS Duration: 104 QT Interval:  404 QTC Calculation: 460 R Axis:   -91 Text Interpretation:  Sinus rhythm with 1st degree A-V block Septal infarct , age undetermined Possible Lateral infarct , age undetermined ST & T wave abnormality, consider inferior ischemia Abnormal ECG Confirmed by Julianne Rice [phone]) on 09/20/2017 10:06:50 AM       Radiology Dg Chest 2 View  Result Date: 09/20/2017 CLINICAL DATA:  71 year old female with recent weight  gain of 10 pounds in 2 weeks. Shortness of breath. Cardiomyopathy. EXAM: CHEST  2 VIEW COMPARISON:  07/04/2017 and earlier. FINDINGS: Stable left chest cardiac AICD. Stable to mildly increased cardiomegaly since October. Calcified aortic atherosclerosis. Other mediastinal contours are within normal limits. Visualized tracheal air column is within normal limits. Increased basilar predominant diffuse pulmonary interstitial opacity (compare to August and July radiographs). No pleural effusion identified. No consolidation. No acute osseous abnormality identified. Negative visible bowel gas pattern. IMPRESSION: 1. Increased pulmonary vascular congestion compatible with mild or developing interstitial edema. No  pleural effusion. 2. Cardiomegaly with stable to mildly increased cardiac silhouette since October. Electronically Signed   By: Genevie Ann M.D.   On: 09/20/2017 09:21    Procedures Procedures (including critical care time)  Medications Ordered in ED Medications  furosemide (LASIX) injection 60 mg (60 mg Intravenous Given 09/20/17 1009)  acetaminophen (TYLENOL) tablet 650 mg (650 mg Oral Given 09/20/17 1322)     Initial Impression / Assessment and Plan / ED Course  I have reviewed the triage vital signs and the nursing notes.  Pertinent labs & imaging results that were available during my care of the patient were reviewed by me and considered in my medical decision making (see chart for details).     Will start on IV diuretics.  Patient's breathing decompensates will need BiPAP.  Plan for admission for diuresis. Discussed with cardiology who will see patient in the emergency department. Final Clinical Impressions(s) / ED Diagnoses   Final diagnoses:  Other congestive heart failure [facility])    ED Discharge Orders    None       Julianne Rice, MD 09/20/17 1434

## 2017-09-20 NOTE — ED Notes (Signed)
Heart healthy lunch tray ordered @1313 

## 2017-09-21 ENCOUNTER — Inpatient Hospital Stay (HOSPITAL_COMMUNITY): Payer: Medicare Other

## 2017-09-21 DIAGNOSIS — I340 Nonrheumatic mitral (valve) insufficiency: Secondary | ICD-10-CM

## 2017-09-21 DIAGNOSIS — I509 Heart failure, unspecified: Secondary | ICD-10-CM

## 2017-09-21 LAB — BASIC METABOLIC PANEL
Anion gap: 8 (ref 5–15)
BUN: 17 mg/dL (ref 6–20)
CHLORIDE: 101 mmol/L (ref 101–111)
CO2: 31 mmol/L (ref 22–32)
CREATININE: 0.99 mg/dL (ref 0.44–1.00)
Calcium: 9.1 mg/dL (ref 8.9–10.3)
GFR calc Af Amer: >60 mL/min (ref >60)
GFR calc non Af Amer: 56 mL/min — ABNORMAL LOW (ref >60)
GLUCOSE: 107 mg/dL — AB (ref 65–99)
POTASSIUM: 3.8 mmol/L (ref 3.5–5.1)
SODIUM: 140 mmol/L (ref 135–145)

## 2017-09-21 LAB — GLUCOSE, CAPILLARY
GLUCOSE-CAPILLARY: 103 mg/dL — AB (ref 65–99)
Glucose-Capillary: 93 mg/dL (ref 65–99)
Glucose-Capillary: 85 mg/dL (ref 65–99)
Glucose-Capillary: 167 mg/dL — ABNORMAL HIGH (ref 65–99)

## 2017-09-21 LAB — DIGOXIN LEVEL

## 2017-09-21 LAB — ECHOCARDIOGRAM COMPLETE
HEIGHTINCHES: 65 in
Weight: 2878.4 oz

## 2017-09-21 NOTE — Progress Notes (Signed)
Pt educated about safety and importance of bed alarm during the night however pt refuses to be on bed alarm. Will continue to round on patient.   Delina Kruczek, RN    

## 2017-09-21 NOTE — Evaluation (Signed)
Physical Therapy Evaluation Patient Details Name: Lisa [name] MRN: [id number] DOB: 12-20-1945 Today's Date: 09/21/2017   History of Present Illness  71 y/o woman with chronic systolic HF due to NICM EF 20-25% by echo in 2/18. Recent RHC with normal filling pressures with marginal outputs (CI = 2.0). Now presents with worsening volume overload (weight up about 15 pounds) with diminished response to increasing oral diuretics.  Clinical Impression  Pt admitted with above diagnosis. Pt currently with functional limitations due to the deficits listed below (see PT Problem List). At the time of PT eval pt was able to perform transfers and ambulation with gross min guard assist to supervision for safety. Pt reports she is near her baseline of function. Will continue to follow acutely, however do not anticipate pt will require additional PT follow up at d/c. Pt will benefit from skilled PT to increase their independence and safety with mobility to allow discharge to the venue listed below.       Follow Up Recommendations No PT follow up;Supervision for mobility/OOB    Equipment Recommendations  None recommended by PT    Recommendations for Other Services       Precautions / Restrictions Precautions Precautions: Fall Restrictions Weight Bearing Restrictions: No      Mobility  Bed Mobility               General bed mobility comments: Pt was sitting up in recliner upon PT arrival.   Transfers Overall transfer level: Needs assistance Equipment used: None Transfers: Sit to/from Stand Sit to Stand: Supervision         General transfer comment: No assist required; no unsteadiness noted.   Ambulation/Gait Ambulation/Gait assistance: Supervision;Min guard Ambulation Distance (Feet): 200 Feet Assistive device: Rolling walker (2 wheeled);None Gait Pattern/deviations: Step-through pattern;Decreased stride length;Trunk flexed Gait velocity: Decreased Gait velocity  interpretation: Below normal speed for age/gender General Gait Details: With RW, pt ambulating at a supervision level. Without an AD, at a min guard level for balance support and safety.   Stairs            Wheelchair Mobility    Modified Rankin (Stroke Patients Only)       Balance Overall balance assessment: Needs assistance Sitting-balance support: Feet supported;No upper extremity supported Sitting balance-Leahy Scale: Fair     Standing balance support: No upper extremity supported;During functional activity Standing balance-Leahy Scale: Poor Standing balance comment: Unsteady without UE support with dynamic activity                             Pertinent Vitals/Pain Pain Assessment: No/denies pain    Home Living Family/patient expects to be discharged to:: Private residence Living Arrangements: Spouse/significant other;Other relatives Available Help at Discharge: Family;Available 24 hours/day Type of Home: House Home Access: Stairs to enter Entrance Stairs-Rails: None Entrance Stairs-Number of Steps: 2 Home Layout: Two level;Able to live on main level with bedroom/bathroom Home Equipment: Gilford Rile - 2 wheels;Bedside commode;Cane - single point      Prior Function Level of Independence: Needs assistance         Comments: Pt reports nephew puts the walker in front of her when she walks but she doesn't want to use it. Reports several near falls. Apparently has difficulty putting socks and shoes on but she manages to do it on her own. Cooks for herself although is limited.      Hand Dominance   Dominant Hand: Right  Extremity/Trunk Assessment   Upper Extremity Assessment Upper Extremity Assessment: Defer to OT evaluation    Lower Extremity Assessment Lower Extremity Assessment: Overall WFL for tasks assessed(Noted bilateral edema)    Cervical / Trunk Assessment Cervical / Trunk Assessment: Other exceptions Cervical / Trunk Exceptions:  Significant forward head/rounded shoulder posture   Communication   Communication: No difficulties  Cognition Arousal/Alertness: Awake/alert Behavior During Therapy: WFL for tasks assessed/performed Overall Cognitive Status: Within Functional Limits for tasks assessed                                        General Comments      Exercises     Assessment/Plan    PT Assessment Patient needs continued PT services  PT Problem List Decreased strength;Decreased range of motion;Decreased activity tolerance;Decreased balance;Decreased mobility;Decreased knowledge of use of DME;Decreased safety awareness;Decreased knowledge of precautions;Cardiopulmonary status limiting activity       PT Treatment Interventions DME instruction;Gait training;Stair training;Functional mobility training;Therapeutic activities;Therapeutic exercise;Neuromuscular re-education;Patient/family education    PT Goals (Current goals can be found in the Care Plan section)  Acute Rehab PT Goals Patient Stated Goal: Home tomorrow PT Goal Formulation: With patient Time For Goal Achievement: 10/05/17 Potential to Achieve Goals: Good    Frequency Min 3X/week   Barriers to discharge        Co-evaluation               AM-PAC PT "6 Clicks" Daily Activity  Outcome Measure Difficulty turning over in bed (including adjusting bedclothes, sheets and blankets)?: None Difficulty moving from lying on back to sitting on the side of the bed? : A Little Difficulty sitting down on and standing up from a chair with arms (e.g., wheelchair, bedside commode, etc,.)?: A Little Help needed moving to and from a bed to chair (including a wheelchair)?: A Little Help needed walking in hospital room?: A Little Help needed climbing 3-5 steps with a railing? : A Little 6 Click Score: 19    End of Session Equipment Utilized During Treatment: Gait belt Activity Tolerance: Patient tolerated treatment well Patient  left: in chair;with call bell/phone within reach;with chair alarm set Nurse Communication: Mobility status PT Visit Diagnosis: Unsteadiness on feet (R26.81)    Time: 1610-9604 PT Time Calculation (min) (ACUTE ONLY): 17 min   Charges:   PT Evaluation $PT Eval Moderate Complexity: 1 Mod     PT G Codes:        Rolinda Roan, PT, DPT Acute Rehabilitation Services Pager: [phone]   Thelma Comp 09/21/2017, 1:23 PM

## 2017-09-21 NOTE — Progress Notes (Signed)
Advanced Heart Failure Rounding Note  Primary Cardiologist: Dr. Aundra Dubin   Subjective:    Feeling better. Great UOP noted overnight. Breathing much improved. Denies lightheadedness or dizziness. No SOB getting around room.   Negative 1.9 L and down 2 lbs. Creatinine stable.   Echo done this am. Pending results.  -> EF 20-25% RV ok. Moderate MR  S/p [facility] 08/28/17 with non-obstructive CAD and low filling pressures.   Objective:   Weight Range: 179 lb 14.4 oz (81.6 kg) Body mass index is 29.94 kg/m.   Vital Signs:   Temp:  [97.8 F (36.6 C)-99.1 F (37.3 C)] 99.1 F (37.3 C) (12/28 0641) Pulse Rate:  [54-93] 62 (12/28 0641) Resp:  [18-27] 18 (12/28 0558) BP: (105-147)/(53-93) 105/56 (12/28 0641) SpO2:  [92 %-100 %] 92 % (12/28 0641) Weight:  [179 lb 14.4 oz (81.6 kg)-183 lb 4.8 oz (83.1 kg)] 179 lb 14.4 oz (81.6 kg) (12/28 0544) Last BM Date: 09/20/17  Weight change: Filed Weights   09/20/17 1448 09/20/17 1817 09/21/17 0544  Weight: 183 lb 4.8 oz (83.1 kg) 181 lb 4.8 oz (82.2 kg) 179 lb 14.4 oz (81.6 kg)    Intake/Output:   Intake/Output Summary (Last 24 hours) at 09/21/2017 0834 Last data filed at 09/21/2017 0547 Gross per 24 hour  Intake 240 ml  Output 2200 ml  Net -1960 ml    Physical Exam    General:  Well appearing. No resp difficulty HEENT: Normal Neck: Supple. JVP ~8. Carotids 2+ bilat; no bruits. No lymphadenopathy or thyromegaly appreciated. Cor: PMI nondisplaced. Regular rate & rhythm. 2/6 MR  Lungs: Clear Abdomen: Soft but distended, nontender. No hepatosplenomegaly. No bruits or masses. Good bowel sounds. Extremities: No cyanosis, clubbing, or rash. 1+ BLE edema. Neuro: Alert & orientedx3, cranial nerves grossly intact. moves all 4 extremities w/o difficulty. Affect pleasant  Telemetry   NSR 60-70s, personally reviewed  EKG    No new tracings.   Labs    CBC Recent Labs    09/20/17 0900  WBC 4.8  HGB 14.2  HCT 44.4  MCV 94.3  PLT  416   Basic Metabolic Panel Recent Labs    09/20/17 0900 09/21/17 0712  NA 140 140  K 4.5 3.8  CL 101 101  CO2 29 31  GLUCOSE 106* 107*  BUN 19 17  CREATININE 0.86 0.99  CALCIUM 9.5 9.1  MG 2.2  --    Liver Function Tests No results for input(s): AST, ALT, ALKPHOS, BILITOT, PROT, ALBUMIN in the last 72 hours. No results for input(s): LIPASE, AMYLASE in the last 72 hours. Cardiac Enzymes No results for input(s): CKTOTAL, CKMB, CKMBINDEX, TROPONINI in the last 72 hours.  BNP: BNP (last 3 results) Recent Labs    09/20/17 0900  BNP 954.4*    ProBNP (last 3 results) No results for input(s): PROBNP in the last 8760 hours.   D-Dimer No results for input(s): DDIMER in the last 72 hours. Hemoglobin A1C No results for input(s): HGBA1C in the last 72 hours. Fasting Lipid Panel No results for input(s): CHOL, HDL, LDLCALC, TRIG, CHOLHDL, LDLDIRECT in the last 72 hours. Thyroid Function Tests No results for input(s): TSH, T4TOTAL, T3FREE, THYROIDAB in the last 72 hours.  Invalid input(s): FREET3  Other results:  Imaging   Dg Chest 2 View  Result Date: 09/20/2017 CLINICAL DATA:  71 year old female with recent weight gain of 10 pounds in 2 weeks. Shortness of breath. Cardiomyopathy. EXAM: CHEST  2 VIEW COMPARISON:  07/04/2017 and  earlier. FINDINGS: Stable left chest cardiac AICD. Stable to mildly increased cardiomegaly since October. Calcified aortic atherosclerosis. Other mediastinal contours are within normal limits. Visualized tracheal air column is within normal limits. Increased basilar predominant diffuse pulmonary interstitial opacity (compare to August and July radiographs). No pleural effusion identified. No consolidation. No acute osseous abnormality identified. Negative visible bowel gas pattern. IMPRESSION: 1. Increased pulmonary vascular congestion compatible with mild or developing interstitial edema. No pleural effusion. 2. Cardiomegaly with stable to mildly  increased cardiac silhouette since October. Electronically Signed   By: Genevie Ann M.D.   On: 09/20/2017 09:21     Medications:    Scheduled Medications: . aspirin EC  162 mg Oral Daily  . atorvastatin  20 mg Oral q1800  . carvedilol  12.5 mg Oral BID WC  . digoxin  0.0625 mg Oral Q M,W,F  . fluticasone furoate-vilanterol  1 puff Inhalation Daily  . furosemide  80 mg Intravenous BID  . glimepiride  2 mg Oral Q breakfast  . heparin  5,000 Units Subcutaneous Q8H  . insulin aspart  0-9 Units Subcutaneous TID WC  . losartan  12.5 mg Oral QPM  . OXcarbazepine  150 mg Oral BID  . potassium chloride  40 mEq Oral Daily  . pregabalin  150 mg Oral BID  . sodium chloride flush  3 mL Intravenous Q12H     Infusions: . sodium chloride       PRN Medications:  sodium chloride, acetaminophen, albuterol, ondansetron (ZOFRAN) IV, sodium chloride flush    Patient Profile   71 y/o woman with chronic systolic HF due to NICM EF 20-25% by echo in 2/18. Recent RHC with normal filling pressures with marginal outputs (CI = 2.0). Now presents with worsening volume overload (weight up about 15 pounds) with diminished response to increasing oral diuretics.  Assessment/Plan   1. Acute on chronic systolic CHF/NICM  - Volume status somewhat improved on lasix 80 mg IV BID. Continue for today. - Pt was taking torsemide 20 mg daily, had previously been on 40 mg alternating with 20 mg daily.  - Repeat Echo pending.  2. Moderate mitral regurgitation/tricuspid regurgitation 10/2016  - Echo pending.   3. Nonobstructive CAD  - Recent cath without significant CAD.   - No s/s of ischemia. Continue aspirin, statin, BB.  4. COPD  - Stable for now. No wheezing.  - Continue home meds.   5. Wide-complex rhythm on telemetry - rate around 100 - reviewed with MD, question NSVT/AIVR versus intermittent pacing.  - Follow electrolytes. K 3.8 and Mg 2.2.  - Continue BB.  6. DM  - Continue glimepiride.  -  SSI as needed.   Diuresing well. Likely home tomorrow.   Medication concerns reviewed with patient and pharmacy team. Barriers identified: None at this time.   Length of Stay: 1  Annamaria Helling  09/21/2017, 8:34 AM  Advanced Heart Failure Team Pager [phone] (M-F; 7a - 4p)  Please contact [facility] for night-coverage after hours (4p -7a ) and weekends on [email or web address]  Patient seen and examined with the above-signed Advanced Practice Provider and/or Housestaff. I personally reviewed laboratory data, imaging studies and relevant notes. I independently examined the patient and formulated the important aspects of the plan. I have edited the note to reflect any of my changes or salient points. I have personally discussed the plan with the patient and/or family.  She is much better with diuresis. Still a few pounds to go. Will continue IV  lasix one more day. Renal function stable.   Echo reviewed personally EF 20-25% moderate MR. RV ok.   Likely home tomorrow. On admit was pt was taking torsemide 20 mg daily, had previously been on 40 mg alternating with 20 mg daily. Will resume the 40/20 regimen on d/c. NSVT stable has ICD in place.  Unable to get cMRI due to ICD. Can consider PYP scan though not significant LVH on echo.   Glori Bickers, MD  11:04 AM

## 2017-09-21 NOTE — Care Management Note (Addendum)
Case Management Note  Patient Details  Name: Lisa [name] MRN: [id number] Date of Birth: Feb 03, 1946  Subjective/Objective:   Admitted with CHF            Action/Plan:  Prior to admission Pt lived at home with family.  PCP is [name]. Uses [facility].  Home DME: cane, bedside commode and scale.   Pt cooks for herself, In general sticks to unsalted food.   Pt denies inability to afford medications or Food.  Denies inability of transportation to go shopping or medical appointments.  NCM will continue to follow for discharge needs.       Expected Discharge Plan:  Home/Self Care  Discharge planning Services  CM Consult  Status of Service:  In process, will continue to follow  Kristen Cardinal, RN  Case Manager-orientation [phone] 09/21/2017, 10:44 AM

## 2017-09-21 NOTE — Research (Signed)
Research Encounter  [facility], study pamphlet, and Research Coordinator contact given to subject today to take home and review. She is still receiving IV Lasix and cannot enroll in [place] until transition to PO. Possible d/c 12/29.  Subject and family concerned about transportation as patient lives in [place].  Taxi service offered for transportation to and from research appointments.  Will follow up with subject as an out-patient next week.

## 2017-09-21 NOTE — Progress Notes (Signed)
  Echocardiogram 2D Echocardiogram has been performed.  Lisa [name] Lisa [name] 09/21/2017, 8:27 AM

## 2017-09-22 LAB — BASIC METABOLIC PANEL
ANION GAP: 8 (ref 5–15)
BUN: 20 mg/dL (ref 6–20)
CALCIUM: 8.9 mg/dL (ref 8.9–10.3)
CO2: 30 mmol/L (ref 22–32)
CREATININE: 1 mg/dL (ref 0.44–1.00)
Chloride: 103 mmol/L (ref 101–111)
GFR, EST NON AFRICAN AMERICAN: 56 mL/min — AB (ref >60)
Glucose, Bld: 89 mg/dL (ref 65–99)
Potassium: 3.8 mmol/L (ref 3.5–5.1)
Sodium: 141 mmol/L (ref 135–145)

## 2017-09-22 LAB — GLUCOSE, CAPILLARY
GLUCOSE-CAPILLARY: 108 mg/dL — AB (ref 65–99)
Glucose-Capillary: 102 mg/dL — ABNORMAL HIGH (ref 65–99)

## 2017-09-22 LAB — MAGNESIUM: Magnesium: 2 mg/dL (ref 1.7–2.4)

## 2017-09-22 MED ORDER — SPIRONOLACTONE 25 MG PO TABS: 12.5000 mg | ORAL_TABLET | Freq: Every day | ORAL | 6 refills | Status: DC

## 2017-09-22 MED ORDER — TORSEMIDE 20 MG PO TABS
40.0000 mg | ORAL_TABLET | Freq: Every day | ORAL | Status: DC
  Administered 2017-09-22: 40 mg via ORAL
  Filled 2017-09-22: qty 2

## 2017-09-22 MED ORDER — SPIRONOLACTONE 12.5 MG HALF TABLET
12.5000 mg | ORAL_TABLET | Freq: Every day | ORAL | Status: DC
  Administered 2017-09-22: 12.5 mg via ORAL
  Filled 2017-09-22 (×2): qty 1

## 2017-09-22 MED ORDER — LOSARTAN POTASSIUM 25 MG PO TABS: 25.0000 mg | ORAL_TABLET | Freq: Every evening | ORAL | Status: DC

## 2017-09-22 MED ORDER — TORSEMIDE 20 MG PO TABS
40.0000 mg | ORAL_TABLET | Freq: Every day | ORAL | 6 refills | Status: DC
Start: 2017-09-23 — End: 2017-09-26

## 2017-09-22 NOTE — Progress Notes (Addendum)
Patient ID: Lisa [name], female   DOB: 1945-12-05, 71 y.o.   MRN: [id number]     Advanced Heart Failure Rounding Note  Primary Cardiologist: Dr. Aundra Dubin   Subjective:    Feeling better overall, weight down about 4 lbs.  Good UOP yesterday.   Echo: EF 20-25% with mid to apical segmental severe LV hypertrophy.   S/p [facility] 08/28/17 with non-obstructive CAD and low filling pressures.   Objective:   Weight Range: 179 lb 4.8 oz (81.3 kg) Body mass index is 29.84 kg/m.   Vital Signs:   Temp:  [98 F (36.7 C)-98.9 F (37.2 C)] 98.5 F (36.9 C) (12/29 0807) Pulse Rate:  [56-68] 56 (12/29 0807) Resp:  [16-18] 16 (12/29 0807) BP: (96-111)/(46-63) 105/63 (12/29 0807) SpO2:  [93 %-98 %] 94 % (12/29 0807) Weight:  [179 lb 4.8 oz (81.3 kg)] 179 lb 4.8 oz (81.3 kg) (12/29 0634) Last BM Date: 09/20/17  Weight change: Filed Weights   09/20/17 1817 09/21/17 0544 09/22/17 0634  Weight: 181 lb 4.8 oz (82.2 kg) 179 lb 14.4 oz (81.6 kg) 179 lb 4.8 oz (81.3 kg)    Intake/Output:   Intake/Output Summary (Last 24 hours) at 09/22/2017 0837 Last data filed at 09/22/2017 0125 Gross per 24 hour  Intake 1000 ml  Output 2190 ml  Net -1190 ml    Physical Exam    General: NAD Neck: JVP 8 cm, no thyromegaly or thyroid nodule.  Lungs: Clear to auscultation bilaterally with normal respiratory effort. CV: Nondisplaced PMI.  Heart regular S1/S2, no S3/S4, no murmur.  1+ ankle edema.   Abdomen: Soft, nontender, no hepatosplenomegaly, no distention.  Skin: Intact without lesions or rashes.  Neurologic: Alert and oriented x 3.  Psych: Normal affect. Extremities: No clubbing or cyanosis.  HEENT: Normal.    Telemetry   NSR 60s, personally reviewed.   EKG    No new tracings.   Labs    CBC Recent Labs    09/20/17 0900  WBC 4.8  HGB 14.2  HCT 44.4  MCV 94.3  PLT 601   Basic Metabolic Panel Recent Labs    09/20/17 0900 09/21/17 0712 09/22/17 0452  NA 140 140 141  K 4.5 3.8  3.8  CL 101 101 103  CO2 29 31 30   GLUCOSE 106* 107* 89  BUN 19 17 20   CREATININE 0.86 0.99 1.00  CALCIUM 9.5 9.1 8.9  MG 2.2  --  2.0   Liver Function Tests No results for input(s): AST, ALT, ALKPHOS, BILITOT, PROT, ALBUMIN in the last 72 hours. No results for input(s): LIPASE, AMYLASE in the last 72 hours. Cardiac Enzymes No results for input(s): CKTOTAL, CKMB, CKMBINDEX, TROPONINI in the last 72 hours.  BNP: BNP (last 3 results) Recent Labs    09/20/17 0900  BNP 954.4*    ProBNP (last 3 results) No results for input(s): PROBNP in the last 8760 hours.   D-Dimer No results for input(s): DDIMER in the last 72 hours. Hemoglobin A1C No results for input(s): HGBA1C in the last 72 hours. Fasting Lipid Panel No results for input(s): CHOL, HDL, LDLCALC, TRIG, CHOLHDL, LDLDIRECT in the last 72 hours. Thyroid Function Tests No results for input(s): TSH, T4TOTAL, T3FREE, THYROIDAB in the last 72 hours.  Invalid input(s): FREET3  Other results:  Imaging   No results found.  Medications:    Scheduled Medications: . aspirin EC  162 mg Oral Daily  . atorvastatin  20 mg Oral q1800  . carvedilol  12.5  mg Oral BID WC  . digoxin  0.0625 mg Oral Q M,W,F  . fluticasone furoate-vilanterol  1 puff Inhalation Daily  . glimepiride  2 mg Oral Q breakfast  . heparin  5,000 Units Subcutaneous Q8H  . insulin aspart  0-9 Units Subcutaneous TID WC  . losartan  25 mg Oral QPM  . OXcarbazepine  150 mg Oral BID  . potassium chloride  40 mEq Oral Daily  . pregabalin  150 mg Oral BID  . sodium chloride flush  3 mL Intravenous Q12H  . spironolactone  12.5 mg Oral Daily  . torsemide  40 mg Oral Daily    Infusions: . sodium chloride      PRN Medications: sodium chloride, acetaminophen, albuterol, ondansetron (ZOFRAN) IV, sodium chloride flush    Patient Profile   71 y/o woman with chronic systolic HF due to NICM EF 20-25% by echo in 2/18. Recent RHC with normal filling pressures  with marginal outputs (CI = 2.0). Now presents with worsening volume overload (weight up about 15 pounds) with diminished response to increasing oral diuretics.  Assessment/Plan   1. Acute on chronic systolic CHF/NICM: [employer] ICD.  Volume status improved with IV Lasix, weight down 4 lbs in hospital.  - Transition to torsemide 40 mg daily for home.  - Continue current losartan, digoxin, Coreg.  - Add spironolactone 12.5 mg daily.  - Echo with mid-apical severe LVH per report.  Will need to review.  Will send SPEP/UPEP today for AL amyloidosis, will need PYP scan as outpatient if these are negative.  Unable to get cardiac MRI (not MRI compatible ICD).  2. Nonobstructive CAD: Recent cath without significant CAD.   - Continue aspirin, statin, BB. 3. COPD: Stable for now. No wheezing.  - Continue home meds.  4. Wide-complex rhythm on telemetry: rate around 100, question NSVT/AIVR versus intermittent pacing.  - Continue BB.  Will need followup in 10-14 days CHF clinic.  Meds for home: torsemide 40 mg daily, KCl 40 daily, spironolactone 12.5 daily, losartan 25 mg qhs, digoxin 0.0625 qod, ASA 81, atorvastatin 20, Coreg 12.5 bid.   Loralie Champagne, MD  8:37 AM  09/22/2017

## 2017-09-22 NOTE — Progress Notes (Signed)
Pt got discharged to home, discharge instructions provided and patient showed understanding to it, IV taken out,Telemonitor DC,pt left unit in wheelchair with all of the belongings accompanied with a family member (Daughter) 

## 2017-09-22 NOTE — Discharge Instructions (Signed)
Low salt diabetic diet  Weigh daily Call for 3 lb weight gain in a day or 5 lbs in a week. Call the heart failure clinic.  Call if any questions or problems.  We increased torsemide daily and spironolactone.

## 2017-09-22 NOTE — Discharge Summary (Signed)
Discharge Summary    Patient ID: Lisa [name],  MRN: [id number], DOB/AGE: Feb 04, 1946 71 y.o.  Admit date: 09/20/2017 Discharge date: 09/22/2017  Primary Care Provider: [name] Primary Cardiologist: Loralie Champagne, MD  Discharge Diagnoses    Principal Problem:   Acute on chronic systolic CHF (congestive heart failure) ([place]) Active Problems:   Nonischemic cardiomyopathy (HCC)    Dyslipidemia   Essential hypertension   Mild CAD   COPD (chronic obstructive pulmonary disease) (HCC)   Wide-complex tachycardia (HCC)   Moderate mitral regurgitation   Moderate tricuspid regurgitation   Acute on chronic systolic (congestive) heart failure (HCC)   Allergies Allergies  Allergen Reactions  . Oxycodone Shortness Of Breath  . Entresto [Sacubitril-Valsartan] Other (See Comments)    Impaired coordination---causes patient to drop many things, blood pressure bottomed out    Diagnostic Studies/Procedures    Echo 09/22/17  Study Conclusions  - Left ventricle: The cavity size was normal. There was mild   concentric hypertrophy. Systolic function was severely reduced.   The estimated ejection fraction was in the range of 20% to 25%.   Diffuse hypokinesis. Features are consistent with a pseudonormal   left ventricular filling pattern, with concomitant abnormal   relaxation and increased filling pressure (grade 2 diastolic   dysfunction). Doppler parameters are consistent with elevated   ventricular end-diastolic filling pressure. - Aortic valve: Trileaflet; mildly thickened, mildly calcified   leaflets. - Mitral valve: There was mild regurgitation directed centrally and   posteriorly. - Left atrium: The atrium was moderately dilated. - Right ventricle: Pacer wire or catheter noted in right ventricle. - Right atrium: The atrium was moderately dilated. Pacer wire or   catheter noted in right atrium. - Tricuspid valve: There was moderate regurgitation. - Pulmonary  arteries: Systolic pressure was moderately increased.   PA peak pressure: 42 mm Hg (S). - Inferior vena cava: The vessel was dilated. The respirophasic   diameter changes were blunted (< 50%), consistent with elevated   central venous pressure. - Pericardium, extracardiac: A trivial pericardial effusion was   identified. Features were not consistent with tamponade   physiology.  Impressions:  - LVEF 20-25% with diffuse hypokinesis. There is mild LVH in the   basal segments but severe in the mid and apical segments. A   cardiac MRI (if ICD compatible) or an echo with Definity   echocontrast is recommended to further evaluate for apical   hypertrophic vs non-compaction cardiomyopathy.  _____________   History of Present Illness     71 y.o. female with history of NICM and chronic systolic CHF s/p Biotronik ICD 2014, moderate MR/TR, nonobstructive CAD (50% PLOM 08/2017), PAD s/p L-R fem-fem BPG 2016, COPD, former tobacco abuse, DM, hyperlipidemia, left subclavian steal, moderate bilateral carotid disease (60-79% 06/2017) who presented to [facility] with dyspnea and weight gain 09/20/17.   She was remotely followed by [facility] as well as Dr. Lennox Pippins for NICM. Implant date of ICD was 2014. Last echo 10/2016 showed EF 20-25%, moderate mitral and tricuspid regurgitation, RVSP 5mmHg. She was referred to Dr. Aundra Dubin in 07/2017 to assist with management of her HF. At that time she was taking metolazone on its own for unclear reasons, but was asked to start torsemide instead. [name] was also started but she did not tolerate this due to hypotension. LHC/RHC in 12/18 showed low filling pressures, CI 2.0, and nonobstructive CAD. Her CM has been felt possibly due to viral myocarditis versus hereditary component. When seen in followup  09/07/17 she was doing reasonably well with weight 174lb, which was down 8lb from prior visit. cMRI planned to assess infiltrative disease (not yet done). She was started  on losartan 12.5mg  daily.  Pt had progressive dyspnea at home and torsemide increased to 40 mg for 2 days.  This did not seem to help and symptoms persisted so she came to ER  In ER She received 60mg  IV Lasix and reports excellent UOP with 1L out so far. BNP 954, troponin neg, digoxin level <0.2, CBC wnl, Cr 0.86. CXR suggestive of increased vascular congestion/interstitial edema,  stable to mildly increased cardiac silhouette since October. She has noticed rare wheezing but no cough, fever, chills, chest pain or syncope.  She was admitted for diuresing.   Hospital Course     Consultants: none   Pt was placed on lasix 80 mg IV BID and is now neg. 3350 and wt down from 181 to 179 lbs. Her symptoms improved and has been able to ambulate without problems.  Her DM has been stable.  She did have cath in first of Dec without significant CAD.  No angina.    She has been seen and evaluated by Dr. Aundra Dubin and found stable for discharge today.  She will need follow up in 10-14 days.  Will resume torsemide 40 mg daily, KCL, 40 meq daily, spironolactone 12.5 daily, losartan 25 mg qhs, digoxin 0.0625 qod, ASA 81, atorvastatin 20, Coreg 12.5 bid.   She did have wide complex rhythm on tele at 100 NSVT/AIVR VS intermittent pacing.  She is on BB.    Echo with mid-apical severe LVH per report.  Will need to review.  Will send SPEP/UPEP today for AL amyloidosis, will need PYP scan as outpatient if these are negative.  Unable to get cardiac MRI (not MRI compatible ICD).    _____________  Discharge Vitals Blood pressure 105/63, pulse (!) 56, temperature 98.5 F (36.9 C), temperature source Oral, resp. rate 16, height 5\' 5"  (1.651 m), weight 179 lb 4.8 oz (81.3 kg), SpO2 98 %.  Filed Weights   09/20/17 1817 09/21/17 0544 09/22/17 0634  Weight: 181 lb 4.8 oz (82.2 kg) 179 lb 14.4 oz (81.6 kg) 179 lb 4.8 oz (81.3 kg)    Labs & Radiologic Studies    CBC Recent Labs    09/20/17 0900  WBC 4.8  HGB 14.2  HCT  44.4  MCV 94.3  PLT 557   Basic Metabolic Panel Recent Labs    09/20/17 0900 09/21/17 0712 09/22/17 0452  NA 140 140 141  K 4.5 3.8 3.8  CL 101 101 103  CO2 29 31 30   GLUCOSE 106* 107* 89  BUN 19 17 20   CREATININE 0.86 0.99 1.00  CALCIUM 9.5 9.1 8.9  MG 2.2  --  2.0   Liver Function Tests No results for input(s): AST, ALT, ALKPHOS, BILITOT, PROT, ALBUMIN in the last 72 hours. No results for input(s): LIPASE, AMYLASE in the last 72 hours. Cardiac Enzymes No results for input(s): CKTOTAL, CKMB, CKMBINDEX, TROPONINI in the last 72 hours. BNP Invalid input(s): POCBNP D-Dimer No results for input(s): DDIMER in the last 72 hours. Hemoglobin A1C No results for input(s): HGBA1C in the last 72 hours. Fasting Lipid Panel No results for input(s): CHOL, HDL, LDLCALC, TRIG, CHOLHDL, LDLDIRECT in the last 72 hours. Thyroid Function Tests No results for input(s): TSH, T4TOTAL, T3FREE, THYROIDAB in the last 72 hours.  Invalid input(s): FREET3 _____________  Dg Chest 2 View  Result Date:  09/20/2017 CLINICAL DATA:  71 year old female with recent weight gain of 10 pounds in 2 weeks. Shortness of breath. Cardiomyopathy. EXAM: CHEST  2 VIEW COMPARISON:  07/04/2017 and earlier. FINDINGS: Stable left chest cardiac AICD. Stable to mildly increased cardiomegaly since October. Calcified aortic atherosclerosis. Other mediastinal contours are within normal limits. Visualized tracheal air column is within normal limits. Increased basilar predominant diffuse pulmonary interstitial opacity (compare to August and July radiographs). No pleural effusion identified. No consolidation. No acute osseous abnormality identified. Negative visible bowel gas pattern. IMPRESSION: 1. Increased pulmonary vascular congestion compatible with mild or developing interstitial edema. No pleural effusion. 2. Cardiomegaly with stable to mildly increased cardiac silhouette since October. Electronically Signed   By: Genevie Ann M.D.    On: 09/20/2017 09:21   Disposition   Pt is being discharged home today in good condition.  Follow-up Plans & Appointments    Follow-up Information    Larey Dresser, MD Follow up on 09/26/2017.   Specialty:  Cardiology Why:  at 1000 am for post hospital follow up.  Code for parking is 9000. Contact information: [address] [place] [address] [phone]           Low salt diabetic diet  Weigh daily Call for 3 lb weight gain in a day or 5 lbs in a week. Call the heart failure clinic.  Call if any questions or problems.  We increased torsemide daily and spironolactone.     Discharge Medications   Allergies as of 09/22/2017      Reactions   Oxycodone Shortness Of Breath   Entresto [sacubitril-valsartan] Other (See Comments)   Impaired coordination---causes patient to drop many things, blood pressure bottomed out      Medication List    TAKE these medications   acetaminophen 650 MG CR tablet Commonly known as:  TYLENOL Take 650 mg by mouth every 8 (eight) hours as needed for pain.   albuterol 108 (90 Base) MCG/ACT inhaler Commonly known as:  PROVENTIL HFA;VENTOLIN HFA Inhale 2 puffs into the lungs every 6 (six) hours as needed for wheezing or shortness of breath.   albuterol (2.5 MG/3ML) 0.083% nebulizer solution Commonly known as:  PROVENTIL Take 3 mLs by nebulization every 4 (four) hours as needed for wheezing.   aspirin EC 81 MG tablet Take 162 mg by mouth daily.   atorvastatin 20 MG tablet Commonly known as:  LIPITOR Take 1 tablet (20 mg total) by mouth daily.   BREO ELLIPTA 200-25 MCG/INH Aepb Generic drug:  fluticasone furoate-vilanterol Inhale 1 puff into the lungs daily.   carvedilol 12.5 MG tablet Commonly known as:  COREG TAKE ONE TABLET BY MOUTH TWICE DAILY   DIGOX 0.125 MG tablet Generic drug:  digoxin TAKE 1/2 TABLET BY MOUTH ONCE DAILY ON MONDAYS, WEDNESDAYS, AND FRIDAYS   glimepiride 2 MG tablet Commonly known  as:  AMARYL Take 2 mg by mouth daily.   losartan 25 MG tablet Commonly known as:  COZAAR Take 0.5 tablets (12.5 mg total) by mouth every evening. What changed:  how much to take   OXcarbazepine 150 MG tablet Commonly known as:  TRILEPTAL Take 150 mg by mouth 2 (two) times daily.   Potassium Chloride ER 20 MEQ Tbcr Take 40 mEq by mouth daily.   pregabalin 150 MG capsule Commonly known as:  LYRICA Take 150 mg by mouth 2 (two) times daily.   spironolactone 25 MG tablet Commonly known as:  ALDACTONE Take 0.5 tablets (12.5 mg  total) by mouth daily.   torsemide 20 MG tablet Commonly known as:  DEMADEX Take 2 tablets (40 mg total) by mouth daily. Start taking on:  09/23/2017 What changed:  how much to take          Outstanding Labs/Studies   BMP and BNP  Duration of Discharge Encounter   Greater than 30 minutes including physician time.  Signed, Cecilie Kicks NP 09/22/2017, 10:42 AM

## 2017-09-24 LAB — IMMUNOFIXATION, URINE

## 2017-09-26 ENCOUNTER — Telehealth (HOSPITAL_COMMUNITY): Payer: Self-pay

## 2017-09-26 ENCOUNTER — Encounter (HOSPITAL_COMMUNITY): Payer: Self-pay

## 2017-09-26 ENCOUNTER — Ambulatory Visit (HOSPITAL_COMMUNITY)
Admission: RE | Admit: 2017-09-26 | Discharge: 2017-09-26 | Disposition: A | Discharge status: Home / Self Care | Payer: Medicare Other | Source: Ambulatory Visit | Attending: Cardiology | Admitting: Cardiology

## 2017-09-26 VITALS — BP 130/86 | HR 60 | Wt 178.0 lb

## 2017-09-26 DIAGNOSIS — Z8249 Family history of ischemic heart disease and other diseases of the circulatory system: Secondary | ICD-10-CM | POA: Diagnosis not present

## 2017-09-26 DIAGNOSIS — Z7951 Long term (current) use of inhaled steroids: Secondary | ICD-10-CM | POA: Diagnosis not present

## 2017-09-26 DIAGNOSIS — Z96653 Presence of artificial knee joint, bilateral: Secondary | ICD-10-CM | POA: Diagnosis not present

## 2017-09-26 DIAGNOSIS — E119 Type 2 diabetes mellitus without complications: Secondary | ICD-10-CM | POA: Insufficient documentation

## 2017-09-26 DIAGNOSIS — I6523 Occlusion and stenosis of bilateral carotid arteries: Secondary | ICD-10-CM | POA: Insufficient documentation

## 2017-09-26 DIAGNOSIS — R42 Dizziness and giddiness: Secondary | ICD-10-CM | POA: Diagnosis not present

## 2017-09-26 DIAGNOSIS — Z87891 Personal history of nicotine dependence: Secondary | ICD-10-CM | POA: Insufficient documentation

## 2017-09-26 DIAGNOSIS — Z95810 Presence of automatic (implantable) cardiac defibrillator: Secondary | ICD-10-CM | POA: Insufficient documentation

## 2017-09-26 DIAGNOSIS — I5022 Chronic systolic (congestive) heart failure: Secondary | ICD-10-CM | POA: Diagnosis not present

## 2017-09-26 DIAGNOSIS — I428 Other cardiomyopathies: Secondary | ICD-10-CM | POA: Insufficient documentation

## 2017-09-26 DIAGNOSIS — E785 Hyperlipidemia, unspecified: Secondary | ICD-10-CM | POA: Insufficient documentation

## 2017-09-26 DIAGNOSIS — Z7982 Long term (current) use of aspirin: Secondary | ICD-10-CM | POA: Insufficient documentation

## 2017-09-26 DIAGNOSIS — J449 Chronic obstructive pulmonary disease, unspecified: Secondary | ICD-10-CM | POA: Diagnosis not present

## 2017-09-26 DIAGNOSIS — Z79899 Other long term (current) drug therapy: Secondary | ICD-10-CM | POA: Diagnosis not present

## 2017-09-26 DIAGNOSIS — Z7984 Long term (current) use of oral hypoglycemic drugs: Secondary | ICD-10-CM | POA: Insufficient documentation

## 2017-09-26 LAB — LIPID PANEL
Cholesterol: 138 mg/dL (ref 0–200)
HDL: 38 mg/dL — AB (ref >40)
LDL Cholesterol: 79 mg/dL (ref 0–99)
TRIGLYCERIDES: 107 mg/dL (ref <150)
Total CHOL/HDL Ratio: 3.6 RATIO
VLDL: 21 mg/dL (ref 0–40)

## 2017-09-26 LAB — BASIC METABOLIC PANEL
ANION GAP: 6 (ref 5–15)
BUN: 24 mg/dL — ABNORMAL HIGH (ref 6–20)
CALCIUM: 9.7 mg/dL (ref 8.9–10.3)
CO2: 32 mmol/L (ref 22–32)
Chloride: 98 mmol/L — ABNORMAL LOW (ref 101–111)
Creatinine, Ser: 1.13 mg/dL — ABNORMAL HIGH (ref 0.44–1.00)
GFR, EST AFRICAN AMERICAN: 56 mL/min — AB (ref >60)
GFR, EST NON AFRICAN AMERICAN: 48 mL/min — AB (ref >60)
Glucose, Bld: 109 mg/dL — ABNORMAL HIGH (ref 65–99)
POTASSIUM: 5.8 mmol/L — AB (ref 3.5–5.1)
SODIUM: 136 mmol/L (ref 135–145)

## 2017-09-26 LAB — BRAIN NATRIURETIC PEPTIDE: B NATRIURETIC PEPTIDE 5: 492.8 pg/mL — AB (ref 0.0–100.0)

## 2017-09-26 MED ORDER — TORSEMIDE 20 MG PO TABS: 20.0000 mg | ORAL_TABLET | Freq: Every day | ORAL | 6 refills | Status: DC

## 2017-09-26 NOTE — Telephone Encounter (Signed)
Result Notes for B Nat Peptide   Notes recorded by Effie Berkshire, RN on 09/26/2017 at 3:59 PM EST Patient aware and agreeable, repeated instructions back, lab scheduled Monday to repeat bmet ------  Notes recorded by Darrick Grinder D, NP on 09/26/2017 at 3:16 PM EST Stop potassium. Stop spiro. K elevated. Stop Mrs Deliah Boston. Repeat BMET next week.

## 2017-09-26 NOTE — Patient Instructions (Signed)
Routine lab work today. Will notify you of abnormal results, otherwise no news is good news!  HOLD Torsemide tomorrow (Thursdsay) only.  Resume Torsemide 20 mg (1 tab) once daily on Friday.  Will refer you to [facility] for home health nursing and physical therapy.  Follow up with Dr. Aundra Dubin in 3 weeks.  Take all medication as prescribed the day of your appointment. Bring all medications with you to your appointment.  Do the following things EVERYDAY: 1) Weigh yourself in the morning before breakfast. Write it down and keep it in a log. 2) Take your medicines as prescribed 3) Eat low salt foods-Limit salt (sodium) to 2000 mg per day.  4) Stay as active as you can everyday 5) Limit all fluids for the day to less than 2 liters

## 2017-09-26 NOTE — Progress Notes (Signed)
PCP: Dr. Loistine Simas Cardiology: Dr. Geraldo Pitter HF Cardiology: Dr. Aundra Dubin  72 yo with history of PAD, COPD, carotid stenosis, and chronic systolic CHF was referred by Dr. Geraldo Pitter for evaluation/treatment of CHF.    Patient has a long history of vascular disease.  She had left-right fem-fem bypass in 3/16. She has moderate bilateral carotid stenosis.  Echo in 2/18 showed EF 20-25%.  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  She was admitted in 9/18 to the hospital in [facility] for about 4 days with acute on chronic systolic CHF.    RHC/LHC in 12/18 showed nonobstructive coronary disease, low filling pressures, and CI 2.0.   Admitted 12/27 with volume overload. Diuresed with IV lasix. Weight went down 2 pounds. Discharged on torsemide 40 mg daily. Discharge weight 179 pounds.   Today she returns for post hospital follow up. Complaining of dizziness when standing. Unsteady standing up. Not moving around much in the house due to dizziness. Denies PND/Orthopnea. Weight at home trending down 178-->176 pounds. Using Mrs Deliah Boston daily. Appetite ok. Denies syncope. Taking all medications.   Biotronic - no a fib. No VT. Active about 7%   Labs (8/18): LDL 80, HDL 46 Labs (10/18): K 4.2, creatinine 1.1 Labs (11/18): digoxin 0.3 Labs (12/18): K 3.5, creatinine 1.02 Labs 09/22/17: UPEP  Bence Jonesones protein noted SPEP pending   PMH: 1. PAD: L=>R fem-fem bypass in 3/16.   - ABIs (3/18): Mildly abnormal, 0.81 left and 0.85 right.  2. H/o appendectomy 3. COPD: Quit smoking in 2/12.  4. Arthritis: Bilateral TKR 5. Diet-controlled diabetes 6. Hyperlipidemia 7. Carotid stenosis:  - 10/18 Carotid dopplers: 60-79% RICA stenosis, 25-42% LICA stenosis, left subclavian steal.  8. Chronic systolic CHF: Echo (7/06) with EF 20-25%, mild LV dilation, normal RV size and systolic function. NICM.  - LHC (10/17): Nonobstructive CAD.  - Biotronik ICD.  - LHC/RHC (12/18): 50% proximal  PLOM (no obstructive disease). Mean RA 1, PA 32/10 mean 19, mean PCWP 7, CI 2.0.   SH: Lives with husband in [place], 3 daughters.  Quit smoking in 2012.    Family History  Problem Relation Age of Onset  . Heart disease Mother   . Hypertension Mother   Daughter with cardiomyopathy. Nephews with CAD.   ROS: All systems reviewed and negative except as per HPI.   Current Outpatient Medications  Medication Sig Dispense Refill  . acetaminophen (TYLENOL) 650 MG CR tablet Take 650 mg by mouth every 8 (eight) hours as needed for pain.    [name] albuterol (PROVENTIL HFA;VENTOLIN HFA) 108 (90 BASE) MCG/ACT inhaler Inhale 2 puffs into the lungs every 6 (six) hours as needed for wheezing or shortness of breath.    [name] albuterol (PROVENTIL) (2.5 MG/3ML) 0.083% nebulizer solution Take 3 mLs by nebulization every 4 (four) hours as needed for wheezing.    [name] aspirin EC 81 MG tablet Take 162 mg by mouth daily.    [name] atorvastatin (LIPITOR) 20 MG tablet Take 1 tablet (20 mg total) by mouth daily. 30 tablet 3  . carvedilol (COREG) 12.5 MG tablet TAKE ONE TABLET BY MOUTH TWICE DAILY 180 tablet 1  . DIGOX 125 MCG tablet TAKE 1/2 TABLET BY MOUTH ONCE DAILY ON MONDAYS, WEDNESDAYS, AND FRIDAYS 27 tablet 6  . fluticasone furoate-vilanterol (BREO ELLIPTA) 200-25 MCG/INH AEPB Inhale 1 puff into the lungs daily.    [name] glimepiride (AMARYL) 2 MG tablet Take 2 mg by mouth daily.    [name] losartan (COZAAR) 25 MG  tablet Take 0.5 tablets (12.5 mg total) by mouth every evening. (Patient taking differently: Take 25 mg by mouth every evening. ) 15 tablet 3  . OXcarbazepine (TRILEPTAL) 150 MG tablet Take 150 mg by mouth 2 (two) times daily.    . Potassium Chloride ER 20 MEQ TBCR Take 40 mEq by mouth daily. 60 tablet 3  . pregabalin (LYRICA) 150 MG capsule Take 150 mg by mouth 2 (two) times daily.    [name] spironolactone (ALDACTONE) 25 MG tablet Take 0.5 tablets (12.5 mg total) by mouth daily. 15 tablet 6  . torsemide (DEMADEX) 20 MG tablet  Take 2 tablets (40 mg total) by mouth daily. 30 tablet 6   No current facility-administered medications for this encounter.    BP 130/86 (BP Location: Right Arm, Patient Position: Sitting, Cuff Size: Normal)   Pulse 60   Wt 178 lb (80.7 kg)   SpO2 98%   BMI 29.62 kg/m   General:  Appears chronically ill. No resp difficulty. Arrived in a wheel chair.  HEENT: normal Neck: supple. JVP 5-6. Carotids 2+ bilat; no bruits. No lymphadenopathy or thryomegaly appreciated. Cor: PMI nondisplaced. Regular rate & rhythm. No rubs, gallops or murmurs. Lungs: clear Abdomen: soft, nontender, nondistended. No hepatosplenomegaly. No bruits or masses. Good bowel sounds. Extremities: no cyanosis, clubbing, rash, edema Neuro: alert & orientedx3, cranial nerves grossly intact. moves all 4 extremities w/o difficulty. Affect pleasant   Assessment/Plan: 1. Chronic systolic CHF: Nonischemic cardiomyopathy.  EF 20-25% on echo in 2/18.  LHC/RHC in 12/18 with low filling pressures, CI 2.0, and nonobstructive CAD. ?Prior viral myocarditis, ?hereditary card.  She has a Biotronik ICD UPEP with Bence Protein noted. SPEP pending. If SPEP + will need fat pad biopsy. She is  Unable to obtain MRI with ICD.  NYHA III. Volume status appears low. Already had torsemide today. Hold torsemide tomorrow then start torsemide 20 mg daily.  - Continue torsemide 20 mg daily. May need to cut back potassium dose. Will adjust based on BMET results.  - Symptomatic hypotension with Entresto 24/26 bid so stopped. Continue  losartan 12.5 mg daily today.   - Continue current Coreg 12.5 mg bid. - Continue digoxin, recent level ok.  If SPEP + will need to stop digoxin.  - Next step will be addition of spironolactone.  - I will arrange for cardiac MRI to assess for infiltrative disease.   - Given family history of cardiomyopathy, would be reasonable for 1st degree family members to get echoes .  2. PAD: s/p fem-fem bypass, followed by [facility].  No  claudication, last ABIs mildly abnormal. 3. Carotid stenosis: Moderate bilateral stenosis on last dopplers, followed at [facility].  4. Hyperlipidemia: Continue atorvastatin. Check lipids today   Refer to [facility] for HHRN HHPT.   Greater than 50% of the (total minutes 25) visit spent in counseling/coordination of care regarding testing, medication changes, and possible need for fat pad biopsy.      Follow up in 3 weeks with Dr Aundra Dubin.    Lisa Thieme NP-C  09/26/2017

## 2017-09-28 LAB — PROTEIN ELECTROPHORESIS, SERUM
A/G Ratio: 1 (ref 0.7–1.7)
ALBUMIN ELP: 3.4 g/dL (ref 2.9–4.4)
ALPHA-1-GLOBULIN: 0.2 g/dL (ref 0.0–0.4)
Alpha-2-Globulin: 0.7 g/dL (ref 0.4–1.0)
Beta Globulin: 1.4 g/dL — ABNORMAL HIGH (ref 0.7–1.3)
GLOBULIN, TOTAL: 3.5 g/dL (ref 2.2–3.9)
Gamma Globulin: 1.2 g/dL (ref 0.4–1.8)
TOTAL PROTEIN ELP: 6.9 g/dL (ref 6.0–8.5)

## 2017-10-01 ENCOUNTER — Ambulatory Visit (HOSPITAL_COMMUNITY)
Admission: RE | Admit: 2017-10-01 | Discharge: 2017-10-01 | Disposition: A | Discharge status: Home / Self Care | Payer: Medicare Other | Source: Ambulatory Visit | Attending: Cardiology | Admitting: Cardiology

## 2017-10-01 ENCOUNTER — Encounter: Payer: Self-pay | Admitting: *Deleted

## 2017-10-01 DIAGNOSIS — Z006 Encounter for examination for normal comparison and control in clinical research program: Secondary | ICD-10-CM

## 2017-10-01 DIAGNOSIS — I5022 Chronic systolic (congestive) heart failure: Secondary | ICD-10-CM | POA: Insufficient documentation

## 2017-10-01 LAB — BASIC METABOLIC PANEL
ANION GAP: 8 (ref 5–15)
BUN: 23 mg/dL — AB (ref 6–20)
CO2: 32 mmol/L (ref 22–32)
Calcium: 9.2 mg/dL (ref 8.9–10.3)
Chloride: 102 mmol/L (ref 101–111)
Creatinine, Ser: 1.18 mg/dL — ABNORMAL HIGH (ref 0.44–1.00)
GFR calc Af Amer: 53 mL/min — ABNORMAL LOW (ref >60)
GFR, EST NON AFRICAN AMERICAN: 46 mL/min — AB (ref >60)
GLUCOSE: 100 mg/dL — AB (ref 65–99)
POTASSIUM: 4.8 mmol/L (ref 3.5–5.1)
Sodium: 142 mmol/L (ref 135–145)

## 2017-10-01 NOTE — Progress Notes (Signed)
RESEARCH ENCOUNTER  Patient ID: JENASCIA BUMPASS  DOB: 09/19/1946  [name] meets inclusion/exclusion criteria for the [facility] HF research study.PI [place] aware.  Spoke with subject on the phone to follow up on recent hospitalization and discussion of screening for study.  Provided additional study information and answered questions/concerns. Subject states she wants to discuss with Dr. Aundra Dubin and will contact me if she wants to proceed.

## 2017-10-05 ENCOUNTER — Telehealth (HOSPITAL_COMMUNITY): Payer: Self-pay | Admitting: Cardiology

## 2017-10-05 NOTE — Telephone Encounter (Signed)
Opened in error

## 2017-10-08 ENCOUNTER — Other Ambulatory Visit (HOSPITAL_COMMUNITY): Payer: Self-pay

## 2017-10-08 DIAGNOSIS — E859 Amyloidosis, unspecified: Secondary | ICD-10-CM

## 2017-10-10 ENCOUNTER — Telehealth: Payer: Self-pay | Admitting: *Deleted

## 2017-10-10 NOTE — Telephone Encounter (Signed)
"  Received a call from someone from this office about an appointment.  How did you all know to call me?  What is this for?  Who will I see and where are you located?"    Provided referring provider's name.  Answered questions explaining she and Dr. Irene Limbo will discuss diagnosis along with any further needs or care options.  Informed.  Denies any further questions or needs at this time.

## 2017-10-11 NOTE — Progress Notes (Signed)
CONSULT NOTE  Patient Care Team: [name], MD as PCP - General (Internal Medicine) Larey Dresser, MD as PCP - Cardiology (Cardiology) Revankar, Reita Cliche, MD (Cardiology) [name], MD as Referring Physician (Internal Medicine) Gwenevere Ghazi, MD (Pulmonary Disease) Constance Haw, MD as Consulting Physician (Cardiology)  CHIEF COMPLAINTS/PURPOSE OF CONSULTATION:  R/o Amyloidosis  HISTORY OF PRESENTING ILLNESS:   Lisa [name] 72 y.o. female is here to r/o amyloidosis, and she was referred to [place] by Dr. Loistine Simas.   Pt presents to the office today accompanied by her sister. She reports that she is doing well overall. She reports that she is here to see if a particular protein is in her blood, after a recent immunofixation urine test which revealed positive Bence Jones protein; kappa type.   Patient has a h/o multiple medical co-morbidities including HTN , DM2 , Chronic systolic CHF, PAD, COPD who was on recent ECHO on 09/21/2017 was noted to have  LV EF of 20-25%.Reports that she's had heart problems for the last 5 years, and has had a defibrillator for the last 5 years. She wore a Holter monitor prior to defibrillator placement, but is not sure why she has the defibrillator.  Pt was in hospital during 09/20/17-09/22/17, for CHF. ECHO on 10/31/2016 also showed EF of 22%. Recent cath revealed that her cardiomyopathy is non ischemic. Recent immunofixation urine test which revealed positive Bence Jones protein; kappa type - referred due to concern for cardiac AL amyloidosis.  On 09/28/17 the lab Cr was at 0.98. On 09/22/17 a Protein Electrophoresis serum lab had Beta Globulin at 1.4, all others WNL. The pt will have an [place] fat pad biopsy next Friday(10/19/17).  No issues with renal dysfunction. For her PMHx the pt has had a Vit B12 deficiency but doesn't take shots since she left her last doctor a year ago.  Neuropathy in her feet for the last 4 years which she takes Lyrica for.  She has well controlled DM, and her fasting CBGs range from 67-133. The pt also has knee pain that she attributes to knee surgery in 2014 which she takes oxcarbazepine for. Denies Hx of MI, has had an angiogram that returned negative. The pt also uses a steroid inhaler.   On review of systems, pt reports chronic bilateral knee pain s/p bilateral knee replacement in 2014. She denies abdominal pain, GI issues, leg swelling and any other symptoms.    MEDICAL HISTORY:  Past Medical History:  Diagnosis Date  . AICD (automatic cardioverter/defibrillator) present 2014   Biotroniks  . Arthritis    knees with arthritis as well as has back, pt. remarks that her hands fall asleep when she is in the bed or in certain positions   . Bell's palsy   . Carotid artery disease ([place])    a. duplex - moderate bilateral carotid disease (60-79% 06/2017).  . Chronic systolic CHF (congestive heart failure) ([place])   . COPD (chronic obstructive pulmonary disease) ([place])   . Diabetes mellitus without complication (HCC)    borderline  . Former tobacco use   . GERD (gastroesophageal reflux disease)    uses otc for occas. heartburn   . Hyperlipidemia   . Hypertension   . Mild CAD    a. nonobstructive CAD (50% PLOM 08/2017).  . Neuropathy   . Non-ischemic cardiomyopathy ([place])   . PAD (peripheral artery disease) ([place])    a.  s/p L-R fem-fem BPG 2016.  [name] Pneumonia    hosp. in  [place]-2015  . Subclavian steal syndrome    a. h/o left subclavian steal.    SURGICAL HISTORY: Past Surgical History:  Procedure Laterality Date  . ABDOMINAL AORTAGRAM N/A 11/27/2014   Procedure: ABDOMINAL Maxcine Ham;  Surgeon: Elam Dutch, MD;  Location[facility] CATH LAB;  Service: Cardiovascular;  Laterality: N/A;  . ABDOMINAL HYSTERECTOMY    . APPENDECTOMY    . CARDIAC CATHETERIZATION     02/20/13: Normal coronaries, moderate LV dysfunction, EF 35%. Medical RX ([facility])  . CARDIAC DEFIBRILLATOR PLACEMENT  Aug. 2014   dual chamber  Biotronik ICD 05/06/13 ([facility], Dr. Minna Merritts)  . ENDARTERECTOMY FEMORAL Right 12/21/2014   Procedure: ENDARTERECTOMY FEMORAL;  Surgeon: Elam Dutch, MD;  Location: [place];  Service: Vascular;  Laterality: Right;  . FEMORAL-FEMORAL BYPASS GRAFT Bilateral 12/21/2014   Procedure: BYPASS GRAFT LEFT FEMORAL-RIGHT FEMORAL ARTERY;  Surgeon: Elam Dutch, MD;  Location: [place];  Service: Vascular;  Laterality: Bilateral;  . JOINT REPLACEMENT Bilateral    implants  . RIGHT/LEFT HEART CATH AND CORONARY ANGIOGRAPHY N/A 08/28/2017   Procedure: RIGHT/LEFT HEART CATH AND CORONARY ANGIOGRAPHY;  Surgeon: Larey Dresser, MD;  Location: [place] CV LAB;  Service: Cardiovascular;  Laterality: N/A;  . TUBAL LIGATION      SOCIAL HISTORY: Social History   Socioeconomic History  . Marital status: Married    Spouse name: Not on file  . Number of children: Not on file  . Years of education: Not on file  . Highest education level: Not on file  Social Needs  . Financial resource strain: Not on file  . Food insecurity - worry: Not on file  . Food insecurity - inability: Not on file  . Transportation needs - medical: Not on file  . Transportation needs - non-medical: Not on file  Occupational History  . Not on file  Tobacco Use  . Smoking status: Former Smoker    Types: Cigarettes    Last attempt to quit: 11/14/2010    Years since quitting: 6.9  . Smokeless tobacco: Never Used  Substance and Sexual Activity  . Alcohol use: No    Alcohol/week: 0.0 oz  . Drug use: No  . Sexual activity: Not on file  Other Topics Concern  . Not on file  Social History Narrative  . Not on file    FAMILY HISTORY: Family History  Problem Relation Age of Onset  . Heart disease Mother   . Hypertension Mother     ALLERGIES:  is allergic to oxycodone and entresto [sacubitril-valsartan].  MEDICATIONS:  Current Outpatient Medications  Medication Sig Dispense Refill  . acetaminophen (TYLENOL) 650 MG CR tablet Take  650 mg by mouth every 8 (eight) hours as needed for pain.    [name] albuterol (PROVENTIL HFA;VENTOLIN HFA) 108 (90 BASE) MCG/ACT inhaler Inhale 2 puffs into the lungs every 6 (six) hours as needed for wheezing or shortness of breath.    [name] albuterol (PROVENTIL) (2.5 MG/3ML) 0.083% nebulizer solution Take 3 mLs by nebulization every 4 (four) hours as needed for wheezing.    [name] aspirin EC 81 MG tablet Take 162 mg by mouth daily.    [name] atorvastatin (LIPITOR) 20 MG tablet Take 1 tablet (20 mg total) by mouth daily. 30 tablet 3  . carvedilol (COREG) 12.5 MG tablet TAKE ONE TABLET BY MOUTH TWICE DAILY 180 tablet 1  . DIGOX 125 MCG tablet TAKE 1/2 TABLET BY MOUTH ONCE DAILY ON MONDAYS, WEDNESDAYS, AND FRIDAYS 27 tablet 6  . fluticasone furoate-vilanterol (  BREO ELLIPTA) 200-25 MCG/INH AEPB Inhale 1 puff into the lungs daily.    [name] glimepiride (AMARYL) 2 MG tablet Take 2 mg by mouth daily.    [name] losartan (COZAAR) 25 MG tablet Take 0.5 tablets (12.5 mg total) by mouth every evening. (Patient taking differently: Take 25 mg by mouth every evening. ) 15 tablet 3  . OXcarbazepine (TRILEPTAL) 150 MG tablet Take 150 mg by mouth 2 (two) times daily.    . pregabalin (LYRICA) 150 MG capsule Take 150 mg by mouth 2 (two) times daily.    [name] torsemide (DEMADEX) 20 MG tablet Take 1 tablet (20 mg total) by mouth daily. 30 tablet 6  . nystatin (MYCOSTATIN) 100000 UNIT/ML suspension Take 5 mLs (500,000 Units total) by mouth 4 (four) times daily. Swish in the mouth for a couple of mins prior to swallowing. 200 mL 0   No current facility-administered medications for this visit.     REVIEW OF SYSTEMS:   Constitutional: Denies fevers, chills or abnormal night sweats Eyes: Denies blurriness of vision, double vision or watery eyes Ears, nose, mouth, throat, and face: Denies mucositis or sore throat Respiratory: Denies cough, dyspnea or wheezes Cardiovascular: Denies palpitation, chest discomfort or lower extremity  swelling Gastrointestinal:  Denies nausea, heartburn or change in bowel habits Skin: Denies abnormal skin rashes Lymphatics: Denies new lymphadenopathy or easy bruising Neurological:Denies numbness, tingling or new weaknesses Behavioral/Psych: Mood is stable, no new changes  All other systems were reviewed with the patient and are negative.  PHYSICAL EXAMINATION: ECOG PERFORMANCE STATUS:  Vitals:   10/12/17 1207  BP: 108/69  Pulse: 60  Resp: 20  Temp: 98.4 F (36.9 C)  SpO2: 97%   Filed Weights   10/12/17 1207  Weight: 180 lb 6.4 oz (81.8 kg)    GENERAL:alert, no distress and comfortable SKIN: skin color, texture, turgor are normal, no rashes or significant lesions EYES: normal, conjunctiva are pink and non-injected, sclera clear OROPHARYNX: thrush in back of throat, no exudate, no erythema and lips, buccal mucosa, and tongue normal  NECK: supple, thyroid normal size, non-tender, without nodularity LYMPH:  no palpable lymphadenopathy in the cervical, axillary or inguinal LUNGS: basal crackles with scattered expiratory rhonchi HEART: regular rate, with s3 sound and no murmursABDOMEN:abdomen soft, non-tender and normal bowel sounds Musculoskeletal: trace pedal edema PSYCH: alert & oriented x 3 with fluent speech NEURO: no focal motor/sensory deficits  LABORATORY DATA:   . CBC Latest Ref Rng & Units 10/12/2017 09/20/2017 08/28/2017  WBC 3.9 - 10.3 K/uL 5.0 4.8 6.1  Hemoglobin 12.0 - 15.0 g/dL - 14.2 17.3(H)  Hematocrit 34.8 - 46.6 % 42.0 44.4 51.8(H)  Platelets 145 - 400 K/uL 183 203 264   . CMP Latest Ref Rng & Units 10/12/2017 10/01/2017 09/26/2017  Glucose 70 - 140 mg/dL 74 100(H) 109(H)  BUN 7 - 26 mg/dL 18 23(H) 24(H)  Creatinine 0.44 - 1.00 mg/dL - 1.18(H) 1.13(H)  Sodium 136 - 145 mmol/L 144 142 136  Potassium 3.3 - 4.7 mmol/L 3.6 4.8 5.8(H)  Chloride 98 - 109 mmol/L 103 102 98(L)  CO2 22 - 29 mmol/L 31(H) 32 32  Calcium 8.4 - 10.4 mg/dL 9.7 9.2 9.7  Total Protein  6.4 - 8.3 g/dL 7.8 - -  Total Bilirubin 0.2 - 1.2 mg/dL 0.8 - -  Alkaline Phos 40 - 150 U/L 95 - -  AST 5 - 34 U/L 18 - -  ALT 0 - 55 U/L 18 - -   Component  Latest Ref Rng & Units 10/12/2017 10/12/2017         1:49 PM  2:11 PM  Retic Ct Pct     0.7 - 2.1 % 0.8 0.7  RBC.     3.70 - 5.45 MIL/uL 4.46 4.51  Retic Count, Absolute     33.7 - 90.7 K/uL 35.7 31.6 (L)  Beta-2 Microglobulin     0.6 - 2.4 mg/L 2.0   LDH     125 - 245 U/L 226           RADIOGRAPHIC STUDIES: I have personally reviewed the radiological images as listed and agreed with the findings in the report. Dg Chest 2 View  Result Date: 09/20/2017 CLINICAL DATA:  72 year old female with recent weight gain of 10 pounds in 2 weeks. Shortness of breath. Cardiomyopathy. EXAM: CHEST  2 VIEW COMPARISON:  07/04/2017 and earlier. FINDINGS: Stable left chest cardiac AICD. Stable to mildly increased cardiomegaly since October. Calcified aortic atherosclerosis. Other mediastinal contours are within normal limits. Visualized tracheal air column is within normal limits. Increased basilar predominant diffuse pulmonary interstitial opacity (compare to August and July radiographs). No pleural effusion identified. No consolidation. No acute osseous abnormality identified. Negative visible bowel gas pattern. IMPRESSION: 1. Increased pulmonary vascular congestion compatible with mild or developing interstitial edema. No pleural effusion. 2. Cardiomegaly with stable to mildly increased cardiac silhouette since October. Electronically Signed   By: Genevie Ann M.D.   On: 09/20/2017 09:21    ASSESSMENT & PLAN:   72 y.o. is a  female with PMHx of CHF with defibrillator placement, and DM  1. Non ischemic Cardiomyopathy - Chronic. Unclear etiology. R/o cardiac amyloidosis. Apparently had Kappa type bence jones proteins in a random urine. Plan -we discussed the goal of w/u was to evaluate for AL Amyloidosis. We discussed that there are other  type pf amyloidosis that would not be addressed currently. -SFLC ratio WNL -Myeloma panel shows no m spike 24h Urine UPEP -minimal urinary protein with no evidence of excessive Kappa or lambda light chains. -no overt  Evidence at this time of AL Amyloidosis. -patient has been scheduled for fat pad biopsy on 10/19/2017.   -Pt was informed of what impending labs and tests are looking for, and what amyloidosis could entail. -Advised blood tests and a 24 hour urine collection to detect abnormal proteins. -Need results from fat pad biopsy to determine what kind of abnormal proteins are present -May need to look at bone marrow exam for WBC  -Treatment on abnormal proteins to treat their deposition in the heart -Discussed with pt and her sister if AL amyloid, a bone marrow biopsy would need to be evaluated -will decide on need for BM Bx based on previous results. -if fat pad biopsy unrevealing cardiology team will need to determine need for transendocardial bx to r/o other potential types of Amyloidosis.  2. Oral thrush -likely from steroid inhalers. Plan Magic mouthwash -Prescribed Nystatin for thrush -Yogurt with cultures or buttermilk also recommended for thrush  -Pt to return in 2 weeks to review fat pad biopsy, blood work and 24 hour urine test and to discuss next steps.    All questions were answered. The patient knows to call the clinic with any problems, questions or concerns. I spent 45 minutes counseling the patient face to face. The total time spent in the appointment was 60 minutes and more than 50% was on counseling.    This document serves as a record of services personally performed  by Sullivan Lone, MD. It was created on his behalf by Baldwin Jamaica, a trained medical scribe. The creation of this record is based on the scribe's personal observations and the provider's statements to them.   .I have reviewed the above documentation for accuracy and completeness, and I agree with  the above. Brunetta Genera MD MS

## 2017-10-12 ENCOUNTER — Telehealth: Payer: Self-pay | Admitting: Hematology

## 2017-10-12 ENCOUNTER — Inpatient Hospital Stay: Payer: Medicare Other | Attending: Hematology | Admitting: Hematology

## 2017-10-12 ENCOUNTER — Inpatient Hospital Stay: Payer: Medicare Other

## 2017-10-12 ENCOUNTER — Encounter: Payer: Self-pay | Admitting: Hematology

## 2017-10-12 VITALS — BP 108/69 | HR 60 | Temp 98.4°F | Resp 20 | Ht 65.0 in | Wt 180.4 lb

## 2017-10-12 DIAGNOSIS — E859 Amyloidosis, unspecified: Secondary | ICD-10-CM

## 2017-10-12 DIAGNOSIS — D472 Monoclonal gammopathy: Secondary | ICD-10-CM

## 2017-10-12 DIAGNOSIS — I429 Cardiomyopathy, unspecified: Secondary | ICD-10-CM

## 2017-10-12 DIAGNOSIS — B370 Candidal stomatitis: Secondary | ICD-10-CM

## 2017-10-12 LAB — RETICULOCYTES
RBC.: 4.46 MIL/uL (ref 3.70–5.45)
RBC.: 4.51 MIL/uL (ref 3.70–5.45)
RETIC COUNT ABSOLUTE: 35.7 10*3/uL (ref 33.7–90.7)
Retic Count, Absolute: 31.6 10*3/uL — ABNORMAL LOW (ref 33.7–90.7)
Retic Ct Pct: 0.8 % (ref 0.7–2.1)
Retic Ct Pct: 0.7 % (ref 0.7–2.1)

## 2017-10-12 LAB — CBC WITH DIFFERENTIAL (CANCER CENTER ONLY)
BASOS ABS: 0 10*3/uL (ref 0.0–0.1)
Basophils Relative: 0 %
Eosinophils Absolute: 0.2 10*3/uL (ref 0.0–0.5)
Eosinophils Relative: 4 %
HCT: 42 % (ref 34.8–46.6)
Hemoglobin: 13.5 g/dL (ref 11.6–15.9)
LYMPHS PCT: 28 %
Lymphs Abs: 1.4 10*3/uL (ref 0.9–3.3)
MCH: 30.3 pg (ref 25.1–34.0)
MCHC: 32.1 g/dL (ref 31.5–36.0)
MCV: 94.2 fL (ref 79.5–101.0)
MONO ABS: 0.8 10*3/uL (ref 0.1–0.9)
MONOS PCT: 15 %
NEUTROS ABS: 2.7 10*3/uL (ref 1.5–6.5)
Neutrophils Relative %: 53 %
Platelet Count: 183 10*3/uL (ref 145–400)
RBC: 4.46 MIL/uL (ref 3.70–5.45)
RDW: 14.7 % (ref 11.2–16.1)
WBC Count: 5 10*3/uL (ref 3.9–10.3)

## 2017-10-12 LAB — CMP (CANCER CENTER ONLY)
ALBUMIN: 3.8 g/dL (ref 3.5–5.0)
ALT: 18 U/L (ref 0–55)
ANION GAP: 10 (ref 3–11)
AST: 18 U/L (ref 5–34)
Alkaline Phosphatase: 95 U/L (ref 40–150)
BUN: 18 mg/dL (ref 7–26)
CO2: 31 mmol/L — AB (ref 22–29)
Calcium: 9.7 mg/dL (ref 8.4–10.4)
Chloride: 103 mmol/L (ref 98–109)
Creatinine: 0.95 mg/dL (ref 0.60–1.10)
GFR, Est AFR Am: >60 mL/min (ref >60)
GFR, Estimated: 59 mL/min — ABNORMAL LOW (ref >60)
GLUCOSE: 74 mg/dL (ref 70–140)
POTASSIUM: 3.6 mmol/L (ref 3.3–4.7)
SODIUM: 144 mmol/L (ref 136–145)
TOTAL PROTEIN: 7.8 g/dL (ref 6.4–8.3)
Total Bilirubin: 0.8 mg/dL (ref 0.2–1.2)

## 2017-10-12 LAB — LACTATE DEHYDROGENASE: LDH: 226 U/L (ref 125–245)

## 2017-10-12 MED ORDER — NYSTATIN 100000 UNIT/ML MT SUSP: 5.0000 mL | Freq: Four times a day (QID) | OROMUCOSAL | 0 refills | Status: DC

## 2017-10-12 NOTE — Telephone Encounter (Signed)
Gave patient avs and calendar with appts p 1/18 los

## 2017-10-13 LAB — BETA 2 MICROGLOBULIN, SERUM: Beta-2 Microglobulin: 2 mg/L (ref 0.6–2.4)

## 2017-10-15 ENCOUNTER — Inpatient Hospital Stay: Payer: Medicare Other

## 2017-10-15 DIAGNOSIS — E859 Amyloidosis, unspecified: Secondary | ICD-10-CM | POA: Diagnosis not present

## 2017-10-15 LAB — KAPPA/LAMBDA LIGHT CHAINS
Kappa free light chain: 22.5 mg/L — ABNORMAL HIGH (ref 3.3–19.4)
Kappa, lambda light chain ratio: 1.01 (ref 0.26–1.65)
LAMDA FREE LIGHT CHAINS: 22.3 mg/L (ref 5.7–26.3)

## 2017-10-16 ENCOUNTER — Other Ambulatory Visit: Payer: Self-pay | Admitting: Student

## 2017-10-16 LAB — MULTIPLE MYELOMA PANEL, SERUM
Albumin SerPl Elph-Mcnc: 3.5 g/dL (ref 2.9–4.4)
Albumin/Glob SerPl: 1 (ref 0.7–1.7)
Alpha 1: 0.3 g/dL (ref 0.0–0.4)
Alpha2 Glob SerPl Elph-Mcnc: 0.8 g/dL (ref 0.4–1.0)
B-GLOBULIN SERPL ELPH-MCNC: 1.2 g/dL (ref 0.7–1.3)
GAMMA GLOB SERPL ELPH-MCNC: 1.4 g/dL (ref 0.4–1.8)
Globulin, Total: 3.7 g/dL (ref 2.2–3.9)
IGG (IMMUNOGLOBIN G), SERUM: 1325 mg/dL (ref 700–1600)
IgA: 508 mg/dL — ABNORMAL HIGH (ref 87–352)
IgM (Immunoglobulin M), Srm: 134 mg/dL (ref 26–217)
TOTAL PROTEIN ELP: 7.2 g/dL (ref 6.0–8.5)

## 2017-10-17 ENCOUNTER — Other Ambulatory Visit: Payer: Self-pay | Admitting: Radiology

## 2017-10-17 LAB — UIFE/LIGHT CHAINS/TP QN, 24-HR UR
% BETA, Urine: 0 %
ALPHA 1 URINE: 0 %
ALPHA 2 UR: 0 %
Albumin, U: 100 %
FREE LAMBDA LT CHAINS, UR: 3.42 mg/L (ref 0.24–6.66)
Free Kappa Lt Chains,Ur: 15.8 mg/L (ref 1.35–24.19)
Free Kappa/Lambda Ratio: 4.62 (ref 2.04–10.37)
GAMMA GLOBULIN URINE: 0 %
TOTAL PROTEIN, URINE-UR/DAY: 98 mg/(24.h) (ref 30–150)
Total Protein, Urine: 11.5 mg/dL
Total Volume: 850

## 2017-10-18 ENCOUNTER — Other Ambulatory Visit: Payer: Self-pay | Admitting: Radiology

## 2017-10-19 ENCOUNTER — Ambulatory Visit (HOSPITAL_COMMUNITY)
Admission: RE | Admit: 2017-10-19 | Discharge: 2017-10-19 | Disposition: A | Discharge status: Home / Self Care | Payer: Medicare Other | Source: Ambulatory Visit | Attending: Cardiology | Admitting: Cardiology

## 2017-10-19 ENCOUNTER — Encounter (HOSPITAL_COMMUNITY): Payer: Self-pay

## 2017-10-19 DIAGNOSIS — E859 Amyloidosis, unspecified: Secondary | ICD-10-CM | POA: Insufficient documentation

## 2017-10-19 LAB — CBC
HCT: 41.8 % (ref 36.0–46.0)
Hemoglobin: 13.6 g/dL (ref 12.0–15.0)
MCH: 30.2 pg (ref 26.0–34.0)
MCHC: 32.5 g/dL (ref 30.0–36.0)
MCV: 92.7 fL (ref 78.0–100.0)
PLATELETS: 227 10*3/uL (ref 150–400)
RBC: 4.51 MIL/uL (ref 3.87–5.11)
RDW: 14.9 % (ref 11.5–15.5)
WBC: 4.6 10*3/uL (ref 4.0–10.5)

## 2017-10-19 LAB — PROTIME-INR
INR: 1.11
Prothrombin Time: 14.2 seconds (ref 11.4–15.2)

## 2017-10-19 LAB — APTT: APTT: 32 s (ref 24–36)

## 2017-10-19 LAB — GLUCOSE, CAPILLARY: Glucose-Capillary: 98 mg/dL (ref 65–99)

## 2017-10-19 MED ORDER — LIDOCAINE HCL (PF) 1 % IJ SOLN
INTRAMUSCULAR | Status: AC
  Filled 2017-10-19: qty 30

## 2017-10-19 MED ORDER — MIDAZOLAM HCL 2 MG/2ML IJ SOLN
INTRAMUSCULAR | Status: AC | PRN
  Administered 2017-10-19 (×2): 1 mg via INTRAVENOUS

## 2017-10-19 MED ORDER — FENTANYL CITRATE (PF) 100 MCG/2ML IJ SOLN
INTRAMUSCULAR | Status: AC
  Filled 2017-10-19: qty 4

## 2017-10-19 MED ORDER — SODIUM CHLORIDE 0.9 % IV SOLN: INTRAVENOUS | Status: DC

## 2017-10-19 MED ORDER — FENTANYL CITRATE (PF) 100 MCG/2ML IJ SOLN
INTRAMUSCULAR | Status: AC | PRN
  Administered 2017-10-19: 50 ug via INTRAVENOUS

## 2017-10-19 MED ORDER — MIDAZOLAM HCL 2 MG/2ML IJ SOLN
INTRAMUSCULAR | Status: AC
  Filled 2017-10-19: qty 4

## 2017-10-19 NOTE — Discharge Instructions (Signed)
Needle Biopsy, Care After °These instructions give you information about caring for yourself after your procedure. Your doctor may also give you more specific instructions. Call your doctor if you have any problems or questions after your procedure. °Follow these instructions at home: °· Rest as told by your doctor. °· Take medicines only as told by your doctor. °· There are many different ways to close and cover the biopsy site, including stitches (sutures), skin glue, and adhesive strips. Follow instructions from your doctor about: °? How to take care of your biopsy site. °? When and how you should change your bandage (dressing). °? When you should remove your dressing. °? Removing whatever was used to close your biopsy site. °· Check your biopsy site every day for signs of infection. Watch for: °? Redness, swelling, or pain. °? Fluid, blood, or pus. °Contact a doctor if: °· You have a fever. °· You have redness, swelling, or pain at the biopsy site, and it lasts longer than a few days. °· You have fluid, blood, or pus coming from the biopsy site. °· You feel sick to your stomach (nauseous). °· You throw up (vomit). °Get help right away if: °· You are short of breath. °· You have trouble breathing. °· Your chest hurts. °· You feel dizzy or you pass out (faint). °· You have bleeding that does not stop with pressure or a bandage. °· You cough up blood. °· Your belly (abdomen) hurts. °This information is not intended to replace advice given to you by your health care provider. Make sure you discuss any questions you have with your health care provider. °Document Released: 08/24/2008 Document Revised: 02/17/2016 Document Reviewed: 09/07/2014 °Elsevier Interactive Patient Education © 2018 [employer] ° °

## 2017-10-19 NOTE — Procedures (Signed)
  Procedure: [place] abd fat pad core biopsy    EBL:   minimal Complications:  none immediate  See full dictation in [facility].  Dillard Cannon MD Main # [phone] Pager  [phone]

## 2017-10-19 NOTE — H&P (Signed)
Chief Complaint: Patient was seen in consultation today for fat pad biopsy at the request of Ashburn S  Referring Physician(s): [place] S  Supervising Physician: Arne Cleveland  Patient Status: [facility] - Out-pt  History of Present Illness: Lisa [name] is a 72 y.o. female   Darrick Grinder NP Note 09/26/17: Assessment/Plan: 1. Chronic systolic CHF: Nonischemic cardiomyopathy.  EF 20-25% on echo in 2/18.  LHC/RHC in 12/18 with low filling pressures, CI 2.0, and nonobstructive CAD. ?Prior viral myocarditis, ?hereditary card.  She has a Biotronik ICD UPEP with Bence Protein noted. SPEP pending. If SPEP + will need fat pad biopsy. She is  Unable to obtain MRI with ICD.  NYHA III. Volume status appears low. Already had torsemide today. Hold torsemide tomorrow then start torsemide 20 mg daily.  - Continue torsemide 20 mg daily. May need to cut back potassium dose. Will adjust based on BMET results.  - Symptomatic hypotension with Entresto 24/26 bid so stopped. Continue  losartan 12.5 mg daily today.   - Continue current Coreg 12.5 mg bid. - Continue digoxin, recent level ok.  If SPEP + will need to stop digoxin.  - Next step will be addition of spironolactone.  - I will arrange for cardiac MRI to assess for infiltrative disease.   - Given family history of cardiomyopathy, would be reasonable for 1st degree family members to get echoes .  2. PAD: s/p fem-fem bypass, followed by [facility].  No claudication, last ABIs mildly abnormal. 3. Carotid stenosis: Moderate bilateral stenosis on last dopplers, followed at [facility].  4. Hyperlipidemia: Continue atorvastatin.  Dr Irene Limbo note: 10/12/17: 72 y.o. is a  female with PMHx of CHF with defibrillator placement, and DM 1. Non ischemic Cardiomyopathy - Chronic. Unclear etiology. R/o cardiac amyloidosis. Apparently had Kappa type bence jones proteins in a random urine. Plan -we discussed the goal of w/u was to evaluate for AL Amyloidosis. We  discussed that there are other type pf amyloidosis that would not be addressed currently. -SFLC ratio WNL -Myeloma panel shows no m spike 24h Urine UPEP -minimal urinary protein with no evidence of excessive Kappa or lambda light chains. -no overt  Evidence at this time of AL Amyloidosis. -patient has been scheduled for fat pad biopsy on 10/19/2017.  Now scheduled for fat pad biopsy  Past Medical History:  Diagnosis Date  . AICD (automatic cardioverter/defibrillator) present 2014   Biotroniks  . Arthritis    knees with arthritis as well as has back, pt. remarks that her hands fall asleep when she is in the bed or in certain positions   . Bell's palsy   . Carotid artery disease ([place])    a. duplex - moderate bilateral carotid disease (60-79% 06/2017).  . Chronic systolic CHF (congestive heart failure) ([place])   . COPD (chronic obstructive pulmonary disease) ([place])   . Diabetes mellitus without complication (HCC)    borderline  . Former tobacco use   . GERD (gastroesophageal reflux disease)    uses otc for occas. heartburn   . Hyperlipidemia   . Hypertension   . Mild CAD    a. nonobstructive CAD (50% PLOM 08/2017).  . Neuropathy   . Non-ischemic cardiomyopathy ([place])   . PAD (peripheral artery disease) ([place])    a.  s/p L-R fem-fem BPG 2016.  [name] Pneumonia    hosp. in [place]-2015  . Subclavian steal syndrome    a. h/o left subclavian steal.    Past Surgical History:  Procedure Laterality Date  .  ABDOMINAL AORTAGRAM N/A 11/27/2014   Procedure: ABDOMINAL Maxcine Ham;  Surgeon: Elam Dutch, MD;  Location: [facility] CATH LAB;  Service: Cardiovascular;  Laterality: N/A;  . ABDOMINAL HYSTERECTOMY    . APPENDECTOMY    . CARDIAC CATHETERIZATION     02/20/13: Normal coronaries, moderate LV dysfunction, EF 35%. Medical RX ([facility])  . CARDIAC DEFIBRILLATOR PLACEMENT  Aug. 2014   dual chamber Biotronik ICD 05/06/13 ([facility], Dr. Minna Merritts)  . ENDARTERECTOMY FEMORAL Right 12/21/2014   Procedure:  ENDARTERECTOMY FEMORAL;  Surgeon: Elam Dutch, MD;  Location: [place];  Service: Vascular;  Laterality: Right;  . FEMORAL-FEMORAL BYPASS GRAFT Bilateral 12/21/2014   Procedure: BYPASS GRAFT LEFT FEMORAL-RIGHT FEMORAL ARTERY;  Surgeon: Elam Dutch, MD;  Location: [place];  Service: Vascular;  Laterality: Bilateral;  . JOINT REPLACEMENT Bilateral    implants  . RIGHT/LEFT HEART CATH AND CORONARY ANGIOGRAPHY N/A 08/28/2017   Procedure: RIGHT/LEFT HEART CATH AND CORONARY ANGIOGRAPHY;  Surgeon: Larey Dresser, MD;  Location: [place] CV LAB;  Service: Cardiovascular;  Laterality: N/A;  . TUBAL LIGATION      Allergies: Oxycodone and Entresto [sacubitril-valsartan]  Medications: Prior to Admission medications   Medication Sig Start Date End Date Taking? Authorizing Provider  albuterol (PROVENTIL HFA;VENTOLIN HFA) 108 (90 BASE) MCG/ACT inhaler Inhale 2 puffs into the lungs every 6 (six) hours as needed for wheezing or shortness of breath.   Yes [provider]  aspirin EC 81 MG tablet Take 162 mg by mouth daily.   Yes [provider]  atorvastatin (LIPITOR) 20 MG tablet Take 1 tablet (20 mg total) by mouth daily. 08/24/17  Yes Larey Dresser, MD  carvedilol (COREG) 12.5 MG tablet TAKE ONE TABLET BY MOUTH TWICE DAILY 09/17/17  Yes Revankar, Reita Cliche, MD  [place] 125 MCG tablet TAKE 1/2 TABLET BY MOUTH ONCE DAILY ON MONDAYS, WEDNESDAYS, AND FRIDAYS 09/17/17  Yes Revankar, Reita Cliche, MD  fluticasone furoate-vilanterol (BREO ELLIPTA) 200-25 MCG/INH AEPB Inhale 1 puff into the lungs daily.   Yes [provider]  glimepiride (AMARYL) 2 MG tablet Take 2 mg by mouth daily. 07/11/17  Yes [provider]  losartan (COZAAR) 25 MG tablet Take 0.5 tablets (12.5 mg total) by mouth every evening. Patient taking differently: Take 25 mg by mouth every evening.  09/07/17 12/06/17 Yes Larey Dresser, MD  OXcarbazepine (TRILEPTAL) 150 MG tablet Take 150 mg by mouth 2 (two)  times daily. 05/14/17  Yes [provider]  pregabalin (LYRICA) 150 MG capsule Take 150 mg by mouth 2 (two) times daily.   Yes [provider]  torsemide (DEMADEX) 20 MG tablet Take 1 tablet (20 mg total) by mouth daily. 09/26/17  Yes Clegg, Amy D, NP  acetaminophen (TYLENOL) 650 MG CR tablet Take 650 mg by mouth every 8 (eight) hours as needed for pain.    [provider]  albuterol (PROVENTIL) (2.5 MG/3ML) 0.083% nebulizer solution Take 3 mLs by nebulization every 4 (four) hours as needed for wheezing. 08/01/17   [provider]  nystatin (MYCOSTATIN) 100000 UNIT/ML suspension Take 5 mLs (500,000 Units total) by mouth 4 (four) times daily. Swish in the mouth for a couple of mins prior to swallowing. 10/12/17   Brunetta Genera, MD     Family History  Problem Relation Age of Onset  . Heart disease Mother   . Hypertension Mother     Social History   Socioeconomic History  . Marital status: Married    Spouse name: None  .  Number of children: None  . Years of education: None  . Highest education level: None  Social Needs  . Financial resource strain: None  . Food insecurity - worry: None  . Food insecurity - inability: None  . Transportation needs - medical: None  . Transportation needs - non-medical: None  Occupational History  . None  Tobacco Use  . Smoking status: Former Smoker    Types: Cigarettes    Last attempt to quit: 11/14/2010    Years since quitting: 6.9  . Smokeless tobacco: Never Used  Substance and Sexual Activity  . Alcohol use: No    Alcohol/week: 0.0 oz  . Drug use: No  . Sexual activity: None  Other Topics Concern  . None  Social History Narrative  . None    Review of Systems: A 12 point ROS discussed and pertinent positives are indicated in the HPI above.  All other systems are negative.  Review of Systems  Constitutional: Positive for fatigue. Negative for activity change and fever.  Cardiovascular: Negative for  chest pain.  Gastrointestinal: Negative for abdominal pain.  Neurological: Positive for weakness.  Psychiatric/Behavioral: Negative for behavioral problems and confusion.    Vital Signs: BP 135/83 (BP Location: Right Arm)   Pulse 74   Temp 97.9 F (36.6 C) (Oral)   Ht 5\' 5"  (1.651 m)   Wt 179 lb (81.2 kg)   SpO2 96%   BMI 29.79 kg/m   Physical Exam  Constitutional: She is oriented to person, place, and time.  Cardiovascular: Normal rate, regular rhythm and normal heart sounds.  Pulmonary/Chest: Effort normal and breath sounds normal.  Abdominal: Soft. Bowel sounds are normal.  Musculoskeletal: Normal range of motion.  Neurological: She is alert and oriented to person, place, and time.  Skin: Skin is warm and dry.  Psychiatric: She has a normal mood and affect. Her behavior is normal. Judgment and thought content normal.  Nursing note and vitals reviewed.   Imaging: Dg Chest 2 View  Result Date: 09/20/2017 CLINICAL DATA:  72 year old female with recent weight gain of 10 pounds in 2 weeks. Shortness of breath. Cardiomyopathy. EXAM: CHEST  2 VIEW COMPARISON:  07/04/2017 and earlier. FINDINGS: Stable left chest cardiac AICD. Stable to mildly increased cardiomegaly since October. Calcified aortic atherosclerosis. Other mediastinal contours are within normal limits. Visualized tracheal air column is within normal limits. Increased basilar predominant diffuse pulmonary interstitial opacity (compare to August and July radiographs). No pleural effusion identified. No consolidation. No acute osseous abnormality identified. Negative visible bowel gas pattern. IMPRESSION: 1. Increased pulmonary vascular congestion compatible with mild or developing interstitial edema. No pleural effusion. 2. Cardiomegaly with stable to mildly increased cardiac silhouette since October. Electronically Signed   By: Genevie Ann M.D.   On: 09/20/2017 09:21    Labs:  CBC: Recent Labs    08/28/17 1211  09/20/17 0900 10/12/17 1349  WBC 6.1 4.8 5.0  HGB 17.3* 14.2  --   HCT 51.8* 44.4 42.0  PLT 264 203 183    COAGS: Recent Labs    08/28/17 1211  INR 1.00    BMP: Recent Labs    09/21/17 0712 09/22/17 0452 09/26/17 1033 10/01/17 0748 10/12/17 1349  NA 140 141 136 142 144  K 3.8 3.8 5.8* 4.8 3.6  CL 101 103 98* 102 103  CO2 31 30 32 32 31*  GLUCOSE 107* 89 109* 100* 74  BUN 17 20 24* 23* 18  CALCIUM 9.1 8.9 9.7 9.2 9.7  CREATININE  0.99 1.00 1.13* 1.18*  --   GFRNONAA 56* 56* 48* 46* 59*  GFRAA >60 >60 56* 53* >60    LIVER FUNCTION TESTS: Recent Labs    08/24/17 1444 10/12/17 1349  BILITOT 0.8 0.8  AST 35 18  ALT 36 18  ALKPHOS 86 95  PROT 6.6 7.8  ALBUMIN 3.5 3.8    TUMOR MARKERS: No results for input(s): AFPTM, CEA, CA199, CHROMGRNA in the last 8760 hours.  Assessment and Plan:  Cardiomyopathy- chronic-- unknown etiology Bence Jones protien urine test-- concerning for possible amyloidosis Now scheduled for Fat Pad biopsy per Dr Aundra Dubin Risks and benefits discussed with the patient including, but not limited to bleeding, infection, damage to adjacent structures or low yield requiring additional tests. All of the patient's questions were answered, patient is agreeable to proceed. Consent signed and in chart.  Thank you for this interesting consult.  I greatly enjoyed meeting YUMNA EBERS and look forward to participating in their care.  A copy of this report was sent to the requesting provider on this date.  Electronically Signed: Lavonia Drafts, PA-C 10/19/2017, 12:03 PM   I spent a total of  30 Minutes   in face to face in clinical consultation, greater than 50% of which was counseling/coordinating care for Fat pad bx

## 2017-10-22 ENCOUNTER — Telehealth (HOSPITAL_COMMUNITY): Payer: Self-pay

## 2017-10-22 NOTE — Telephone Encounter (Signed)
Advanced Heart Failure Triage Encounter  Patient Name: Lisa [name]  Date of Call: 10/22/17  Problem:  Pt called b/c she has SOB w/exertion and edema. Pt. denies CP, cough, and wt gain.     Plan:  Per Amdy NP pt is to increase torsemide 40 mg BID for 2 days then decrease back to 20 mg torsemide daily. Start 20 meq daily. Bmet in 10 days  Pt did not answer, left VM     Shirley Muscat, RN

## 2017-10-23 NOTE — Telephone Encounter (Signed)
Spoke with patient today and she is feeling much better, does not feel the need to increase torsemide at this time.  She stated she will follow up tomorrow at her scheduled appointment.

## 2017-10-24 ENCOUNTER — Ambulatory Visit (HOSPITAL_COMMUNITY)
Admission: RE | Admit: 2017-10-24 | Discharge: 2017-10-24 | Disposition: A | Discharge status: Home / Self Care | Payer: Medicare Other | Source: Ambulatory Visit | Attending: Cardiology | Admitting: Cardiology

## 2017-10-24 ENCOUNTER — Encounter (HOSPITAL_COMMUNITY): Payer: Self-pay | Admitting: Cardiology

## 2017-10-24 VITALS — BP 95/67 | HR 73 | Wt 180.8 lb

## 2017-10-24 DIAGNOSIS — I6523 Occlusion and stenosis of bilateral carotid arteries: Secondary | ICD-10-CM | POA: Insufficient documentation

## 2017-10-24 DIAGNOSIS — E875 Hyperkalemia: Secondary | ICD-10-CM | POA: Insufficient documentation

## 2017-10-24 DIAGNOSIS — E785 Hyperlipidemia, unspecified: Secondary | ICD-10-CM | POA: Diagnosis not present

## 2017-10-24 DIAGNOSIS — I5022 Chronic systolic (congestive) heart failure: Secondary | ICD-10-CM

## 2017-10-24 DIAGNOSIS — Z7982 Long term (current) use of aspirin: Secondary | ICD-10-CM | POA: Insufficient documentation

## 2017-10-24 DIAGNOSIS — J449 Chronic obstructive pulmonary disease, unspecified: Secondary | ICD-10-CM | POA: Diagnosis not present

## 2017-10-24 DIAGNOSIS — Z87891 Personal history of nicotine dependence: Secondary | ICD-10-CM | POA: Diagnosis not present

## 2017-10-24 DIAGNOSIS — Z95810 Presence of automatic (implantable) cardiac defibrillator: Secondary | ICD-10-CM | POA: Insufficient documentation

## 2017-10-24 DIAGNOSIS — E859 Amyloidosis, unspecified: Secondary | ICD-10-CM | POA: Diagnosis not present

## 2017-10-24 DIAGNOSIS — D869 Sarcoidosis, unspecified: Secondary | ICD-10-CM

## 2017-10-24 DIAGNOSIS — Z7951 Long term (current) use of inhaled steroids: Secondary | ICD-10-CM | POA: Diagnosis not present

## 2017-10-24 DIAGNOSIS — Z79899 Other long term (current) drug therapy: Secondary | ICD-10-CM | POA: Diagnosis not present

## 2017-10-24 DIAGNOSIS — Z7984 Long term (current) use of oral hypoglycemic drugs: Secondary | ICD-10-CM | POA: Insufficient documentation

## 2017-10-24 DIAGNOSIS — E119 Type 2 diabetes mellitus without complications: Secondary | ICD-10-CM | POA: Insufficient documentation

## 2017-10-24 DIAGNOSIS — M1990 Unspecified osteoarthritis, unspecified site: Secondary | ICD-10-CM | POA: Diagnosis not present

## 2017-10-24 DIAGNOSIS — I429 Cardiomyopathy, unspecified: Secondary | ICD-10-CM | POA: Diagnosis not present

## 2017-10-24 DIAGNOSIS — Z96653 Presence of artificial knee joint, bilateral: Secondary | ICD-10-CM | POA: Diagnosis not present

## 2017-10-24 DIAGNOSIS — I2510 Atherosclerotic heart disease of native coronary artery without angina pectoris: Secondary | ICD-10-CM | POA: Insufficient documentation

## 2017-10-24 DIAGNOSIS — I739 Peripheral vascular disease, unspecified: Secondary | ICD-10-CM

## 2017-10-24 LAB — BASIC METABOLIC PANEL WITH GFR
Anion gap: 13 (ref 5–15)
BUN: 18 mg/dL (ref 6–20)
CO2: 28 mmol/L (ref 22–32)
Calcium: 8.9 mg/dL (ref 8.9–10.3)
Chloride: 99 mmol/L — ABNORMAL LOW (ref 101–111)
Creatinine, Ser: 0.94 mg/dL (ref 0.44–1.00)
GFR calc Af Amer: >60 mL/min
GFR calc non Af Amer: >60 mL/min
Glucose, Bld: 197 mg/dL — ABNORMAL HIGH (ref 65–99)
Potassium: 3.6 mmol/L (ref 3.5–5.1)
Sodium: 140 mmol/L (ref 135–145)

## 2017-10-24 LAB — DIGOXIN LEVEL: DIGOXIN LVL: 0.3 ng/mL — AB (ref 0.8–2.0)

## 2017-10-24 MED ORDER — TORSEMIDE 20 MG PO TABS: 40.0000 mg | ORAL_TABLET | Freq: Every day | ORAL | 6 refills | Status: DC

## 2017-10-24 MED ORDER — LOSARTAN POTASSIUM 25 MG PO TABS: 25.0000 mg | ORAL_TABLET | Freq: Every day | ORAL | 3 refills | Status: DC

## 2017-10-24 NOTE — Progress Notes (Signed)
PCP: Dr. Loistine Simas Cardiology: Dr. Geraldo Pitter HF Cardiology: Dr. Aundra Dubin  72 yo with history of PAD, COPD, carotid stenosis, and chronic systolic CHF was referred by Dr. Geraldo Pitter for evaluation/treatment of CHF.    Patient has a long history of vascular disease.  She had left-right fem-fem bypass in 3/16. She has moderate bilateral carotid stenosis.  Echo in 2/18 showed EF 20-25%.  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  She was admitted in 9/18 to the hospital in [facility] for about 4 days with acute on chronic systolic CHF.    RHC/LHC in 12/18 showed nonobstructive coronary disease, low filling pressures, and CI 2.0.   Admitted 09/20/17 with volume overload. Diuresed with IV lasix. Weight went down 2 pounds. Discharged on torsemide 40 mg daily. Discharge weight 179 pounds.   Echo in 12/18 with EF 20-25%, mild basal ventricular hypertrophy, severe mid to apical LV hypertrophy.    She was noted to have Bence-Jones protein on UPEP.  However, SPEP was negative and 24 hours UPEP was also negative. Abdominal fat pad biopsy did not show evidence of amyloidosis.  She saw heme-onc, probably does not have AL amyloidosis.   She returns for followup of CHF.  She is now off spironolactone due to hyperkalemia.  She is out of losartan. Weight is up 2 lbs.  She is short of breath after walking about 50 yards.  She does ok in her house. No chest pain or lightheadedness though BP is soft today.  She has generalized fatigue.  Last night, she noted orthopnea.  This is not usual for her.    A lump was recently found in her left breast and she will need a biopsy.   Labs (8/18): LDL 80, HDL 46 Labs (10/18): K 4.2, creatinine 1.1 Labs (11/18): digoxin 0.3 Labs (12/18): K 3.5, creatinine 1.02, UPEP with faint Bence-Jones protein but 24 hour UPEP negative and SPEP negative.  Labs (1/19): LDL 79, HDL 38, BNP 493, hgb 13.6, K 5.8 => 4.8, creatinine 1.18  PMH: 1. PAD: L=>R fem-fem  bypass in 3/16.   - ABIs (3/18): Mildly abnormal, 0.81 left and 0.85 right.  2. H/o appendectomy 3. COPD: Quit smoking in 2/12.  4. Arthritis: Bilateral TKR 5. Diet-controlled diabetes 6. Hyperlipidemia 7. Carotid stenosis:  - 10/18 Carotid dopplers: 60-79% RICA stenosis, 96-29% LICA stenosis, left subclavian steal.  8. Chronic systolic CHF: Echo (5/28) with EF 20-25%, mild LV dilation, normal RV size and systolic function. NICM.  - LHC (10/17): Nonobstructive CAD.  - Biotronik ICD.  - LHC/RHC (12/18): 50% proximal PLOM (no obstructive disease). Mean RA 1, PA 32/10 mean 19, mean PCWP 7, CI 2.0.  - Echo (12/18): EF 20-25%, mild basal LV hypertrophy, severe mid to apical LV hypertrophy, moderate diastolic dysfunction, mild MR, PASP 42 mmHg.  - Workup appears negative for AL amyloidosis.  Negative abdominal fat pad biopsy.   SH: Lives with husband in [place], 3 daughters.  Quit smoking in 2012.    Family History  Problem Relation Age of Onset  . Heart disease Mother   . Hypertension Mother   Daughter with cardiomyopathy. Nephews with CAD.   ROS: All systems reviewed and negative except as per HPI.   Current Outpatient Medications  Medication Sig Dispense Refill  . acetaminophen (TYLENOL) 650 MG CR tablet Take 650 mg by mouth every 8 (eight) hours as needed for pain.    [name] albuterol (PROVENTIL HFA;VENTOLIN HFA) 108 (90 BASE) MCG/ACT inhaler Inhale 2 puffs into  the lungs every 6 (six) hours as needed for wheezing or shortness of breath.    [name] albuterol (PROVENTIL) (2.5 MG/3ML) 0.083% nebulizer solution Take 3 mLs by nebulization every 4 (four) hours as needed for wheezing.    [name] aspirin EC 81 MG tablet Take 162 mg by mouth daily.    [name] atorvastatin (LIPITOR) 20 MG tablet Take 1 tablet (20 mg total) by mouth daily. 30 tablet 3  . carvedilol (COREG) 12.5 MG tablet TAKE ONE TABLET BY MOUTH TWICE DAILY 180 tablet 1  . DIGOX 125 MCG tablet TAKE 1/2 TABLET BY MOUTH ONCE DAILY ON MONDAYS,  WEDNESDAYS, AND FRIDAYS 27 tablet 6  . fluticasone furoate-vilanterol (BREO ELLIPTA) 200-25 MCG/INH AEPB Inhale 1 puff into the lungs daily.    [name] glimepiride (AMARYL) 2 MG tablet Take 2 mg by mouth daily.    [name] losartan (COZAAR) 25 MG tablet Take 1 tablet (25 mg total) by mouth at bedtime. 30 tablet 3  . nystatin (MYCOSTATIN) 100000 UNIT/ML suspension Take 5 mLs (500,000 Units total) by mouth 4 (four) times daily. Swish in the mouth for a couple of mins prior to swallowing. 200 mL 0  . OXcarbazepine (TRILEPTAL) 150 MG tablet Take 150 mg by mouth 2 (two) times daily.    . pregabalin (LYRICA) 150 MG capsule Take 150 mg by mouth 2 (two) times daily.    [name] torsemide (DEMADEX) 20 MG tablet Take 2 tablets (40 mg total) by mouth daily. 60 tablet 6   No current facility-administered medications for this encounter.    BP 95/67 (BP Location: Right Arm, Patient Position: Sitting, Cuff Size: Large)   Pulse 73   Wt 180 lb 12.8 oz (82 kg)   SpO2 95%   BMI 30.09 kg/m   General: NAD Neck: JVP 10 cm, no thyromegaly or thyroid nodule.  Lungs: Clear to auscultation bilaterally with normal respiratory effort. CV: Nondisplaced PMI.  Heart regular S1/S2, no S3/S4, no murmur.  1+ ankle edema.  No carotid bruit.  Normal pedal pulses.  Abdomen: Soft, nontender, no hepatosplenomegaly, no distention.  Skin: Intact without lesions or rashes.  Neurologic: Alert and oriented x 3.  Psych: Normal affect. Extremities: No clubbing or cyanosis.  HEENT: Normal.   Assessment/Plan: 1. Chronic systolic CHF: Nonischemic cardiomyopathy.  EF 20-25% on echo in 2/18.  LHC/RHC in 12/18 with low filling pressures, CI 2.0, and nonobstructive CAD. She has a Biotronik ICD.  Echo in 12/18 with EF 20-25%, noted to have LVH pattern that is mild in the basal segments and moderate to severe in the apical segments.  Also of note, daughter and nephew have sarcoidosis.  Differential diagnosis is fairly wide.  Based on testing so far, I do not  think that she has AL amyloidosis. Abdominal fat pad biopsy was negative but does not fully rule out TTR amyloidosis. Cardiac sarcoidosis is a consideration given family history though she has not history of pulmonary sarcoidosis.  We are unable to do an MRI to look for infiltrative disease due to ICD.  Viral myocarditis or familial cardiomyopathy are possibilities.  Also would consider apical HCM or noncompaction based on the appearance of the ventricle on 12/18 echo. She is volume overloaded on exam with NYHA class III symptoms.  - Needs repeat limited echo with contrast to assess for evidence of LV noncompaction or apical HCM.  - I will arrange for a PYP nuclear scan to assess for evidence of TTR amyloidosis, which may be treatable.  - I will arrange  for noncontrast CT chest to assess for evidence of sarcoidosis in the lungs or mediastinum.   - Restart losartan 25 mg qhs. She did not tolerate low dose Entresto.  - Off spironolactone with hyperkalemia.  - Continue digoxin, check digoxin level.  - Continue Coreg.  - Increase torsemide to 40 qam/20 qpm for 2 days then 40 mg daily after that.  BMET today and in 10 days.   - Given family history of cardiomyopathy, would be reasonable for 1st degree family members to get echoes - Refer to cardiac rehab at [place] in 10 days with NP/PA.   2. PAD: s/p fem-fem bypass, followed by [facility].  No claudication, last ABIs mildly abnormal. Due for repeat ABIs, should get through [facility].  3. Carotid stenosis: Moderate bilateral stenosis on last dopplers, followed at [facility].  4. Hyperlipidemia: Continue atorvastatin. Recent lipids were acceptable.   Loralie Champagne 10/24/2017

## 2017-10-24 NOTE — Patient Instructions (Addendum)
Restart Losartan 25 mg (1 tab) every night  Increase Torsemide 40 mg (2 tabs) in AM, then 20 mg (1 tab) for 2 days  -Then 40 mg (2 tabs) daily  You have been referred to Cardiac Rehab in [facility] (they will call you)   Non-Cardiac CT scanning, (CAT scanning), is a noninvasive, special x-ray that produces cross-sectional images of the body using x-rays and a computer. CT scans help physicians diagnose and treat medical conditions. For some CT exams, a contrast material is used to enhance visibility in the area of the body being studied. CT scans provide greater clarity and reveal more details than regular x-ray exams.  Your physician has requested that you have an echocardiogram. Echocardiography is a painless test that uses sound waves to create images of your heart. It provides your doctor with information about the size and shape of your heart and how well your heart's chambers and valves are working. This procedure takes approximately one hour. There are no restrictions for this procedure.  You have been referred to have a PYP test for amyloid   Labs drawn today (if we do not call you, then your lab work was stable)   Your physician recommends that you return for lab work in: 10 days with appointment  Your physician recommends that you schedule a follow-up appointment in: 10 days with [facility] and lab work

## 2017-10-26 ENCOUNTER — Encounter (HOSPITAL_COMMUNITY): Payer: Self-pay

## 2017-10-26 ENCOUNTER — Ambulatory Visit: Payer: Medicare Other | Admitting: Hematology

## 2017-10-26 ENCOUNTER — Encounter (HOSPITAL_COMMUNITY): Payer: Medicare Other | Admitting: Cardiology

## 2017-10-26 ENCOUNTER — Telehealth: Payer: Self-pay

## 2017-10-26 NOTE — Telephone Encounter (Signed)
Called and spoke with pt who forgot about her appt today. Asked if she would like for me to notify scheduling to reschedule. Pt declined. This RN asked if she needed the number to call back to f/u when she is ready. Pt said, "No, I have a paper with Dr. Grier Mitts name and information on it." No scheduling message sent at this time per pt request. Dr. Irene Limbo notified of pt decision.

## 2017-10-31 ENCOUNTER — Ambulatory Visit (HOSPITAL_COMMUNITY)
Admission: RE | Admit: 2017-10-31 | Discharge: 2017-10-31 | Disposition: A | Discharge status: Home / Self Care | Payer: Medicare Other | Source: Ambulatory Visit | Attending: Cardiology | Admitting: Cardiology

## 2017-10-31 ENCOUNTER — Encounter (HOSPITAL_COMMUNITY): Payer: Medicare Other

## 2017-10-31 DIAGNOSIS — D869 Sarcoidosis, unspecified: Secondary | ICD-10-CM | POA: Insufficient documentation

## 2017-10-31 DIAGNOSIS — I2510 Atherosclerotic heart disease of native coronary artery without angina pectoris: Secondary | ICD-10-CM | POA: Insufficient documentation

## 2017-10-31 DIAGNOSIS — J432 Centrilobular emphysema: Secondary | ICD-10-CM | POA: Diagnosis not present

## 2017-10-31 DIAGNOSIS — I700 Atherosclerosis of aorta: Secondary | ICD-10-CM | POA: Diagnosis not present

## 2017-10-31 DIAGNOSIS — I517 Cardiomegaly: Secondary | ICD-10-CM | POA: Insufficient documentation

## 2017-10-31 DIAGNOSIS — J438 Other emphysema: Secondary | ICD-10-CM | POA: Insufficient documentation

## 2017-11-02 ENCOUNTER — Other Ambulatory Visit (HOSPITAL_COMMUNITY): Payer: Medicare Other

## 2017-11-02 ENCOUNTER — Telehealth (HOSPITAL_COMMUNITY): Payer: Self-pay | Admitting: *Deleted

## 2017-11-02 MED ORDER — TORSEMIDE 20 MG PO TABS: 60.0000 mg | ORAL_TABLET | Freq: Every day | ORAL | 6 refills | Status: DC

## 2017-11-02 NOTE — Telephone Encounter (Signed)
Advanced Heart Failure Triage Encounter  Patient Name: Lisa [name]  Date of Call: 11/02/17  Problem:   Patient called complaining of shortness of breath during the day and night.  This has been ongoing.  Wt is down to 175 lb but she has pitting lower extremity edema.  Plan:  Spoke with Dr. Aundra Dubin and he advises patient to increase torsemide to 60 mg Daily.  Patient has follow up in clinic on Monday. Patient is aware and will increase torsemide to 60 mg daily.  No further questions.     Darron Doom, RN

## 2017-11-05 ENCOUNTER — Telehealth (HOSPITAL_COMMUNITY): Payer: Self-pay | Admitting: Vascular Surgery

## 2017-11-05 ENCOUNTER — Encounter (HOSPITAL_COMMUNITY): Payer: Self-pay

## 2017-11-05 ENCOUNTER — Ambulatory Visit (HOSPITAL_COMMUNITY)
Admission: RE | Admit: 2017-11-05 | Discharge: 2017-11-05 | Disposition: A | Discharge status: Home / Self Care | Payer: Medicare Other | Source: Ambulatory Visit | Attending: Internal Medicine | Admitting: Internal Medicine

## 2017-11-05 VITALS — BP 82/68 | HR 63 | Wt 176.8 lb

## 2017-11-05 DIAGNOSIS — E859 Amyloidosis, unspecified: Secondary | ICD-10-CM

## 2017-11-05 DIAGNOSIS — I2510 Atherosclerotic heart disease of native coronary artery without angina pectoris: Secondary | ICD-10-CM | POA: Diagnosis not present

## 2017-11-05 DIAGNOSIS — I5022 Chronic systolic (congestive) heart failure: Secondary | ICD-10-CM

## 2017-11-05 DIAGNOSIS — Z7982 Long term (current) use of aspirin: Secondary | ICD-10-CM | POA: Insufficient documentation

## 2017-11-05 DIAGNOSIS — I739 Peripheral vascular disease, unspecified: Secondary | ICD-10-CM | POA: Diagnosis not present

## 2017-11-05 DIAGNOSIS — J449 Chronic obstructive pulmonary disease, unspecified: Secondary | ICD-10-CM | POA: Diagnosis not present

## 2017-11-05 DIAGNOSIS — Z79899 Other long term (current) drug therapy: Secondary | ICD-10-CM | POA: Diagnosis not present

## 2017-11-05 DIAGNOSIS — R5383 Other fatigue: Secondary | ICD-10-CM

## 2017-11-05 DIAGNOSIS — I429 Cardiomyopathy, unspecified: Secondary | ICD-10-CM | POA: Insufficient documentation

## 2017-11-05 DIAGNOSIS — I428 Other cardiomyopathies: Secondary | ICD-10-CM

## 2017-11-05 DIAGNOSIS — R0683 Snoring: Secondary | ICD-10-CM | POA: Diagnosis not present

## 2017-11-05 DIAGNOSIS — Z7984 Long term (current) use of oral hypoglycemic drugs: Secondary | ICD-10-CM | POA: Insufficient documentation

## 2017-11-05 DIAGNOSIS — I6523 Occlusion and stenosis of bilateral carotid arteries: Secondary | ICD-10-CM | POA: Diagnosis not present

## 2017-11-05 DIAGNOSIS — E119 Type 2 diabetes mellitus without complications: Secondary | ICD-10-CM | POA: Insufficient documentation

## 2017-11-05 DIAGNOSIS — Z95810 Presence of automatic (implantable) cardiac defibrillator: Secondary | ICD-10-CM | POA: Insufficient documentation

## 2017-11-05 DIAGNOSIS — E785 Hyperlipidemia, unspecified: Secondary | ICD-10-CM | POA: Insufficient documentation

## 2017-11-05 DIAGNOSIS — Z87891 Personal history of nicotine dependence: Secondary | ICD-10-CM | POA: Insufficient documentation

## 2017-11-05 LAB — BASIC METABOLIC PANEL
ANION GAP: 12 (ref 5–15)
BUN: 18 mg/dL (ref 6–20)
CALCIUM: 8.8 mg/dL — AB (ref 8.9–10.3)
CO2: 32 mmol/L (ref 22–32)
CREATININE: 1.06 mg/dL — AB (ref 0.44–1.00)
Chloride: 97 mmol/L — ABNORMAL LOW (ref 101–111)
GFR calc non Af Amer: 52 mL/min — ABNORMAL LOW (ref >60)
Glucose, Bld: 203 mg/dL — ABNORMAL HIGH (ref 65–99)
Potassium: 3.3 mmol/L — ABNORMAL LOW (ref 3.5–5.1)
SODIUM: 141 mmol/L (ref 135–145)

## 2017-11-05 MED ORDER — TORSEMIDE 20 MG PO TABS: 60.0000 mg | ORAL_TABLET | Freq: Every day | ORAL | 6 refills | Status: DC

## 2017-11-05 NOTE — Telephone Encounter (Signed)
Spoke to pt giving her [facility] appt 11/13/17, pt voiced she will be her @ 8:45 arrival

## 2017-11-05 NOTE — Progress Notes (Signed)
PCP: Dr. Loistine Simas Cardiology: Dr. Geraldo Pitter HF Cardiology: Dr. Aundra Dubin  72 yo with history of PAD, COPD, carotid stenosis, and chronic systolic CHF was referred by Dr. Geraldo Pitter for evaluation/treatment of CHF.    Patient has a long history of vascular disease.  She had left-right fem-fem bypass in 3/16. She has moderate bilateral carotid stenosis.  Echo in 2/18 showed EF 20-25%.  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  She was admitted in 9/18 to the hospital in [facility] for about 4 days with acute on chronic systolic CHF.    RHC/LHC in 12/18 showed nonobstructive coronary disease, low filling pressures, and CI 2.0.   Admitted 09/20/17 with volume overload. Diuresed with IV lasix. Weight went down 2 pounds. Discharged on torsemide 40 mg daily. Discharge weight 179 pounds.   Echo in 12/18 with EF 20-25%, mild basal ventricular hypertrophy, severe mid to apical LV hypertrophy.    She was noted to have Bence-Jones protein on UPEP.  However, SPEP was negative and 24 hours UPEP was also negative. Abdominal fat pad biopsy did not show evidence of amyloidosis.  She saw heme-onc, probably does not have AL amyloidosis.   Today she returns for HF follow. Overall feeling ok. Had breast biopsy earlier today. On Friday she was instructed to take 60 mg torsemide due to increased shortness of breath. Had [facility] on Friday. Says she felt better after taking extra torsemide. Weight went down from 178-->176 pounds.  Having day time fatigue and snoring. Denies SOB/PND/Orthopnea. Appetite ok. No fever or chills.  Taking all medications.   Unable to obtain PYP scan due to insurance.   Labs (8/18): LDL 80, HDL 46 Labs (10/18): K 4.2, creatinine 1.1 Labs (11/18): digoxin 0.3 Labs (12/18): K 3.5, creatinine 1.02, UPEP with faint Bence-Jones protein but 24 hour UPEP negative and SPEP negative.  Labs (1/19): LDL 79, HDL 38, BNP 493, hgb 13.6, K 5.8 => 4.8, creatinine 1.18  PMH: 1.  PAD: L=>R fem-fem bypass in 3/16.   - ABIs (3/18): Mildly abnormal, 0.81 left and 0.85 right.  2. H/o appendectomy 3. COPD: Quit smoking in 2/12.  4. Arthritis: Bilateral TKR 5. Diet-controlled diabetes 6. Hyperlipidemia 7. Carotid stenosis:  - 10/18 Carotid dopplers: 60-79% RICA stenosis, 60-73% LICA stenosis, left subclavian steal.  8. Chronic systolic CHF: Echo (7/10) with EF 20-25%, mild LV dilation, normal RV size and systolic function. NICM.  - LHC (10/17): Nonobstructive CAD.  - Biotronik ICD.  - LHC/RHC (12/18): 50% proximal PLOM (no obstructive disease). Mean RA 1, PA 32/10 mean 19, mean PCWP 7, CI 2.0.  - Echo (12/18): EF 20-25%, mild basal LV hypertrophy, severe mid to apical LV hypertrophy, moderate diastolic dysfunction, mild MR, PASP 42 mmHg.  - Workup appears negative for AL amyloidosis.  Negative abdominal fat pad biopsy.   SH: Lives with husband in [place], 3 daughters.  Quit smoking in 2012.    Family History  Problem Relation Age of Onset  . Heart disease Mother   . Hypertension Mother   Daughter with cardiomyopathy. Nephews with CAD.   ROS: All systems reviewed and negative except as per HPI.   Current Outpatient Medications  Medication Sig Dispense Refill  . acetaminophen (TYLENOL) 650 MG CR tablet Take 650 mg by mouth every 8 (eight) hours as needed for pain.    [name] albuterol (PROVENTIL HFA;VENTOLIN HFA) 108 (90 BASE) MCG/ACT inhaler Inhale 2 puffs into the lungs every 6 (six) hours as needed for wheezing or shortness of  breath.    [name] albuterol (PROVENTIL) (2.5 MG/3ML) 0.083% nebulizer solution Take 3 mLs by nebulization every 4 (four) hours as needed for wheezing.    [name] aspirin EC 81 MG tablet Take 162 mg by mouth daily.    [name] atorvastatin (LIPITOR) 20 MG tablet Take 1 tablet (20 mg total) by mouth daily. 30 tablet 3  . carvedilol (COREG) 12.5 MG tablet TAKE ONE TABLET BY MOUTH TWICE DAILY 180 tablet 1  . DIGOX 125 MCG tablet TAKE 1/2 TABLET BY MOUTH ONCE DAILY  ON MONDAYS, WEDNESDAYS, AND FRIDAYS 27 tablet 6  . fluticasone furoate-vilanterol (BREO ELLIPTA) 200-25 MCG/INH AEPB Inhale 1 puff into the lungs daily.    [name] glimepiride (AMARYL) 2 MG tablet Take 2 mg by mouth daily.    [name] losartan (COZAAR) 25 MG tablet Take 1 tablet (25 mg total) by mouth at bedtime. 30 tablet 3  . nystatin (MYCOSTATIN) 100000 UNIT/ML suspension Take 5 mLs (500,000 Units total) by mouth 4 (four) times daily. Swish in the mouth for a couple of mins prior to swallowing. 200 mL 0  . OXcarbazepine (TRILEPTAL) 150 MG tablet Take 150 mg by mouth 2 (two) times daily.    . pregabalin (LYRICA) 150 MG capsule Take 150 mg by mouth 2 (two) times daily.    [name] torsemide (DEMADEX) 20 MG tablet Take 3 tablets (60 mg total) by mouth daily. Take extra tablet once weekly AS NEEDED for weight 178 lbs or more. 90 tablet 6   No current facility-administered medications for this encounter.    BP (!) 82/68 (BP Location: Right Arm, Patient Position: Sitting, Cuff Size: Large)   Pulse 63   Wt 176 lb 12.8 oz (80.2 kg)   SpO2 93%   BMI 29.42 kg/m   General:   No resp difficulty. Arrived in a wheel chair.  HEENT: normal Neck: supple. JVP 5-6. Carotids 2+ bilat; no bruits. No lymphadenopathy or thryomegaly appreciated. Cor: PMI nondisplaced. Regular rate & rhythm. No rubs, gallops or murmurs. Lungs: clear Abdomen: soft, nontender, nondistended. No hepatosplenomegaly. No bruits or masses. Good bowel sounds. Extremities: no cyanosis, clubbing, rash, edema Neuro: alert & orientedx3, cranial nerves grossly intact. moves all 4 extremities w/o difficulty. Affect pleasant   Assessment/Plan: 1. Chronic systolic CHF: Nonischemic cardiomyopathy.  EF 20-25% on echo in 2/18.  LHC/RHC in 12/18 with low filling pressures, CI 2.0, and nonobstructive CAD. She has a Biotronik ICD.  Echo in 12/18 with EF 20-25%, noted to have LVH pattern that is mild in the basal segments and moderate to severe in the apical segments.   Also of note, daughter and nephew have sarcoidosis.  Differential diagnosis is fairly wide.  Based on testing so far, I do not think that she has AL amyloidosis. Abdominal fat pad biopsy was negative but does not fully rule out TTR amyloidosis. Cardiac sarcoidosis is a consideration given family history though she has not history of pulmonary sarcoidosis.  We are unable to do an MRI to look for infiltrative disease due to ICD.  Viral myocarditis or familial cardiomyopathy are possibilities.  Also would consider apical HCM or noncompaction based on the appearance of the ventricle on 12/18 echo. She is volume overloaded on exam with NYHA class III symptoms.  - Needs repeat limited echo with contrast to assess for evidence of LV noncompaction or apical HCM.  -PYP nuclear scan to assess for evidence of TTR amyloidosis, which may be treatable.  - CT --> COPD.   Insurance wound not improve  PYP scan.  NYHA II. Volume status stable. Continue torsemide 60 mg daily. She was instructed to take an extra 20 mg torsemide for weight 178 or greater.  - Continue  losartan 25 mg qhs. She did not tolerate low dose Entresto.  - Off spironolactone with hyperkalemia.  - Continue digoxin, check digoxin level.  - Continue Coreg.  -  Given family history of cardiomyopathy, would be reasonable for 1st degree family members to get echoes - Refer to cardiac rehab at [facility].  Discussed low salt food choices and limiting fluid intake to < 2 liters per day. Asked to avoid [employer].   2. PAD: s/p fem-fem bypass, followed by [facility].  No claudication, last ABIs mildly abnormal. Due for repeat ABIs, should get through [facility].  3. Carotid stenosis: Moderate bilateral stenosis on last dopplers, followed at [facility].  4. Hyperlipidemia: Continue statin.  Recent lipids were acceptable.  5. Day time fatigue, snorinng.--? OSA. Set up for sleep study.[name]   BMET today   Jarriel Papillion NP-C  11/05/2017

## 2017-11-05 NOTE — Patient Instructions (Addendum)
Continue taking torsemide 60 mg (3 tabs) once daily. Take extra tablet once weekly AS NEEDED for weight 178 lbs or more. Call CHF clinic if this does not resolve.  Routine lab work today. Will notify you of abnormal results, otherwise no news is good news!  Will schedule you for sleep study at [facility]. Address: [address], [place], [place] [address] Phone: [phone] Their office will call you to schedule.  Follow up 4 weeks with Dr. Aundra Dubin.  Take all medication as prescribed the day of your appointment. Bring all medications with you to your appointment.  Do the following things EVERYDAY: 1) Weigh yourself in the morning before breakfast. Write it down and keep it in a log. 2) Take your medicines as prescribed 3) Eat low salt foods-Limit salt (sodium) to 2000 mg per day.  4) Stay as active as you can everyday 5) Limit all fluids for the day to less than 2 liters

## 2017-11-08 ENCOUNTER — Telehealth (HOSPITAL_COMMUNITY): Payer: Self-pay | Admitting: Cardiology

## 2017-11-08 MED ORDER — POTASSIUM CHLORIDE CRYS ER 20 MEQ PO TBCR: 40.0000 meq | EXTENDED_RELEASE_TABLET | Freq: Every day | ORAL | 3 refills | Status: DC

## 2017-11-08 NOTE — Telephone Encounter (Signed)
Patient aware. Patient voiced understanding  

## 2017-11-13 ENCOUNTER — Encounter (HOSPITAL_COMMUNITY): Payer: Medicare Other

## 2017-11-13 ENCOUNTER — Encounter (HOSPITAL_COMMUNITY): Admission: RE | Admit: 2017-11-13 | Payer: Medicare Other | Source: Ambulatory Visit

## 2017-11-13 ENCOUNTER — Encounter (HOSPITAL_COMMUNITY)
Admission: RE | Admit: 2017-11-13 | Discharge: 2017-11-13 | Disposition: A | Discharge status: Home / Self Care | Payer: Medicare Other | Source: Ambulatory Visit | Attending: Cardiology | Admitting: Cardiology

## 2017-11-13 DIAGNOSIS — E859 Amyloidosis, unspecified: Secondary | ICD-10-CM | POA: Diagnosis not present

## 2017-11-13 MED ORDER — TECHNETIUM TC 99M PYROPHOSPHATE
20.0000 | Freq: Once | INTRAVENOUS | Status: AC
  Administered 2017-11-13: 20 via INTRAVENOUS
  Filled 2017-11-13: qty 20

## 2017-11-14 ENCOUNTER — Ambulatory Visit (HOSPITAL_COMMUNITY): Payer: Medicare Other | Attending: Cardiology

## 2017-11-14 ENCOUNTER — Other Ambulatory Visit: Payer: Self-pay

## 2017-11-14 ENCOUNTER — Ambulatory Visit (INDEPENDENT_AMBULATORY_CARE_PROVIDER_SITE_OTHER): Payer: Medicare Other | Admitting: *Deleted

## 2017-11-14 DIAGNOSIS — I340 Nonrheumatic mitral (valve) insufficiency: Secondary | ICD-10-CM | POA: Insufficient documentation

## 2017-11-14 DIAGNOSIS — E785 Hyperlipidemia, unspecified: Secondary | ICD-10-CM

## 2017-11-14 DIAGNOSIS — J449 Chronic obstructive pulmonary disease, unspecified: Secondary | ICD-10-CM | POA: Insufficient documentation

## 2017-11-14 DIAGNOSIS — I5022 Chronic systolic (congestive) heart failure: Secondary | ICD-10-CM | POA: Diagnosis not present

## 2017-11-14 DIAGNOSIS — E119 Type 2 diabetes mellitus without complications: Secondary | ICD-10-CM | POA: Diagnosis not present

## 2017-11-14 DIAGNOSIS — I428 Other cardiomyopathies: Secondary | ICD-10-CM | POA: Diagnosis not present

## 2017-11-14 MED ORDER — PERFLUTREN LIPID MICROSPHERE
1.0000 mL | INTRAVENOUS | Status: AC | PRN
  Administered 2017-11-14: 3 mL via INTRAVENOUS

## 2017-11-14 NOTE — Progress Notes (Signed)
Remote ICD transmission.   

## 2017-11-15 ENCOUNTER — Encounter: Payer: Self-pay | Admitting: Cardiology

## 2017-11-19 LAB — CUP PACEART REMOTE DEVICE CHECK
Battery Remaining Percentage: 64 %
Brady Statistic AP VP Percent: 31 %
Brady Statistic AP VS Percent: 29 %
Brady Statistic RA Percent Paced: 48 %
Brady Statistic RV Percent Paced: 32 %
Date Time Interrogation Session: 20190225061133
HIGH POWER IMPEDANCE MEASURED VALUE: 73 Ohm
Implantable Lead Implant Date: 20140812
Implantable Lead Model: 350
Implantable Lead Serial Number: 10491693
Implantable Lead Serial Number: 29451060
Lead Channel Impedance Value: 1331 Ohm
Lead Channel Impedance Value: 480 Ohm
Lead Channel Pacing Threshold Pulse Width: 0.4 ms
Lead Channel Setting Pacing Pulse Width: 0.4 ms
Lead Channel Setting Sensing Sensitivity: 0.8 mV
MDC IDC LEAD IMPLANT DT: 20140812
MDC IDC LEAD LOCATION: 753859
MDC IDC LEAD LOCATION: 753860
MDC IDC MSMT BATTERY VOLTAGE: 3.1 V
MDC IDC MSMT LEADCHNL RV PACING THRESHOLD AMPLITUDE: 0.4 V
MDC IDC PG IMPLANT DT: 20140812
MDC IDC SET LEADCHNL RA PACING AMPLITUDE: 3.5 V
MDC IDC SET LEADCHNL RV PACING AMPLITUDE: 2.5 V
MDC IDC STAT BRADY AS VP PERCENT: 1 %
MDC IDC STAT BRADY AS VS PERCENT: 36 %
Pulse Gen Model: 383562
Pulse Gen Serial Number: 60721404

## 2017-11-26 ENCOUNTER — Encounter (HOSPITAL_COMMUNITY): Payer: Self-pay

## 2017-12-04 ENCOUNTER — Telehealth (HOSPITAL_COMMUNITY): Payer: Self-pay | Admitting: *Deleted

## 2017-12-04 NOTE — Telephone Encounter (Signed)
Brianna with [employer] called regarding patient's genetic testing--patient's sample failed.  A new sample can be submitted and they will run free of charge.  Also she stated she sent email to Shirley Muscat regarding a discrepancy in the specimen waiver form and she is asking for a return call.  Her number is [phone].   Will forward to Shirley Muscat, RN to follow up with [place].

## 2017-12-06 ENCOUNTER — Other Ambulatory Visit (HOSPITAL_COMMUNITY): Payer: Self-pay | Admitting: Cardiology

## 2017-12-12 ENCOUNTER — Telehealth (HOSPITAL_COMMUNITY): Payer: Self-pay | Admitting: *Deleted

## 2017-12-12 NOTE — Telephone Encounter (Signed)
Brianna with [employer] called regarding patient's genetic testing--patient's sample failed. She wants to know if we will be submitting a new sample. Her number is [phone]. She called and spoke with Susie on 12/04/17 and Susie forwarded to [employer].   Will forward to Shirley Muscat, RN to follow up with [place].

## 2017-12-13 ENCOUNTER — Encounter (HOSPITAL_COMMUNITY): Payer: Self-pay | Admitting: Cardiology

## 2017-12-13 ENCOUNTER — Ambulatory Visit (HOSPITAL_COMMUNITY)
Admission: RE | Admit: 2017-12-13 | Discharge: 2017-12-13 | Disposition: A | Discharge status: Home / Self Care | Payer: Medicare Other | Source: Ambulatory Visit | Attending: Cardiology | Admitting: Cardiology

## 2017-12-13 VITALS — BP 118/60 | HR 59 | Wt 175.8 lb

## 2017-12-13 DIAGNOSIS — Z79899 Other long term (current) drug therapy: Secondary | ICD-10-CM | POA: Diagnosis not present

## 2017-12-13 DIAGNOSIS — E785 Hyperlipidemia, unspecified: Secondary | ICD-10-CM | POA: Insufficient documentation

## 2017-12-13 DIAGNOSIS — J449 Chronic obstructive pulmonary disease, unspecified: Secondary | ICD-10-CM | POA: Insufficient documentation

## 2017-12-13 DIAGNOSIS — Z87891 Personal history of nicotine dependence: Secondary | ICD-10-CM | POA: Insufficient documentation

## 2017-12-13 DIAGNOSIS — R0683 Snoring: Secondary | ICD-10-CM | POA: Diagnosis not present

## 2017-12-13 DIAGNOSIS — Z7984 Long term (current) use of oral hypoglycemic drugs: Secondary | ICD-10-CM | POA: Diagnosis not present

## 2017-12-13 DIAGNOSIS — I5022 Chronic systolic (congestive) heart failure: Secondary | ICD-10-CM

## 2017-12-13 DIAGNOSIS — I428 Other cardiomyopathies: Secondary | ICD-10-CM | POA: Diagnosis not present

## 2017-12-13 DIAGNOSIS — E119 Type 2 diabetes mellitus without complications: Secondary | ICD-10-CM | POA: Insufficient documentation

## 2017-12-13 DIAGNOSIS — M109 Gout, unspecified: Secondary | ICD-10-CM | POA: Diagnosis not present

## 2017-12-13 DIAGNOSIS — E851 Neuropathic heredofamilial amyloidosis: Secondary | ICD-10-CM | POA: Diagnosis not present

## 2017-12-13 DIAGNOSIS — I739 Peripheral vascular disease, unspecified: Secondary | ICD-10-CM | POA: Diagnosis not present

## 2017-12-13 DIAGNOSIS — E859 Amyloidosis, unspecified: Secondary | ICD-10-CM | POA: Diagnosis not present

## 2017-12-13 DIAGNOSIS — I2510 Atherosclerotic heart disease of native coronary artery without angina pectoris: Secondary | ICD-10-CM | POA: Diagnosis not present

## 2017-12-13 DIAGNOSIS — Z7982 Long term (current) use of aspirin: Secondary | ICD-10-CM | POA: Diagnosis not present

## 2017-12-13 DIAGNOSIS — Z7951 Long term (current) use of inhaled steroids: Secondary | ICD-10-CM | POA: Insufficient documentation

## 2017-12-13 DIAGNOSIS — G629 Polyneuropathy, unspecified: Secondary | ICD-10-CM | POA: Insufficient documentation

## 2017-12-13 DIAGNOSIS — I429 Cardiomyopathy, unspecified: Secondary | ICD-10-CM | POA: Diagnosis not present

## 2017-12-13 DIAGNOSIS — Z95810 Presence of automatic (implantable) cardiac defibrillator: Secondary | ICD-10-CM | POA: Insufficient documentation

## 2017-12-13 DIAGNOSIS — I6523 Occlusion and stenosis of bilateral carotid arteries: Secondary | ICD-10-CM | POA: Diagnosis not present

## 2017-12-13 LAB — BASIC METABOLIC PANEL
ANION GAP: 9 (ref 5–15)
BUN: 16 mg/dL (ref 6–20)
CALCIUM: 9.7 mg/dL (ref 8.9–10.3)
CO2: 30 mmol/L (ref 22–32)
CREATININE: 0.87 mg/dL (ref 0.44–1.00)
Chloride: 101 mmol/L (ref 101–111)
GFR calc Af Amer: >60 mL/min (ref >60)
GFR calc non Af Amer: >60 mL/min (ref >60)
GLUCOSE: 97 mg/dL (ref 65–99)
Potassium: 5.2 mmol/L — ABNORMAL HIGH (ref 3.5–5.1)
Sodium: 140 mmol/L (ref 135–145)

## 2017-12-13 LAB — DIGOXIN LEVEL

## 2017-12-13 MED ORDER — SPIRONOLACTONE 25 MG PO TABS: 12.5000 mg | ORAL_TABLET | Freq: Every day | ORAL | 3 refills | Status: DC

## 2017-12-13 MED ORDER — POTASSIUM CHLORIDE CRYS ER 20 MEQ PO TBCR: 20.0000 meq | EXTENDED_RELEASE_TABLET | Freq: Every day | ORAL | 3 refills | Status: DC

## 2017-12-13 NOTE — Progress Notes (Signed)
PCP: Dr. Loistine Simas Cardiology: Dr. Geraldo Pitter HF Cardiology: Dr. Aundra Dubin  72 yo with history of PAD, COPD, carotid stenosis, and chronic systolic CHF was referred by Dr. Geraldo Pitter for evaluation/treatment of CHF.    Patient has a long history of vascular disease.  She had left-right fem-fem bypass in 3/16. She has moderate bilateral carotid stenosis.  Echo in 2/18 showed EF 20-25%.  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  She was admitted in 9/18 to the hospital in [facility] for about 4 days with acute on chronic systolic CHF.    RHC/LHC in 12/18 showed nonobstructive coronary disease, low filling pressures, and CI 2.0.   Admitted 09/20/17 with volume overload. Diuresed with IV lasix. Weight went down 2 pounds. Discharged on torsemide 40 mg daily. Discharge weight 179 pounds.   Echo in 12/18 with EF 20-25%, mild basal ventricular hypertrophy, severe mid to apical LV hypertrophy.  Repeat echo in 2/19 was done with Definity contrast, showing that what was thought to be apical hypertrophy was actually prominent mid-apical trabeculation suggestive of noncompaction.   She was noted to have Bence-Jones protein on UPEP.  However, SPEP was negative and 24 hours UPEP was also negative. Abdominal fat pad biopsy did not show evidence of amyloidosis.  She saw heme-onc, probably does not have AL amyloidosis.  However, in 2/19, she had TcPYP scan that was strongly suggestive of transthyretin amyloidosis. She has peripheral neuropathy that may be due to amyloidosis as well.   She returns for followup of CHF.  She has been stable symptomatically.  She is short of breath after walking about 50-75 yards.  No dyspnea walking through her house.  No orthopnea/PND.  No chest pain. Weight down 1 lb.  No lightheadedness/falls. Still has trouble with tingling extremities from peripheral neuropathy.   Labs (8/18): LDL 80, HDL 46 Labs (10/18): K 4.2, creatinine 1.1 Labs (11/18): digoxin  0.3 Labs (12/18): K 3.5, creatinine 1.02, UPEP with faint Bence-Jones protein but 24 hour UPEP negative and SPEP negative.  Labs (1/19): LDL 79, HDL 38, BNP 493, hgb 13.6, K 5.8 => 4.8, creatinine 1.18, digoxin 0.3 Labs (2/19): K 3.3, creatinine 1.06  PMH: 1. PAD: L=>R fem-fem bypass in 3/16.   - ABIs (3/18): Mildly abnormal, 0.81 left and 0.85 right.  2. H/o appendectomy 3. COPD: Quit smoking in 2/12.  - High resolution CT chest (2/19): No interstitial lung disease, +emphysema.  4. Arthritis: Bilateral TKR 5. Diet-controlled diabetes 6. Hyperlipidemia 7. Carotid stenosis:  - 10/18 Carotid dopplers: 60-79% RICA stenosis, 56-38% LICA stenosis, left subclavian steal.  8. Chronic systolic CHF: Echo (7/56) with EF 20-25%, mild LV dilation, normal RV size and systolic function. 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 .  - LHC (10/17): Nonobstructive CAD.  - Biotronik ICD.  - LHC/RHC (12/18): 50% proximal PLOM (no obstructive disease). Mean RA 1, PA 32/10 mean 19, mean PCWP 7, CI 2.0.  - Echo (12/18): EF 20-25%, mild basal LV hypertrophy, severe mid to apical LV hypertrophy, moderate diastolic dysfunction, mild MR, PASP 42 mmHg.  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  - Workup appears negative for AL amyloidosis.  Negative abdominal fat pad biopsy.  However, PYP scan was suggestive of transthyretin amyloidosis.  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  10. Peripheral neuropathy: ?due to amyloidosis.  11. Gout  12. H/o Bell's palsy  SH: Lives with husband in [place], 3 daughters.  Quit smoking in 2012.    Family History  Problem Relation Age of Onset  . Heart disease Mother   . Hypertension Mother   Daughter with cardiomyopathy. Nephews with CAD.   ROS: All systems  reviewed and negative except as per HPI.   Current Outpatient Medications  Medication Sig Dispense Refill  . acetaminophen (TYLENOL) 650 MG CR tablet Take 650 mg by mouth every 8 (eight) hours as needed for pain.    [name] albuterol (PROVENTIL HFA;VENTOLIN HFA) 108 (90 BASE) MCG/ACT inhaler Inhale 2 puffs into the lungs every 6 (six) hours as needed for wheezing or shortness of breath.    [name] albuterol (PROVENTIL) (2.5 MG/3ML) 0.083% nebulizer solution Take 3 mLs by nebulization every 4 (four) hours as needed for wheezing.    [name] aspirin EC 81 MG tablet Take 162 mg by mouth daily.    [name] atorvastatin (LIPITOR) 20 MG tablet TAKE 1 TABLET BY MOUTH DAILY 30 tablet 3  . carvedilol (COREG) 12.5 MG tablet TAKE ONE TABLET BY MOUTH TWICE DAILY 180 tablet 1  . DIGOX 125 MCG tablet TAKE 1/2 TABLET BY MOUTH ONCE DAILY ON MONDAYS, WEDNESDAYS, AND FRIDAYS 27 tablet 6  . fluticasone furoate-vilanterol (BREO ELLIPTA) 200-25 MCG/INH AEPB Inhale 1 puff into the lungs daily.    [name] glimepiride (AMARYL) 2 MG tablet Take 2 mg by mouth daily.    [name] losartan (COZAAR) 25 MG tablet Take 1 tablet (25 mg total) by mouth at bedtime. 30 tablet 3  . nystatin (MYCOSTATIN) 100000 UNIT/ML suspension Take 5 mLs (500,000 Units total) by mouth 4 (four) times daily. Swish in the mouth for a couple of mins prior to swallowing. 200 mL 0  . OXcarbazepine (TRILEPTAL) 150 MG tablet Take 150 mg by mouth 2 (two) times daily.    . potassium chloride SA (K-DUR,KLOR-CON) 20 MEQ tablet Take 1 tablet (20 mEq total) by mouth daily. 30 tablet 3  . pregabalin (LYRICA) 150 MG capsule Take 150 mg by mouth 2 (two) times daily.    [name] torsemide (DEMADEX) 20 MG tablet Take 3 tablets (60 mg total) by mouth daily. Take extra tablet once weekly AS NEEDED for weight 178 lbs or more. 90 tablet 6  . spironolactone (ALDACTONE) 25 MG tablet Take 0.5 tablets (12.5 mg total) by mouth daily. 15 tablet 3   No current facility-administered medications for this encounter.    BP  118/60   Pulse (!) 59   Wt 175 lb 12.8 oz (79.7 kg)   SpO2 96%   BMI 29.25 kg/m   General: NAD Neck: No JVD, no thyromegaly or thyroid nodule.  Lungs: Clear to auscultation bilaterally with normal respiratory effort. CV: Nondisplaced PMI.  Heart regular S1/S2, no S3/S4, no murmur.  No peripheral edema.  No carotid bruit.  Normal pedal pulses.  Abdomen: Soft, nontender, no hepatosplenomegaly, no distention.  Skin: Intact without lesions or rashes.  Neurologic: Alert and oriented x 3.  Psych: Normal affect. Extremities: No clubbing or cyanosis.  HEENT: Normal.   Assessment/Plan: 1. Chronic systolic CHF: Nonischemic cardiomyopathy.  EF 20-25% on echo in 2/18.  LHC/RHC in 12/18 with low filling pressures, CI 2.0, and nonobstructive CAD. She has a Biotronik ICD.  Also of note, daughter and nephew have sarcoidosis => patient had a chest CT in 2/19 without evidence for pulmonary sarcoidosis.  We are unable to do an MRI to look for infiltrative disease due to  ICD.  Echo in 12/18 showed EF 20-25%, noted to have LVH pattern that is mild in the basal segments and moderate to severe in the apical segments. This was concerning for apical HCM and study was repeated in 2/19 with Definity contrast.  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 We also did a workup for amyloidosis. She did not have evidence for AL amyloidosis, but TcPYP scan in 2/19 was strongly suggestive of transthyretin amyloidosis.  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 On exam today, she is not volume overloaded.  NYHA class III symptoms, stable.  - Continue losartan 25 mg qhs. She did not tolerate low dose Entresto.  - K has been low.  I will try her again on spironolactone 12.5 mg daily.  Decrease KCl to 20 daily. BMET today and in 10 days.  - Continue digoxin, check digoxin  level.  - Continue Coreg 12.5 mg bid.  - Continue torsemide 60 mg daily.    - Referred to cardiac rehab at [facility].  2. PAD: s/p fem-fem bypass, followed by [facility].  No claudication, last ABIs mildly abnormal. Due for repeat ABIs, should get through [facility].  3. Carotid stenosis: Moderate bilateral stenosis on last dopplers, followed at [facility].  4. Hyperlipidemia: Continue atorvastatin. Recent lipids were acceptable.  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  - Will send genetic testing to determine wild-type versus familial. She has a peripheral neuropathy.  If familial, would consider patisiran treatment.  If wild-type, will need to wait for tafamidis to come available.   Lisa [name] 12/13/2017

## 2017-12-13 NOTE — Patient Instructions (Signed)
Start Spironolactone 12.5 mg (1/2 tab) daily  Decrease Potassium 20 meq (1 tab) daily  Your physician has recommended that you have a sleep study. This test records several body functions during sleep, including: brain activity, eye movement, oxygen and carbon dioxide blood levels, heart rate and rhythm, breathing rate and rhythm, the flow of air through your mouth and nose, snoring, body muscle movements, and chest and belly movement.  Labs drawn today (if we do not call you, then your lab work was stable)   Your physician recommends that you return for lab work in: 10 days  Your physician recommends that you schedule a follow-up appointment in:6-8 weeks with Dr. Aundra Dubin

## 2017-12-14 ENCOUNTER — Telehealth (HOSPITAL_COMMUNITY): Payer: Self-pay

## 2017-12-14 NOTE — Telephone Encounter (Signed)
Notes recorded by Shirley Muscat, RN on 12/14/2017 at 10:53 AM EDT Notes recorded by Shirley Muscat, RN on 12/14/2017 at 10:53 AM EDT Pt aware of results agreeable to med changes (changes made in [facility]). Pt at [place] received an Rx to have BMET drawn in [place]. ------  Notes recorded by Larey Dresser, MD on 12/13/2017 at 4:13 PM EDT Stop K supplement and repeat BMET in 1 week. ------  Notes recorded by Shirley Muscat, RN on 12/14/2017 at 10:53 AM EDT Pt aware of results agreeable to med changes (changes made in [facility]). Pt at [place] received an Rx to have BMET drawn in [place]. ------  Notes recorded by Larey Dresser, MD on 12/13/2017 at 4:13 PM EDT Stop K supplement and repeat BMET in 1 week.

## 2017-12-20 ENCOUNTER — Telehealth (HOSPITAL_COMMUNITY): Payer: Self-pay | Admitting: Cardiology

## 2017-12-20 NOTE — Telephone Encounter (Signed)
Patient called to report she will not be going to have sleep study- reports she just doesn't want to have it done. Phone number for sleep center provided and message to clinical care team as FYI.

## 2017-12-24 ENCOUNTER — Encounter (HOSPITAL_BASED_OUTPATIENT_CLINIC_OR_DEPARTMENT_OTHER): Payer: Medicare Other

## 2017-12-25 ENCOUNTER — Other Ambulatory Visit: Payer: Self-pay | Admitting: Cardiology

## 2017-12-25 LAB — BASIC METABOLIC PANEL
BUN/Creatinine Ratio: 26 (ref 12–28)
BUN: 25 mg/dL (ref 8–27)
CALCIUM: 10 mg/dL (ref 8.7–10.3)
CHLORIDE: 101 mmol/L (ref 96–106)
CO2: 33 mmol/L — ABNORMAL HIGH (ref 20–29)
Creatinine, Ser: 0.97 mg/dL (ref 0.57–1.00)
GFR calc non Af Amer: 59 mL/min/{1.73_m2} — ABNORMAL LOW (ref >59)
GFR, EST AFRICAN AMERICAN: 68 mL/min/{1.73_m2} (ref >59)
GLUCOSE: 100 mg/dL — AB (ref 65–99)
Potassium: 5 mmol/L (ref 3.5–5.2)
Sodium: 142 mmol/L (ref 134–144)

## 2017-12-28 ENCOUNTER — Other Ambulatory Visit (HOSPITAL_COMMUNITY): Payer: Self-pay | Admitting: Cardiology

## 2018-01-03 ENCOUNTER — Other Ambulatory Visit: Payer: Self-pay

## 2018-01-03 ENCOUNTER — Ambulatory Visit (INDEPENDENT_AMBULATORY_CARE_PROVIDER_SITE_OTHER): Payer: Medicare Other | Admitting: Family

## 2018-01-03 ENCOUNTER — Ambulatory Visit (INDEPENDENT_AMBULATORY_CARE_PROVIDER_SITE_OTHER)
Admission: RE | Admit: 2018-01-03 | Discharge: 2018-01-03 | Disposition: A | Discharge status: Home / Self Care | Payer: Medicare Other | Source: Ambulatory Visit | Attending: Family | Admitting: Family

## 2018-01-03 ENCOUNTER — Encounter: Payer: Self-pay | Admitting: Family

## 2018-01-03 ENCOUNTER — Ambulatory Visit (HOSPITAL_COMMUNITY)
Admission: RE | Admit: 2018-01-03 | Discharge: 2018-01-03 | Disposition: A | Discharge status: Home / Self Care | Payer: Medicare Other | Source: Ambulatory Visit | Attending: Family | Admitting: Family

## 2018-01-03 VITALS — BP 110/69 | HR 60 | Temp 96.7°F | Ht 65.0 in | Wt 178.0 lb

## 2018-01-03 DIAGNOSIS — I771 Stricture of artery: Secondary | ICD-10-CM

## 2018-01-03 DIAGNOSIS — I10 Essential (primary) hypertension: Secondary | ICD-10-CM | POA: Insufficient documentation

## 2018-01-03 DIAGNOSIS — I779 Disorder of arteries and arterioles, unspecified: Secondary | ICD-10-CM | POA: Diagnosis not present

## 2018-01-03 DIAGNOSIS — Z87891 Personal history of nicotine dependence: Secondary | ICD-10-CM | POA: Insufficient documentation

## 2018-01-03 DIAGNOSIS — E1151 Type 2 diabetes mellitus with diabetic peripheral angiopathy without gangrene: Secondary | ICD-10-CM | POA: Diagnosis not present

## 2018-01-03 DIAGNOSIS — I6523 Occlusion and stenosis of bilateral carotid arteries: Secondary | ICD-10-CM | POA: Diagnosis present

## 2018-01-03 NOTE — Patient Instructions (Signed)
Stroke Prevention Some health problems and behaviors may make it more likely for you to have a stroke. Below are ways to lessen your risk of having a stroke.  Be active for at least 30 minutes on most or all days.  Do not smoke. Try not to be around others who smoke.  Do not drink too much alcohol. ? Do not have more than 2 drinks a day if you are a man. ? Do not have more than 1 drink a day if you are a woman and are not pregnant.  Eat healthy foods, such as fruits and vegetables. If you were put on a specific diet, follow the diet as told.  Keep your cholesterol levels under control through diet and medicines. Look for foods that are low in saturated fat, trans fat, cholesterol, and are high in fiber.  If you have diabetes, follow all diet plans and take your medicine as told.  Ask your doctor if you need treatment to lower your blood pressure. If you have high blood pressure (hypertension), follow all diet plans and take your medicine as told by your doctor.  If you are 18-39 years old, have your blood pressure checked every 3-5 years. If you are age 40 or older, have your blood pressure checked every year.  Keep a healthy weight. Eat foods that are low in calories, salt, saturated fat, trans fat, and cholesterol.  Do not take drugs.  Avoid birth control pills, if this applies. Talk to your doctor about the risks of taking birth control pills.  Talk to your doctor if you have sleep problems (sleep apnea).  Take all medicine as told by your doctor. ? You may be told to take aspirin or blood thinner medicine. Take this medicine as told by your doctor. ? Understand your medicine instructions.  Make sure any other conditions you have are being taken care of.  Get help right away if:  You suddenly lose feeling (you feel numb) or have weakness in your face, arm, or leg.  Your face or eyelid hangs down to one side.  You suddenly feel confused.  You have trouble talking  (aphasia) or understanding what people are saying.  You suddenly have trouble seeing in one or both eyes.  You suddenly have trouble walking.  You are dizzy.  You lose your balance or your movements are clumsy (uncoordinated).  You suddenly have a very bad headache and you do not know the cause.  You have new chest pain.  Your heart feels like it is fluttering or skipping a beat (irregular heartbeat). Do not wait to see if the symptoms above go away. Get help right away. Call your local emergency services (911 in [place] Do not drive yourself to the hospital. This information is not intended to replace advice given to you by your health care provider. Make sure you discuss any questions you have with your health care provider. Document Released: 03/12/2012 Document Revised: 02/17/2016 Document Reviewed: 03/14/2013 Elsevier Interactive Patient Education  2018 Elsevier Inc.     Peripheral Vascular Disease Peripheral vascular disease (PVD) is a disease of the blood vessels that are not part of your heart and brain. A simple term for PVD is poor circulation. In most cases, PVD narrows the blood vessels that carry blood from your heart to the rest of your body. This can result in a decreased supply of blood to your arms, legs, and internal organs, like your stomach or kidneys. However, it most often affects   a person's lower legs and feet. There are two types of PVD.  Organic PVD. This is the more common type. It is caused by damage to the structure of blood vessels.  Functional PVD. This is caused by conditions that make blood vessels contract and tighten (spasm).  Without treatment, PVD tends to get worse over time. PVD can also lead to acute ischemic limb. This is when an arm or limb suddenly has trouble getting enough blood. This is a medical emergency. Follow these instructions at home:  Take medicines only as told by your doctor.  Do not use any tobacco products, including  cigarettes, chewing tobacco, or electronic cigarettes. If you need help quitting, ask your doctor.  Lose weight if you are overweight, and maintain a healthy weight as told by your doctor.  Eat a diet that is low in fat and cholesterol. If you need help, ask your doctor.  Exercise regularly. Ask your doctor for some good activities for you.  Take good care of your feet. ? Wear comfortable shoes that fit well. ? Check your feet often for any cuts or sores. Contact a doctor if:  You have cramps in your legs while walking.  You have leg pain when you are at rest.  You have coldness in a leg or foot.  Your skin changes.  You are unable to get or have an erection (erectile dysfunction).  You have cuts or sores on your feet that are not healing. Get help right away if:  Your arm or leg turns cold and blue.  Your arms or legs become red, warm, swollen, painful, or numb.  You have chest pain or trouble breathing.  You suddenly have weakness in your face, arm, or leg.  You become very confused or you cannot speak.  You suddenly have a very bad headache.  You suddenly cannot see. This information is not intended to replace advice given to you by your health care provider. Make sure you discuss any questions you have with your health care provider. Document Released: 12/06/2009 Document Revised: 02/17/2016 Document Reviewed: 02/19/2014 Elsevier Interactive Patient Education  2017 Elsevier Inc.  

## 2018-01-03 NOTE — Progress Notes (Signed)
[facility] [place] HISTORY AND PHYSICAL   CC: Follow up for extracranial carotid artery stenosis and peripheral artery disease    History of Present Illness:   Lisa [name] is a 72 y.o. female who is s/p left to right femoral-femoral bypass, right femoral endarterectomy on 12/21/14 by Dr. Oneida Alar. She also has aknown history of bilateral carotid stenosis.   She returns today for routine follow up.   She has a history of bilateral total knee replacements. Dr. Aundra Dubin is her cardiologist for CHF. Her PCP is Dr. [name].   She rides a stationary bile 5 days/week for 30 minutes, then lifts weights, since 2013.   Pt she states she has painful neuropathy in her feet and legs, and has arthritis pain, takes Lyrica. Otherwise she denies any worse pain in her legs with walking.  She states she cannot walk much due to shortness of breath.   The patient denies any history of TIA or stroke symptoms, specifically she denies a history of amaurosis fugax or monocular blindness, unilateral facial drooping, hemiplegia, or receptive or expressive aphasia.    Diabetic: Yes, pt indicates in good control Tobacco use: former smoker, quit in 2012, started about age 70 years  Pt meds include: Statin: yes Betablocker: Yes ASA: Yes Other anticoagulants/antiplatelets: no   Current Outpatient Medications  Medication Sig Dispense Refill  . acetaminophen (TYLENOL) 650 MG CR tablet Take 650 mg by mouth every 8 (eight) hours as needed for pain.    [name] albuterol (PROVENTIL HFA;VENTOLIN HFA) 108 (90 BASE) MCG/ACT inhaler Inhale 2 puffs into the lungs every 6 (six) hours as needed for wheezing or shortness of breath.    [name] albuterol (PROVENTIL) (2.5 MG/3ML) 0.083% nebulizer solution Take 3 mLs by nebulization every 4 (four) hours as needed for wheezing.    [name] aspirin EC 81 MG tablet Take 162 mg by mouth daily.    [name] atorvastatin (LIPITOR) 20 MG tablet TAKE 1 TABLET BY MOUTH DAILY  30 tablet 3  . carvedilol (COREG) 12.5 MG tablet TAKE ONE TABLET BY MOUTH TWICE DAILY 180 tablet 1  . DIGOX 125 MCG tablet TAKE 1/2 TABLET BY MOUTH ONCE DAILY ON MONDAYS, WEDNESDAYS, AND FRIDAYS 27 tablet 6  . fluticasone furoate-vilanterol (BREO ELLIPTA) 200-25 MCG/INH AEPB Inhale 1 puff into the lungs daily.    [name] glimepiride (AMARYL) 2 MG tablet Take 2 mg by mouth daily.    [name] losartan (COZAAR) 25 MG tablet Take 1 tablet (25 mg total) by mouth at bedtime. 30 tablet 3  . nystatin (MYCOSTATIN) 100000 UNIT/ML suspension Take 5 mLs (500,000 Units total) by mouth 4 (four) times daily. Swish in the mouth for a couple of mins prior to swallowing. 200 mL 0  . OXcarbazepine (TRILEPTAL) 150 MG tablet Take 150 mg by mouth 2 (two) times daily.    . pregabalin (LYRICA) 150 MG capsule Take 150 mg by mouth 2 (two) times daily.    [name] spironolactone (ALDACTONE) 25 MG tablet Take 0.5 tablets (12.5 mg total) by mouth daily. 15 tablet 3  . torsemide (DEMADEX) 20 MG tablet Take 3 tablets (60 mg total) by mouth daily. Take extra tablet once weekly AS NEEDED for weight 178 lbs or more. 90 tablet 6   No current facility-administered medications for this visit.     Past Medical History:  Diagnosis Date  . AICD (automatic cardioverter/defibrillator) present 2014   Biotroniks  . Arthritis    knees with arthritis as well as has back,  pt. remarks that her hands fall asleep when she is in the bed or in certain positions   . Bell's palsy   . Carotid artery disease ([place])    a. duplex - moderate bilateral carotid disease (60-79% 06/2017).  . Chronic systolic CHF (congestive heart failure) ([place])   . COPD (chronic obstructive pulmonary disease) ([place])   . Diabetes mellitus without complication (HCC)    borderline  . Former tobacco use   . GERD (gastroesophageal reflux disease)    uses otc for occas. heartburn   . Hyperlipidemia   . Hypertension   . Mild CAD    a. nonobstructive CAD (50% PLOM 08/2017).  . Neuropathy    . Non-ischemic cardiomyopathy ([place])   . PAD (peripheral artery disease) ([place])    a.  s/p L-R fem-fem BPG 2016.  [name] Pneumonia    hosp. in [place]-2015  . Subclavian steal syndrome    a. h/o left subclavian steal.    Social History Social History   Tobacco Use  . Smoking status: Former Smoker    Types: Cigarettes    Last attempt to quit: 11/14/2010    Years since quitting: 7.1  . Smokeless tobacco: Never Used  Substance Use Topics  . Alcohol use: No    Alcohol/week: 0.0 oz  . Drug use: No    Family History Family History  Problem Relation Age of Onset  . Heart disease Mother   . Hypertension Mother     Surgical History Past Surgical History:  Procedure Laterality Date  . ABDOMINAL AORTAGRAM N/A 11/27/2014   Procedure: ABDOMINAL Maxcine Ham;  Surgeon: Elam Dutch, MD;  Location: [facility] CATH LAB;  Service: Cardiovascular;  Laterality: N/A;  . ABDOMINAL HYSTERECTOMY    . APPENDECTOMY    . CARDIAC CATHETERIZATION     02/20/13: Normal coronaries, moderate LV dysfunction, EF 35%. Medical RX ([facility])  . CARDIAC DEFIBRILLATOR PLACEMENT  Aug. 2014   dual chamber [employer] ICD 05/06/13 ([facility], Dr. Minna Merritts)  . ENDARTERECTOMY FEMORAL Right 12/21/2014   Procedure: ENDARTERECTOMY FEMORAL;  Surgeon: Elam Dutch, MD;  Location: [facility];  Service: Vascular;  Laterality: Right;  . FEMORAL-FEMORAL BYPASS GRAFT Bilateral 12/21/2014   Procedure: BYPASS GRAFT LEFT FEMORAL-RIGHT FEMORAL ARTERY;  Surgeon: Elam Dutch, MD;  Location: [facility];  Service: Vascular;  Laterality: Bilateral;  . JOINT REPLACEMENT Bilateral    implants  . RIGHT/LEFT HEART CATH AND CORONARY ANGIOGRAPHY N/A 08/28/2017   Procedure: RIGHT/LEFT HEART CATH AND CORONARY ANGIOGRAPHY;  Surgeon: Larey Dresser, MD;  Location: [facility] CV LAB;  Service: Cardiovascular;  Laterality: N/A;  . TUBAL LIGATION      Allergies  Allergen Reactions  . Oxycodone Shortness Of Breath  . Entresto [Sacubitril-Valsartan] Other (See  Comments)    Impaired coordination---causes patient to drop many things, blood pressure bottomed out    Current Outpatient Medications  Medication Sig Dispense Refill  . acetaminophen (TYLENOL) 650 MG CR tablet Take 650 mg by mouth every 8 (eight) hours as needed for pain.    [name] albuterol (PROVENTIL HFA;VENTOLIN HFA) 108 (90 BASE) MCG/ACT inhaler Inhale 2 puffs into the lungs every 6 (six) hours as needed for wheezing or shortness of breath.    [name] albuterol (PROVENTIL) (2.5 MG/3ML) 0.083% nebulizer solution Take 3 mLs by nebulization every 4 (four) hours as needed for wheezing.    [name] aspirin EC 81 MG tablet Take 162 mg by mouth daily.    [name] atorvastatin (LIPITOR) 20 MG tablet TAKE 1 TABLET BY MOUTH  DAILY 30 tablet 3  . carvedilol (COREG) 12.5 MG tablet TAKE ONE TABLET BY MOUTH TWICE DAILY 180 tablet 1  . DIGOX 125 MCG tablet TAKE 1/2 TABLET BY MOUTH ONCE DAILY ON MONDAYS, WEDNESDAYS, AND FRIDAYS 27 tablet 6  . fluticasone furoate-vilanterol (BREO ELLIPTA) 200-25 MCG/INH AEPB Inhale 1 puff into the lungs daily.    [name] glimepiride (AMARYL) 2 MG tablet Take 2 mg by mouth daily.    [name] losartan (COZAAR) 25 MG tablet Take 1 tablet (25 mg total) by mouth at bedtime. 30 tablet 3  . nystatin (MYCOSTATIN) 100000 UNIT/ML suspension Take 5 mLs (500,000 Units total) by mouth 4 (four) times daily. Swish in the mouth for a couple of mins prior to swallowing. 200 mL 0  . OXcarbazepine (TRILEPTAL) 150 MG tablet Take 150 mg by mouth 2 (two) times daily.    . pregabalin (LYRICA) 150 MG capsule Take 150 mg by mouth 2 (two) times daily.    [name] spironolactone (ALDACTONE) 25 MG tablet Take 0.5 tablets (12.5 mg total) by mouth daily. 15 tablet 3  . torsemide (DEMADEX) 20 MG tablet Take 3 tablets (60 mg total) by mouth daily. Take extra tablet once weekly AS NEEDED for weight 178 lbs or more. 90 tablet 6   No current facility-administered medications for this visit.      REVIEW OF SYSTEMS: See HPI for pertinent positives  and negatives.  Physical Examination Vitals:   01/03/18 1142 01/03/18 1144  BP: (!) 78/52 110/69  Pulse: 60   Temp: (!) 96.7 F (35.9 C)   TempSrc: Oral   SpO2: 97%   Weight: 178 lb (80.7 kg)   Height: 5\' 5"  (1.651 m)    Body mass index is 29.62 kg/m.  General:  WDWN female in NAD Gait: Normal HENT: Grossly normal.  Eyes: PERRLA Pulmonary: normal non-labored breathing, diminished breath sounds in all fields, moist cough Cardiac: RRR, no murmurs detected Abdomen: soft, NT, no palpable masses Skin: no rashes, no ulcers no gangrene, no cellulitis.   VASCULAR EXAM  Carotid Bruits Left Right   Negative Positive    VASCULAR EXAM: Right radial pulse is 1+ palpable, left radial pulse is not palpable, left brachial pulse is faintly palpable.  Extremitieswithout ischemic changes  without Gangrene; without open wounds. Fem-fem bypass pulse is palpable.       LE Pulses LEFT RIGHT   FEMORAL 1+palpable, obese abdomen 1+ palpable, obese abdomen    POPLITEAL not palpable  not palpable   POSTERIOR TIBIAL not palpable  not palpable    DORSALIS PEDIS  ANTERIOR TIBIAL not palpable  not palpable    Musculoskeletal: no muscle wasting or atrophy; no peripheral edema. Neurologic:  A&O X 3; appropriate affect, sensation is normal; speech is normal, CN 2-12 intact, pain and light touch intact in extremities, motor exam as listed above. Psychiatric: Normal thought content, mood appropriate to clinical situation.    ASSESSMENT:  Lisa [name] is a 72 y.o. female who is s/pleft to right femoral-femoral bypass, right femoral endarterectomy on 12/21/14. She also has known extracranial carotid artery stenosis. She has no hx of stroke or TIA.   Fem-fem bypass graft  pulse is palpable. Bilateral femoral pulses are 1+ palpable. There are no signs of ischemia in her feet or legs.    Right radial pulse is 1+ palpable, left radial pulse is not palpable, left brachial pulse is faintly palpable.  Left radial pressure is >20 mm Hg less than right. Therefore, the right arm has  the accurate pressure.  Left subclavian stenosis, asymptomatic.    She does not seem to walk enough to elicit claudication sx's as her walking is limited by dyspnea, painful neuropathy, and arthritis pain; nevertheless, she exercises daily on a stationary bike and other exercises.   Thyroid nodule noted right lobe on carotid duplex today. Pt states she knows about these nodules, states needle bx done years ago. I advised her to follow up with her PCP re this, possible referral to endocrinologist for re-evaluation.    DATA  Carotid Duplex (01/03/18): Right ICA: 40-59% stenosis Left ICA: 60-79% stenosis Right vertebral flow is antegrade, left is retrograde Right subclavian artery waveforms are normal, left are stenotic Right ICA stenosis seem less than the previous study on 06-28-17, but same as the study on 12-21-16.   ABI (Date: 01/03/2018):  R:   ABI: 0.68 (was 0.86 on 12-21-16),   PT: bi  DP: mono  TBI:  0.52 (was 0.94)  L:   ABI: 0.66 (was 0.76),   PT: mono  DP: mono  TBI: 0.54 (was 0.84)  Decline in bilateral ABI and TBI.    PLAN:   Based on today's exam and non-invasive vascular lab results, the patient will follow up in 6 months with the following tests: carotid duplex and ABI's.  Graduated walking program discussed and how to achieve. I advised pt to notify us if she develops concerns re the circulation in her feet, legs, or hands.    I discussed in depth with the patient the nature of atherosclerosis, and emphasized the importance of maximal medical management including strict control of blood pressure, blood glucose, and lipid levels, obtaining  regular exercise, and cessation of smoking.  The patient is aware that without maximal medical management the underlying atherosclerotic disease process will progress, limiting the benefit of any interventions.  The patient was given information about stroke prevention and what symptoms should prompt the patient to seek immediate medical care.  The patient was given information about PAD including signs, symptoms, treatment, what symptoms should prompt the patient to seek immediate medical care, and risk reduction measures to take.  Thank you for allowing [place] to participate in this patient's care.  Clemon Chambers, RN, MSN, FNP-C [facility] Office: [phone]  Clinic MD: [facility] 01/03/2018 11:51 AM

## 2018-02-04 ENCOUNTER — Other Ambulatory Visit: Payer: Self-pay

## 2018-02-04 DIAGNOSIS — I779 Disorder of arteries and arterioles, unspecified: Secondary | ICD-10-CM

## 2018-02-04 DIAGNOSIS — I6523 Occlusion and stenosis of bilateral carotid arteries: Secondary | ICD-10-CM

## 2018-02-12 ENCOUNTER — Other Ambulatory Visit: Payer: Self-pay

## 2018-02-12 ENCOUNTER — Encounter (HOSPITAL_COMMUNITY): Payer: Self-pay | Admitting: Cardiology

## 2018-02-12 ENCOUNTER — Ambulatory Visit (HOSPITAL_COMMUNITY)
Admission: RE | Admit: 2018-02-12 | Discharge: 2018-02-12 | Disposition: A | Discharge status: Home / Self Care | Payer: Medicare Other | Source: Ambulatory Visit | Attending: Cardiology | Admitting: Cardiology

## 2018-02-12 VITALS — BP 110/52 | HR 61 | Wt 184.2 lb

## 2018-02-12 DIAGNOSIS — E859 Amyloidosis, unspecified: Secondary | ICD-10-CM

## 2018-02-12 DIAGNOSIS — Z79899 Other long term (current) drug therapy: Secondary | ICD-10-CM | POA: Diagnosis not present

## 2018-02-12 DIAGNOSIS — I6529 Occlusion and stenosis of unspecified carotid artery: Secondary | ICD-10-CM | POA: Diagnosis not present

## 2018-02-12 DIAGNOSIS — Z87891 Personal history of nicotine dependence: Secondary | ICD-10-CM | POA: Diagnosis not present

## 2018-02-12 DIAGNOSIS — I779 Disorder of arteries and arterioles, unspecified: Secondary | ICD-10-CM

## 2018-02-12 DIAGNOSIS — I429 Cardiomyopathy, unspecified: Secondary | ICD-10-CM | POA: Insufficient documentation

## 2018-02-12 DIAGNOSIS — I5022 Chronic systolic (congestive) heart failure: Secondary | ICD-10-CM | POA: Insufficient documentation

## 2018-02-12 DIAGNOSIS — Z7984 Long term (current) use of oral hypoglycemic drugs: Secondary | ICD-10-CM | POA: Insufficient documentation

## 2018-02-12 DIAGNOSIS — E785 Hyperlipidemia, unspecified: Secondary | ICD-10-CM | POA: Diagnosis not present

## 2018-02-12 DIAGNOSIS — I739 Peripheral vascular disease, unspecified: Secondary | ICD-10-CM | POA: Insufficient documentation

## 2018-02-12 DIAGNOSIS — I5042 Chronic combined systolic (congestive) and diastolic (congestive) heart failure: Secondary | ICD-10-CM

## 2018-02-12 DIAGNOSIS — Z7982 Long term (current) use of aspirin: Secondary | ICD-10-CM | POA: Insufficient documentation

## 2018-02-12 DIAGNOSIS — J449 Chronic obstructive pulmonary disease, unspecified: Secondary | ICD-10-CM | POA: Diagnosis not present

## 2018-02-12 LAB — DIGOXIN LEVEL: Digoxin Level: <0.2 ng/mL — ABNORMAL LOW (ref 0.8–2.0)

## 2018-02-12 LAB — BASIC METABOLIC PANEL
ANION GAP: 8 (ref 5–15)
BUN: 25 mg/dL — AB (ref 6–20)
CHLORIDE: 102 mmol/L (ref 101–111)
CO2: 30 mmol/L (ref 22–32)
Calcium: 9.3 mg/dL (ref 8.9–10.3)
Creatinine, Ser: 1.46 mg/dL — ABNORMAL HIGH (ref 0.44–1.00)
GFR calc Af Amer: 41 mL/min — ABNORMAL LOW (ref >60)
GFR calc non Af Amer: 35 mL/min — ABNORMAL LOW (ref >60)
GLUCOSE: 210 mg/dL — AB (ref 65–99)
POTASSIUM: 4.2 mmol/L (ref 3.5–5.1)
Sodium: 140 mmol/L (ref 135–145)

## 2018-02-12 MED ORDER — SPIRONOLACTONE 25 MG PO TABS: 25.0000 mg | ORAL_TABLET | Freq: Every day | ORAL | 3 refills | Status: DC

## 2018-02-12 MED ORDER — TORSEMIDE 20 MG PO TABS: 80.0000 mg | ORAL_TABLET | Freq: Every day | ORAL | 6 refills | Status: DC

## 2018-02-12 NOTE — Progress Notes (Signed)
PCP: Dr. Loistine Simas Cardiology: Dr. Geraldo Pitter HF Cardiology: Dr. Aundra Dubin  72 yo with history of PAD, COPD, carotid stenosis, and chronic systolic CHF was referred by Dr. Geraldo Pitter for evaluation/treatment of CHF.    Patient has a long history of vascular disease.  She had left-right fem-fem bypass in 3/16. She has moderate bilateral carotid stenosis.  Echo in 2/18 showed EF 20-25%.  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  She was admitted in 9/18 to the hospital in [facility] for about 4 days with acute on chronic systolic CHF.    RHC/LHC in 12/18 showed nonobstructive coronary disease, low filling pressures, and CI 2.0.   Admitted 09/20/17 with volume overload. Diuresed with IV lasix. Weight went down 2 pounds. Discharged on torsemide 40 mg daily. Discharge weight 179 pounds.   Echo in 12/18 with EF 20-25%, mild basal ventricular hypertrophy, severe mid to apical LV hypertrophy.  Repeat echo in 2/19 was done with Definity contrast, showing that what was thought to be apical hypertrophy was actually prominent mid-apical trabeculation suggestive of noncompaction.   She was noted to have Bence-Jones protein on UPEP.  However, SPEP was negative and 24 hours UPEP was also negative. Abdominal fat pad biopsy did not show evidence of amyloidosis.  She saw heme-onc, probably does not have AL amyloidosis.  However, in 2/19, she had TcPYP scan that was strongly suggestive of transthyretin amyloidosis. She has peripheral neuropathy that may be due to amyloidosis as well.   She returns for followup of CHF. Weight is up on our scale today.  She says she at a lot of high sodium foods over the weekend.  She overall feels pretty good.  She still has bilateral neuropathy (pain and tingling) in her feet and arthritis in her knees. She does not report typical exertional leg pain for PAD though she had low ABIs in 4/19.  No dyspnea walking short distances.  Does not walk very far due to  neuropathy and arthritis.  [facility].  No orthopnea/PND.   Labs (8/18): LDL 80, HDL 46 Labs (10/18): K 4.2, creatinine 1.1 Labs (11/18): digoxin 0.3 Labs (12/18): K 3.5, creatinine 1.02, UPEP with faint Bence-Jones protein but 24 hour UPEP negative and SPEP negative.  Labs (1/19): LDL 79, HDL 38, BNP 493, hgb 13.6, K 5.8 => 4.8, creatinine 1.18, digoxin 0.3 Labs (2/19): K 3.3, creatinine 1.06 Labs (3/19): digoxin < 0.2 Labs (4/19): K 5, creatinine 0.97  PMH: 1. PAD: L=>R fem-fem bypass in 3/16.   - ABIs (3/18): Mildly abnormal, 0.81 left and 0.85 right.  - ABIs (4/19): 0.68 left and 0.66 right 2. H/o appendectomy 3. COPD: Quit smoking in 2/12.  - High resolution CT chest (2/19): No interstitial lung disease, +emphysema.  4. Arthritis: Bilateral TKR 5. Diet-controlled diabetes 6. Hyperlipidemia 7. Carotid stenosis:  - 10/18 Carotid dopplers: 60-79% RICA stenosis, 43-32% LICA stenosis, left subclavian steal.  - 4/19 Carotid dopplers: 95-18% RICA, 84-16% LICA, left subclavian stenosis.  8. Chronic systolic CHF: Echo (6/06) with EF 20-25%, mild LV dilation, normal RV size and systolic function. 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 .  - LHC (10/17): Nonobstructive CAD.  - Biotronik ICD.  - LHC/RHC (12/18): 50% proximal PLOM (no obstructive disease). Mean RA 1, PA 32/10 mean 19, mean PCWP 7, CI 2.0.  - Echo (12/18): EF 20-25%, mild basal LV hypertrophy, severe mid to apical LV hypertrophy, moderate diastolic dysfunction, mild MR,  PASP 42 mmHg.  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  - Workup appears negative for AL amyloidosis.  Negative abdominal fat pad biopsy.  However, PYP scan was suggestive of transthyretin amyloidosis.  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  10. Peripheral neuropathy: ?due to amyloidosis.  11. Gout 12. H/o Bell's palsy  SH: Lives with husband in [place], 3 daughters.  Quit smoking in 2012.    Family History  Problem Relation Age of Onset  . Heart disease Mother   . Hypertension Mother   Daughter with cardiomyopathy. Nephews with CAD.   ROS: All systems reviewed and negative except as per HPI.   Current Outpatient Medications  Medication Sig Dispense Refill  . acetaminophen (TYLENOL) 650 MG CR tablet Take 650 mg by mouth every 8 (eight) hours as needed for pain.    [name] albuterol (PROVENTIL HFA;VENTOLIN HFA) 108 (90 BASE) MCG/ACT inhaler Inhale 2 puffs into the lungs every 6 (six) hours as needed for wheezing or shortness of breath.    [name] albuterol (PROVENTIL) (2.5 MG/3ML) 0.083% nebulizer solution Take 3 mLs by nebulization every 4 (four) hours as needed for wheezing.    [name] aspirin EC 81 MG tablet Take 162 mg by mouth daily.    [name] atorvastatin (LIPITOR) 20 MG tablet TAKE 1 TABLET BY MOUTH DAILY 30 tablet 3  . carvedilol (COREG) 12.5 MG tablet TAKE ONE TABLET BY MOUTH TWICE DAILY 180 tablet 1  . DIGOX 125 MCG tablet TAKE 1/2 TABLET BY MOUTH ONCE DAILY ON MONDAYS, WEDNESDAYS, AND FRIDAYS 27 tablet 6  . fluticasone furoate-vilanterol (BREO ELLIPTA) 200-25 MCG/INH AEPB Inhale 1 puff into the lungs daily.    [name] glimepiride (AMARYL) 2 MG tablet Take 2 mg by mouth daily.    [name] losartan (COZAAR) 25 MG tablet Take 1 tablet (25 mg total) by mouth at bedtime. 30 tablet 3  . nystatin (MYCOSTATIN) 100000 UNIT/ML suspension Take 5 mLs (500,000 Units total) by mouth 4 (four) times daily. Swish in the mouth for a couple of mins prior to swallowing. 200 mL 0  . OXcarbazepine (TRILEPTAL) 150 MG tablet Take 150 mg by mouth 2 (two) times daily.    . pregabalin (LYRICA) 150 MG capsule Take 150 mg by mouth 2 (two) times daily.    [name] spironolactone (ALDACTONE) 25 MG tablet Take 1 tablet (25 mg total) by mouth daily.  30 tablet 3  . torsemide (DEMADEX) 20 MG tablet Take 4 tablets (80 mg total) by mouth daily. Take extra tablet once weekly AS NEEDED for weight 178 lbs or more. 124 tablet 6   No current facility-administered medications for this encounter.    BP (!) 110/52   Pulse 61   Wt 184 lb 4 oz (83.6 kg)   SpO2 94%   BMI 30.66 kg/m   General: NAD Neck: JVP 8 cm with HJR, no thyromegaly or thyroid nodule.  Lungs: Clear to auscultation bilaterally with normal respiratory effort. CV: Nondisplaced PMI.  Heart regular S1/S2, no S3/S4, 1/6 SEM RUSB.  1+ edema 1/2 up lower legs bilaterally. Soft bilateral carotid bruits.  Normal pedal pulses.  Abdomen: Soft, nontender, no hepatosplenomegaly, no distention.  Skin: Intact without lesions or rashes.  Neurologic: Alert and oriented x 3.  Psych: Normal affect. Extremities: No clubbing or cyanosis.  HEENT: Normal.   Assessment/Plan: 1. Chronic systolic CHF: Nonischemic cardiomyopathy.  EF 20-25% on echo in 2/18.  LHC/RHC in 12/18 with low filling pressures, CI 2.0, and nonobstructive CAD. She has a Biotronik ICD.  Also of note, daughter and nephew have sarcoidosis => patient had a chest CT in 2/19 without evidence for pulmonary sarcoidosis.  We are unable to do an MRI to look for infiltrative disease due to ICD.  Echo in 12/18 showed EF 20-25%, noted to have LVH pattern that is mild in the basal segments and moderate to severe in the apical segments. This was concerning for apical HCM and study was repeated in 2/19 with Definity contrast.  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 We also did a workup for amyloidosis. She did not have evidence for AL amyloidosis, but TcPYP scan in 2/19 was strongly suggestive of transthyretin amyloidosis.  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 She is mildly volume  overloaded on exam, NYHA class III probably.   - Continue losartan 25 mg qhs. She did not tolerate low dose Entresto.  - Increase spironolactone to 25 mg daily.  BMET today and again in 10 days.  - Continue digoxin, check digoxin level.  - Continue Coreg 12.5 mg bid.  - Increase torsemide to 80 mg daily.     2. PAD: s/p fem-fem bypass, followed by [facility].  No claudication, last ABIs somewhat worse in 4/19. She does not have clear claudication, pain in legs seems to be related to peripheral neuropathy and arthritis.  Continue to followup with [facility].  3. Carotid stenosis: Stable moderate stenosis on doppers. Follows with [facility].  4. Hyperlipidemia: Continue atorvastatin. Recent lipids were acceptable.  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  - Will send genetic testing to determine wild-type versus familial. She has a peripheral neuropathy.  If familial, would consider patisiran treatment versus tafamidis.  If wild type, tafamidis is an option.   Followup in 1 month.   Loralie Champagne 02/12/2018

## 2018-02-12 NOTE — Patient Instructions (Addendum)
Increase Torsemide 80 mg (4 tabs) daily  Increase Spironolactone 25 mg (1 tab) daily  Labs drawn today (if we do not call you, then your lab work was stable)   Your physician recommends that you return for lab work in: 10 days Rx given   Your physician recommends that you schedule a follow-up appointment in: 1 month with Dr. Aundra Dubin

## 2018-02-13 ENCOUNTER — Ambulatory Visit (INDEPENDENT_AMBULATORY_CARE_PROVIDER_SITE_OTHER): Payer: Medicare Other | Admitting: *Deleted

## 2018-02-13 DIAGNOSIS — I5022 Chronic systolic (congestive) heart failure: Secondary | ICD-10-CM | POA: Diagnosis not present

## 2018-02-13 NOTE — Progress Notes (Signed)
Remote ICD transmission.   

## 2018-02-14 LAB — CUP PACEART REMOTE DEVICE CHECK
Implantable Lead Implant Date: 20140812
Implantable Lead Location: 753859
Implantable Lead Model: 375
Implantable Lead Serial Number: 10491693
Implantable Pulse Generator Implant Date: 20140812
MDC IDC LEAD IMPLANT DT: 20140812
MDC IDC LEAD LOCATION: 753860
MDC IDC LEAD SERIAL: 29451060
MDC IDC SESS DTM: 20190523153232
Pulse Gen Model: 383562
Pulse Gen Serial Number: 60721404

## 2018-03-01 ENCOUNTER — Other Ambulatory Visit: Payer: Self-pay | Admitting: Cardiology

## 2018-03-02 LAB — BASIC METABOLIC PANEL
BUN/Creatinine Ratio: 31 — ABNORMAL HIGH (ref 12–28)
BUN: 37 mg/dL — ABNORMAL HIGH (ref 8–27)
CHLORIDE: 101 mmol/L (ref 96–106)
CO2: 26 mmol/L (ref 20–29)
CREATININE: 1.18 mg/dL — AB (ref 0.57–1.00)
Calcium: 9.8 mg/dL (ref 8.7–10.3)
GFR calc non Af Amer: 47 mL/min/{1.73_m2} — ABNORMAL LOW (ref >59)
GFR, EST AFRICAN AMERICAN: 54 mL/min/{1.73_m2} — AB (ref >59)
GLUCOSE: 98 mg/dL (ref 65–99)
Potassium: 4.4 mmol/L (ref 3.5–5.2)
SODIUM: 139 mmol/L (ref 134–144)

## 2018-03-14 ENCOUNTER — Other Ambulatory Visit: Payer: Self-pay | Admitting: Cardiology

## 2018-03-18 ENCOUNTER — Inpatient Hospital Stay (HOSPITAL_COMMUNITY)
Admission: EM | Admit: 2018-03-18 | Discharge: 2018-03-20 | DRG: 194 | Disposition: A | Discharge status: Home / Self Care | Payer: Medicare Other | Attending: Family Medicine | Admitting: Family Medicine

## 2018-03-18 ENCOUNTER — Emergency Department (HOSPITAL_COMMUNITY): Payer: Medicare Other

## 2018-03-18 ENCOUNTER — Other Ambulatory Visit: Payer: Self-pay

## 2018-03-18 ENCOUNTER — Encounter (HOSPITAL_COMMUNITY): Payer: Self-pay | Admitting: Emergency Medicine

## 2018-03-18 DIAGNOSIS — J181 Lobar pneumonia, unspecified organism: Secondary | ICD-10-CM

## 2018-03-18 DIAGNOSIS — Z95810 Presence of automatic (implantable) cardiac defibrillator: Secondary | ICD-10-CM

## 2018-03-18 DIAGNOSIS — E785 Hyperlipidemia, unspecified: Secondary | ICD-10-CM | POA: Diagnosis present

## 2018-03-18 DIAGNOSIS — R0902 Hypoxemia: Secondary | ICD-10-CM

## 2018-03-18 DIAGNOSIS — E1122 Type 2 diabetes mellitus with diabetic chronic kidney disease: Secondary | ICD-10-CM | POA: Diagnosis present

## 2018-03-18 DIAGNOSIS — M1990 Unspecified osteoarthritis, unspecified site: Secondary | ICD-10-CM | POA: Diagnosis present

## 2018-03-18 DIAGNOSIS — Z7984 Long term (current) use of oral hypoglycemic drugs: Secondary | ICD-10-CM

## 2018-03-18 DIAGNOSIS — K219 Gastro-esophageal reflux disease without esophagitis: Secondary | ICD-10-CM | POA: Diagnosis present

## 2018-03-18 DIAGNOSIS — Z885 Allergy status to narcotic agent status: Secondary | ICD-10-CM

## 2018-03-18 DIAGNOSIS — Z87891 Personal history of nicotine dependence: Secondary | ICD-10-CM

## 2018-03-18 DIAGNOSIS — I959 Hypotension, unspecified: Secondary | ICD-10-CM | POA: Diagnosis present

## 2018-03-18 DIAGNOSIS — I130 Hypertensive heart and chronic kidney disease with heart failure and stage 1 through stage 4 chronic kidney disease, or unspecified chronic kidney disease: Secondary | ICD-10-CM | POA: Diagnosis present

## 2018-03-18 DIAGNOSIS — J440 Chronic obstructive pulmonary disease with acute lower respiratory infection: Secondary | ICD-10-CM | POA: Diagnosis present

## 2018-03-18 DIAGNOSIS — I081 Rheumatic disorders of both mitral and tricuspid valves: Secondary | ICD-10-CM | POA: Diagnosis present

## 2018-03-18 DIAGNOSIS — J159 Unspecified bacterial pneumonia: Secondary | ICD-10-CM | POA: Diagnosis present

## 2018-03-18 DIAGNOSIS — I272 Pulmonary hypertension, unspecified: Secondary | ICD-10-CM | POA: Diagnosis present

## 2018-03-18 DIAGNOSIS — J441 Chronic obstructive pulmonary disease with (acute) exacerbation: Secondary | ICD-10-CM

## 2018-03-18 DIAGNOSIS — I2510 Atherosclerotic heart disease of native coronary artery without angina pectoris: Secondary | ICD-10-CM | POA: Diagnosis present

## 2018-03-18 DIAGNOSIS — I444 Left anterior fascicular block: Secondary | ICD-10-CM | POA: Diagnosis present

## 2018-03-18 DIAGNOSIS — N183 Chronic kidney disease, stage 3 (moderate): Secondary | ICD-10-CM | POA: Diagnosis present

## 2018-03-18 DIAGNOSIS — I43 Cardiomyopathy in diseases classified elsewhere: Secondary | ICD-10-CM | POA: Diagnosis present

## 2018-03-18 DIAGNOSIS — Z7982 Long term (current) use of aspirin: Secondary | ICD-10-CM

## 2018-03-18 DIAGNOSIS — I428 Other cardiomyopathies: Secondary | ICD-10-CM | POA: Diagnosis present

## 2018-03-18 DIAGNOSIS — I5042 Chronic combined systolic (congestive) and diastolic (congestive) heart failure: Secondary | ICD-10-CM | POA: Diagnosis present

## 2018-03-18 DIAGNOSIS — E1151 Type 2 diabetes mellitus with diabetic peripheral angiopathy without gangrene: Secondary | ICD-10-CM | POA: Diagnosis present

## 2018-03-18 DIAGNOSIS — J189 Pneumonia, unspecified organism: Secondary | ICD-10-CM | POA: Diagnosis present

## 2018-03-18 DIAGNOSIS — Z888 Allergy status to other drugs, medicaments and biological substances status: Secondary | ICD-10-CM

## 2018-03-18 DIAGNOSIS — G40909 Epilepsy, unspecified, not intractable, without status epilepticus: Secondary | ICD-10-CM | POA: Diagnosis present

## 2018-03-18 DIAGNOSIS — E854 Organ-limited amyloidosis: Secondary | ICD-10-CM | POA: Diagnosis present

## 2018-03-18 DIAGNOSIS — Z79899 Other long term (current) drug therapy: Secondary | ICD-10-CM

## 2018-03-18 LAB — CBC
HCT: 42.3 % (ref 36.0–46.0)
HEMOGLOBIN: 13.4 g/dL (ref 12.0–15.0)
MCH: 30.9 pg (ref 26.0–34.0)
MCHC: 31.7 g/dL (ref 30.0–36.0)
MCV: 97.7 fL (ref 78.0–100.0)
Platelets: 258 10*3/uL (ref 150–400)
RBC: 4.33 MIL/uL (ref 3.87–5.11)
RDW: 13.8 % (ref 11.5–15.5)
WBC: 13.4 10*3/uL — ABNORMAL HIGH (ref 4.0–10.5)

## 2018-03-18 LAB — URINALYSIS, ROUTINE W REFLEX MICROSCOPIC
BILIRUBIN URINE: NEGATIVE
Glucose, UA: NEGATIVE mg/dL
Hgb urine dipstick: NEGATIVE
Ketones, ur: NEGATIVE mg/dL
NITRITE: NEGATIVE
PH: 5 (ref 5.0–8.0)
Protein, ur: NEGATIVE mg/dL
SPECIFIC GRAVITY, URINE: 1.012 (ref 1.005–1.030)

## 2018-03-18 LAB — I-STAT CG4 LACTIC ACID, ED: LACTIC ACID, VENOUS: 1.84 mmol/L (ref 0.5–1.9)

## 2018-03-18 LAB — BASIC METABOLIC PANEL
Anion gap: 12 (ref 5–15)
BUN: 24 mg/dL — AB (ref 6–20)
CALCIUM: 9.4 mg/dL (ref 8.9–10.3)
CO2: 31 mmol/L (ref 22–32)
Chloride: 97 mmol/L — ABNORMAL LOW (ref 101–111)
Creatinine, Ser: 1.38 mg/dL — ABNORMAL HIGH (ref 0.44–1.00)
GFR calc Af Amer: 44 mL/min — ABNORMAL LOW (ref >60)
GFR, EST NON AFRICAN AMERICAN: 38 mL/min — AB (ref >60)
GLUCOSE: 125 mg/dL — AB (ref 65–99)
POTASSIUM: 4.5 mmol/L (ref 3.5–5.1)
SODIUM: 140 mmol/L (ref 135–145)

## 2018-03-18 LAB — I-STAT TROPONIN, ED
TROPONIN I, POC: 0.02 ng/mL (ref 0.00–0.08)
Troponin i, poc: 0 ng/mL (ref 0.00–0.08)

## 2018-03-18 LAB — GLUCOSE, CAPILLARY: Glucose-Capillary: 222 mg/dL — ABNORMAL HIGH (ref 65–99)

## 2018-03-18 LAB — BRAIN NATRIURETIC PEPTIDE: B Natriuretic Peptide: 415.9 pg/mL — ABNORMAL HIGH (ref 0.0–100.0)

## 2018-03-18 MED ORDER — OFLOXACIN 0.3 % OP SOLN
1.0000 [drp] | Freq: Four times a day (QID) | OPHTHALMIC | Status: DC
  Administered 2018-03-18 – 2018-03-20 (×5): 1 [drp] via OPHTHALMIC
  Filled 2018-03-18: qty 5

## 2018-03-18 MED ORDER — ACETAMINOPHEN 325 MG PO TABS: 650.0000 mg | ORAL_TABLET | Freq: Four times a day (QID) | ORAL | Status: DC | PRN

## 2018-03-18 MED ORDER — IPRATROPIUM-ALBUTEROL 0.5-2.5 (3) MG/3ML IN SOLN
3.0000 mL | Freq: Once | RESPIRATORY_TRACT | Status: AC
  Administered 2018-03-18: 3 mL via RESPIRATORY_TRACT
  Filled 2018-03-18: qty 3

## 2018-03-18 MED ORDER — ATORVASTATIN CALCIUM 20 MG PO TABS
20.0000 mg | ORAL_TABLET | Freq: Every day | ORAL | Status: DC
  Administered 2018-03-18 – 2018-03-20 (×3): 20 mg via ORAL
  Filled 2018-03-18 (×3): qty 1

## 2018-03-18 MED ORDER — SODIUM CHLORIDE 0.9 % IV SOLN
2.0000 g | INTRAVENOUS | Status: DC
  Administered 2018-03-18: 2 g via INTRAVENOUS
  Filled 2018-03-18 (×2): qty 20

## 2018-03-18 MED ORDER — ENOXAPARIN SODIUM 40 MG/0.4ML ~~LOC~~ SOLN
40.0000 mg | SUBCUTANEOUS | Status: DC
  Administered 2018-03-18 – 2018-03-19 (×2): 40 mg via SUBCUTANEOUS
  Filled 2018-03-18 (×2): qty 0.4

## 2018-03-18 MED ORDER — ONDANSETRON HCL 4 MG PO TABS: 4.0000 mg | ORAL_TABLET | Freq: Four times a day (QID) | ORAL | Status: DC | PRN

## 2018-03-18 MED ORDER — SODIUM CHLORIDE 0.9 % IV SOLN
500.0000 mg | INTRAVENOUS | Status: DC
  Administered 2018-03-18: 500 mg via INTRAVENOUS
  Filled 2018-03-18 (×2): qty 500

## 2018-03-18 MED ORDER — ALBUTEROL SULFATE (2.5 MG/3ML) 0.083% IN NEBU
3.0000 mL | INHALATION_SOLUTION | RESPIRATORY_TRACT | Status: DC | PRN
  Filled 2018-03-18: qty 3

## 2018-03-18 MED ORDER — DIFLUPREDNATE 0.05 % OP EMUL
1.0000 [drp] | Freq: Every day | OPHTHALMIC | Status: DC
Start: 2018-03-18 — End: 2018-03-20
  Administered 2018-03-19 – 2018-03-20 (×3): 1 [drp] via OPHTHALMIC

## 2018-03-18 MED ORDER — LACTATED RINGERS IV BOLUS
500.0000 mL | Freq: Once | INTRAVENOUS | Status: AC
  Administered 2018-03-18: 500 mL via INTRAVENOUS

## 2018-03-18 MED ORDER — ACETAMINOPHEN 650 MG RE SUPP: 650.0000 mg | Freq: Four times a day (QID) | RECTAL | Status: DC | PRN

## 2018-03-18 MED ORDER — FLUTICASONE FUROATE-VILANTEROL 200-25 MCG/INH IN AEPB
1.0000 | INHALATION_SPRAY | Freq: Every day | RESPIRATORY_TRACT | Status: DC
  Administered 2018-03-19 – 2018-03-20 (×2): 1 via RESPIRATORY_TRACT
  Filled 2018-03-18: qty 28

## 2018-03-18 MED ORDER — FUROSEMIDE 10 MG/ML IJ SOLN
40.0000 mg | Freq: Once | INTRAMUSCULAR | Status: AC
  Administered 2018-03-18: 40 mg via INTRAVENOUS
  Filled 2018-03-18: qty 4

## 2018-03-18 MED ORDER — PREGABALIN 75 MG PO CAPS
150.0000 mg | ORAL_CAPSULE | Freq: Two times a day (BID) | ORAL | Status: DC
  Administered 2018-03-18 – 2018-03-20 (×4): 150 mg via ORAL
  Filled 2018-03-18 (×4): qty 2

## 2018-03-18 MED ORDER — ASPIRIN EC 81 MG PO TBEC
162.0000 mg | DELAYED_RELEASE_TABLET | Freq: Every day | ORAL | Status: DC
  Administered 2018-03-19 – 2018-03-20 (×2): 162 mg via ORAL
  Filled 2018-03-18 (×2): qty 2

## 2018-03-18 MED ORDER — OXCARBAZEPINE 150 MG PO TABS
150.0000 mg | ORAL_TABLET | Freq: Two times a day (BID) | ORAL | Status: DC
  Administered 2018-03-18 – 2018-03-20 (×4): 150 mg via ORAL
  Filled 2018-03-18 (×4): qty 1

## 2018-03-18 MED ORDER — DIGOXIN 125 MCG PO TABS
0.1250 mg | ORAL_TABLET | Freq: Every day | ORAL | Status: DC
  Administered 2018-03-20: 0.125 mg via ORAL
  Filled 2018-03-18 (×2): qty 1

## 2018-03-18 MED ORDER — IPRATROPIUM-ALBUTEROL 0.5-2.5 (3) MG/3ML IN SOLN
3.0000 mL | Freq: Three times a day (TID) | RESPIRATORY_TRACT | Status: DC
  Administered 2018-03-19: 3 mL via RESPIRATORY_TRACT
  Filled 2018-03-18: qty 3

## 2018-03-18 MED ORDER — ONDANSETRON HCL 4 MG/2ML IJ SOLN: 4.0000 mg | Freq: Four times a day (QID) | INTRAMUSCULAR | Status: DC | PRN

## 2018-03-18 MED ORDER — SPIRONOLACTONE 25 MG PO TABS: 25.0000 mg | ORAL_TABLET | Freq: Every day | ORAL | Status: DC

## 2018-03-18 MED ORDER — INSULIN ASPART 100 UNIT/ML ~~LOC~~ SOLN
0.0000 [IU] | Freq: Every day | SUBCUTANEOUS | Status: DC
  Administered 2018-03-18: 2 [IU] via SUBCUTANEOUS

## 2018-03-18 MED ORDER — CARVEDILOL 12.5 MG PO TABS
12.5000 mg | ORAL_TABLET | Freq: Two times a day (BID) | ORAL | Status: DC
  Filled 2018-03-18: qty 1

## 2018-03-18 MED ORDER — INSULIN ASPART 100 UNIT/ML ~~LOC~~ SOLN
0.0000 [IU] | Freq: Three times a day (TID) | SUBCUTANEOUS | Status: DC
  Administered 2018-03-19: 3 [IU] via SUBCUTANEOUS
  Administered 2018-03-19: 7 [IU] via SUBCUTANEOUS

## 2018-03-18 MED ORDER — PREDNISONE 50 MG PO TABS
50.0000 mg | ORAL_TABLET | Freq: Every day | ORAL | Status: DC
  Administered 2018-03-18 – 2018-03-19 (×2): 50 mg via ORAL
  Filled 2018-03-18 (×2): qty 1

## 2018-03-18 MED ORDER — LOSARTAN POTASSIUM 25 MG PO TABS
25.0000 mg | ORAL_TABLET | Freq: Every day | ORAL | Status: DC
  Administered 2018-03-18: 25 mg via ORAL
  Filled 2018-03-18: qty 1

## 2018-03-18 MED ORDER — ALBUTEROL SULFATE (2.5 MG/3ML) 0.083% IN NEBU
2.5000 mg | INHALATION_SOLUTION | RESPIRATORY_TRACT | Status: DC | PRN
  Administered 2018-03-18: 2.5 mg via RESPIRATORY_TRACT

## 2018-03-18 NOTE — ED Provider Notes (Signed)
[facility] EMERGENCY DEPARTMENT Provider Note   CSN: [id number] Arrival date & time: 03/18/18  1243 History   Chief Complaint Chief Complaint  Patient presents with  . Shortness of Breath    HPI Lisa [name] is a 72 y.o. female.  HFrEF (20-25% s/p AICD placement), COPD who presents with dyspnea, cough x 2 days. Intermittent left upper chest wall pain described as sharp, currently resolved.   The history is provided by the patient and a relative.  Shortness of Breath  This is a new problem. The average episode lasts 2 days. The problem occurs continuously.The problem has been gradually worsening. Associated symptoms include cough, sputum production, chest pain and leg swelling. Pertinent negatives include no fever, no sore throat, no ear pain, no wheezing, no vomiting, no abdominal pain and no rash. She has tried beta-agonist inhalers for the symptoms. The treatment provided mild relief. Associated medical issues include COPD and heart failure.    Past Medical History:  Diagnosis Date  . AICD (automatic cardioverter/defibrillator) present 2014   Biotroniks  . Arthritis    knees with arthritis as well as has back, pt. remarks that her hands fall asleep when she is in the bed or in certain positions   . Bell's palsy   . Carotid artery disease ([place])    a. duplex - moderate bilateral carotid disease (60-79% 06/2017).  . Chronic systolic CHF (congestive heart failure) ([place])   . COPD (chronic obstructive pulmonary disease) ([place])   . Diabetes mellitus without complication (HCC)    borderline  . Former tobacco use   . GERD (gastroesophageal reflux disease)    uses otc for occas. heartburn   . Hyperlipidemia   . Hypertension   . Mild CAD    a. nonobstructive CAD (50% PLOM 08/2017).  . Neuropathy   . Non-ischemic cardiomyopathy ([place])   . PAD (peripheral artery disease) ([place])    a.  s/p L-R fem-fem BPG 2016.  [name] Pneumonia    hosp. in [place]-2015  .  Subclavian steal syndrome    a. h/o left subclavian steal.    Patient Active Problem List   Diagnosis Date Noted  . Community acquired pneumonia 03/18/2018  . Acute on chronic systolic CHF (congestive heart failure) ([place]) 09/20/2017  . Mild CAD 09/20/2017  . COPD (chronic obstructive pulmonary disease) ([place]) 09/20/2017  . Wide-complex tachycardia ([place]) 09/20/2017  . Moderate mitral regurgitation 09/20/2017  . Moderate tricuspid regurgitation 09/20/2017  . Acute on chronic systolic (congestive) heart failure ([place]) 09/20/2017  . Acute on chronic combined systolic and diastolic CHF (congestive heart failure) ([place]) 04/09/2017  . Nonischemic cardiomyopathy ([place])  04/09/2017  . Dyslipidemia 04/09/2017  . ICD (implantable cardioverter-defibrillator) in place 04/09/2017  . Essential hypertension 04/09/2017  . Discoloration of skin of toe-Right 3rd Toe 12/31/2014  . Wound drainage-Right Groin 12/31/2014  . Right leg swelling 12/31/2014  . Pain in joint, ankle and foot 12/31/2014  . 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 Paresthesia of both hands 03/19/2014  . Numbness- Right foot 03/19/2014  . Right foot pain 03/19/2014  . Peripheral vascular disease, unspecified ([place]) 12/26/2012  . Occlusion and stenosis of carotid artery without mention of cerebral infarction 11/14/2012    Past Surgical History:  Procedure Laterality Date  . ABDOMINAL AORTAGRAM N/A 11/27/2014   Procedure: ABDOMINAL Maxcine Ham;  Surgeon: Elam Dutch, MD;  Location: [facility] CATH LAB;  Service: Cardiovascular;  Laterality: N/A;  . ABDOMINAL HYSTERECTOMY    .  APPENDECTOMY    . CARDIAC CATHETERIZATION     02/20/13: Normal coronaries, moderate LV dysfunction, EF 35%. Medical RX ([facility])  . CARDIAC DEFIBRILLATOR PLACEMENT  Aug. 2014   dual chamber [employer]ot[employer] ICD 05/06/13 ([facility], Dr. Minna Merritts)  . ENDARTERECTOMY FEMORAL Right 12/21/2014   Procedure: ENDARTERECTOMY FEMORAL;  Surgeon:  Elam Dutch, MD;  Location: [facility];  Service: Vascular;  Laterality: Right;  . FEMORAL-FEMORAL BYPASS GRAFT Bilateral 12/21/2014   Procedure: BYPASS GRAFT LEFT FEMORAL-RIGHT FEMORAL ARTERY;  Surgeon: Elam Dutch, MD;  Location: [facility];  Service: Vascular;  Laterality: Bilateral;  . JOINT REPLACEMENT Bilateral    implants  . RIGHT/LEFT HEART CATH AND CORONARY ANGIOGRAPHY N/A 08/28/2017   Procedure: RIGHT/LEFT HEART CATH AND CORONARY ANGIOGRAPHY;  Surgeon: Larey Dresser, MD;  Location: [facility] CV LAB;  Service: Cardiovascular;  Laterality: N/A;  . TUBAL LIGATION       OB History   None      Home Medications    Prior to Admission medications   Medication Sig Start Date End Date Taking? Authorizing Provider  acetaminophen (TYLENOL) 650 MG CR tablet Take 650 mg by mouth every 8 (eight) hours as needed for pain.   Yes [provider]  albuterol (PROVENTIL HFA;VENTOLIN HFA) 108 (90 BASE) MCG/ACT inhaler Inhale 2 puffs into the lungs every 6 (six) hours as needed for wheezing or shortness of breath.   Yes [provider]  albuterol (PROVENTIL) (2.5 MG/3ML) 0.083% nebulizer solution Take 3 mLs by nebulization every 4 (four) hours as needed for wheezing. 08/01/17  Yes [provider]  aspirin EC 81 MG tablet Take 162 mg by mouth daily.   Yes [provider]  atorvastatin (LIPITOR) 20 MG tablet TAKE 1 TABLET BY MOUTH DAILY 12/10/17  Yes Bensimhon, Shaune Pascal, MD  carvedilol (COREG) 12.5 MG tablet TAKE ONE (1) TABLET BY MOUTH TWO (2) TIMES DAILY 03/14/18  Yes Revankar, Reita Cliche, MD  Difluprednate 0.05 % EMUL Apply 1 drop to eye daily.   Yes [provider]  [place] 125 MCG tablet TAKE 1/2 TABLET BY MOUTH ONCE DAILY ON MONDAYS, WEDNESDAYS, AND FRIDAYS 09/17/17  Yes Revankar, Reita Cliche, MD  fluticasone furoate-vilanterol (BREO ELLIPTA) 200-25 MCG/INH AEPB Inhale 1 puff into the lungs daily.   Yes [provider]  glimepiride (AMARYL) 2 MG tablet  Take 2 mg by mouth daily. 07/11/17  Yes [provider]  losartan (COZAAR) 25 MG tablet Take 1 tablet (25 mg total) by mouth at bedtime. 10/24/17 02/13/19 Yes Larey Dresser, MD  ofloxacin (OCUFLOX) 0.3 % ophthalmic solution Place 1 drop into both eyes 4 (four) times daily.   Yes [provider]  OXcarbazepine (TRILEPTAL) 150 MG tablet Take 150 mg by mouth 2 (two) times daily. 05/14/17  Yes [provider]  potassium chloride SA (K-DUR,KLOR-CON) 20 MEQ tablet Take 20 mEq by mouth daily. 02/27/18  Yes [provider]  pregabalin (LYRICA) 150 MG capsule Take 150 mg by mouth 2 (two) times daily.   Yes [provider]  spironolactone (ALDACTONE) 25 MG tablet Take 1 tablet (25 mg total) by mouth daily. 02/12/18 05/13/18 Yes Larey Dresser, MD  torsemide (DEMADEX) 20 MG tablet Take 4 tablets (80 mg total) by mouth daily. Take extra tablet once weekly AS NEEDED for weight 178 lbs or more. Patient taking differently: Take 80 mg by mouth daily.  02/12/18  Yes Larey Dresser, MD  nystatin (MYCOSTATIN) 100000 UNIT/ML suspension Take 5 mLs (500,000 Units  total) by mouth 4 (four) times daily. Swish in the mouth for a couple of mins prior to swallowing. Patient not taking: Reported on 03/18/2018 10/12/17   Brunetta Genera, MD    Family History Family History  Problem Relation Age of Onset  . Heart disease Mother   . Hypertension Mother     Social History Social History   Tobacco Use  . Smoking status: Former Smoker    Types: Cigarettes    Last attempt to quit: 11/14/2010    Years since quitting: 7.3  . Smokeless tobacco: Never Used  Substance Use Topics  . Alcohol use: No    Alcohol/week: 0.0 oz  . Drug use: No     Allergies   Oxycodone and Entresto [sacubitril-valsartan]   Review of Systems Review of Systems  Constitutional: Positive for chills and fatigue. Negative for fever.  HENT: Negative for ear pain and sore throat.   Eyes: Negative  for pain and visual disturbance.  Respiratory: Positive for cough, sputum production and shortness of breath. Negative for wheezing.   Cardiovascular: Positive for chest pain and leg swelling. Negative for palpitations.  Gastrointestinal: Negative for abdominal pain and vomiting.  Genitourinary: Negative for dysuria and hematuria.  Musculoskeletal: Negative for arthralgias and back pain.  Skin: Negative for color change and rash.  Neurological: Negative for seizures and syncope.  All other systems reviewed and are negative.    Physical Exam Updated Vital Signs BP 108/63 (BP Location: Right Arm)   Pulse (!) 59   Temp 98.3 F (36.8 C) (Oral)   Resp 14   Ht 5\' 6"  (1.676 m)   Wt 84.1 kg (185 lb 6.5 oz)   SpO2 96%   BMI 29.93 kg/m   Physical Exam  Constitutional: She is oriented to person, place, and time. She appears well-developed and well-nourished. No distress.  HENT:  Head: Normocephalic and atraumatic.  Mouth/Throat: Oropharynx is clear and moist.  Eyes: Conjunctivae and EOM are normal.  Neck: Neck supple.  Cardiovascular: Normal rate, regular rhythm and intact distal pulses.  No murmur heard. Pulmonary/Chest: Tachypnea noted. No respiratory distress. She has rhonchi (throughout, L>R).  Abdominal: Soft. She exhibits no distension. There is no tenderness.  Musculoskeletal: She exhibits edema (1+ symmetric pitting edema to BLE). She exhibits no tenderness.  Neurological: She is alert and oriented to person, place, and time.  Skin: Skin is warm and dry.  Psychiatric: She has a normal mood and affect.  Nursing note and vitals reviewed.    ED Treatments / Results  Labs (all labs ordered are listed, but only abnormal results are displayed) Labs Reviewed  BASIC METABOLIC PANEL - Abnormal; Notable for the following components:      Result Value   Chloride 97 (*)    Glucose, Bld 125 (*)    BUN 24 (*)    Creatinine, Ser 1.38 (*)    GFR calc non Af Amer 38 (*)    GFR calc  Af Amer 44 (*)    All other components within normal limits  CBC - Abnormal; Notable for the following components:   WBC 13.4 (*)    All other components within normal limits  BRAIN NATRIURETIC PEPTIDE - Abnormal; Notable for the following components:   B Natriuretic Peptide 415.9 (*)    All other components within normal limits  URINALYSIS, ROUTINE W REFLEX MICROSCOPIC - Abnormal; Notable for the following components:   Leukocytes, UA MODERATE (*)    Bacteria, UA RARE (*)  Non Squamous Epithelial 0-5 (*)    All other components within normal limits  GLUCOSE, CAPILLARY - Abnormal; Notable for the following components:   Glucose-Capillary 222 (*)    All other components within normal limits  CULTURE, BLOOD (ROUTINE X 2)  CULTURE, BLOOD (ROUTINE X 2)  HIV ANTIBODY (ROUTINE TESTING)  COMPREHENSIVE METABOLIC PANEL  MAGNESIUM  PHOSPHORUS  CBC WITH DIFFERENTIAL/PLATELET  HEMOGLOBIN A1C  I-STAT TROPONIN, ED  I-STAT CG4 LACTIC ACID, ED  I-STAT CG4 LACTIC ACID, ED  I-STAT TROPONIN, ED    EKG EKG Interpretation  Date/Time:  Monday March 18 2018 12:52:09 EDT Ventricular Rate:  62 PR Interval:    QRS Duration: 106 QT Interval:  438 QTC Calculation: 444 R Axis:   -53 Text Interpretation:  Normal sinus rhythm Left anterior fascicular block Septal infarct , age undetermined Abnormal ECG No significant change since last tracing Confirmed by Zenovia Jarred [phone]) on 03/18/2018 1:00:47 PM   Radiology Dg Chest 2 View  Result Date: 03/18/2018 CLINICAL DATA:  Increasing shortness of breath since last night. Cough and chest congestion. EXAM: CHEST - 2 VIEW COMPARISON:  Chest x-rays dated 09/20/2017 and 07/04/2017 and chest CT dated 10/31/2017 FINDINGS: Chronic cardiomegaly. AICD in place. Pulmonary vascularity is normal. Aortic atherosclerosis. There is increased density at the left lung base. No discrete effusions. No acute bone abnormality. IMPRESSION: New increased density at the left  lung base which could represent an early infiltrate. Chronic cardiomegaly. Aortic Atherosclerosis (ICD10-I70.0). Electronically Signed   By: Lorriane Shire M.D.   On: 03/18/2018 14:47    Procedures Procedures (including critical care time)  Medications Ordered in ED Medications  cefTRIAXone (ROCEPHIN) 2 g in sodium chloride 0.9 % 100 mL IVPB ( Intravenous Stopped 03/18/18 1602)  azithromycin (ZITHROMAX) 500 mg in sodium chloride 0.9 % 250 mL IVPB ( Intravenous Stopped 03/18/18 1800)  aspirin EC tablet 162 mg (has no administration in time range)  albuterol (PROVENTIL) (2.5 MG/3ML) 0.083% nebulizer solution 3 mL (has no administration in time range)  atorvastatin (LIPITOR) tablet 20 mg (20 mg Oral Given 03/18/18 2205)  carvedilol (COREG) tablet 12.5 mg (has no administration in time range)  digoxin (LANOXIN) tablet 0.125 mg (has no administration in time range)  fluticasone furoate-vilanterol (BREO ELLIPTA) 200-25 MCG/INH 1 puff (has no administration in time range)  losartan (COZAAR) tablet 25 mg (25 mg Oral Given 03/18/18 2200)  OXcarbazepine (TRILEPTAL) tablet 150 mg (150 mg Oral Given 03/18/18 2200)  pregabalin (LYRICA) capsule 150 mg (150 mg Oral Given 03/18/18 2200)  spironolactone (ALDACTONE) tablet 25 mg (has no administration in time range)  enoxaparin (LOVENOX) injection 40 mg (40 mg Subcutaneous Given 03/18/18 2200)  acetaminophen (TYLENOL) tablet 650 mg (has no administration in time range)    Or  acetaminophen (TYLENOL) suppository 650 mg (has no administration in time range)  ondansetron (ZOFRAN) tablet 4 mg (has no administration in time range)    Or  ondansetron (ZOFRAN) injection 4 mg (has no administration in time range)  albuterol (PROVENTIL) (2.5 MG/3ML) 0.083% nebulizer solution 2.5 mg (2.5 mg Nebulization Given 03/18/18 2149)  ofloxacin (OCUFLOX) 0.3 % ophthalmic solution 1 drop (1 drop Both Eyes Given 03/18/18 2201)  Difluprednate 0.05 % EMUL 1 drop (has no administration in  time range)  insulin aspart (novoLOG) injection 0-9 Units (has no administration in time range)  insulin aspart (novoLOG) injection 0-5 Units (2 Units Subcutaneous Given 03/18/18 2209)  predniSONE (DELTASONE) tablet 50 mg (50 mg Oral Given 03/18/18 2159)  ipratropium-albuterol (DUONEB)  0.5-2.5 (3) MG/3ML nebulizer solution 3 mL (has no administration in time range)  lactated ringers bolus 500 mL ( Intravenous Stopped 03/18/18 1712)  ipratropium-albuterol (DUONEB) 0.5-2.5 (3) MG/3ML nebulizer solution 3 mL (3 mLs Nebulization Given 03/18/18 1716)  furosemide (LASIX) injection 40 mg (40 mg Intravenous Given 03/18/18 1716)     Initial Impression / Assessment and Plan / ED Course  I have reviewed the triage vital signs and the nursing notes.  Pertinent labs & imaging results that were available during my care of the patient were reviewed by me and considered in my medical decision making (see chart for details).     JADIRA NIERMAN is a 72 y.o. female with PMHx of HFrEF (20-25% s/p AICD placement), COPD who presents with dyspnea, cough x 2 days. New O2 requirement. Reviewed and confirmed nursing documentation for past medical history, family history, social history. VS afebrile, BP 88/50, SpO2 84% on RA. Placed on 4L [place] with improvement in sats to 100%. Exam remarkable for tachypnea, diffuse rhonchi. Ddx includes PNA, CHF exacerbation, less likely purely COPD exacerbation.   Code sepsis initiated. Blood cultures pending. 500cc bolus given with improvement in BP to 829F systolic. EKG with no ischemic findings. duoneb given. CXR c/w L lower lobe PNA. IV abx given. Lactic acid wnl. BMP with stable CKD, Cr 1.38, otherwise unremarkable. CBC with leukocytosis 13.4, otherwise unremarkable. BNP elevated at 415, though c/w patient's previous checks, favor baseline.  Trop neg.   Old records reviewed. Labs reviewed by me and used in the medical decision making.  Imaging viewed and interpreted by me and used in  the medical decision making (formal interpretation from radiologist). EKG reviewed by me and used in the medical decision making. Admitted to family medicine.    Final Clinical Impressions(s) / ED Diagnoses   Final diagnoses:  Community acquired pneumonia of left lower lobe of lung ([place])  Hypoxia      Norm Salt, MD 03/19/18 [place], [place], DO 03/19/18 1521

## 2018-03-18 NOTE — H&P (Addendum)
[facility] Admission History and Physical Service Pager: [phone]  Patient name: Lisa [name] Medical record number: [id number] Date of birth: 1946/08/01 Age: 72 y.o. Gender: female  Primary Care Provider: [name], MD Consultants: none Code Status: full  Chief Complaint: shortness of breath  Assessment and Plan: Lisa [name] is a 72 y.o. female with a PMH of HFrEF (EF 25-30%), HTN, T2DM, COPD, and HLD presenting with 3 day history of progressive shortness of breath. Likely 2/2 LLL Pneumonia.  Shortness of breath Patient with new O2 requirement in setting of 4 day history of increasing cough and shortness of breath. Patient with likely early focal consolidation in LLL, which is consistent with exam findings. Patient likely with bacterial pneumonia. The decision point is to treat as CAP or HAP considering the patient's trips to hospital as visitor. Will treat as a CAP as patient not admitted to the hospital and has not taken recent abx. The other items to consider are copd, chf exacerbation, and PE. COPD may be contributing, as patient has diffuse wheezing on exam. CHF exacerbation could be possible given pitting edema and bnp >400. BNP stable from previous checks, and has only minimal edema. EF on last echo 10/2017 20-25%. PE unlikely as wells criteria is 0. ACS unlikely with I-stat trops of 0.02 and 0.00 and EKG without any ST/T wave changes. Able to decrease 4L [place] to 2L while in room. Still satting 100%. - admit to inpatient family medicine, telemetry, Dr. Mingo Amber - vital signs per telemetry routine - cardiac monitoring - continuous pulse ox - Wean O2 as tolerated - Will plan to ambulate with pulse ox prior to discharge - lasix 40mg  IV one time dose, re-evaluate for additional doses in the morning - albuterol neb q4hrs prn - Ceftriaxone IV 2g q 24 hours, will transition to PO antibiotics after 24 hours - azithromycin IV 500mg  q 24 hours - Will  add prednisone 50mg  daily given her diffuse wheezing - incentive spirometry - daily weights, strict I/O - heart healthy diet - lovenox for dvt ppx - tylenol for pain control - zofran for nausea prn - will need prescription for new albuterol inhaler on discharge  HFrEF  Non-Ischemic Cardiomyopathy  pulmonary hypertension Echo from 10/2017 showing 20-25% EF. Moderate pulm hypertension. Wt 184. Per last note from cardiology patient is to be on torsemide 80mg  daily, spironolactone 25mg  daily, losartan 25mg  qhs, digoxin 0.125mg , coreg 12.5 mg bid. ekg unchanged from previous tracing just showing a left anterior fascicular block. Will continue all of these medications aside from torsemide. Do not feel this is cardiac in origin. Dry weight is between 175-180 per patient report. - holding torsemide 80mg  - IV lasix 40mg  x 1, will re-evaluate fluid status in the morning - spironolactone 25mg  daily - losartan 25mg  qhs - digoxin 0.125mg  - coreg 12.5mg  bid - daily weights - strict I/O - aspirin 81mg   COPD Patient with some wheezing on exam. Takes breo as controller and albuterol prn. Will give one dose of albuterol and check for any improvement. Breo 1 puff daily, albuterol as needed  Hyperlipidemia Cholesterol 138, hdl 38, ldl 79 on lipid panel from 09/2017. Atorvastatin 20mg  daily. - continue atorvastatin 20mg  daily  Hypertension 123/77 on admission. On coreg, losartan, spironolactone, torsemide as outpatient. Will hold torsemide as patient will be receiving iv lasix. - continue losartan 25mg  dialy - coreg 12.5mg   - lasix 40mg  IV once - spiro 25mg  daily  CKD stage IIIb Patient with cr 1.38  on admission. Last check in may cr was 1.45. Unsure if this is just a trend or if it is new baseline. gfr 44 so technically 3b. Will trend while inpatient. On losartan 25mg  and torsemide 80mg . Creates balancing act as these could contribute to nephrotoxicity, but needs both of these agents 2/2 heart  failure. Will monitor with daily bmp. - daily bmp - losartan 25mg   - torsemide 80mg    H/O cataracts Patient with recently bilateral cataract surgery. Takes difluprednate, Ofloxacin drops. Will continue these will inpatient.  H/O epilepsy Patient on oxcarbazepine 150mg  bid. Unclear if patient with history of seizures. Will continue while inpatient.  Type II diabetes Glucose 125 on admission. Unclear what last a1c was as nothing in chart. Glimepiride 2mg  table daily as outpatient. Will hold glimepiride while admitted. sSSI for coverage. - sSSI - holding amaryl. Would switch medication on discharge, as this is a [employer] drug. - ac hs bs checks - check A1c  PMH is significant for HFrEF, HLD, HTN, T2DM, COPD, cataracts  FEN/GI: heart-healthy Prophylaxis: lovenox  Disposition: pending clinical course  History of Present Illness:  Lisa [name] is a 72 y.o. female presenting with a 1 day history of increasing shortness of breath. Patient initially developed cough on 6/22 which she initially described as non-productive. Her cough continued to get worse in the next couple of days until she eventually become short of breath overnight on 6/23. When she awoke on 6/24 she felt that she was alternating between being very febrile and feeling cold. Patient initially required 4L O2 on admission. Patient used albuterol nebs x2 prior to coming to hospital.  She decided to come to the ED. Initial workup included a cbc, bmp,bnp, trop, ua, blood cx, chest xray, ekg. This workup significant for cr 1.38, bnp 415.9, trop 0.02, wbc 13.4, LA 1.84, ua w/ moderate leuks. CXR showed new increased density at left lung base which could represent early infiltrate. Patient started on ctx and azithromycin. Was also given an albuterol nebulizer.  Of note, patient recently spent a lot of time in the hospital as a visitor recently as her daughter had open heart surgery. No known sick contacts. Regarding her past  heart issues she goes to [facility] every day for around 2 hours. Is able to bicycle and lift weights.  Review Of Systems: Per HPI with the following additions:   Review of Systems  Constitutional: Positive for chills and fever.  Respiratory: Positive for cough, sputum production and shortness of breath.   Cardiovascular: Positive for chest pain. Negative for palpitations, orthopnea and claudication.  Gastrointestinal: Negative for abdominal pain, constipation, diarrhea, nausea and vomiting.  Genitourinary: Negative for dysuria and urgency.  Musculoskeletal: Negative for myalgias.    Patient Active Problem List   Diagnosis Date Noted  . Acute on chronic systolic CHF (congestive heart failure) ([place]) 09/20/2017  . Mild CAD 09/20/2017  . COPD (chronic obstructive pulmonary disease) ([place]) 09/20/2017  . Wide-complex tachycardia ([place]) 09/20/2017  . Moderate mitral regurgitation 09/20/2017  . Moderate tricuspid regurgitation 09/20/2017  . Acute on chronic systolic (congestive) heart failure ([place]) 09/20/2017  . Acute on chronic combined systolic and diastolic CHF (congestive heart failure) ([place]) 04/09/2017  . Nonischemic cardiomyopathy ([place])  04/09/2017  . Dyslipidemia 04/09/2017  . ICD (implantable cardioverter-defibrillator) in place 04/09/2017  . Essential hypertension 04/09/2017  . Discoloration of skin of toe-Right 3rd Toe 12/31/2014  . Wound drainage-Right Groin 12/31/2014  . Right leg swelling 12/31/2014  . Pain in joint, ankle  and foot 12/31/2014  . Atherosclerosis of artery of extremity with intermittent claudication ([place]) 12/21/2014  . Paresthesia of both hands 03/19/2014  . Numbness- Right foot 03/19/2014  . Right foot pain 03/19/2014  . Peripheral vascular disease, unspecified ([place]) 12/26/2012  . Occlusion and stenosis of carotid artery without mention of cerebral infarction 11/14/2012    Past Medical History: Past Medical History:  Diagnosis Date  . AICD (automatic  cardioverter/defibrillator) present 2014   Biotroniks  . Arthritis    knees with arthritis as well as has back, pt. remarks that her hands fall asleep when she is in the bed or in certain positions   . Bell's palsy   . Carotid artery disease ([place])    a. duplex - moderate bilateral carotid disease (60-79% 06/2017).  . Chronic systolic CHF (congestive heart failure) ([place])   . COPD (chronic obstructive pulmonary disease) ([place])   . Diabetes mellitus without complication (HCC)    borderline  . Former tobacco use   . GERD (gastroesophageal reflux disease)    uses otc for occas. heartburn   . Hyperlipidemia   . Hypertension   . Mild CAD    a. nonobstructive CAD (50% PLOM 08/2017).  . Neuropathy   . Non-ischemic cardiomyopathy ([place])   . PAD (peripheral artery disease) ([place])    a.  s/p L-R fem-fem BPG 2016.  [name] Pneumonia    hosp. in [place]-2015  . Subclavian steal syndrome    a. h/o left subclavian steal.    Past Surgical History: Past Surgical History:  Procedure Laterality Date  . ABDOMINAL AORTAGRAM N/A 11/27/2014   Procedure: ABDOMINAL Maxcine Ham;  Surgeon: Elam Dutch, MD;  Location: [facility] CATH LAB;  Service: Cardiovascular;  Laterality: N/A;  . ABDOMINAL HYSTERECTOMY    . APPENDECTOMY    . CARDIAC CATHETERIZATION     02/20/13: Normal coronaries, moderate LV dysfunction, EF 35%. Medical RX ([facility])  . CARDIAC DEFIBRILLATOR PLACEMENT  Aug. 2014   dual chamber Biotronik ICD 05/06/13 ([facility], Dr. Minna Merritts)  . ENDARTERECTOMY FEMORAL Right 12/21/2014   Procedure: ENDARTERECTOMY FEMORAL;  Surgeon: Elam Dutch, MD;  Location: [place];  Service: Vascular;  Laterality: Right;  . FEMORAL-FEMORAL BYPASS GRAFT Bilateral 12/21/2014   Procedure: BYPASS GRAFT LEFT FEMORAL-RIGHT FEMORAL ARTERY;  Surgeon: Elam Dutch, MD;  Location: [place];  Service: Vascular;  Laterality: Bilateral;  . JOINT REPLACEMENT Bilateral    implants  . RIGHT/LEFT HEART CATH AND CORONARY ANGIOGRAPHY N/A 08/28/2017    Procedure: RIGHT/LEFT HEART CATH AND CORONARY ANGIOGRAPHY;  Surgeon: Larey Dresser, MD;  Location: [place] CV LAB;  Service: Cardiovascular;  Laterality: N/A;  . TUBAL LIGATION      Social History: Social History   Tobacco Use  . Smoking status: Former Smoker    Types: Cigarettes    Last attempt to quit: 11/14/2010    Years since quitting: 7.3  . Smokeless tobacco: Never Used  Substance Use Topics  . Alcohol use: No    Alcohol/week: 0.0 oz  . Drug use: No   Additional social history:  Please also refer to relevant sections of EMR.  Family History: Family History  Problem Relation Age of Onset  . Heart disease Mother   . Hypertension Mother     Allergies and Medications: Allergies  Allergen Reactions  . Oxycodone Shortness Of Breath  . Entresto [Sacubitril-Valsartan] Other (See Comments)    Impaired coordination---causes patient to drop many things, blood pressure bottomed out   No current facility-administered medications on file  prior to encounter.    Current Outpatient Medications on File Prior to Encounter  Medication Sig Dispense Refill  . acetaminophen (TYLENOL) 650 MG CR tablet Take 650 mg by mouth every 8 (eight) hours as needed for pain.    [name] albuterol (PROVENTIL HFA;VENTOLIN HFA) 108 (90 BASE) MCG/ACT inhaler Inhale 2 puffs into the lungs every 6 (six) hours as needed for wheezing or shortness of breath.    [name] albuterol (PROVENTIL) (2.5 MG/3ML) 0.083% nebulizer solution Take 3 mLs by nebulization every 4 (four) hours as needed for wheezing.    [name] aspirin EC 81 MG tablet Take 162 mg by mouth daily.    [name] atorvastatin (LIPITOR) 20 MG tablet TAKE 1 TABLET BY MOUTH DAILY 30 tablet 3  . carvedilol (COREG) 12.5 MG tablet TAKE ONE (1) TABLET BY MOUTH TWO (2) TIMES DAILY 180 tablet 1  . DIGOX 125 MCG tablet TAKE 1/2 TABLET BY MOUTH ONCE DAILY ON MONDAYS, WEDNESDAYS, AND FRIDAYS 27 tablet 6  . fluticasone furoate-vilanterol (BREO ELLIPTA) 200-25 MCG/INH AEPB Inhale 1  puff into the lungs daily.    [name] glimepiride (AMARYL) 2 MG tablet Take 2 mg by mouth daily.    [name] losartan (COZAAR) 25 MG tablet Take 1 tablet (25 mg total) by mouth at bedtime. 30 tablet 3  . nystatin (MYCOSTATIN) 100000 UNIT/ML suspension Take 5 mLs (500,000 Units total) by mouth 4 (four) times daily. Swish in the mouth for a couple of mins prior to swallowing. 200 mL 0  . OXcarbazepine (TRILEPTAL) 150 MG tablet Take 150 mg by mouth 2 (two) times daily.    . pregabalin (LYRICA) 150 MG capsule Take 150 mg by mouth 2 (two) times daily.    [name] spironolactone (ALDACTONE) 25 MG tablet Take 1 tablet (25 mg total) by mouth daily. 30 tablet 3  . torsemide (DEMADEX) 20 MG tablet Take 4 tablets (80 mg total) by mouth daily. Take extra tablet once weekly AS NEEDED for weight 178 lbs or more. 124 tablet 6    Objective: BP 123/77   Pulse 60   Temp 98.6 F (37 C) (Oral)   Resp 20   SpO2 100%  Exam: General: alert, oriented. Well-appearing AA female in no acute distress, on 4L [place] Eyes: eomi, perrla ENTM: no external trauma external face and ears Neck: range of motion intact Cardiovascular: rrr, no m/r/g, palpable peripheral pulses. 1+ pitting edema BLE Respiratory: Wheezing present in upper lungs bilaterally. Coarse crackles noted in LLL. On 4L [place], work of breathing ok Gastrointestinal: soft, non-tender, non-distended. MSK: 5/5 strength BUE, BLE. Derm: warm and dry Neuro: cn 2-12 intact, no focal neuro deficits,  Psych: alert and oriented x3, very pleasant  Labs and Imaging: CBC BMET  Recent Labs  Lab 03/18/18 1341  WBC 13.4*  HGB 13.4  HCT 42.3  PLT 258   Recent Labs  Lab 03/18/18 1341  NA 140  K 4.5  CL 97*  CO2 31  BUN 24*  CREATININE 1.38*  GLUCOSE 125*  CALCIUM 9.4      Guadalupe Dawn, MD 03/18/2018, 4:49 PM PGY-1, [place] Intern pager: [phone], text pages welcome  FPTS Upper-Level Resident Addendum  I have independently interviewed and  examined the patient. I have discussed the above with the original author and agree with their documentation. My edits for correction/addition/clarification are in blue. Please see also any attending notes.   Hyman Bible, MD PGY-3, [place] Service pager: [phone] (text pages welcome through  [facility])

## 2018-03-18 NOTE — ED Triage Notes (Signed)
Patient to ED c/o worsening SOB since last night, hx COPD and CHF, not on home oxygen. She reports congestion and cough x 4 days that is also worse today. She endorses some chest pains over the last couple days as well, but denies at this time. No fevers/chills. Room air O2 saturation 87% - placed on 2L nasal cannula, increasing oxygen to 94%. Respirations e/u at rest, skin warm/dry.

## 2018-03-18 NOTE — ED Provider Notes (Signed)
Patient placed in Quick Look pathway, seen and evaluated   Chief Complaint: shortness of breath.   HPI:   Lisa [name] is a 72 y.o. female who presents to the ED with cough and shortness of breath x 2 days that has gotten much worse. Patient is not on O2 at home. Started on O2 2LNC on arrival to ED after O2 Sat was 87% on R/A.   ROS: Resp: shortness of breath  Physical Exam:  BP (!) 88/50 (BP Location: Right Arm)   Pulse 60   Temp 98.6 F (37 C) (Oral)   Resp 20   SpO2 93%    Gen: No distress  Neuro: Awake and Alert  Skin: Warm and dry  Lungs: bilateral wheezing and rales     Will take patient directly to a room.   Initiation of care has begun. The patient has been counseled on the process, plan, and necessity for staying for the completion/evaluation, and the remainder of the medical screening examination    Ashley Murrain, NP 03/18/18 1326    Julianne Rice, MD 03/19/18 [phone]

## 2018-03-19 DIAGNOSIS — J441 Chronic obstructive pulmonary disease with (acute) exacerbation: Secondary | ICD-10-CM

## 2018-03-19 DIAGNOSIS — J181 Lobar pneumonia, unspecified organism: Secondary | ICD-10-CM

## 2018-03-19 LAB — CBC WITH DIFFERENTIAL/PLATELET
Abs Immature Granulocytes: 0 10*3/uL (ref 0.0–0.1)
BASOS ABS: 0 10*3/uL (ref 0.0–0.1)
BASOS PCT: 0 %
EOS ABS: 0 10*3/uL (ref 0.0–0.7)
EOS PCT: 0 %
HCT: 39 % (ref 36.0–46.0)
Hemoglobin: 12.6 g/dL (ref 12.0–15.0)
Immature Granulocytes: 0 %
Lymphocytes Relative: 11 %
Lymphs Abs: 0.9 10*3/uL (ref 0.7–4.0)
MCH: 31.2 pg (ref 26.0–34.0)
MCHC: 32.3 g/dL (ref 30.0–36.0)
MCV: 96.5 fL (ref 78.0–100.0)
MONO ABS: 0.2 10*3/uL (ref 0.1–1.0)
Monocytes Relative: 2 %
Neutro Abs: 7.6 10*3/uL (ref 1.7–7.7)
Neutrophils Relative %: 87 %
Platelets: 204 10*3/uL (ref 150–400)
RBC: 4.04 MIL/uL (ref 3.87–5.11)
RDW: 13.8 % (ref 11.5–15.5)
WBC: 8.8 10*3/uL (ref 4.0–10.5)

## 2018-03-19 LAB — GLUCOSE, CAPILLARY
GLUCOSE-CAPILLARY: 212 mg/dL — AB (ref 70–99)
GLUCOSE-CAPILLARY: 334 mg/dL — AB (ref 70–99)
Glucose-Capillary: 403 mg/dL — ABNORMAL HIGH (ref 70–99)
Glucose-Capillary: 430 mg/dL — ABNORMAL HIGH (ref 70–99)

## 2018-03-19 LAB — TROPONIN I: Troponin I: <0.03 ng/mL (ref <0.03)

## 2018-03-19 LAB — COMPREHENSIVE METABOLIC PANEL
ALK PHOS: 79 U/L (ref 38–126)
ALT: 17 U/L (ref 0–44)
ANION GAP: 12 (ref 5–15)
AST: 18 U/L (ref 15–41)
Albumin: 3.5 g/dL (ref 3.5–5.0)
BILIRUBIN TOTAL: 0.8 mg/dL (ref 0.3–1.2)
BUN: 22 mg/dL (ref 8–23)
CALCIUM: 9.2 mg/dL (ref 8.9–10.3)
CO2: 31 mmol/L (ref 22–32)
CREATININE: 1.17 mg/dL — AB (ref 0.44–1.00)
Chloride: 97 mmol/L — ABNORMAL LOW (ref 98–111)
GFR, EST AFRICAN AMERICAN: 53 mL/min — AB (ref >60)
GFR, EST NON AFRICAN AMERICAN: 46 mL/min — AB (ref >60)
Glucose, Bld: 218 mg/dL — ABNORMAL HIGH (ref 70–99)
Potassium: 4.1 mmol/L (ref 3.5–5.1)
Sodium: 140 mmol/L (ref 135–145)
TOTAL PROTEIN: 7.5 g/dL (ref 6.5–8.1)

## 2018-03-19 LAB — MAGNESIUM: MAGNESIUM: 2.1 mg/dL (ref 1.7–2.4)

## 2018-03-19 LAB — HIV ANTIBODY (ROUTINE TESTING W REFLEX): HIV Screen 4th Generation wRfx: NONREACTIVE

## 2018-03-19 LAB — HEMOGLOBIN A1C
Hgb A1c MFr Bld: 7 % — ABNORMAL HIGH (ref 4.8–5.6)
Mean Plasma Glucose: 154.2 mg/dL

## 2018-03-19 LAB — PHOSPHORUS: PHOSPHORUS: 3.3 mg/dL (ref 2.5–4.6)

## 2018-03-19 MED ORDER — INSULIN GLARGINE 100 UNIT/ML ~~LOC~~ SOLN
10.0000 [IU] | Freq: Every day | SUBCUTANEOUS | Status: DC
  Administered 2018-03-19: 10 [IU] via SUBCUTANEOUS
  Filled 2018-03-19: qty 0.1

## 2018-03-19 MED ORDER — INSULIN ASPART 100 UNIT/ML ~~LOC~~ SOLN
0.0000 [IU] | Freq: Three times a day (TID) | SUBCUTANEOUS | Status: DC
  Administered 2018-03-19: 15 [IU] via SUBCUTANEOUS

## 2018-03-19 MED ORDER — CARVEDILOL 3.125 MG PO TABS
3.1250 mg | ORAL_TABLET | Freq: Two times a day (BID) | ORAL | Status: DC
  Administered 2018-03-19 – 2018-03-20 (×3): 3.125 mg via ORAL
  Filled 2018-03-19 (×3): qty 1

## 2018-03-19 MED ORDER — CEFDINIR 300 MG PO CAPS
300.0000 mg | ORAL_CAPSULE | Freq: Two times a day (BID) | ORAL | Status: DC
  Administered 2018-03-19 – 2018-03-20 (×3): 300 mg via ORAL
  Filled 2018-03-19 (×4): qty 1

## 2018-03-19 MED ORDER — INSULIN ASPART 100 UNIT/ML ~~LOC~~ SOLN
0.0000 [IU] | Freq: Three times a day (TID) | SUBCUTANEOUS | Status: DC
  Administered 2018-03-20: 3 [IU] via SUBCUTANEOUS
  Administered 2018-03-20: 7 [IU] via SUBCUTANEOUS

## 2018-03-19 MED ORDER — IPRATROPIUM-ALBUTEROL 0.5-2.5 (3) MG/3ML IN SOLN
3.0000 mL | Freq: Two times a day (BID) | RESPIRATORY_TRACT | Status: DC
  Administered 2018-03-19 – 2018-03-20 (×2): 3 mL via RESPIRATORY_TRACT
  Filled 2018-03-19 (×2): qty 3

## 2018-03-19 MED ORDER — INSULIN ASPART 100 UNIT/ML ~~LOC~~ SOLN
0.0000 [IU] | Freq: Every day | SUBCUTANEOUS | Status: DC
  Administered 2018-03-19: 5 [IU] via SUBCUTANEOUS

## 2018-03-19 MED ORDER — AZITHROMYCIN 500 MG PO TABS
250.0000 mg | ORAL_TABLET | Freq: Every day | ORAL | Status: DC
  Administered 2018-03-19 – 2018-03-20 (×2): 250 mg via ORAL
  Filled 2018-03-19 (×2): qty 1

## 2018-03-19 MED ORDER — TORSEMIDE 20 MG PO TABS
80.0000 mg | ORAL_TABLET | Freq: Every day | ORAL | Status: DC
  Administered 2018-03-19 – 2018-03-20 (×2): 80 mg via ORAL
  Filled 2018-03-19 (×2): qty 4

## 2018-03-19 NOTE — Progress Notes (Signed)
Family Medicine Teaching Service Daily Progress Note Intern Pager: [phone]  Patient name: Lisa [name] Medical record number: [id number] Date of birth: 29-Jun-1946 Age: 72 y.o. Gender: female  Primary Care Provider: [name], MD Consultants: none Code Status: full  Pt Overview and Major Events to Date:  6/24 admitted to fpts  Assessment and Plan: Lisa [name] is a 72 y.o. female with a PMH of HFrEF (EF 25-30%), HTN, T2DM, COPD, and HLD presenting with 3 day history of progressive shortness of breath. Likely 2/2 LLL Pneumonia.  Shortness of breath, likely LLL pna Resting comfortably on 2L, turned to room air with no desats or increased work of breathing. WBC down from 13.4 to 8.8. Has improved from combination of antibiotics, lasix, and prednisone/albuterol. Patient without any wheezing or crackles on exam this am. Still with coarse breath sounds in left lower lobe. This picture likely LLL pna with copd and chf perhaps only slightly contributing. Will plan to switch to omnicef and oral azithro. Will plan on ambulating patient with PT with pulse ox. If no desats can likely dc with a 7 day course of omnicef and 5 days of azithro. Will get single troponin to rule out acs. Will restart home torsemide this am. - vital signs per telemetry routine - cardiac monitoring - continuous pulse ox - wean o2 as tolerated - ambulate with pulse ox prior to dc - restarting home torsemide 80mg  daily - dc ceftriaxone, switch to omnicef 300mg  bid - oral azithro 250mg  daily - heart healthy diet - lovenox for dvt ppx - tylenol for pain - zofran for nausea - script for albuterol inhaler on dc  HFrEF  Non-Ischemic Cardiomyopathy  pulmonary hypertension Echo from 10/2017 showing 20-25% EF. Moderate pulm hypertension. Wt 185 from 184. Per last note from cardiology patient is to be on torsemide 80mg  daily, spironolactone 25mg  daily, losartan 25mg  qhs, digoxin 0.125mg , coreg 12.5 mg bid. Has had  some fairly low sbp overnight but most recent check this am 116/58. - hold spiro - hold losartan - continue digoxin - decrease coreg to 3.125mg  bid - strict I/O - daily weights - aspirin 81mg   COPD Patient with some wheezing on exam at admission, which are now absent. Takes breo as controller and albuterol prn.  - needs new inhaler prior to dc - continue breo  Hyperlipidemia Cholesterol 138, hdl 38, ldl 79 on lipid panel from 09/2017. Atorvastatin 20mg  daily. - continue atorvastatin 20mg  daily  Hypertension 116/51 on admission. Will hold spiro and losartan. Restarting home torsemide. Also will decrease coreg to 3.125mg  bid. - hold losartan, spiro - coreg 3.125mg  bid - restart torsemide 80mg  daily  CKD stage IIIb Patient with cr 1.38 on admission. Last check in may cr was 1.45. Unsure if this is just a trend or if it is new baseline. gfr 44 so technically 3b. Will trend while inpatient. On losartan 25mg  and torsemide 80mg . Creates balancing act as these could contribute to nephrotoxicity, but needs both of these agents 2/2 heart failure. Will monitor with daily bmp. - daily bmp - losartan 25mg   - torsemide 80mg    H/O cataracts Patient with recently bilateral cataract surgery. Takes difluprednate, Ofloxacin drops. Will continue these will inpatient.  H/O epilepsy Patient on oxcarbazepine 150mg  bid. Unclear if patient with history of seizures. Will continue while inpatient.  Type II diabetes Glucose 125 on admission. Unclear what last a1c was as nothing in chart. Glimepiride 2mg  table daily as outpatient. Will hold glimepiride while admitted. sSSI for coverage. -  sSSI - holding amaryl. Would switch medication on discharge, as this is a [employer] drug. - ac hs bs checks - check A1c  FEN/GI: heart healthy PPx: lovenox  Disposition: likely home  Subjective:  Feeling well this morning. Shortness of breath has resolved. Has much more energy.  Objective: Temp:   [97.8 F (36.6 C)-98.6 F (37 C)] 98.1 F (36.7 C) (06/25 0513) Pulse Rate:  [59-69] 59 (06/25 0920) Resp:  [14-24] 17 (06/25 0920) BP: (88-136)/(36-77) 116/58 (06/25 0920) SpO2:  [87 %-100 %] 93 % (06/25 0920) Weight:  [185 lb 6.5 oz (84.1 kg)] 185 lb 6.5 oz (84.1 kg) (06/24 1858) Physical Exam: General: alert, oriented. Well-appearing AA female in no acute distress, on room air Cardiovascular: regular rate rhythm, no mumurs/rubs/gallops, palpable peripheral pulses. trace pitting edema BLE Respiratory: greatly improved breath sounds. Still with Coarse crackles noted in LLL. On room air Gastrointestinal: soft, non-tender, non-distended. MSK: 5/5 strength BUE, BLE. Derm: warm and dry Neuro: cn 2-12 intact, no focal neuro deficits,  Psych: alert and oriented x3, very pleasant  Laboratory: Recent Labs  Lab 03/18/18 1341 03/19/18 0400  WBC 13.4* 8.8  HGB 13.4 12.6  HCT 42.3 39.0  PLT 258 204   Recent Labs  Lab 03/18/18 1341 03/19/18 0400  NA 140 140  K 4.5 4.1  CL 97* 97*  CO2 31 31  BUN 24* 22  CREATININE 1.38* 1.17*  CALCIUM 9.4 9.2  PROT  --  7.5  BILITOT  --  0.8  ALKPHOS  --  79  ALT  --  17  AST  --  18  GLUCOSE 125* 218*    Imaging/Diagnostic Tests: CLINICAL DATA:  Increasing shortness of breath since last night. Cough and chest congestion.  EXAM: CHEST - 2 VIEW  COMPARISON:  Chest x-rays dated 09/20/2017 and 07/04/2017 and chest CT dated 10/31/2017  FINDINGS: Chronic cardiomegaly. AICD in place. Pulmonary vascularity is normal. Aortic atherosclerosis.  There is increased density at the left lung base. No discrete effusions. No acute bone abnormality.  IMPRESSION: New increased density at the left lung base which could represent an early infiltrate.  Chronic cardiomegaly.  Aortic Atherosclerosis (ICD10-I70.0).  Lisa Dawn, MD 03/19/2018, 9:50 AM PGY-1, [place] Intern pager: [phone], text pages  welcome

## 2018-03-19 NOTE — Progress Notes (Signed)
Patient ambulated in hallway with nursing student. Patient was on RA and O2 sat was 86% while ambulating and getting back into the room it was 87%. Will continue to monitor.   Farley Ly RN

## 2018-03-19 NOTE — Progress Notes (Signed)
Pt's CBG=430. Patient to get 5 units of novolog per sliding scale. MD on call text paged,new orders received. Mirna Sutcliffe, Wonda Cheng, Therapist, sports

## 2018-03-19 NOTE — Progress Notes (Addendum)
Family Medicine progress update  Patient with noted desat while ambulating with PT. Will watch overnight and retry in am. Feels as though she is ok, but think an extra night will help her. Will also give a chance to monitor response on po abx.  Guadalupe Dawn MD PGY-1 Family Medicine Resident

## 2018-03-19 NOTE — Evaluation (Signed)
Physical Therapy Evaluation/ Discharge Patient Details Name: Lisa [name] MRN: [id number] DOB: 09/03/1946 Today's Date: 03/19/2018   History of Present Illness   72 y.o. female with a PMH of HFrEF (EF 25-30%), HTN, T2DM, COPD, and HLD presenting with SOB and PNA  Clinical Impression  Pt very pleasant and moving well. She [place] the rec center daily but isn't a big walker but can perform community mobility without difficulty. Pt reports unsteadiness at times but refuses AD use and got rid of her walker. Pt educated for SpO2 with activity with pt stating she didn't feel any differently with vs without supplemental oxygen with gait. Pt is at baseline functional mobility without further therapy needs at this time. Will sign off with pt aware and agreeable.    Pt with noted desaturation after 200' to 86% with SpO2 maintained at 93% on 2L with gait, 95% on RA at rest.      Follow Up Recommendations No PT follow up    Equipment Recommendations  None recommended by PT    Recommendations for Other Services       Precautions / Restrictions Precautions Precautions: None      Mobility  Bed Mobility Overal bed mobility: Modified Independent                Transfers Overall transfer level: Modified independent                  Ambulation/Gait Ambulation/Gait assistance: Modified independent (Device/Increase time) Gait Distance (Feet): 750 Feet Assistive device: None Gait Pattern/deviations: Step-through pattern;WFL(Within Functional Limits)   Gait velocity interpretation: >2.62 ft/sec, indicative of community ambulatory General Gait Details: Pt on RA for walking 400' with SpO2 drop after 200' to 86%, standing rest to recover to 91% with sats 88% maintained on return walk. Walked additional 350' with 2L with SpO2 93% maintained  Stairs            Wheelchair Mobility    Modified Rankin (Stroke Patients Only)       Balance Overall balance assessment:  Mild deficits observed, not formally tested                                           Pertinent Vitals/Pain Pain Assessment: No/denies pain    Home Living Family/patient expects to be discharged to:: Private residence Living Arrangements: Spouse/significant other Available Help at Discharge: Family;Available 24 hours/day Type of Home: House Home Access: Level entry     Home Layout: Two level;Able to live on main level with bedroom/bathroom Home Equipment: Cane - single point      Prior Function Level of Independence: Independent         Comments: difficulty with socks, gave away her walker, goes to the rec center daily to bike and lift weights     Hand Dominance        Extremity/Trunk Assessment   Upper Extremity Assessment Upper Extremity Assessment: Overall WFL for tasks assessed    Lower Extremity Assessment Lower Extremity Assessment: Overall WFL for tasks assessed    Cervical / Trunk Assessment Cervical / Trunk Assessment: Normal  Communication   Communication: No difficulties  Cognition Arousal/Alertness: Awake/alert Behavior During Therapy: WFL for tasks assessed/performed Overall Cognitive Status: Within Functional Limits for tasks assessed  General Comments General comments (skin integrity, edema, etc.): pt reports one near fall at home with assist of family to catch her    Exercises     Assessment/Plan    PT Assessment Patent does not need any further PT services  PT Problem List         PT Treatment Interventions      PT Goals (Current goals can be found in the Care Plan section)  Acute Rehab PT Goals PT Goal Formulation: All assessment and education complete, DC therapy    Frequency     Barriers to discharge        Co-evaluation               AM-PAC PT "6 Clicks" Daily Activity  Outcome Measure Difficulty turning over in bed (including adjusting  bedclothes, sheets and blankets)?: None Difficulty moving from lying on back to sitting on the side of the bed? : None Difficulty sitting down on and standing up from a chair with arms (e.g., wheelchair, bedside commode, etc,.)?: None Help needed moving to and from a bed to chair (including a wheelchair)?: None Help needed walking in hospital room?: None Help needed climbing 3-5 steps with a railing? : None 6 Click Score: 24    End of Session Equipment Utilized During Treatment: Gait belt;Oxygen Activity Tolerance: Patient tolerated treatment well Patient left: in chair;with call bell/phone within reach;with chair alarm set;with family/visitor present;with nursing/sitter in room Nurse Communication: Mobility status PT Visit Diagnosis: Other abnormalities of gait and mobility (R26.89)    Time: 8413-2440 PT Time Calculation (min) (ACUTE ONLY): 22 min   Charges:   PT Evaluation $PT Eval Low Complexity: 1 Low     PT G Codes:        Elwyn Reach, PT [phone]   Kaelen Brennan B Ishmael Berkovich 03/19/2018, 12:42 PM

## 2018-03-19 NOTE — Progress Notes (Signed)
Inpatient Diabetes Program Recommendations  AACE/ADA: New Consensus Statement on Inpatient Glycemic Control (2015)  Target Ranges:  Prepandial:   less than 140 mg/dL      Peak postprandial:   less than 180 mg/dL (1-2 hours)      Critically ill patients:  140 - 180 mg/dL   Lab Results  Component Value Date   GLUCAP 334 (H) 03/19/2018   HGBA1C 7.0 (H) 03/19/2018    Review of Glycemic Control Results for LEILENE, DIPRIMA (MRN [id number]) as of 03/19/2018 13:22  Ref. Range 03/18/2018 21:12 03/19/2018 08:06 03/19/2018 12:02  Glucose-Capillary Latest Ref Range: 70 - 99 mg/dL 222 (H) 212 (H) 334 (H)   Diabetes history: Type 2 DM Outpatient Diabetes medications: Amaryl 2 mg QAM Current orders for Inpatient glycemic control: Novolog 0-9 units TID, Novolog 0-5 units QHS, Prednisone 50 mg QAM  Inpatient Diabetes Program Recommendations:    In the setting of steroids, anticipated BS to increase. Consider adding Levemir 10 units QHS and Novolog 4 units TID (assuming that patient is consuming >50% of meal). Will need further adjustments if steroids are decreased or discontinued.  Thanks, Bronson Curb, MSN, RNC-OB Diabetes Coordinator [phone] (8a-5p)

## 2018-03-20 LAB — BASIC METABOLIC PANEL
Anion gap: 11 (ref 5–15)
BUN: 29 mg/dL — ABNORMAL HIGH (ref 8–23)
CHLORIDE: 97 mmol/L — AB (ref 98–111)
CO2: 31 mmol/L (ref 22–32)
CREATININE: 1.19 mg/dL — AB (ref 0.44–1.00)
Calcium: 9.4 mg/dL (ref 8.9–10.3)
GFR calc Af Amer: 52 mL/min — ABNORMAL LOW (ref >60)
GFR calc non Af Amer: 45 mL/min — ABNORMAL LOW (ref >60)
Glucose, Bld: 218 mg/dL — ABNORMAL HIGH (ref 70–99)
POTASSIUM: 3.5 mmol/L (ref 3.5–5.1)
SODIUM: 139 mmol/L (ref 135–145)

## 2018-03-20 LAB — CBC
HEMATOCRIT: 38.4 % (ref 36.0–46.0)
Hemoglobin: 12.3 g/dL (ref 12.0–15.0)
MCH: 31.1 pg (ref 26.0–34.0)
MCHC: 32 g/dL (ref 30.0–36.0)
MCV: 97.2 fL (ref 78.0–100.0)
Platelets: 247 10*3/uL (ref 150–400)
RBC: 3.95 MIL/uL (ref 3.87–5.11)
RDW: 13.8 % (ref 11.5–15.5)
WBC: 14 10*3/uL — AB (ref 4.0–10.5)

## 2018-03-20 LAB — GLUCOSE, CAPILLARY
Glucose-Capillary: 140 mg/dL — ABNORMAL HIGH (ref 70–99)
Glucose-Capillary: 232 mg/dL — ABNORMAL HIGH (ref 70–99)

## 2018-03-20 MED ORDER — CEFDINIR 300 MG PO CAPS: 300.0000 mg | ORAL_CAPSULE | Freq: Two times a day (BID) | ORAL | 0 refills | Status: AC

## 2018-03-20 MED ORDER — AZITHROMYCIN 250 MG PO TABS: 250.0000 mg | ORAL_TABLET | Freq: Every day | ORAL | 0 refills | Status: AC

## 2018-03-20 MED ORDER — ALBUTEROL SULFATE HFA 108 (90 BASE) MCG/ACT IN AERS: 2.0000 | INHALATION_SPRAY | Freq: Four times a day (QID) | RESPIRATORY_TRACT | 6 refills | Status: AC | PRN

## 2018-03-20 MED ORDER — PREDNISONE 20 MG PO TABS
20.0000 mg | ORAL_TABLET | Freq: Every day | ORAL | Status: DC
  Administered 2018-03-20: 20 mg via ORAL
  Filled 2018-03-20: qty 1

## 2018-03-20 MED ORDER — PREDNISONE 20 MG PO TABS: 20.0000 mg | ORAL_TABLET | Freq: Every day | ORAL | 0 refills | Status: DC

## 2018-03-20 NOTE — Progress Notes (Signed)
Inpatient Diabetes Program Recommendations  AACE/ADA: New Consensus Statement on Inpatient Glycemic Control (2015)  Target Ranges:  Prepandial:   less than 140 mg/dL      Peak postprandial:   less than 180 mg/dL (1-2 hours)      Critically ill patients:  140 - 180 mg/dL   Lab Results  Component Value Date   GLUCAP 140 (H) 03/20/2018   HGBA1C 7.0 (H) 03/19/2018    Review of Glycemic Control Results for DEANN, MCLAINE (MRN [id number]) as of 03/20/2018 11:39  Ref. Range 03/19/2018 12:02 03/19/2018 16:38 03/19/2018 22:12 03/20/2018 07:21  Glucose-Capillary Latest Ref Range: 70 - 99 mg/dL 334 (H) 403 (H) 430 (H) 140 (H)   Diabetes history: Type 2 DM Outpatient Diabetes medications: Amaryl 2 mg QAM Current orders for Inpatient glycemic control: Novolog 0-20 units TID, Lantus 10 units QHS, Novolog 0-5 units QHS, Prednisone 50 mg QAM  Inpatient Diabetes Program Recommendations:    If to remain inpatient, consider adding Novolog 4 units TID (assuming that patient is consuming >50% of meal). Will need further adjustments if steroids are decreased or discontinued. Noted increase to correction, patient may have lows on this amount of correction. Consider decreasing correction to Novolog 0-15 units TID.  Thanks, Bronson Curb, MSN, RNC-OB Diabetes Coordinator [phone] (8a-5p)

## 2018-03-20 NOTE — Progress Notes (Signed)
Patient discharged to home. Patient AVS reviewed and signed. Patient capable re-verbalizing medications and follow-up appointments. IV removed. Patient belongings sent with patient. Patient educated to return to the ED in the event of SOB, chest pain or dizziness.   Christoher Drudge B. RN 

## 2018-03-20 NOTE — Progress Notes (Signed)
Patient walked in the hallway with NT w/o oxygen. O2 sat were 91-95% while ambulating on RA. Will continue to monitor.   Farley Ly RN

## 2018-03-20 NOTE — Discharge Instructions (Signed)

## 2018-03-20 NOTE — Discharge Summary (Signed)
[facility] Discharge Summary  Patient name: Lisa [name] Medical record number: [id number] Date of birth: 1946-05-04 Age: 72 y.o. Gender: female Date of Admission: 03/18/2018  Date of Discharge: 03/20/2018 Admitting Physician: Alveda Reasons, MD  Primary Care Provider: [name], MD Consultants: None  Indication for Hospitalization:   Lower Lobe PNA  Discharge Diagnoses/Problem List:   Lower Lobe PNA HFrEF COPD HLD HTN T2DM CKD stage IIIb  Disposition: home  Discharge Condition: medically stable  Discharge Exam:   Gen: Alert and Oriented x 3, NAD HEENT: Normocephalic, atraumatic, PERRLA, EOMI CV: RRR, no murmurs, normal S1, S2 split, +2 pulses dorsalis pedis bilaterally Resp: CTAB, no wheezing or rales, LLL rhonchi, comfortable work of breathing Abd: non-distended, non-tender, soft, +bs in all four quadrants MSK: Moves all four extremities Ext: no clubbing, cyanosis, or edema Skin: warm, dry, intact, no rashes  Brief Hospital Course:  Lisa Lisa [name] is a 72y/o female with PMH of HFrEF (EF 25-30%), HTN, T2DM, COPD, and HLDpresenting with3 day history of progressive shortness of breathlikely 2/2 LLL Pneumonia. She presented to the ED with a new oxygen requirement with cough and shortness of breath. CXR showed focal consolidation in the left lower lobe and she had an elevated WBC. She was admitted and started on IV antibiotics and prednisone. There was also concern for COPD exacerbation which would also be treated with antibiotics and steroids. She was kept on her home inhaler medications for COPD and she was given duonebs as needed. Over the course of two days her condition improved, she was able to tolerate being weaned down to room air and was able to ambulate with no desaturations on room air. She was transitioned to oral ABX (Omnicef 300mg  BID) and oral Azithromycin 250mg  daily.     Issues for Follow Up:  1. Lisa [name] was  started on antibiotics for LLL PNA. Please ensure she takes the full course of antibiotics as prescribed. 2. Her blood pressure medications were held while in the hospital. Please ensure she has started retaking them and her blood pressure is at goal.  Significant Procedures: None  Significant Labs and Imaging:  Recent Labs  Lab 03/18/18 1341 03/19/18 0400 03/20/18 0559  WBC 13.4* 8.8 14.0*  HGB 13.4 12.6 12.3  HCT 42.3 39.0 38.4  PLT 258 204 247   Recent Labs  Lab 03/18/18 1341 03/19/18 0400 03/20/18 0559  NA 140 140 139  K 4.5 4.1 3.5  CL 97* 97* 97*  CO2 31 31 31   GLUCOSE 125* 218* 218*  BUN 24* 22 29*  CREATININE 1.38* 1.17* 1.19*  CALCIUM 9.4 9.2 9.4  MG  --  2.1  --   PHOS  --  3.3  --   ALKPHOS  --  79  --   AST  --  18  --   ALT  --  17  --   ALBUMIN  --  3.5  --     Imaging/Diagnostic Tests: CLINICAL DATA: Increasing shortness of breath since last night. Cough and chest congestion.  EXAM: CHEST - 2 VIEW  COMPARISON: Chest x-rays dated 09/20/2017 and 07/04/2017 and chest CT dated 10/31/2017  FINDINGS: Chronic cardiomegaly. AICD in place. Pulmonary vascularity is normal. Aortic atherosclerosis.  There is increased density at the left lung base. No discrete effusions. No acute bone abnormality.  IMPRESSION: New increased density at the left lung base which could represent an early infiltrate.  Chronic cardiomegaly.  Aortic Atherosclerosis (ICD10-I70.0).  Results/Tests Pending at Time of Discharge: None  Discharge Medications:  Allergies as of 03/20/2018      Reactions   Oxycodone Shortness Of Breath   Entresto [sacubitril-valsartan] Other (See Comments)   Impaired coordination---causes patient to drop many things, blood pressure bottomed out      Medication List    STOP taking these medications   nystatin 100000 UNIT/ML suspension Commonly known as:  MYCOSTATIN   potassium chloride SA 20 MEQ tablet Commonly known as:   K-DUR,KLOR-CON     TAKE these medications   acetaminophen 650 MG CR tablet Commonly known as:  TYLENOL Take 650 mg by mouth every 8 (eight) hours as needed for pain.   albuterol (2.5 MG/3ML) 0.083% nebulizer solution Commonly known as:  PROVENTIL Take 3 mLs by nebulization every 4 (four) hours as needed for wheezing.   albuterol 108 (90 Base) MCG/ACT inhaler Commonly known as:  PROVENTIL HFA;VENTOLIN HFA Inhale 2 puffs into the lungs every 6 (six) hours as needed for wheezing or shortness of breath.   aspirin EC 81 MG tablet Take 162 mg by mouth daily.   atorvastatin 20 MG tablet Commonly known as:  LIPITOR TAKE 1 TABLET BY MOUTH DAILY   azithromycin 250 MG tablet Commonly known as:  ZITHROMAX Take 1 tablet (250 mg total) by mouth daily for 2 days.   BREO ELLIPTA 200-25 MCG/INH Aepb Generic drug:  fluticasone furoate-vilanterol Inhale 1 puff into the lungs daily.   carvedilol 12.5 MG tablet Commonly known as:  COREG TAKE ONE (1) TABLET BY MOUTH TWO (2) TIMES DAILY   cefdinir 300 MG capsule Commonly known as:  OMNICEF Take 1 capsule (300 mg total) by mouth every 12 (twelve) hours for 2 days.   Difluprednate 0.05 % Emul Apply 1 drop to eye daily.   DIGOX 0.125 MG tablet Generic drug:  digoxin TAKE 1/2 TABLET BY MOUTH ONCE DAILY ON MONDAYS, WEDNESDAYS, AND FRIDAYS   glimepiride 2 MG tablet Commonly known as:  AMARYL Take 2 mg by mouth daily.   losartan 25 MG tablet Commonly known as:  COZAAR Take 1 tablet (25 mg total) by mouth at bedtime.   ofloxacin 0.3 % ophthalmic solution Commonly known as:  OCUFLOX Place 1 drop into both eyes 4 (four) times daily.   OXcarbazepine 150 MG tablet Commonly known as:  TRILEPTAL Take 150 mg by mouth 2 (two) times daily.   predniSONE 20 MG tablet Commonly known as:  DELTASONE Take 1 tablet (20 mg total) by mouth daily with breakfast.   pregabalin 150 MG capsule Commonly known as:  LYRICA Take 150 mg by mouth 2 (two)  times daily.   spironolactone 25 MG tablet Commonly known as:  ALDACTONE Take 1 tablet (25 mg total) by mouth daily.   torsemide 20 MG tablet Commonly known as:  DEMADEX Take 4 tablets (80 mg total) by mouth daily. Take extra tablet once weekly AS NEEDED for weight 178 lbs or more. What changed:  additional instructions       Discharge Instructions: Please refer to Patient Instructions section of EMR for full details.  Patient was counseled important signs and symptoms that should prompt return to medical care, changes in medications, dietary instructions, activity restrictions, and follow up appointments.   Follow-Up Appointments: PCP - Dr. [name], please follow up  in 5 days.  Nuala Alpha, DO 03/20/2018, 9:20 AM PGY-1, [place]

## 2018-03-23 LAB — CULTURE, BLOOD (ROUTINE X 2)
CULTURE: NO GROWTH
Culture: NO GROWTH
SPECIAL REQUESTS: ADEQUATE
Special Requests: ADEQUATE

## 2018-03-27 ENCOUNTER — Other Ambulatory Visit (HOSPITAL_COMMUNITY): Payer: Self-pay | Admitting: *Deleted

## 2018-03-27 MED ORDER — TORSEMIDE 20 MG PO TABS: 80.0000 mg | ORAL_TABLET | Freq: Every day | ORAL | 0 refills | Status: DC

## 2018-03-29 ENCOUNTER — Other Ambulatory Visit (HOSPITAL_COMMUNITY): Payer: Self-pay

## 2018-03-29 MED ORDER — TORSEMIDE 10 MG PO TABS: 80.0000 mg | ORAL_TABLET | Freq: Every day | ORAL | 0 refills | Status: DC

## 2018-04-02 ENCOUNTER — Ambulatory Visit (HOSPITAL_COMMUNITY)
Admission: RE | Admit: 2018-04-02 | Discharge: 2018-04-02 | Disposition: A | Discharge status: Home / Self Care | Payer: Medicare Other | Source: Ambulatory Visit | Attending: Cardiology | Admitting: Cardiology

## 2018-04-02 VITALS — BP 110/78 | HR 83 | Wt 189.8 lb

## 2018-04-02 DIAGNOSIS — E859 Amyloidosis, unspecified: Secondary | ICD-10-CM

## 2018-04-02 DIAGNOSIS — I5043 Acute on chronic combined systolic (congestive) and diastolic (congestive) heart failure: Secondary | ICD-10-CM

## 2018-04-02 DIAGNOSIS — Z79899 Other long term (current) drug therapy: Secondary | ICD-10-CM | POA: Insufficient documentation

## 2018-04-02 DIAGNOSIS — I444 Left anterior fascicular block: Secondary | ICD-10-CM | POA: Diagnosis not present

## 2018-04-02 DIAGNOSIS — I5042 Chronic combined systolic (congestive) and diastolic (congestive) heart failure: Secondary | ICD-10-CM | POA: Diagnosis not present

## 2018-04-02 DIAGNOSIS — Z87891 Personal history of nicotine dependence: Secondary | ICD-10-CM | POA: Insufficient documentation

## 2018-04-02 DIAGNOSIS — I5022 Chronic systolic (congestive) heart failure: Secondary | ICD-10-CM | POA: Diagnosis present

## 2018-04-02 DIAGNOSIS — Z96653 Presence of artificial knee joint, bilateral: Secondary | ICD-10-CM | POA: Insufficient documentation

## 2018-04-02 DIAGNOSIS — Z7984 Long term (current) use of oral hypoglycemic drugs: Secondary | ICD-10-CM | POA: Diagnosis not present

## 2018-04-02 DIAGNOSIS — E1140 Type 2 diabetes mellitus with diabetic neuropathy, unspecified: Secondary | ICD-10-CM | POA: Insufficient documentation

## 2018-04-02 DIAGNOSIS — I440 Atrioventricular block, first degree: Secondary | ICD-10-CM | POA: Diagnosis not present

## 2018-04-02 DIAGNOSIS — Z9049 Acquired absence of other specified parts of digestive tract: Secondary | ICD-10-CM | POA: Diagnosis not present

## 2018-04-02 DIAGNOSIS — M109 Gout, unspecified: Secondary | ICD-10-CM | POA: Diagnosis not present

## 2018-04-02 DIAGNOSIS — Z7982 Long term (current) use of aspirin: Secondary | ICD-10-CM | POA: Diagnosis not present

## 2018-04-02 DIAGNOSIS — I428 Other cardiomyopathies: Secondary | ICD-10-CM | POA: Insufficient documentation

## 2018-04-02 DIAGNOSIS — Z95810 Presence of automatic (implantable) cardiac defibrillator: Secondary | ICD-10-CM | POA: Insufficient documentation

## 2018-04-02 DIAGNOSIS — E1151 Type 2 diabetes mellitus with diabetic peripheral angiopathy without gangrene: Secondary | ICD-10-CM | POA: Diagnosis not present

## 2018-04-02 DIAGNOSIS — I6523 Occlusion and stenosis of bilateral carotid arteries: Secondary | ICD-10-CM | POA: Insufficient documentation

## 2018-04-02 DIAGNOSIS — I493 Ventricular premature depolarization: Secondary | ICD-10-CM | POA: Insufficient documentation

## 2018-04-02 DIAGNOSIS — I2510 Atherosclerotic heart disease of native coronary artery without angina pectoris: Secondary | ICD-10-CM | POA: Diagnosis not present

## 2018-04-02 DIAGNOSIS — J449 Chronic obstructive pulmonary disease, unspecified: Secondary | ICD-10-CM | POA: Diagnosis not present

## 2018-04-02 DIAGNOSIS — I779 Disorder of arteries and arterioles, unspecified: Secondary | ICD-10-CM | POA: Diagnosis not present

## 2018-04-02 DIAGNOSIS — E785 Hyperlipidemia, unspecified: Secondary | ICD-10-CM | POA: Insufficient documentation

## 2018-04-02 LAB — BASIC METABOLIC PANEL
ANION GAP: 13 (ref 5–15)
BUN: 23 mg/dL (ref 8–23)
CALCIUM: 9.3 mg/dL (ref 8.9–10.3)
CO2: 29 mmol/L (ref 22–32)
Chloride: 100 mmol/L (ref 98–111)
Creatinine, Ser: 1.13 mg/dL — ABNORMAL HIGH (ref 0.44–1.00)
GFR, EST AFRICAN AMERICAN: 56 mL/min — AB (ref >60)
GFR, EST NON AFRICAN AMERICAN: 48 mL/min — AB (ref >60)
Glucose, Bld: 251 mg/dL — ABNORMAL HIGH (ref 70–99)
POTASSIUM: 4.4 mmol/L (ref 3.5–5.1)
SODIUM: 142 mmol/L (ref 135–145)

## 2018-04-02 MED ORDER — FUROSEMIDE 10 MG/ML IJ SOLN
80.0000 mg | Freq: Once | INTRAMUSCULAR | Status: AC
  Administered 2018-04-02: 80 mg via INTRAVENOUS
  Filled 2018-04-02: qty 8

## 2018-04-02 MED ORDER — TORSEMIDE 20 MG PO TABS: ORAL_TABLET | ORAL | 3 refills | Status: DC

## 2018-04-02 MED ORDER — POTASSIUM CHLORIDE CRYS ER 20 MEQ PO TBCR: 20.0000 meq | EXTENDED_RELEASE_TABLET | Freq: Every day | ORAL | 3 refills | Status: DC

## 2018-04-02 MED ORDER — POTASSIUM CHLORIDE CRYS ER 20 MEQ PO TBCR
40.0000 meq | EXTENDED_RELEASE_TABLET | Freq: Once | ORAL | Status: AC
  Administered 2018-04-02: 40 meq via ORAL
  Filled 2018-04-02: qty 2

## 2018-04-02 NOTE — Patient Instructions (Addendum)
Increase Torsemide 80 mg (4 tabs), twice a day for 2 days  -Then 80 mg (4 tabs) in AM, 40 mg (2 tabs) in PM   Take Potassium 40 meq (2 tabs) daily for 2 days  -Then 20 meq daily  Labs drawn today (if we do not call you, then your lab work was stable)   Your physician recommends that you schedule a follow-up appointment in: 1 week with APP and Labs

## 2018-04-02 NOTE — Progress Notes (Signed)
PCP: Dr. Loistine Simas Cardiology: Dr. Geraldo Pitter HF Cardiology: Dr. Aundra Dubin  72 yo with history of PAD, COPD, carotid stenosis, and chronic systolic CHF was referred by Dr. Geraldo Pitter for evaluation/treatment of CHF.    Patient has a long history of vascular disease.  She had left-right fem-fem bypass in 3/16. She has moderate bilateral carotid stenosis.  Echo in 2/18 showed EF 20-25%.  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  She was admitted in 9/18 to the hospital in [facility] for about 4 days with acute on chronic systolic CHF.    RHC/LHC in 12/18 showed nonobstructive coronary disease, low filling pressures, and CI 2.0.   Admitted 09/20/17 with volume overload. Diuresed with IV lasix. Weight went down 2 pounds. Discharged on torsemide 40 mg daily. Discharge weight 179 pounds.   Echo in 12/18 with EF 20-25%, mild basal ventricular hypertrophy, severe mid to apical LV hypertrophy.  Repeat echo in 2/19 was done with Definity contrast, showing that what was thought to be apical hypertrophy was actually prominent mid-apical trabeculation suggestive of noncompaction.   She was noted to have Bence-Jones protein on UPEP.  However, SPEP was negative and 24 hours UPEP was also negative. Abdominal fat pad biopsy did not show evidence of amyloidosis.  She saw heme-onc, probably does not have AL amyloidosis.  However, in 2/19, she had TcPYP scan that was strongly suggestive of transthyretin amyloidosis. She has peripheral neuropathy that may be due to amyloidosis as well. 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   Admitted 6/24-6/26/19 for LLL PNA. She received IV abx and prednisone. Transitioned to omnicef 300 mg BID and azirthromycin 250 mg daily. BP medications were held, but restarted at discharge.   She returns today for HF follow up. Last visit, torsemide and spiro were increased. She had a few episodes of left-sided CP (7/10) at rest yesterday. Each lasted a few  seconds. No associated symptoms. She took a goody powder and it resolved. SOB is getting worse. She is SOB with hills and walking longer distances. No SOB with ADL's or short walks. She has BLE edema. No orthopnea. No fever or chills. She still has a nonproductive cough. She still has BLE neuropathy. Weights up 12 lbs at home, up to 188 lbs. Taking all medications. She has been eating high salt foods due to visiting her daughter in the ICU here. Limiting fluid intake.   Labs (8/18): LDL 80, HDL 46 Labs (10/18): K 4.2, creatinine 1.1 Labs (11/18): digoxin 0.3 Labs (12/18): K 3.5, creatinine 1.02, UPEP with faint Bence-Jones protein but 24 hour UPEP negative and SPEP negative.  Labs (1/19): LDL 79, HDL 38, BNP 493, hgb 13.6, K 5.8 => 4.8, creatinine 1.18, digoxin 0.3 Labs (2/19): K 3.3, creatinine 1.06 Labs (3/19): digoxin < 0.2 Labs (4/19): K 5, creatinine 0.97 Labs (6/19): K 3.5, creatinine 1.19  PMH: 1. PAD: L=>R fem-fem bypass in 3/16.   - ABIs (3/18): Mildly abnormal, 0.81 left and 0.85 right.  - ABIs (4/19): 0.68 left and 0.66 right 2. H/o appendectomy 3. COPD: Quit smoking in 2/12.  - High resolution CT chest (2/19): No interstitial lung disease, +emphysema.  4. Arthritis: Bilateral TKR 5. Diet-controlled diabetes 6. Hyperlipidemia 7. Carotid stenosis:  - 10/18 Carotid dopplers: 60-79% RICA stenosis, 16-10% LICA stenosis, left subclavian steal.  - 4/19 Carotid dopplers: 96-04% RICA, 54-09% LICA, left subclavian stenosis.  8. Chronic systolic CHF: Echo (8/11) with EF 20-25%, mild LV dilation, normal RV size and systolic function. 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 .  - LHC (10/17): Nonobstructive CAD.  - Biotronik ICD.  - LHC/RHC (12/18): 50% proximal PLOM (no obstructive disease). Mean RA 1, PA 32/10 mean 19, mean PCWP 7, CI 2.0.  - Echo (12/18): EF 20-25%, mild basal LV hypertrophy, severe mid to apical LV hypertrophy, moderate diastolic  dysfunction, mild MR, PASP 42 mmHg.  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  - Workup appears negative for AL amyloidosis.  Negative abdominal fat pad biopsy.  However, PYP scan was suggestive of transthyretin amyloidosis.  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  10. Peripheral neuropathy: ?due to amyloidosis.  11. Gout 12. H/o Bell's palsy  SH: Lives with husband in [place], 3 daughters.  Quit smoking in 2012.    Family History  Problem Relation Age of Onset  . Heart disease Mother   . Hypertension Mother   Daughter with cardiomyopathy. Nephews with CAD.   Review of systems complete and found to be negative unless listed in HPI.   Current Outpatient Medications  Medication Sig Dispense Refill  . acetaminophen (TYLENOL) 650 MG CR tablet Take 650 mg by mouth every 8 (eight) hours as needed for pain.    [name] albuterol (PROVENTIL HFA;VENTOLIN HFA) 108 (90 Base) MCG/ACT inhaler Inhale 2 puffs into the lungs every 6 (six) hours as needed for wheezing or shortness of breath. 1 Inhaler 6  . albuterol (PROVENTIL) (2.5 MG/3ML) 0.083% nebulizer solution Take 3 mLs by nebulization every 4 (four) hours as needed for wheezing.    [name] aspirin EC 81 MG tablet Take 162 mg by mouth daily.    [name] atorvastatin (LIPITOR) 20 MG tablet TAKE 1 TABLET BY MOUTH DAILY 30 tablet 3  . carvedilol (COREG) 12.5 MG tablet TAKE ONE (1) TABLET BY MOUTH TWO (2) TIMES DAILY 180 tablet 1  . Difluprednate 0.05 % EMUL Apply 1 drop to eye daily.    [name] DIGOX 125 MCG tablet TAKE 1/2 TABLET BY MOUTH ONCE DAILY ON MONDAYS, WEDNESDAYS, AND FRIDAYS 27 tablet 6  . fluticasone furoate-vilanterol (BREO ELLIPTA) 200-25 MCG/INH AEPB Inhale 1 puff into the lungs daily.    [name] glimepiride (AMARYL) 2 MG  tablet Take 2 mg by mouth daily.    [name] losartan (COZAAR) 25 MG tablet Take 1 tablet (25 mg total) by mouth at bedtime. 30 tablet 3  . ofloxacin (OCUFLOX) 0.3 % ophthalmic solution Place 1 drop into both eyes 4 (four) times daily.    . OXcarbazepine (TRILEPTAL) 150 MG tablet Take 150 mg by mouth 2 (two) times daily.    . pregabalin (LYRICA) 150 MG capsule Take 150 mg by mouth 2 (two) times daily.    [name] spironolactone (ALDACTONE) 25 MG tablet Take 1 tablet (25 mg total) by mouth daily. 30 tablet 3  . torsemide (DEMADEX) 10 MG tablet Take 8 tablets (80 mg total) by mouth daily. Take extra tablet once weekly AS NEEDED for weight 178 lbs or more. 240 tablet 0   No current facility-administered medications for this encounter.    BP 110/78   Pulse 83   Wt 189 lb 12.8 oz (86.1 kg)   SpO2 93%   BMI 30.63 kg/m   Wt Readings from Last 3 Encounters:  04/02/18 189 lb 12.8 oz (86.1 kg)  03/18/18 185 lb  6.5 oz (84.1 kg)  02/12/18 184 lb 4 oz (83.6 kg)   General: Well appearing. No resp difficulty. HEENT: Normal Neck: Supple. JVP 14. Carotids 2+ bilat; no bruits. No thyromegaly or nodule noted. Cor: PMI nondisplaced. RRR, 1/6 SEM RUSB Lungs: CTAB, normal effort. Abdomen: Soft, non-tender, non-distended, no HSM. No bruits or masses. +BS  Extremities: No cyanosis, clubbing, or rash. R and LLE 1+ edema to knees.  Neuro: Alert & orientedx3, cranial nerves grossly intact. moves all 4 extremities w/o difficulty. Affect pleasant  EKG: NSR 70 bpm with 1st degree AV block and PVCs.   Assessment/Plan: 1. Chronic systolic CHF: Nonischemic cardiomyopathy.  EF 20-25% on echo in 2/18.  LHC/RHC in 12/18 with low filling pressures, CI 2.0, and nonobstructive CAD. She has a Biotronik ICD.  Also of note, daughter and nephew have sarcoidosis => patient had a chest CT in 2/19 without evidence for pulmonary sarcoidosis.  We are unable to do an MRI to look for infiltrative disease due to ICD.  Echo in 12/18 showed EF  20-25%, noted to have LVH pattern that is mild in the basal segments and moderate to severe in the apical segments. This was concerning for apical HCM and study was repeated in 2/19 with Definity contrast.  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 We also did a workup for amyloidosis. She did not have evidence for AL amyloidosis, but TcPYP scan in 2/19 was strongly suggestive of transthyretin amyloidosis.  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 Genetic testing was positive, suggesting hereditary TTR amyloidosis.  - NYHA class III - Volume status elevated. Give 80 mg IV lasix today with 40 meq potassium  - Increase torsemide to 80 mg BID starting tomorrow x 2 days, then 80 mg am, 40 mg pm. BMET today and in 7 days.  - Start 40 meq potassium tomorrow x 2 days, then 20 meq daily.  - Continue losartan 25 mg qhs. She did not tolerate low dose Entresto.  - Continue spironolactone to 25 mg daily.  - Continue digoxin, check Dig level today - Continue Coreg 12.5 mg bid.   2. PAD: s/p fem-fem bypass, followed by [facility].  No claudication, last ABIs somewhat worse in 4/19. She does not have clear claudication, pain in legs seems to be related to peripheral neuropathy and arthritis.  Continue to followup with [facility]. No change.  3. Carotid stenosis: Stable moderate stenosis on doppers. Follows with [facility]. No change 4. Hyperlipidemia: Continue atorvastatin. Recent lipids were acceptable (LDL 79 09/2017). No change 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 She has peripheral neuropathy. - Genetic testing came back positive. Tafamidis paperwork is in process.   BMET and dig level today. BMET 7 days 80 mg IV lasix today with 40 meq potassium Increase torsemide to 80 mg BID x 2 days starting tomorrow, then 80 mg am, 40 mg  pm Start potassium 40 meq x 2 days starting tomorrow, then 20 meq daily Follow up in 10 days with APP.  Georgiana Shore, NP 04/02/2018   Patient seen with NP, agree with the above note.    NYHA class IIIb symptoms, somewhat worse.  Weight is up.  Has been eating poorly as she has been back and forth to the hospital visiting her daughter.   On exam, she is volume overloaded with JVP 14 cm and peripheral edema.   I  will give her a dose of Lasix 80 mg IV x 1 in the office today.  She will increase torsemide to 80 mg bid x 2 days starting tomorrow, then 80 qam/40 qpm after that.  Add KCl 40 daily x 2 days then 20 daily after that.  BMET/digoxin level today and BMET again in 10 days.   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  Her family is also planning to get genetic testing.  We are working on getting her started on tafamidis.   Followup in 10 days with NP/PA to reassess volume status.   Loralie Champagne  04/02/2018

## 2018-04-03 LAB — DIGOXIN LEVEL: Digoxin Level: <0.2 ng/mL — ABNORMAL LOW (ref 0.8–2.0)

## 2018-04-08 NOTE — Progress Notes (Signed)
Advanced Heart Failure Clinic Note  PCP: Dr. Loistine Simas Cardiology: Dr. Geraldo Pitter HF Cardiology: Dr. Abram Sander is a 72 y.o. female with history of PAD, COPD, carotid stenosis, and chronic systolic CHF was referred by Dr. Geraldo Pitter for evaluation/treatment of CHF.    Patient has a long history of vascular disease.  She had left-right fem-fem bypass in 3/16. She has moderate bilateral carotid stenosis.  Echo in 2/18 showed EF 20-25%.  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  She was admitted in 9/18 to the hospital in [facility] for about 4 days with acute on chronic systolic CHF.    RHC/LHC in 12/18 showed nonobstructive coronary disease, low filling pressures, and CI 2.0.   Admitted 09/20/17 with volume overload. Diuresed with IV lasix. Weight went down 2 pounds. Discharged on torsemide 40 mg daily. Discharge weight 179 pounds.   Echo in 12/18 with EF 20-25%, mild basal ventricular hypertrophy, severe mid to apical LV hypertrophy.  Repeat echo in 2/19 was done with Definity contrast, showing that what was thought to be apical hypertrophy was actually prominent mid-apical trabeculation suggestive of noncompaction.   She was noted to have Bence-Jones protein on UPEP.  However, SPEP was negative and 24 hours UPEP was also negative. Abdominal fat pad biopsy did not show evidence of amyloidosis.  She saw heme-onc, probably does not have AL amyloidosis.  However, in 2/19, she had TcPYP scan that was strongly suggestive of transthyretin amyloidosis. She has peripheral neuropathy that may be due to amyloidosis as well. 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   Admitted 6/24-6/26/19 for LLL PNA. She received IV abx and prednisone. Transitioned to omnicef 300 mg BID and azirthromycin 250 mg daily. BP medications were held, but restarted at discharge.   She returns today for HF follow up. Seen last week with volume overload and received 80 mg IV  lasix in clinic. Torsemide was also increased. She lost 6 lbs after the IV lasix. She was supposed to be taking torsemide 80 mg am, 40 mg pm, but has been taking 90 mg in the morning. Overall doing okay. Denies SOB except with hills and stairs. She denies orthopnea. BLE edema has improved. She still has a dry cough. Denies fever or chills. She gets dizzy with walking, but this is unchanged. No dizziness with sitting to standing. She had an episode of chest discomfort after using her husband's albuterol, but none since. Weights are 184 lbs at home now. She has been doing better with limiting fluid and salt, but has been eating a lot of cake. Taking all medications.   ICD: Thoracic impedence elevated. No VT/VF. Active 12% of the day. Personally reviewed.   Labs (8/18): LDL 80, HDL 46 Labs (10/18): K 4.2, creatinine 1.1 Labs (11/18): digoxin 0.3 Labs (12/18): K 3.5, creatinine 1.02, UPEP with faint Bence-Jones protein but 24 hour UPEP negative and SPEP negative.  Labs (1/19): LDL 79, HDL 38, BNP 493, hgb 13.6, K 5.8 => 4.8, creatinine 1.18, digoxin 0.3 Labs (2/19): K 3.3, creatinine 1.06 Labs (3/19): digoxin < 0.2 Labs (4/19): K 5, creatinine 0.97 Labs (6/19): K 3.5, creatinine 1.19  PMH: 1. PAD: L=>R fem-fem bypass in 3/16.   - ABIs (3/18): Mildly abnormal, 0.81 left and 0.85 right.  - ABIs (4/19): 0.68 left and 0.66 right 2. H/o appendectomy 3. COPD: Quit smoking in 2/12.  - High resolution CT chest (2/19): No interstitial lung disease, +emphysema.  4. Arthritis: Bilateral TKR 5. Diet-controlled diabetes  6. Hyperlipidemia 7. Carotid stenosis:  - 10/18 Carotid dopplers: 60-79% RICA stenosis, 87-56% LICA stenosis, left subclavian steal.  - 4/19 Carotid dopplers: 43-32% RICA, 95-18% LICA, left subclavian stenosis.  8. Chronic systolic CHF: Echo (8/41) with EF 20-25%, mild LV dilation, normal RV size and systolic function. NICM => possible noncompaction, also appears to have transthyretin  amyloidosis. .  - LHC (10/17): Nonobstructive CAD.  - Biotronik ICD.  - LHC/RHC (12/18): 50% proximal PLOM (no obstructive disease). Mean RA 1, PA 32/10 mean 19, mean PCWP 7, CI 2.0.  - Echo (12/18): EF 20-25%, mild basal LV hypertrophy, severe mid to apical LV hypertrophy, moderate diastolic dysfunction, mild MR, PASP 42 mmHg.  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  - Workup appears negative for AL amyloidosis.  Negative abdominal fat pad biopsy.  However, PYP scan was suggestive of transthyretin amyloidosis.  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  10. Peripheral neuropathy: ?due to amyloidosis.  11. Gout 12. H/o Bell's palsy  SH: Lives with husband in [place], 3 daughters.  Quit smoking in 2012.    Family History  Problem Relation Age of Onset  . Heart disease Mother   . Hypertension Mother   Daughter with cardiomyopathy. Nephews with CAD.   Review of systems complete and found to be negative unless listed in HPI.   Current Outpatient Medications  Medication Sig Dispense Refill  . acetaminophen (TYLENOL) 650 MG CR tablet Take 650 mg by mouth every 8 (eight) hours as needed for pain.    [name] albuterol (PROVENTIL HFA;VENTOLIN HFA) 108 (90 Base) MCG/ACT inhaler Inhale 2 puffs into the lungs every 6 (six) hours as needed for wheezing or shortness of breath. 1 Inhaler 6  . albuterol (PROVENTIL) (2.5 MG/3ML) 0.083% nebulizer solution Take 3 mLs by nebulization every 4 (four) hours as needed for wheezing.    [name] aspirin EC 81 MG tablet Take 162 mg by mouth daily.    [name] atorvastatin (LIPITOR) 20 MG tablet TAKE 1 TABLET BY MOUTH DAILY 30 tablet 3  . carvedilol (COREG) 12.5 MG tablet TAKE ONE (1) TABLET BY MOUTH TWO (2) TIMES DAILY 180 tablet 1  .  Difluprednate 0.05 % EMUL Apply 1 drop to eye daily.    [name] DIGOX 125 MCG tablet TAKE 1/2 TABLET BY MOUTH ONCE DAILY ON MONDAYS, WEDNESDAYS, AND FRIDAYS 27 tablet 6  . fluticasone furoate-vilanterol (BREO ELLIPTA) 200-25 MCG/INH AEPB Inhale 1 puff into the lungs daily.    [name] glimepiride (AMARYL) 2 MG tablet Take 2 mg by mouth daily.    [name] losartan (COZAAR) 25 MG tablet Take 1 tablet (25 mg total) by mouth at bedtime. 30 tablet 3  . ofloxacin (OCUFLOX) 0.3 % ophthalmic solution Place 1 drop into both eyes 4 (four) times daily.    . OXcarbazepine (TRILEPTAL) 150 MG tablet Take 150 mg by mouth 2 (two) times daily.    . potassium chloride SA (K-DUR,KLOR-CON) 20 MEQ tablet Take 1 tablet (20 mEq total) by mouth daily. 30 tablet 3  . pregabalin (LYRICA) 150 MG capsule Take 150 mg by mouth 2 (two) times daily.    [name] spironolactone (ALDACTONE) 25 MG tablet Take 1 tablet (25 mg total) by mouth daily. 30 tablet 3  . torsemide (DEMADEX) 10 MG tablet Take 90 mg by mouth daily.  No current facility-administered medications for this encounter.    BP 100/72 (BP Location: Right Arm, Patient Position: Sitting, Cuff Size: Large) Comment: no am meds  Pulse 64   Wt 184 lb 7 oz (83.7 kg)   SpO2 96%   BMI 29.77 kg/m   Wt Readings from Last 3 Encounters:  04/09/18 184 lb 7 oz (83.7 kg)  04/02/18 189 lb 12.8 oz (86.1 kg)  03/18/18 185 lb 6.5 oz (84.1 kg)   General: Well appearing. No resp difficulty. HEENT: Normal Neck: Supple. JVP 5-6. Carotids 2+ bilat; no bruits. No thyromegaly or nodule noted. Cor: PMI nondisplaced. RRR, 1/6 SEM RUSB Lungs: CTAB, normal effort. Abdomen: Soft, non-tender, non-distended, no HSM. No bruits or masses. +BS  Extremities: No cyanosis, clubbing, or rash. R and LLE trace ankle edema Neuro: Alert & orientedx3, cranial nerves grossly intact. moves all 4 extremities w/o difficulty. Affect pleasant  Assessment/Plan: 1. Chronic systolic CHF: Nonischemic cardiomyopathy.  EF 20-25% on  echo in 2/18.  LHC/RHC in 12/18 with low filling pressures, CI 2.0, and nonobstructive CAD. She has a Biotronik ICD.  Also of note, daughter and nephew have sarcoidosis => patient had a chest CT in 2/19 without evidence for pulmonary sarcoidosis.  We are unable to do an MRI to look for infiltrative disease due to ICD.  Echo in 12/18 showed EF 20-25%, noted to have LVH pattern that is mild in the basal segments and moderate to severe in the apical segments. This was concerning for apical HCM and study was repeated in 2/19 with Definity contrast.  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 We also did a workup for amyloidosis. She did not have evidence for AL amyloidosis, but TcPYP scan in 2/19 was strongly suggestive of transthyretin amyloidosis.  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 Genetic testing was positive, suggesting hereditary TTR amyloidosis.  - NYHA class III - Volume status stable on exam. Thoracic impedence elevated on ICD.  - Decrease torsemide back to 80 mg daily. BMET today.  - Continue losartan 25 mg qhs. She did not tolerate low dose Entresto.  - Continue spironolactone 25 mg daily.  - Continue digoxin on MWF, dig level <0.2 03/2018 - Continue Coreg 12.5 mg bid.   2. PAD: s/p fem-fem bypass, followed by [facility].  No claudication, last ABIs somewhat worse in 4/19. She does not have clear claudication, pain in legs seems to be related to peripheral neuropathy and arthritis.  Continue to followup with [facility]. No change.  3. Carotid stenosis: Stable moderate stenosis on doppers. Follows with [facility]. No change.  4. Hyperlipidemia: Continue atorvastatin. Recent lipids were acceptable (LDL 79 09/2017). No change.  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 She has peripheral neuropathy. - Genetic testing came  back positive. Her family plans to get tested.  - Tafamidis paperwork in process. Confirmed with Nira Conn, RN.   BMET today Decrease torsemide back to 80 mg daily Follow up in 2 weeks  Georgiana Shore, NP   Greater than 50% of the 25 minute visit was spent in counseling/coordination of care regarding disease state education, salt/fluid restriction, sliding scale diuretics, and medication compliance.

## 2018-04-09 ENCOUNTER — Ambulatory Visit (HOSPITAL_COMMUNITY)
Admission: RE | Admit: 2018-04-09 | Discharge: 2018-04-09 | Disposition: A | Discharge status: Home / Self Care | Payer: Medicare Other | Source: Ambulatory Visit | Attending: Cardiology | Admitting: Cardiology

## 2018-04-09 ENCOUNTER — Encounter (HOSPITAL_COMMUNITY): Payer: Self-pay

## 2018-04-09 VITALS — BP 100/72 | HR 64 | Wt 184.4 lb

## 2018-04-09 DIAGNOSIS — Z96653 Presence of artificial knee joint, bilateral: Secondary | ICD-10-CM | POA: Diagnosis not present

## 2018-04-09 DIAGNOSIS — I43 Cardiomyopathy in diseases classified elsewhere: Secondary | ICD-10-CM | POA: Insufficient documentation

## 2018-04-09 DIAGNOSIS — I6523 Occlusion and stenosis of bilateral carotid arteries: Secondary | ICD-10-CM | POA: Insufficient documentation

## 2018-04-09 DIAGNOSIS — E1151 Type 2 diabetes mellitus with diabetic peripheral angiopathy without gangrene: Secondary | ICD-10-CM | POA: Diagnosis not present

## 2018-04-09 DIAGNOSIS — Z9049 Acquired absence of other specified parts of digestive tract: Secondary | ICD-10-CM | POA: Insufficient documentation

## 2018-04-09 DIAGNOSIS — Z7982 Long term (current) use of aspirin: Secondary | ICD-10-CM | POA: Insufficient documentation

## 2018-04-09 DIAGNOSIS — E859 Amyloidosis, unspecified: Secondary | ICD-10-CM | POA: Diagnosis not present

## 2018-04-09 DIAGNOSIS — E785 Hyperlipidemia, unspecified: Secondary | ICD-10-CM | POA: Diagnosis not present

## 2018-04-09 DIAGNOSIS — I2510 Atherosclerotic heart disease of native coronary artery without angina pectoris: Secondary | ICD-10-CM | POA: Insufficient documentation

## 2018-04-09 DIAGNOSIS — I5042 Chronic combined systolic (congestive) and diastolic (congestive) heart failure: Secondary | ICD-10-CM

## 2018-04-09 DIAGNOSIS — Z95810 Presence of automatic (implantable) cardiac defibrillator: Secondary | ICD-10-CM | POA: Insufficient documentation

## 2018-04-09 DIAGNOSIS — E854 Organ-limited amyloidosis: Secondary | ICD-10-CM | POA: Insufficient documentation

## 2018-04-09 DIAGNOSIS — I428 Other cardiomyopathies: Secondary | ICD-10-CM | POA: Diagnosis not present

## 2018-04-09 DIAGNOSIS — E1140 Type 2 diabetes mellitus with diabetic neuropathy, unspecified: Secondary | ICD-10-CM | POA: Diagnosis not present

## 2018-04-09 DIAGNOSIS — J449 Chronic obstructive pulmonary disease, unspecified: Secondary | ICD-10-CM | POA: Insufficient documentation

## 2018-04-09 DIAGNOSIS — Z79899 Other long term (current) drug therapy: Secondary | ICD-10-CM | POA: Diagnosis not present

## 2018-04-09 DIAGNOSIS — Z7984 Long term (current) use of oral hypoglycemic drugs: Secondary | ICD-10-CM | POA: Insufficient documentation

## 2018-04-09 DIAGNOSIS — I5022 Chronic systolic (congestive) heart failure: Secondary | ICD-10-CM | POA: Insufficient documentation

## 2018-04-09 DIAGNOSIS — Z87891 Personal history of nicotine dependence: Secondary | ICD-10-CM | POA: Insufficient documentation

## 2018-04-09 LAB — BASIC METABOLIC PANEL
ANION GAP: 15 (ref 5–15)
BUN: 29 mg/dL — ABNORMAL HIGH (ref 8–23)
CHLORIDE: 101 mmol/L (ref 98–111)
CO2: 24 mmol/L (ref 22–32)
Calcium: 9.4 mg/dL (ref 8.9–10.3)
Creatinine, Ser: 1.14 mg/dL — ABNORMAL HIGH (ref 0.44–1.00)
GFR calc non Af Amer: 48 mL/min — ABNORMAL LOW (ref >60)
GFR, EST AFRICAN AMERICAN: 55 mL/min — AB (ref >60)
Glucose, Bld: 141 mg/dL — ABNORMAL HIGH (ref 70–99)
POTASSIUM: 4.4 mmol/L (ref 3.5–5.1)
Sodium: 140 mmol/L (ref 135–145)

## 2018-04-09 MED ORDER — TORSEMIDE 10 MG PO TABS: 80.0000 mg | ORAL_TABLET | Freq: Every day | ORAL | 3 refills | Status: DC

## 2018-04-09 MED ORDER — TORSEMIDE 10 MG PO TABS: ORAL_TABLET | ORAL | 3 refills | Status: DC

## 2018-04-09 NOTE — Patient Instructions (Addendum)
CHANGE Torsemide to 80 mg (8 tabs) daily   Labs today We will only contact you if something comes back abnormal or we need to make some changes. Otherwise no news is good news!   Your physician recommends that you schedule a follow-up appointment in: 2 weeks with Ashley,NP  Do the following things EVERYDAY: 1) Weigh yourself in the morning before breakfast. Write it down and keep it in a log. 2) Take your medicines as prescribed 3) Eat low salt foods-Limit salt (sodium) to 2000 mg per day.  4) Stay as active as you can everyday 5) Limit all fluids for the day to less than 2 liters

## 2018-04-11 ENCOUNTER — Other Ambulatory Visit: Payer: Self-pay | Admitting: Cardiology

## 2018-04-15 ENCOUNTER — Other Ambulatory Visit (HOSPITAL_COMMUNITY): Payer: Self-pay | Admitting: *Deleted

## 2018-04-15 MED ORDER — TORSEMIDE 10 MG PO TABS: 80.0000 mg | ORAL_TABLET | Freq: Every day | ORAL | 3 refills | Status: DC

## 2018-04-17 ENCOUNTER — Ambulatory Visit (INDEPENDENT_AMBULATORY_CARE_PROVIDER_SITE_OTHER): Payer: Medicare Other | Admitting: *Deleted

## 2018-04-17 DIAGNOSIS — I428 Other cardiomyopathies: Secondary | ICD-10-CM | POA: Diagnosis not present

## 2018-04-17 NOTE — Progress Notes (Signed)
Remote ICD transmission.   

## 2018-04-18 ENCOUNTER — Telehealth (HOSPITAL_COMMUNITY): Payer: Self-pay | Admitting: Internal Medicine

## 2018-04-23 ENCOUNTER — Ambulatory Visit (HOSPITAL_COMMUNITY)
Admission: RE | Admit: 2018-04-23 | Discharge: 2018-04-23 | Disposition: A | Discharge status: Home / Self Care | Payer: Medicare Other | Source: Ambulatory Visit | Attending: Cardiology | Admitting: Cardiology

## 2018-04-23 VITALS — BP 96/62 | HR 60 | Wt 185.0 lb

## 2018-04-23 DIAGNOSIS — Z96653 Presence of artificial knee joint, bilateral: Secondary | ICD-10-CM | POA: Insufficient documentation

## 2018-04-23 DIAGNOSIS — Z7984 Long term (current) use of oral hypoglycemic drugs: Secondary | ICD-10-CM | POA: Insufficient documentation

## 2018-04-23 DIAGNOSIS — I5042 Chronic combined systolic (congestive) and diastolic (congestive) heart failure: Secondary | ICD-10-CM

## 2018-04-23 DIAGNOSIS — E1140 Type 2 diabetes mellitus with diabetic neuropathy, unspecified: Secondary | ICD-10-CM | POA: Insufficient documentation

## 2018-04-23 DIAGNOSIS — E785 Hyperlipidemia, unspecified: Secondary | ICD-10-CM

## 2018-04-23 DIAGNOSIS — Z87891 Personal history of nicotine dependence: Secondary | ICD-10-CM | POA: Insufficient documentation

## 2018-04-23 DIAGNOSIS — Z95810 Presence of automatic (implantable) cardiac defibrillator: Secondary | ICD-10-CM | POA: Insufficient documentation

## 2018-04-23 DIAGNOSIS — I6523 Occlusion and stenosis of bilateral carotid arteries: Secondary | ICD-10-CM | POA: Diagnosis not present

## 2018-04-23 DIAGNOSIS — I428 Other cardiomyopathies: Secondary | ICD-10-CM | POA: Diagnosis not present

## 2018-04-23 DIAGNOSIS — Z79899 Other long term (current) drug therapy: Secondary | ICD-10-CM | POA: Diagnosis not present

## 2018-04-23 DIAGNOSIS — Z9049 Acquired absence of other specified parts of digestive tract: Secondary | ICD-10-CM | POA: Diagnosis not present

## 2018-04-23 DIAGNOSIS — J449 Chronic obstructive pulmonary disease, unspecified: Secondary | ICD-10-CM | POA: Insufficient documentation

## 2018-04-23 DIAGNOSIS — E8589 Other amyloidosis: Secondary | ICD-10-CM

## 2018-04-23 DIAGNOSIS — M109 Gout, unspecified: Secondary | ICD-10-CM | POA: Diagnosis not present

## 2018-04-23 DIAGNOSIS — Z7982 Long term (current) use of aspirin: Secondary | ICD-10-CM | POA: Insufficient documentation

## 2018-04-23 DIAGNOSIS — Z8249 Family history of ischemic heart disease and other diseases of the circulatory system: Secondary | ICD-10-CM | POA: Insufficient documentation

## 2018-04-23 DIAGNOSIS — I739 Peripheral vascular disease, unspecified: Secondary | ICD-10-CM | POA: Diagnosis not present

## 2018-04-23 DIAGNOSIS — I5022 Chronic systolic (congestive) heart failure: Secondary | ICD-10-CM | POA: Insufficient documentation

## 2018-04-23 DIAGNOSIS — I2510 Atherosclerotic heart disease of native coronary artery without angina pectoris: Secondary | ICD-10-CM | POA: Insufficient documentation

## 2018-04-23 DIAGNOSIS — Z7951 Long term (current) use of inhaled steroids: Secondary | ICD-10-CM | POA: Diagnosis not present

## 2018-04-23 NOTE — Patient Instructions (Signed)
Your physician recommends that you schedule a follow-up appointment in: October with Dr. Aundra Dubin   Do the following things EVERYDAY: 1) Weigh yourself in the morning before breakfast. Write it down and keep it in a log. 2) Take your medicines as prescribed 3) Eat low salt foods-Limit salt (sodium) to 2000 mg per day.  4) Stay as active as you can everyday 5) Limit all fluids for the day to less than 2 liters

## 2018-04-23 NOTE — Progress Notes (Signed)
Advanced Heart Failure Clinic Note  PCP: Dr. Loistine Simas Cardiology: Dr. Geraldo Pitter HF Cardiology: Dr. Abram Sander is a 72 y.o. female with history of PAD, COPD, carotid stenosis, and chronic systolic CHF was referred by Dr. Geraldo Pitter for evaluation/treatment of CHF.    Patient has a long history of vascular disease.  She had left-right fem-fem bypass in 3/16. She has moderate bilateral carotid stenosis.  Echo in 2/18 showed EF 20-25%.  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  She was admitted in 9/18 to the hospital in [facility] for about 4 days with acute on chronic systolic CHF.    RHC/LHC in 12/18 showed nonobstructive coronary disease, low filling pressures, and CI 2.0.   Admitted 09/20/17 with volume overload. Diuresed with IV lasix. Weight went down 2 pounds. Discharged on torsemide 40 mg daily. Discharge weight 179 pounds.   Echo in 12/18 with EF 20-25%, mild basal ventricular hypertrophy, severe mid to apical LV hypertrophy.  Repeat echo in 2/19 was done with Definity contrast, showing that what was thought to be apical hypertrophy was actually prominent mid-apical trabeculation suggestive of noncompaction.   She was noted to have Bence-Jones protein on UPEP.  However, SPEP was negative and 24 hours UPEP was also negative. Abdominal fat pad biopsy did not show evidence of amyloidosis.  She saw heme-onc, probably does not have AL amyloidosis.  However, in 2/19, she had TcPYP scan that was strongly suggestive of transthyretin amyloidosis. She has peripheral neuropathy that may be due to amyloidosis as well. 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   Admitted 6/24-6/26/19 for LLL PNA. She received IV abx and prednisone. Transitioned to omnicef 300 mg BID and azirthromycin 250 mg daily. BP medications were held, but restarted at discharge.   Today she returns for HF follow up. Last visit torsemide was cut back to 80 mg daily.  Overall  feeling fine. Denies PND/Orthopnea. Appetite ok. No fever or chills. Weight at 184-185 pounds. She has been trying to avoid fast food. She has not been walking due to foot pain. Taking all medications.   Labs (8/18): LDL 80, HDL 46 Labs (10/18): K 4.2, creatinine 1.1 Labs (11/18): digoxin 0.3 Labs (12/18): K 3.5, creatinine 1.02, UPEP with faint Bence-Jones protein but 24 hour UPEP negative and SPEP negative.  Labs (1/19): LDL 79, HDL 38, BNP 493, hgb 13.6, K 5.8 => 4.8, creatinine 1.18, digoxin 0.3 Labs (2/19): K 3.3, creatinine 1.06 Labs (3/19): digoxin < 0.2 Labs (4/19): K 5, creatinine 0.97 Labs (6/19): K 3.5, creatinine 1.19 Labs (04/09/18): K 4.4 Torsemide 1.14   PMH: 1. PAD: L=>R fem-fem bypass in 3/16.   - ABIs (3/18): Mildly abnormal, 0.81 left and 0.85 right.  - ABIs (4/19): 0.68 left and 0.66 right 2. H/o appendectomy 3. COPD: Quit smoking in 2/12.  - High resolution CT chest (2/19): No interstitial lung disease, +emphysema.  4. Arthritis: Bilateral TKR 5. Diet-controlled diabetes 6. Hyperlipidemia 7. Carotid stenosis:  - 10/18 Carotid dopplers: 60-79% RICA stenosis, 25-36% LICA stenosis, left subclavian steal.  - 4/19 Carotid dopplers: 64-40% RICA, 34-74% LICA, left subclavian stenosis.  8. Chronic systolic CHF: Echo (2/59) with EF 20-25%, mild LV dilation, normal RV size and systolic function. 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 .  - LHC (10/17): Nonobstructive CAD.  - Biotronik ICD.  - LHC/RHC (12/18): 50% proximal PLOM (no obstructive disease). Mean RA 1, PA 32/10 mean 19, mean PCWP 7, CI 2.0.  -  Echo (12/18): EF 20-25%, mild basal LV hypertrophy, severe mid to apical LV hypertrophy, moderate diastolic dysfunction, mild MR, PASP 42 mmHg.  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  - Workup appears  negative for AL amyloidosis.  Negative abdominal fat pad biopsy.  However, PYP scan was suggestive of transthyretin amyloidosis.  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  10. Peripheral neuropathy: ?due to amyloidosis.  11. Gout 12. H/o Bell's palsy  SH: Lives with husband in [place], 3 daughters.  Quit smoking in 2012.    Family History  Problem Relation Age of Onset  . Heart disease Mother   . Hypertension Mother   Daughter with cardiomyopathy. Nephews with CAD.   Review of systems complete and found to be negative unless listed in HPI.   Current Outpatient Medications  Medication Sig Dispense Refill  . acetaminophen (TYLENOL) 650 MG CR tablet Take 650 mg by mouth every 8 (eight) hours as needed for pain.    [name] albuterol (PROVENTIL HFA;VENTOLIN HFA) 108 (90 Base) MCG/ACT inhaler Inhale 2 puffs into the lungs every 6 (six) hours as needed for wheezing or shortness of breath. 1 Inhaler 6  . albuterol (PROVENTIL) (2.5 MG/3ML) 0.083% nebulizer solution Take 3 mLs by nebulization every 4 (four) hours as needed for wheezing.    [name] aspirin EC 81 MG tablet Take 162 mg by mouth daily.    [name] atorvastatin (LIPITOR) 20 MG tablet TAKE 1 TABLET BY MOUTH DAILY 30 tablet 4  . carvedilol (COREG) 12.5 MG tablet TAKE ONE (1) TABLET BY MOUTH TWO (2) TIMES DAILY 180 tablet 1  . DIGOX 125 MCG tablet TAKE 1/2 TABLET BY MOUTH ONCE DAILY ON MONDAYS, WEDNESDAYS, AND FRIDAYS 27 tablet 6  . fluticasone furoate-vilanterol (BREO ELLIPTA) 200-25 MCG/INH AEPB Inhale 1 puff into the lungs daily.    [name] glimepiride (AMARYL) 2 MG tablet Take 2 mg by mouth daily.    [name] losartan (COZAAR) 25 MG tablet Take 1 tablet (25 mg total) by mouth at bedtime. 30 tablet 3  . OXcarbazepine (TRILEPTAL) 150 MG tablet Take 150 mg by mouth 2 (two) times daily.    . potassium chloride SA (K-DUR,KLOR-CON) 20 MEQ tablet Take 1  tablet (20 mEq total) by mouth daily. 30 tablet 3  . pregabalin (LYRICA) 150 MG capsule Take 150 mg by mouth 2 (two) times daily.    [name] spironolactone (ALDACTONE) 25 MG tablet Take 1 tablet (25 mg total) by mouth daily. 30 tablet 3  . torsemide (DEMADEX) 10 MG tablet Take 8 tablets (80 mg total) by mouth daily. 120 tablet 3   No current facility-administered medications for this encounter.    BP 96/62   Pulse 60   Wt 185 lb (83.9 kg)   SpO2 94%   BMI 29.86 kg/m   Wt Readings from Last 3 Encounters:  04/23/18 185 lb (83.9 kg)  04/09/18 184 lb 7 oz (83.7 kg)  04/02/18 189 lb 12.8 oz (86.1 kg)   General:  Well appearing. No resp difficulty HEENT: normal Neck: supple. JVP 5-6. Carotids 2+ bilat; no bruits. No lymphadenopathy or thryomegaly appreciated. Cor: PMI nondisplaced. Regular rate & rhythm. No rubs, gallops. 1/6 SEM RUSB. Lungs: clear Abdomen: soft, nontender, nondistended. No hepatosplenomegaly. No bruits or masses. Good bowel sounds. Extremities: no cyanosis, clubbing, rash, RLE and LLE  trace edema Neuro: alert & orientedx3, cranial nerves grossly intact. moves all 4 extremities w/o difficulty. Affect pleasant  Assessment/Plan: 1. Chronic systolic CHF: Nonischemic cardiomyopathy.  EF 20-25% on echo in 2/18.  LHC/RHC in 12/18 with low filling pressures, CI 2.0, and nonobstructive CAD. She has a Biotronik ICD.  Also of note, daughter and nephew have sarcoidosis => patient had a chest CT in 2/19 without evidence for pulmonary sarcoidosis.  We are unable to do an MRI to look for infiltrative disease due to ICD.  Echo in 12/18 showed EF 20-25%, noted to have LVH pattern that is mild in the basal segments and moderate to severe in the apical segments. This was concerning for apical HCM and study was repeated in 2/19 with Definity contrast.  Use of Definity actually showed a pattern of mid to apical prominent trabeculations concerning for noncompaction cardiomyopathy (rather than apical  HCM). We also did a workup for amyloidosis. She did not have evidence for AL amyloidosis, but TcPYP scan in 2/19 was strongly suggestive of transthyretin amyloidosis.  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 Genetic testing was positive, suggesting hereditary TTR amyloidosis.  - NYHA class III. Volume status stable. Continue torsemide 80 mg daily with an extra 40 mg as needed for 3 pound weight gain.  -Continue losartan 25 mg qhs. She did not tolerate low dose Entresto.  - Continue spironolactone 25 mg daily.  - Continue digoxin on MWF, dig level <0.2 03/2018 - Continue Coreg 12.5 mg bid.   - Reviewed recent BMET- stable.  2. PAD: s/p fem-fem bypass, followed by [facility].  No claudication, last ABIs somewhat worse in 4/19. She does not have clear claudication, pain in legs seems to be related to peripheral neuropathy and arthritis.  Continue to followup with [facility]. No change.  3. Carotid stenosis: Stable moderate stenosis on doppers. Follows with [facility]. No change.  4. Hyperlipidemia: Continue statin.  Recent lipids were acceptable (LDL 79 09/2017). No change.  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 She has peripheral neuropathy. - Genetic testing came back positive. Her family plans to get tested.  - Tafamidis paperwork in process. Confirmed with Nira Conn, RN. Waiting on approval.   Follow up in 2-3 months with Dr Aundra Dubin.    Darrick Grinder, NP

## 2018-05-01 ENCOUNTER — Other Ambulatory Visit (HOSPITAL_COMMUNITY): Payer: Self-pay

## 2018-05-01 NOTE — Telephone Encounter (Signed)
Spoke with pharmacy and they stated that pt will have to pay out of pocket for a refill. The cost is $10.73. Pt notified and agrees to the plan to pay out of pocket.

## 2018-05-01 NOTE — Telephone Encounter (Signed)
Pt called stating that she has misplaced her spironolactone that she just had filled. Pharmacy will not give her another refill so pt wants to know if she can take her daughters spironolactone 25mg  who is also a pt of yours until it is time for another refill. I informed pt not to take any until it is cleared through you. Please advise.

## 2018-05-01 NOTE — Telephone Encounter (Signed)
She can take her daughter's until we can work out refill.  Please see if this can be sorted out.

## 2018-05-07 ENCOUNTER — Telehealth: Payer: Self-pay

## 2018-05-07 NOTE — Telephone Encounter (Signed)
Spoke with pt regarding apt per WC d/t elevated RV lead threshold , pt stated that she just made an apt for 05/15/2018. Informed pt that this apt would be fine.

## 2018-05-09 LAB — CUP PACEART REMOTE DEVICE CHECK
Date Time Interrogation Session: 20190815110847
Implantable Lead Implant Date: 20140812
Implantable Lead Location: 753860
Implantable Lead Serial Number: 29451060
Implantable Pulse Generator Implant Date: 20140812
MDC IDC LEAD IMPLANT DT: 20140812
MDC IDC LEAD LOCATION: 753859
MDC IDC LEAD SERIAL: 10491693
MDC IDC PG SERIAL: 60721404
Pulse Gen Model: 383562

## 2018-05-15 ENCOUNTER — Other Ambulatory Visit (HOSPITAL_COMMUNITY): Payer: Self-pay | Admitting: Cardiology

## 2018-05-15 ENCOUNTER — Ambulatory Visit (INDEPENDENT_AMBULATORY_CARE_PROVIDER_SITE_OTHER): Payer: Medicare Other | Admitting: Cardiology

## 2018-05-15 ENCOUNTER — Encounter: Payer: Self-pay | Admitting: Cardiology

## 2018-05-15 ENCOUNTER — Ambulatory Visit (INDEPENDENT_AMBULATORY_CARE_PROVIDER_SITE_OTHER): Payer: Medicare Other | Admitting: *Deleted

## 2018-05-15 VITALS — BP 120/80 | HR 59 | Ht 66.0 in | Wt 187.4 lb

## 2018-05-15 DIAGNOSIS — I10 Essential (primary) hypertension: Secondary | ICD-10-CM

## 2018-05-15 DIAGNOSIS — I428 Other cardiomyopathies: Secondary | ICD-10-CM

## 2018-05-15 DIAGNOSIS — I5042 Chronic combined systolic (congestive) and diastolic (congestive) heart failure: Secondary | ICD-10-CM

## 2018-05-15 NOTE — Progress Notes (Signed)
Remote ICD transmission.   

## 2018-05-15 NOTE — Patient Instructions (Signed)
Medication Instructions:  Your physician recommends that you continue on your current medications as directed. Please refer to the Current Medication list given to you today.  * If you need a refill on your cardiac medications before your next appointment, please call your pharmacy.   Labwork: None ordered  Testing/Procedures: None ordered  Follow-Up: Your physician wants you to follow-up in: 6 months with Dr. Camnitz.  You will receive a reminder letter in the mail two months in advance. If you don't receive a letter, please call our office to schedule the follow-up appointment.  *Please note that any paperwork needing to be filled out by the provider will need to be addressed at the front desk prior to seeing the provider. Please note that any FMLA, disability or other documents regarding health condition is subject to a $25.00 charge that must be received prior to completion of paperwork in the form of a money order or check.  Thank you for choosing [facility]!!   Alana Dayton, RN [phone]        

## 2018-05-15 NOTE — Progress Notes (Signed)
Electrophysiology Office Note   Date:  05/15/2018   ID:  Lisa [name], DOB 1945-11-25, MRN [id number]  PCP:  [name], MD  Cardiologist:  Revankar Primary Electrophysiologist:  Constance Haw, MD    No chief complaint on file.    History of Present Illness: Lisa [name] is a 72 y.o. female who is being seen today for the evaluation of CHF, ICD at the request of [name], MD. Presenting today for electrophysiology evaluation. She has a history of congestive heart failure, hypertension, COPD, diabetes, hypertension, hyperlipidemia. She has a Biotronik ICD in place.  Today, denies symptoms of palpitations, chest pain, shortness of breath, orthopnea, PND, lower extremity edema, claudication, dizziness, presyncope, syncope, bleeding, or neurologic sequela. The patient is tolerating medications without difficulties.  Overall she is feeling well.  She has had some chest pain that is improved with Alka-Seltzer.  She feels that this is likely due to reflux.   Past Medical History:  Diagnosis Date  . AICD (automatic cardioverter/defibrillator) present 2014   Biotroniks  . Arthritis    knees with arthritis as well as has back, pt. remarks that her hands fall asleep when she is in the bed or in certain positions   . Bell's palsy   . Carotid artery disease ([place])    a. duplex - moderate bilateral carotid disease (60-79% 06/2017).  . Chronic systolic CHF (congestive heart failure) ([place])   . COPD (chronic obstructive pulmonary disease) ([place])   . Diabetes mellitus without complication (HCC)    borderline  . Former tobacco use   . GERD (gastroesophageal reflux disease)    uses otc for occas. heartburn   . Hyperlipidemia   . Hypertension   . Mild CAD    a. nonobstructive CAD (50% PLOM 08/2017).  . Neuropathy   . Non-ischemic cardiomyopathy ([place])   . PAD (peripheral artery disease) ([place])    a.  s/p L-R fem-fem BPG 2016.  [name] Pneumonia    hosp. in [place]-2015  .  Subclavian steal syndrome    a. h/o left subclavian steal.   Past Surgical History:  Procedure Laterality Date  . ABDOMINAL AORTAGRAM N/A 11/27/2014   Procedure: ABDOMINAL Maxcine Ham;  Surgeon: Elam Dutch, MD;  [facility] C[facility] LAB;  Service: Cardiovascular;  Laterality: N/A;  . ABDOMINAL HYSTERECTOMY    . APPENDECTOMY    . CARDIAC CATHETERIZATION     02/20/13: Normal coronaries, moderate LV dysfunction, EF 35%. Medical RX ([facility])  . CARDIAC DEFIBRILLATOR PLACEMENT  Aug. 2014   dual chamber Biotronik ICD 05/06/13 ([facility], Dr. Minna Merritts)  . ENDARTERECTOMY FEMORAL Right 12/21/2014   Procedure: ENDARTERECTOMY FEMORAL;  Surgeon: Elam Dutch, MD;  Location: [facility];  Service: Vascular;  Laterality: Right;  . FEMORAL-FEMORAL BYPASS GRAFT Bilateral 12/21/2014   Procedure: BYPASS GRAFT LEFT FEMORAL-RIGHT FEMORAL ARTERY;  Surgeon: Elam Dutch, MD;  Location: [facility];  Service: Vascular;  Laterality: Bilateral;  . JOINT REPLACEMENT Bilateral    implants  . RIGHT/LEFT HEART CATH AND CORONARY ANGIOGRAPHY N/A 08/28/2017   Procedure: RIGHT/LEFT HEART CATH AND CORONARY ANGIOGRAPHY;  Surgeon: Larey Dresser, MD;  Location: [facility] CV LAB;  Service: Cardiovascular;  Laterality: N/A;  . TUBAL LIGATION       Current Outpatient Medications  Medication Sig Dispense Refill  . acetaminophen (TYLENOL) 650 MG CR tablet Take 650 mg by mouth every 8 (eight) hours as needed for pain.    [name] albuterol (PROVENTIL HFA;VENTOLIN HFA) 108 (90 Base) MCG/ACT inhaler Inhale  2 puffs into the lungs every 6 (six) hours as needed for wheezing or shortness of breath. 1 Inhaler 6  . albuterol (PROVENTIL) (2.5 MG/3ML) 0.083% nebulizer solution Take 3 mLs by nebulization every 4 (four) hours as needed for wheezing.    [name] aspirin EC 81 MG tablet Take 162 mg by mouth daily.    [name] atorvastatin (LIPITOR) 20 MG tablet TAKE 1 TABLET BY MOUTH DAILY 30 tablet 4  . carvedilol (COREG) 12.5 MG tablet TAKE ONE (1) TABLET BY MOUTH TWO (2)  TIMES DAILY 180 tablet 1  . DIGOX 125 MCG tablet TAKE 1/2 TABLET BY MOUTH ONCE DAILY ON MONDAYS, WEDNESDAYS, AND FRIDAYS 27 tablet 6  . fluticasone furoate-vilanterol (BREO ELLIPTA) 200-25 MCG/INH AEPB Inhale 1 puff into the lungs daily.    [name] glimepiride (AMARYL) 2 MG tablet Take 2 mg by mouth daily.    [name] losartan (COZAAR) 25 MG tablet Take 1 tablet (25 mg total) by mouth at bedtime. 30 tablet 3  . OXcarbazepine (TRILEPTAL) 150 MG tablet Take 150 mg by mouth 2 (two) times daily.    . potassium chloride SA (K-DUR,KLOR-CON) 20 MEQ tablet Take 1 tablet (20 mEq total) by mouth daily. 30 tablet 3  . pregabalin (LYRICA) 150 MG capsule Take 150 mg by mouth 2 (two) times daily.    [name] spironolactone (ALDACTONE) 25 MG tablet Take 1 tablet (25 mg total) by mouth daily. 30 tablet 3  . torsemide (DEMADEX) 10 MG tablet Take 8 tablets (80 mg total) by mouth daily. 120 tablet 3   No current facility-administered medications for this visit.     Allergies:   Oxycodone and Entresto [sacubitril-valsartan]   Social History:  The patient  reports that she quit smoking about 7 years ago. Her smoking use included cigarettes. She has never used smokeless tobacco. She reports that she does not drink alcohol or use drugs.   Family History:  The patient's family history includes Heart disease in her mother; Hypertension in her mother.    ROS:  Please see the history of present illness.   Otherwise, review of systems is positive for none.   All other systems are reviewed and negative.   PHYSICAL EXAM: VS:  There were no vitals taken for this visit. , BMI There is no height or weight on file to calculate BMI. GEN: Well nourished, well developed, in no acute distress  HEENT: normal  Neck: no JVD, carotid bruits, or masses Cardiac: RRR; no murmurs, rubs, or gallops,no edema  Respiratory:  clear to auscultation bilaterally, normal work of breathing GI: soft, nontender, nondistended, + BS MS: no deformity or atrophy    Skin: warm and dry, device site well healed Neuro:  Strength and sensation are intact Psych: euthymic mood, full affect  EKG:  EKG is not ordered today. Personal review of the ekg ordered 04/02/18 shows rhythm, first-degree AV block, PVCs  Personal review of the device interrogation today. Results in [place]: 08/24/2017: TSH 3.723 03/18/2018: B Natriuretic Peptide 415.9 03/19/2018: ALT 17; Magnesium 2.1 03/20/2018: Hemoglobin 12.3; Platelets 247 04/09/2018: BUN 29; Creatinine, Ser 1.14; Potassium 4.4; Sodium 140    Lipid Panel     Component Value Date/Time   CHOL 138 09/26/2017 1033   TRIG 107 09/26/2017 1033   HDL 38 (L) 09/26/2017 1033   CHOLHDL 3.6 09/26/2017 1033   VLDL 21 09/26/2017 1033   LDLCALC 79 09/26/2017 1033     Wt Readings from Last 3 Encounters:  04/23/18  185 lb (83.9 kg)  04/09/18 184 lb 7 oz (83.7 kg)  04/02/18 189 lb 12.8 oz (86.1 kg)      Other studies Reviewed: Additional studies/ records that were reviewed today include: TTE 04/02/17  Review of the above records today demonstrates:  Severely reduced LV systolic function. Ejection fraction is visually estimated at 25% Global LV hypokinesis. Abnormal LV diastolic function with evidence of elevated LA pressure. Mildly dilated RV chamber. RV systolic function is probably normal. Biatrial dilation, mild. Moderate mitral regurgitation, probably functional. Mild tricuspid regurgitation.   ASSESSMENT AND PLAN:  1.  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  Device functioning appropriately.  She has had increases in her RV lead impedance.  They have been a steady increase without spikes in impedances.  Pacing and shock impedance functioning appropriately.  We Mayo Faulk continue to monitor.  2. Hypertension: Currently well controlled  3. Chest pain: Symptoms most consistent with reflux.  Improved with  Alka-Seltzer    Current medicines are reviewed at length with the patient today.   The patient does not have concerns regarding her medicines.  The following changes were made today: None  Labs/ tests ordered today include:  No orders of the defined types were placed in this encounter.    Disposition:   FU with Dagoberto Nealy 6 months  Signed, Chrissa Meetze Meredith Leeds, MD  05/15/2018 11:35 AM     [facility] [address] [place] [address] [phone] (office) [phone] (fax)

## 2018-05-17 ENCOUNTER — Encounter: Payer: Self-pay | Admitting: Cardiology

## 2018-05-30 ENCOUNTER — Other Ambulatory Visit (HOSPITAL_COMMUNITY): Payer: Self-pay | Admitting: *Deleted

## 2018-05-30 MED ORDER — SPIRONOLACTONE 25 MG PO TABS: 25.0000 mg | ORAL_TABLET | Freq: Every day | ORAL | 3 refills | Status: DC

## 2018-06-03 ENCOUNTER — Other Ambulatory Visit (HOSPITAL_COMMUNITY): Payer: Self-pay | Admitting: *Deleted

## 2018-06-03 MED ORDER — SPIRONOLACTONE 25 MG PO TABS: 25.0000 mg | ORAL_TABLET | Freq: Every day | ORAL | 3 refills | Status: DC

## 2018-06-21 LAB — CUP PACEART REMOTE DEVICE CHECK
Brady Statistic AP VP Percent: 13 %
Brady Statistic AP VS Percent: 33 %
Brady Statistic AS VP Percent: 0 %
Brady Statistic AS VS Percent: 50 %
HighPow Impedance: 82 Ohm
Implantable Lead Implant Date: 20140812
Implantable Lead Location: 753859
Implantable Lead Serial Number: 29451060
Lead Channel Impedance Value: 1515 Ohm
Lead Channel Pacing Threshold Amplitude: 0.3 V
Lead Channel Pacing Threshold Amplitude: 0.5 V
Lead Channel Pacing Threshold Pulse Width: 0.4 ms
Lead Channel Setting Pacing Amplitude: 2.5 V
Lead Channel Setting Pacing Pulse Width: 0.4 ms
Lead Channel Setting Sensing Sensitivity: 0.8 mV
MDC IDC LEAD IMPLANT DT: 20140812
MDC IDC LEAD LOCATION: 753860
MDC IDC LEAD SERIAL: 10491693
MDC IDC MSMT BATTERY REMAINING PERCENTAGE: 55 %
MDC IDC MSMT BATTERY VOLTAGE: 3.11 V
MDC IDC MSMT LEADCHNL RA IMPEDANCE VALUE: 498 Ohm
MDC IDC MSMT LEADCHNL RA PACING THRESHOLD PULSEWIDTH: 0.4 ms
MDC IDC PG IMPLANT DT: 20140812
MDC IDC PG SERIAL: 60721404
MDC IDC SESS DTM: 20190927052200
MDC IDC SET LEADCHNL RA PACING AMPLITUDE: 2.5 V
MDC IDC STAT BRADY RA PERCENT PACED: 46 %
MDC IDC STAT BRADY RV PERCENT PACED: 13 %

## 2018-06-25 ENCOUNTER — Telehealth (HOSPITAL_COMMUNITY): Payer: Self-pay

## 2018-06-25 NOTE — Telephone Encounter (Signed)
That would be fine 

## 2018-06-25 NOTE — Telephone Encounter (Signed)
Pt called and stated she has an appt 10/02 at 9:40am. Pt would like a Lasix IV due to edema. Pt also states that this has been done before.

## 2018-06-27 ENCOUNTER — Ambulatory Visit (HOSPITAL_COMMUNITY)
Admission: RE | Admit: 2018-06-27 | Discharge: 2018-06-27 | Disposition: A | Discharge status: Home / Self Care | Payer: Medicare Other | Source: Ambulatory Visit | Attending: Cardiology | Admitting: Cardiology

## 2018-06-27 VITALS — BP 110/78 | HR 76 | Wt 193.0 lb

## 2018-06-27 DIAGNOSIS — I5022 Chronic systolic (congestive) heart failure: Secondary | ICD-10-CM | POA: Insufficient documentation

## 2018-06-27 DIAGNOSIS — Z7951 Long term (current) use of inhaled steroids: Secondary | ICD-10-CM | POA: Insufficient documentation

## 2018-06-27 DIAGNOSIS — Z8249 Family history of ischemic heart disease and other diseases of the circulatory system: Secondary | ICD-10-CM | POA: Diagnosis not present

## 2018-06-27 DIAGNOSIS — I6523 Occlusion and stenosis of bilateral carotid arteries: Secondary | ICD-10-CM | POA: Insufficient documentation

## 2018-06-27 DIAGNOSIS — E785 Hyperlipidemia, unspecified: Secondary | ICD-10-CM | POA: Insufficient documentation

## 2018-06-27 DIAGNOSIS — J449 Chronic obstructive pulmonary disease, unspecified: Secondary | ICD-10-CM | POA: Insufficient documentation

## 2018-06-27 DIAGNOSIS — I2510 Atherosclerotic heart disease of native coronary artery without angina pectoris: Secondary | ICD-10-CM | POA: Insufficient documentation

## 2018-06-27 DIAGNOSIS — I5042 Chronic combined systolic (congestive) and diastolic (congestive) heart failure: Secondary | ICD-10-CM

## 2018-06-27 DIAGNOSIS — E859 Amyloidosis, unspecified: Secondary | ICD-10-CM

## 2018-06-27 DIAGNOSIS — Z7982 Long term (current) use of aspirin: Secondary | ICD-10-CM | POA: Diagnosis not present

## 2018-06-27 DIAGNOSIS — Z7984 Long term (current) use of oral hypoglycemic drugs: Secondary | ICD-10-CM | POA: Diagnosis not present

## 2018-06-27 DIAGNOSIS — M109 Gout, unspecified: Secondary | ICD-10-CM | POA: Insufficient documentation

## 2018-06-27 DIAGNOSIS — E1151 Type 2 diabetes mellitus with diabetic peripheral angiopathy without gangrene: Secondary | ICD-10-CM | POA: Diagnosis not present

## 2018-06-27 DIAGNOSIS — I779 Disorder of arteries and arterioles, unspecified: Secondary | ICD-10-CM

## 2018-06-27 DIAGNOSIS — Z95810 Presence of automatic (implantable) cardiac defibrillator: Secondary | ICD-10-CM | POA: Diagnosis not present

## 2018-06-27 DIAGNOSIS — Z95828 Presence of other vascular implants and grafts: Secondary | ICD-10-CM | POA: Diagnosis not present

## 2018-06-27 DIAGNOSIS — Z87891 Personal history of nicotine dependence: Secondary | ICD-10-CM | POA: Insufficient documentation

## 2018-06-27 DIAGNOSIS — Z96653 Presence of artificial knee joint, bilateral: Secondary | ICD-10-CM | POA: Diagnosis not present

## 2018-06-27 DIAGNOSIS — Z79899 Other long term (current) drug therapy: Secondary | ICD-10-CM | POA: Diagnosis not present

## 2018-06-27 DIAGNOSIS — E1142 Type 2 diabetes mellitus with diabetic polyneuropathy: Secondary | ICD-10-CM | POA: Insufficient documentation

## 2018-06-27 DIAGNOSIS — I428 Other cardiomyopathies: Secondary | ICD-10-CM | POA: Diagnosis not present

## 2018-06-27 LAB — BASIC METABOLIC PANEL
Anion gap: 11 (ref 5–15)
BUN: 27 mg/dL — ABNORMAL HIGH (ref 8–23)
CHLORIDE: 100 mmol/L (ref 98–111)
CO2: 28 mmol/L (ref 22–32)
Calcium: 9.3 mg/dL (ref 8.9–10.3)
Creatinine, Ser: 1.11 mg/dL — ABNORMAL HIGH (ref 0.44–1.00)
GFR calc non Af Amer: 49 mL/min — ABNORMAL LOW (ref >60)
GFR, EST AFRICAN AMERICAN: 57 mL/min — AB (ref >60)
Glucose, Bld: 315 mg/dL — ABNORMAL HIGH (ref 70–99)
POTASSIUM: 4.8 mmol/L (ref 3.5–5.1)
Sodium: 139 mmol/L (ref 135–145)

## 2018-06-27 LAB — DIGOXIN LEVEL: Digoxin Level: <0.2 ng/mL — ABNORMAL LOW (ref 0.8–2.0)

## 2018-06-27 MED ORDER — TORSEMIDE 20 MG PO TABS: ORAL_TABLET | ORAL | 1 refills | Status: DC

## 2018-06-27 MED ORDER — LOSARTAN POTASSIUM 25 MG PO TABS: 25.0000 mg | ORAL_TABLET | Freq: Two times a day (BID) | ORAL | 3 refills | Status: DC

## 2018-06-27 NOTE — Progress Notes (Signed)
PCP: Dr. Loistine Simas Cardiology: Dr. Geraldo Pitter HF Cardiology: Dr. Aundra Dubin  72 y.o. with history of PAD, COPD, carotid stenosis, and chronic systolic CHF was referred by Dr. Geraldo Pitter for evaluation/treatment of CHF.    Patient has a long history of vascular disease.  She had left-right fem-fem bypass in 3/16. She has moderate bilateral carotid stenosis.  Echo in 2/18 showed EF 20-25%.  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  She was admitted in 9/18 to the hospital in [facility] for about 4 days with acute on chronic systolic CHF.    RHC/LHC in 12/18 showed nonobstructive coronary disease, low filling pressures, and CI 2.0.   Admitted 09/20/17 with volume overload. Diuresed with IV lasix. Weight went down 2 pounds. Discharged on torsemide 40 mg daily. Discharge weight 179 pounds.   Echo in 12/18 with EF 20-25%, mild basal ventricular hypertrophy, severe mid to apical LV hypertrophy.  Repeat echo in 2/19 was done with Definity contrast, showing that what was thought to be apical hypertrophy was actually prominent mid-apical trabeculation suggestive of noncompaction.   She was noted to have Bence-Jones protein on UPEP.  However, SPEP was negative and 24 hours UPEP was also negative. Abdominal fat pad biopsy did not show evidence of amyloidosis.  She saw heme-onc, probably does not have AL amyloidosis.  However, in 2/19, she had TcPYP scan that was strongly suggestive of transthyretin amyloidosis. She has peripheral neuropathy that may be due to amyloidosis as well. 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   Admitted 6/24-6/26/19 for LLL PNA. She received IV abx and prednisone. Transitioned to omnicef 300 mg BID and azirthromycin 250 mg daily. BP medications were held, but restarted at discharge.   She returns today for HF follow up. She has been eating a high sodium diet recently.  She continues to have leg pain from neuropathy.  No falls.  No dyspnea walking on  flat ground.  She goes to a gym several days a week and rides exercise bike and uses free weights.  No orthopnea/PND.  No chest pain.  Weight is up about 8 lbs.    Labs (8/18): LDL 80, HDL 46 Labs (10/18): K 4.2, creatinine 1.1 Labs (11/18): digoxin 0.3 Labs (12/18): K 3.5, creatinine 1.02, UPEP with faint Bence-Jones protein but 24 hour UPEP negative and SPEP negative.  Labs (1/19): LDL 79, HDL 38, BNP 493, hgb 13.6, K 5.8 => 4.8, creatinine 1.18, digoxin 0.3 Labs (2/19): K 3.3, creatinine 1.06 Labs (3/19): digoxin < 0.2 Labs (4/19): K 5, creatinine 0.97 Labs (6/19): K 3.5, creatinine 1.19 Labs (7/19): digoxin < 0.2, K 4.4, creatinine 1.14  PMH: 1. PAD: L=>R fem-fem bypass in 3/16.   - ABIs (3/18): Mildly abnormal, 0.81 left and 0.85 right.  - ABIs (4/19): 0.68 left and 0.66 right 2. H/o appendectomy 3. COPD: Quit smoking in 2/12.  - High resolution CT chest (2/19): No interstitial lung disease, +emphysema.  4. Arthritis: Bilateral TKR 5. Diet-controlled diabetes 6. Hyperlipidemia 7. Carotid stenosis:  - 10/18 Carotid dopplers: 60-79% RICA stenosis, 76-28% LICA stenosis, left subclavian steal.  - 4/19 Carotid dopplers: 31-51% RICA, 76-16% LICA, left subclavian stenosis.  8. Chronic systolic CHF: Echo (0/73) with EF 20-25%, mild LV dilation, normal RV size and systolic function. 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 .  - LHC (10/17): Nonobstructive CAD.  - Biotronik ICD.  - LHC/RHC (12/18): 50% proximal PLOM (no obstructive disease). Mean RA 1, PA 32/10 mean 19, mean PCWP  7, CI 2.0.  - Echo (12/18): EF 20-25%, mild basal LV hypertrophy, severe mid to apical LV hypertrophy, moderate diastolic dysfunction, mild MR, PASP 42 mmHg.  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  - Workup appears negative for AL amyloidosis.   Negative abdominal fat pad biopsy.  However, PYP scan was suggestive of transthyretin amyloidosis.  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  10. Peripheral neuropathy: ?due to amyloidosis.  11. Gout 12. H/o Bell's palsy  SH: Lives with husband in [place], 3 daughters.  Quit smoking in 2012.    Family History  Problem Relation Age of Onset  . Heart disease Mother   . Hypertension Mother   Daughter with cardiomyopathy. Nephews with CAD.   Review of systems complete and found to be negative unless listed in HPI.   Current Outpatient Medications  Medication Sig Dispense Refill  . acetaminophen (TYLENOL) 650 MG CR tablet Take 650 mg by mouth every 8 (eight) hours as needed for pain.    [name] albuterol (PROVENTIL HFA;VENTOLIN HFA) 108 (90 Base) MCG/ACT inhaler Inhale 2 puffs into the lungs every 6 (six) hours as needed for wheezing or shortness of breath. 1 Inhaler 6  . albuterol (PROVENTIL) (2.5 MG/3ML) 0.083% nebulizer solution Take 3 mLs by nebulization every 4 (four) hours as needed for wheezing.    [name] aspirin EC 81 MG tablet Take 162 mg by mouth daily.    [name] atorvastatin (LIPITOR) 20 MG tablet TAKE 1 TABLET BY MOUTH DAILY 30 tablet 4  . carvedilol (COREG) 12.5 MG tablet TAKE ONE (1) TABLET BY MOUTH TWO (2) TIMES DAILY 180 tablet 1  . DIGOX 125 MCG tablet TAKE 1/2 TABLET BY MOUTH ONCE DAILY ON MONDAYS, WEDNESDAYS, AND FRIDAYS 27 tablet 6  . fluticasone furoate-vilanterol (BREO ELLIPTA) 200-25 MCG/INH AEPB Inhale 1 puff into the lungs daily.    [name] glimepiride (AMARYL) 2 MG tablet Take 2 mg by mouth daily.    [name] losartan (COZAAR) 25 MG tablet Take 1 tablet (25 mg total) by mouth 2 (two) times daily. 60 tablet 3  . OXcarbazepine (TRILEPTAL) 150 MG tablet Take 150 mg by mouth 2 (two) times daily.    . potassium chloride SA (K-DUR,KLOR-CON) 20 MEQ tablet Take 1 tablet (20 mEq total) by  mouth daily. 30 tablet 3  . pregabalin (LYRICA) 150 MG capsule Take 150 mg by mouth 2 (two) times daily.    [name] spironolactone (ALDACTONE) 25 MG tablet Take 1 tablet (25 mg total) by mouth daily. 30 tablet 3  . torsemide (DEMADEX) 10 MG tablet Take 8 tablets (80 mg total) by mouth daily. 120 tablet 3  . torsemide (DEMADEX) 20 MG tablet Take 4 tablets (80 mg total) every morning and 2 tablets (40 mg total) every other afternoon 180 tablet 1   No current facility-administered medications for this encounter.    BP 110/78   Pulse 76   Wt 87.5 kg (193 lb)   SpO2 94%   BMI 31.15 kg/m   Wt Readings from Last 3 Encounters:  06/27/18 87.5 kg (193 lb)  05/15/18 85 kg (187 lb 6.4 oz)  04/23/18 83.9 kg (185 lb)   General: NAD Neck: JVP 8 cm, no thyromegaly or thyroid nodule.  Lungs: Clear to auscultation bilaterally with normal respiratory effort. CV: Nondisplaced PMI.  Heart  regular S1/S2, no S3/S4, no murmur. 1+ edema 1/2 to knees bilaterally.  No carotid bruit.  Normal pedal pulses.  Abdomen: Soft, nontender, no hepatosplenomegaly, no distention.  Skin: Intact without lesions or rashes.  Neurologic: Alert and oriented x 3.  Psych: Normal affect. Extremities: No clubbing or cyanosis.  HEENT: Normal.   EKG (personally reviewed): NSR with 1st degree AV block, QRS 124 msec   Assessment/Plan: 1. Chronic systolic CHF: Nonischemic cardiomyopathy.  EF 20-25% on echo in 2/18.  LHC/RHC in 12/18 with low filling pressures, CI 2.0, and nonobstructive CAD. She has a Biotronik ICD.  Also of note, daughter and nephew have sarcoidosis => patient had a chest CT in 2/19 without evidence for pulmonary sarcoidosis.  We are unable to do an MRI to look for infiltrative disease due to ICD.  Echo in 12/18 showed EF 20-25%, noted to have LVH pattern that is mild in the basal segments and moderate to severe in the apical segments. This was concerning for apical HCM and study was repeated in 2/19 with Definity contrast.   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 We also did a workup for amyloidosis. She did not have evidence for AL amyloidosis, but TcPYP scan in 2/19 was strongly suggestive of transthyretin amyloidosis.  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 Genetic testing was negative, suggesting wild-type TTR amyloidosis. NYHA class II symptoms but mildly volume overloaded on exam. Weight is up.  - Increase torsemide to 80 qam/40 qpm x 3 days, then 80 qam and 40 every other afternoon after that. - Continue KCl 20 meq daily.  - She did not tolerate low dose Entresto. Increase losartan to 25 mg bid. BMET today and again in 10 days.  - Continue spironolactone to 25 mg daily.  - Continue digoxin, check digoxin level today - Continue Coreg 12.5 mg bid.   - Narrow QRS, not CRT candidate.  - Cut back on sodium in diet.  2. PAD: s/p fem-fem bypass, followed by [facility].  No claudication, last ABIs somewhat worse in 4/19. She does not have clear claudication, pain in legs seems to be related to peripheral neuropathy and arthritis.  Continue to followup with [facility], sees Dr Oneida Alar soon. No change.  3. Carotid stenosis: Stable moderate stenosis on doppers. Follows with [facility]. No change 4. Hyperlipidemia: Continue atorvastatin. Recent lipids were acceptable (LDL 79 09/2017). No change 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 She has peripheral neuropathy.  Genetic testing negative, wild-type.  - Working on paperwork for tafamidis.   Followup with APP in 1 month to reassess volume.   Loralie Champagne, MD 06/27/2018

## 2018-06-27 NOTE — Progress Notes (Signed)
Pt signed paperwork for Vyndaqel, PA was submitted and approved through 09/25/19.  Faxed referral form and insurance info into [facility].  Pt is aware to expect call

## 2018-06-27 NOTE — Patient Instructions (Addendum)
Medication Instructions:  Your physician has recommended you make the following change in your medication:   1. INCREASE: Losartan to 25 mg twice a day  2. INCREASE: torsemide 20 mg tablet-   For 3 days: Take 4 tablets (80 mg total) in the morning and 2 tablets (40 mg total) in the afternoon   Then: Take 4 tablets (80 mg total) every morning and 2 tablets (40 mg total) every other afternoon   Labwork: TODAY: BMET, Digoxin  Your physician recommends that you return for lab work in: 10 days for BMET   Testing/Procedures: None ordered  Follow-Up: Your physician recommends that you schedule a follow-up appointment in: 1 month with APP   Any Other Special Instructions Will Be Listed Below (If Applicable).     If you need a refill on your cardiac medications before your next appointment, please call your pharmacy.

## 2018-07-01 ENCOUNTER — Telehealth (HOSPITAL_COMMUNITY): Payer: Self-pay | Admitting: *Deleted

## 2018-07-01 MED ORDER — DIGOXIN 125 MCG PO TABS: 0.0625 mg | ORAL_TABLET | Freq: Every day | ORAL | 6 refills | Status: DC

## 2018-07-01 NOTE — Telephone Encounter (Signed)
Called about her Vyndaqel medication, the med has been approved by her insurance through 09/25/19.  Prescription has been sent to [facility].  I contacted them gave all her info and verbal order for med.  They state they will contact pt about co-pay and shipping address.  Patient is aware to expect their call and to contact [employer] if copay is to expensive.  Also discussed labs from last OV, per Dr Aundra Dubin:  Notes recorded by Larey Dresser, MD on 06/27/2018 at 9:10 PM EDT Creatinine ok. I think she can increase digoxin to 0.0625 daily. Repeat digoxin level in 10 days.  Notes recorded by Scarlette Calico, RN on 07/01/2018 at 11:59 AM EDT Pt aware, agreeable and verbalized understanding, she request to have labs done at [facility] in [place], orders mailed to her and she will take them next week for labs

## 2018-07-05 ENCOUNTER — Encounter: Payer: Self-pay | Admitting: Family

## 2018-07-05 ENCOUNTER — Ambulatory Visit (HOSPITAL_COMMUNITY)
Admission: RE | Admit: 2018-07-05 | Discharge: 2018-07-05 | Disposition: A | Discharge status: Home / Self Care | Payer: Medicare Other | Source: Ambulatory Visit | Attending: Family | Admitting: Family

## 2018-07-05 ENCOUNTER — Ambulatory Visit (INDEPENDENT_AMBULATORY_CARE_PROVIDER_SITE_OTHER): Payer: Medicare Other | Admitting: Family

## 2018-07-05 ENCOUNTER — Ambulatory Visit (INDEPENDENT_AMBULATORY_CARE_PROVIDER_SITE_OTHER)
Admission: RE | Admit: 2018-07-05 | Discharge: 2018-07-05 | Disposition: A | Discharge status: Home / Self Care | Payer: Medicare Other | Source: Ambulatory Visit | Attending: Family | Admitting: Family

## 2018-07-05 VITALS — BP 111/65 | HR 60 | Temp 97.3°F | Resp 18 | Ht 66.0 in | Wt 190.0 lb

## 2018-07-05 DIAGNOSIS — I6523 Occlusion and stenosis of bilateral carotid arteries: Secondary | ICD-10-CM | POA: Diagnosis present

## 2018-07-05 DIAGNOSIS — I779 Disorder of arteries and arterioles, unspecified: Secondary | ICD-10-CM

## 2018-07-05 DIAGNOSIS — I771 Stricture of artery: Secondary | ICD-10-CM

## 2018-07-05 DIAGNOSIS — Z87891 Personal history of nicotine dependence: Secondary | ICD-10-CM | POA: Diagnosis not present

## 2018-07-05 NOTE — Patient Instructions (Signed)
Stroke Prevention Some health problems and behaviors may make it more likely for you to have a stroke. Below are ways to lessen your risk of having a stroke.  Be active for at least 30 minutes on most or all days.  Do not smoke. Try not to be around others who smoke.  Do not drink too much alcohol. ? Do not have more than 2 drinks a day if you are a man. ? Do not have more than 1 drink a day if you are a woman and are not pregnant.  Eat healthy foods, such as fruits and vegetables. If you were put on a specific diet, follow the diet as told.  Keep your cholesterol levels under control through diet and medicines. Look for foods that are low in saturated fat, trans fat, cholesterol, and are high in fiber.  If you have diabetes, follow all diet plans and take your medicine as told.  Ask your doctor if you need treatment to lower your blood pressure. If you have high blood pressure (hypertension), follow all diet plans and take your medicine as told by your doctor.  If you are 18-39 years old, have your blood pressure checked every 3-5 years. If you are age 40 or older, have your blood pressure checked every year.  Keep a healthy weight. Eat foods that are low in calories, salt, saturated fat, trans fat, and cholesterol.  Do not take drugs.  Avoid birth control pills, if this applies. Talk to your doctor about the risks of taking birth control pills.  Talk to your doctor if you have sleep problems (sleep apnea).  Take all medicine as told by your doctor. ? You may be told to take aspirin or blood thinner medicine. Take this medicine as told by your doctor. ? Understand your medicine instructions.  Make sure any other conditions you have are being taken care of.  Get help right away if:  You suddenly lose feeling (you feel numb) or have weakness in your face, arm, or leg.  Your face or eyelid hangs down to one side.  You suddenly feel confused.  You have trouble talking  (aphasia) or understanding what people are saying.  You suddenly have trouble seeing in one or both eyes.  You suddenly have trouble walking.  You are dizzy.  You lose your balance or your movements are clumsy (uncoordinated).  You suddenly have a very bad headache and you do not know the cause.  You have new chest pain.  Your heart feels like it is fluttering or skipping a beat (irregular heartbeat). Do not wait to see if the symptoms above go away. Get help right away. Call your local emergency services (911 in [place] Do not drive yourself to the hospital. This information is not intended to replace advice given to you by your health care provider. Make sure you discuss any questions you have with your health care provider. Document Released: 03/12/2012 Document Revised: 02/17/2016 Document Reviewed: 03/14/2013 Elsevier Interactive Patient Education  2018 Elsevier Inc.     Peripheral Vascular Disease Peripheral vascular disease (PVD) is a disease of the blood vessels that are not part of your heart and brain. A simple term for PVD is poor circulation. In most cases, PVD narrows the blood vessels that carry blood from your heart to the rest of your body. This can result in a decreased supply of blood to your arms, legs, and internal organs, like your stomach or kidneys. However, it most often affects   a person's lower legs and feet. There are two types of PVD.  Organic PVD. This is the more common type. It is caused by damage to the structure of blood vessels.  Functional PVD. This is caused by conditions that make blood vessels contract and tighten (spasm).  Without treatment, PVD tends to get worse over time. PVD can also lead to acute ischemic limb. This is when an arm or limb suddenly has trouble getting enough blood. This is a medical emergency. Follow these instructions at home:  Take medicines only as told by your doctor.  Do not use any tobacco products, including  cigarettes, chewing tobacco, or electronic cigarettes. If you need help quitting, ask your doctor.  Lose weight if you are overweight, and maintain a healthy weight as told by your doctor.  Eat a diet that is low in fat and cholesterol. If you need help, ask your doctor.  Exercise regularly. Ask your doctor for some good activities for you.  Take good care of your feet. ? Wear comfortable shoes that fit well. ? Check your feet often for any cuts or sores. Contact a doctor if:  You have cramps in your legs while walking.  You have leg pain when you are at rest.  You have coldness in a leg or foot.  Your skin changes.  You are unable to get or have an erection (erectile dysfunction).  You have cuts or sores on your feet that are not healing. Get help right away if:  Your arm or leg turns cold and blue.  Your arms or legs become red, warm, swollen, painful, or numb.  You have chest pain or trouble breathing.  You suddenly have weakness in your face, arm, or leg.  You become very confused or you cannot speak.  You suddenly have a very bad headache.  You suddenly cannot see. This information is not intended to replace advice given to you by your health care provider. Make sure you discuss any questions you have with your health care provider. Document Released: 12/06/2009 Document Revised: 02/17/2016 Document Reviewed: 02/19/2014 Elsevier Interactive Patient Education  2017 Elsevier Inc.  

## 2018-07-05 NOTE — Progress Notes (Signed)
[facility] [place]   CC: Follow up peripheral artery occlusive disease and extracranial carotid artery stenosis   History of Present Illness Lisa [name] is a 72 y.o. female who is s/p left to right femoral-femoral bypass, right femoral endarterectomy on 3/28/16by Dr. Oneida Alar. She also has aknown history of bilateral carotid stenosis.   She returns today forroutine follow up.  She has a history of bilateral total knee replacements. Dr. Aundra Dubin is her cardiologist for CHF. Her PCP is Dr. [name].   She saw Dr. Aundra Dubin on 06-27-18 who is evaluating pt forTransthyretin amyloidosis: he strongly suspects this from TcPYP scan in 2/19. She has peripheral neuropathy.  Genetic testing negative, wild-type. Pt states Dr. Aundra Dubin. told her this also affects her kidneys and heart.  His office is working on paperwork for tafamidis.   She rides a stationary bike 5 days/week for 30 minutes, then lifts weights, since 2013.   Pt she states she has painful neuropathy in her feet and legs, and has arthritis pain, takes Lyrica. Otherwise she denies any worse pain in her legs with walking.  She states she cannot walk much due to shortness of breath.  Pt states she knows about thyroid nodules, states needle bx done years ago.   The patient denies any history of TIA or stroke symptoms, specifically she denies a history of amaurosis fugax or monocular blindness, unilateral facial drooping, hemiplegia, or receptive or expressive aphasia.  GFR 57, serum creatinine 1.11 on 06-27-18.   She c/o occasional pressure feeling in lower abdomen for about a month, no aggravating or alleviating factors, denies needing to urinate any more often than usual (is taking a diuretic), denies dysuria. I advised her to see her PCP ASAP re this to check her urine for  A UTI.   Diabetic:Yes, last A1C result on file was 7.0 on 03-19-18 Tobacco XIH:[id number] smoker, quitin 2012, started about age  15years  Pt meds include: Statin: yes Betablocker:Yes ASA:Yes Other anticoagulants/antiplatelets:no   Past Medical History:  Diagnosis Date  . AICD (automatic cardioverter/defibrillator) present 2014   Biotroniks  . Arthritis    knees with arthritis as well as has back, pt. remarks that her hands fall asleep when she is in the bed or in certain positions   . Bell's palsy   . Carotid artery disease ([place])    a. duplex - moderate bilateral carotid disease (60-79% 06/2017).  . Chronic systolic CHF (congestive heart failure) ([place])   . COPD (chronic obstructive pulmonary disease) ([place])   . Diabetes mellitus without complication (HCC)    borderline  . Former tobacco use   . GERD (gastroesophageal reflux disease)    uses otc for occas. heartburn   . Hyperlipidemia   . Hypertension   . Mild CAD    a. nonobstructive CAD (50% PLOM 08/2017).  . Neuropathy   . Non-ischemic cardiomyopathy ([place])   . PAD (peripheral artery disease) ([place])    a.  s/p L-R fem-fem BPG 2016.  [name] Pneumonia    hosp. in [place]-2015  . Subclavian steal syndrome    a. h/o left subclavian steal.    Social History Social History   Tobacco Use  . Smoking status: Former Smoker    Types: Cigarettes    Last attempt to quit: 11/14/2010    Years since quitting: 7.6  . Smokeless tobacco: Never Used  Substance Use Topics  . Alcohol use: No    Alcohol/week: 0.0 standard drinks  . Drug use: No  Family History Family History  Problem Relation Age of Onset  . Heart disease Mother   . Hypertension Mother     Past Surgical History:  Procedure Laterality Date  . ABDOMINAL AORTAGRAM N/A 11/27/2014   Procedure: ABDOMINAL Maxcine Ham;  Surgeon: Elam Dutch, MD;  [facility] C[facility] LAB;  Service: Cardiovascular;  Laterality: N/A;  . ABDOMINAL HYSTERECTOMY    . APPENDECTOMY    . CARDIAC CATHETERIZATION     02/20/13: Normal coronaries, moderate LV dysfunction, EF 35%. Medical RX ([facility])  . CARDIAC  DEFIBRILLATOR PLACEMENT  Aug. 2014   dual chamber Biotronik ICD 05/06/13 ([facility], Dr. Minna Merritts)  . ENDARTERECTOMY FEMORAL Right 12/21/2014   Procedure: ENDARTERECTOMY FEMORAL;  Surgeon: Elam Dutch, MD;  Location: [facility];  Service: Vascular;  Laterality: Right;  . FEMORAL-FEMORAL BYPASS GRAFT Bilateral 12/21/2014   Procedure: BYPASS GRAFT LEFT FEMORAL-RIGHT FEMORAL ARTERY;  Surgeon: Elam Dutch, MD;  Location: [facility];  Service: Vascular;  Laterality: Bilateral;  . JOINT REPLACEMENT Bilateral    implants  . RIGHT/LEFT HEART CATH AND CORONARY ANGIOGRAPHY N/A 08/28/2017   Procedure: RIGHT/LEFT HEART CATH AND CORONARY ANGIOGRAPHY;  Surgeon: Larey Dresser, MD;  Location: [facility] CV LAB;  Service: Cardiovascular;  Laterality: N/A;  . TUBAL LIGATION      Allergies  Allergen Reactions  . Oxycodone Shortness Of Breath  . Entresto [Sacubitril-Valsartan] Other (See Comments)    Impaired coordination---causes patient to drop many things, blood pressure bottomed out    Current Outpatient Medications  Medication Sig Dispense Refill  . acetaminophen (TYLENOL) 650 MG CR tablet Take 650 mg by mouth every 8 (eight) hours as needed for pain.    [name] albuterol (PROVENTIL HFA;VENTOLIN HFA) 108 (90 Base) MCG/ACT inhaler Inhale 2 puffs into the lungs every 6 (six) hours as needed for wheezing or shortness of breath. 1 Inhaler 6  . albuterol (PROVENTIL) (2.5 MG/3ML) 0.083% nebulizer solution Take 3 mLs by nebulization every 4 (four) hours as needed for wheezing.    [name] aspirin EC 81 MG tablet Take 162 mg by mouth daily.    [name] atorvastatin (LIPITOR) 20 MG tablet TAKE 1 TABLET BY MOUTH DAILY 30 tablet 4  . carvedilol (COREG) 12.5 MG tablet TAKE ONE (1) TABLET BY MOUTH TWO (2) TIMES DAILY 180 tablet 1  . digoxin (DIGOX) 0.125 MG tablet Take 0.5 tablets (0.0625 mg total) by mouth daily. 15 tablet 6  . fluticasone furoate-vilanterol (BREO ELLIPTA) 200-25 MCG/INH AEPB Inhale 1 puff into the lungs daily.    [name]  glimepiride (AMARYL) 2 MG tablet Take 2 mg by mouth daily.    [name] losartan (COZAAR) 25 MG tablet Take 1 tablet (25 mg total) by mouth 2 (two) times daily. 60 tablet 3  . OXcarbazepine (TRILEPTAL) 150 MG tablet Take 150 mg by mouth 2 (two) times daily.    . potassium chloride SA (K-DUR,KLOR-CON) 20 MEQ tablet Take 1 tablet (20 mEq total) by mouth daily. 30 tablet 3  . pregabalin (LYRICA) 150 MG capsule Take 150 mg by mouth 2 (two) times daily.    [name] spironolactone (ALDACTONE) 25 MG tablet Take 1 tablet (25 mg total) by mouth daily. 30 tablet 3  . torsemide (DEMADEX) 10 MG tablet Take 8 tablets (80 mg total) by mouth daily. 120 tablet 3  . torsemide (DEMADEX) 20 MG tablet Take 4 tablets (80 mg total) every morning and 2 tablets (40 mg total) every other afternoon 180 tablet 1   No current facility-administered medications for this visit.  ROS: See HPI for pertinent positives and negatives.   Physical Examination  Vitals:   07/05/18 1159  BP: 111/65  Pulse: 60  Resp: 18  Temp: (!) 97.3 F (36.3 C)  TempSrc: Oral  SpO2: 94%  Weight: 190 lb (86.2 kg)  Height: 5\' 6"  (1.676 m)   Body mass index is 30.67 kg/m.  General: A&O x 3, WDWN, obese female. Gait: normal HENT: No gross abnormalities.  Eyes: PERRLA. Pulmonary: Respirations are non labored, CTAB, good air movement in all fields Cardiac: regular rhythm, no detected murmur.         Carotid Bruits Right Left   Positive Negative   Radial pulses: right is 1+ palpable, left is not palpable, left brachial pulse is not palpable.  Adominal aortic pulse is not palpable                         VASCULAR EXAM: Extremities without ischemic changes, without Gangrene; without open wounds. Fem-fem bypass graft pulse is not palpable (large panus)                                                                                                          LE Pulses Right Left       FEMORAL  faintly palpable  1+ palpable        POPLITEAL   not palpable   not palpable       POSTERIOR TIBIAL  not palpable   not palpable        DORSALIS PEDIS      ANTERIOR TIBIAL not palpable  not palpable    Abdomen: soft, NT, no palpable masses. Skin: no rashes, no cellulitis, no ulcers noted. Musculoskeletal: no muscle wasting or atrophy.  Neurologic: A&O X 3; appropriate affect, Sensation is normal; MOTOR FUNCTION:  moving all extremities equally, motor strength 5/5 in upper extremities, 4/5 in lower extremities. Speech is fluent/normal. CN 2-12 intact. Psychiatric: Thought content is normal, mood appropriate for clinical situation.     ASSESSMENT: Lisa [name] is a 72 y.o. female who iss/pleft to right femoral-femoral bypass, right femoral endarterectomy on 12/21/14. She also has known extracranial carotid artery stenosis. She has no hx of stroke or TIA.   PAOD: Fem-fem bypass graft pulse is not palpable (large panus). Bilateral ABI have improved signifcantly, mild disease bilaterally.  Bilateral femoral pulses are 1+ and faintly palpable. There are no signs of ischemia in her feet or legs.   She does not seem to walk enough to elicit claudication sx's as her walking is limited by dyspnea, painful neuropathy, and arthritis pain; nevertheless, she exercises daily on a stationary bike and other exercises.   Right radial pulse is not palpable, left radial pulse is not palpable, left brachial pulse is faintly palpable.  Left subclavian stenosis, has become mildly symptomatic.    She c/o occasional pressure feeling in lower abdomen for about a month, no aggravating or alleviating factors, denies needing to urinate any more often than usual (is taking a diuretic), denies dysuria. I  advised her to see her PCP ASAP re this to check her urine for  A UTI.     DATA  Carotid Duplex (07-05-18): Right ICA: 40-59% stenosis Left ICA: 60-79% stenosis Right vertebral flow is antegrade, left is retrograde Right subclavian artery  waveforms are normal, left are monophasic No change compared to the exam on 01-03-18.    ABI (Date: 07/05/2018):  R:   ABI: 0.90 (was 0.68 on 01-03-18),   PT: bi  DP: bi  TBI:  0.69, toe pressure 77, (was 0.52)  L:   ABI: 0.85 (was 0.66),   PT: bi  DP: bi  TBI: 0.54, toe pressure 60, (was 0.54) Significant improvement in bilateral ABI, mild disease bilaterally, with all biphasic waveforms, which improved from mono and biphasic. Improved right TBI, stable left TBI.     PLAN:  Based on the patient's vascular studies and examination, pt will return to clinic in 6 months with carotid duplex, ABI's, and fem-fem bypass duplex.  Walk briskly at least 30 minutes daily. Continue extensive exercise program.   I discussed in depth with the patient the nature of atherosclerosis, and emphasized the importance of maximal medical management including strict control of blood pressure, blood glucose, and lipid levels, obtaining regular exercise, and continued cessation of smoking.  The patient is aware that without maximal medical management the underlying atherosclerotic disease process will progress, limiting the benefit of any interventions.  The patient was given information about stroke prevention and what symptoms should prompt the patient to seek immediate medical care.   The patient was given information about PAD including signs, symptoms, treatment, what symptoms should prompt the patient to seek immediate medical care, and risk reduction measures to take.  Clemon Chambers, RN, MSN, FNP-C [facility] Phone: [phone]  Clinic MD: Donzetta Matters on call  07/05/18 8:03 PM

## 2018-07-08 ENCOUNTER — Other Ambulatory Visit (HOSPITAL_COMMUNITY): Payer: Medicare Other

## 2018-07-12 ENCOUNTER — Other Ambulatory Visit (HOSPITAL_COMMUNITY): Payer: Self-pay | Admitting: Cardiology

## 2018-07-15 ENCOUNTER — Telehealth (HOSPITAL_COMMUNITY): Payer: Self-pay | Admitting: Cardiology

## 2018-07-15 ENCOUNTER — Other Ambulatory Visit: Payer: Self-pay | Admitting: Cardiology

## 2018-07-15 DIAGNOSIS — I5022 Chronic systolic (congestive) heart failure: Secondary | ICD-10-CM

## 2018-07-15 MED ORDER — TORSEMIDE 20 MG PO TABS: 80.0000 mg | ORAL_TABLET | Freq: Every day | ORAL | 1 refills | Status: DC

## 2018-07-15 NOTE — Telephone Encounter (Signed)
Abnormal lab results received from lab cap Labs drawn 07/15/18 K 5.4 Cr 1.85 BUN 48 Na 137  Per Amy Clegg,NP Stop potassium Hold torsemide 80 mg x 2 days, then restart 80 mg daily Recheck bmet and dig x 7 days   Pt aware and voiced understanding

## 2018-07-16 LAB — BASIC METABOLIC PANEL
BUN / CREAT RATIO: 26 (ref 12–28)
BUN: 48 mg/dL — ABNORMAL HIGH (ref 8–27)
CO2: 27 mmol/L (ref 20–29)
CREATININE: 1.85 mg/dL — AB (ref 0.57–1.00)
Calcium: 9.9 mg/dL (ref 8.7–10.3)
Chloride: 96 mmol/L (ref 96–106)
GFR calc Af Amer: 31 mL/min/{1.73_m2} — ABNORMAL LOW (ref >59)
GFR, EST NON AFRICAN AMERICAN: 27 mL/min/{1.73_m2} — AB (ref >59)
Glucose: 153 mg/dL — ABNORMAL HIGH (ref 65–99)
POTASSIUM: 5.4 mmol/L — AB (ref 3.5–5.2)
SODIUM: 137 mmol/L (ref 134–144)

## 2018-07-16 LAB — SPECIMEN STATUS REPORT

## 2018-07-16 NOTE — Telephone Encounter (Signed)
Patient called asking for clarification on instructions with her medication changes again. She did not make any of the changes yesterday and already took her KCl this morning. I have asked her not to take KCl any more and to hold torsemide for 2 days then restart 80 mg daily on Thursday. I have scheduled her for a BMET in 1 week from today.   Ruta Hinds. Velva Harman, PharmD, BCPS, CPP Clinical Pharmacist Phone: [phone] 07/16/2018 12:45 PM

## 2018-07-17 ENCOUNTER — Ambulatory Visit (INDEPENDENT_AMBULATORY_CARE_PROVIDER_SITE_OTHER): Payer: Medicare Other | Admitting: *Deleted

## 2018-07-17 DIAGNOSIS — I429 Cardiomyopathy, unspecified: Secondary | ICD-10-CM

## 2018-07-17 NOTE — Progress Notes (Signed)
Remote ICD transmission.   

## 2018-07-18 ENCOUNTER — Other Ambulatory Visit (HOSPITAL_COMMUNITY): Payer: Self-pay | Admitting: Cardiology

## 2018-07-18 ENCOUNTER — Other Ambulatory Visit (HOSPITAL_COMMUNITY): Payer: Self-pay

## 2018-07-18 MED ORDER — DIGOXIN 125 MCG PO TABS: 0.0625 mg | ORAL_TABLET | Freq: Every day | ORAL | 5 refills | Status: DC

## 2018-07-18 NOTE — Telephone Encounter (Signed)
Pt called to state she is confused on how to take her digoxin. I advised her that she should take 1/2 (0.0625) everyday instead of 1/2 on MWF. Pt would like to speak to [facility] to help her manage her meds on her next OV on 11/04.

## 2018-07-19 ENCOUNTER — Other Ambulatory Visit (HOSPITAL_COMMUNITY): Payer: Self-pay | Admitting: Cardiology

## 2018-07-23 ENCOUNTER — Telehealth (HOSPITAL_COMMUNITY): Payer: Self-pay | Admitting: Pharmacist

## 2018-07-23 ENCOUNTER — Ambulatory Visit (HOSPITAL_COMMUNITY)
Admission: RE | Admit: 2018-07-23 | Discharge: 2018-07-23 | Disposition: A | Discharge status: Home / Self Care | Payer: Medicare Other | Source: Ambulatory Visit | Attending: Cardiology | Admitting: Cardiology

## 2018-07-23 DIAGNOSIS — I5022 Chronic systolic (congestive) heart failure: Secondary | ICD-10-CM | POA: Insufficient documentation

## 2018-07-23 LAB — BASIC METABOLIC PANEL WITH GFR
Anion gap: 13 (ref 5–15)
BUN: 61 mg/dL — ABNORMAL HIGH (ref 8–23)
CO2: 24 mmol/L (ref 22–32)
Calcium: 9.5 mg/dL (ref 8.9–10.3)
Chloride: 99 mmol/L (ref 98–111)
Creatinine, Ser: 1.9 mg/dL — ABNORMAL HIGH (ref 0.44–1.00)
GFR calc Af Amer: 30 mL/min — ABNORMAL LOW
GFR calc non Af Amer: 25 mL/min — ABNORMAL LOW
Glucose, Bld: 211 mg/dL — ABNORMAL HIGH (ref 70–99)
Potassium: 5.3 mmol/L — ABNORMAL HIGH (ref 3.5–5.1)
Sodium: 136 mmol/L (ref 135–145)

## 2018-07-23 NOTE — Telephone Encounter (Signed)
Spoke with patient today in clinic about her medications. She restarted her KCl after holding for 2 days and restarted torsemide at 80 mg QAM and 40 mg QOD in the pm. I have asked her not to take any KCl this morning and to wait for our call with her lab results.   Ruta Hinds. Velva Harman, PharmD, BCPS, CPP Clinical Pharmacist Phone: [phone] 07/23/2018 10:04 AM

## 2018-07-25 NOTE — Progress Notes (Signed)
Advanced Heart Failure Clinic Note  PCP: Dr. Loistine Simas Cardiology: Dr. Geraldo Pitter HF Cardiology: Dr. Aundra Dubin  72 y.o. with history of PAD, COPD, carotid stenosis, and chronic systolic CHF was referred by Dr. Geraldo Pitter for evaluation/treatment of CHF.    Patient has a long history of vascular disease.  She had left-right fem-fem bypass in 3/16. She has moderate bilateral carotid stenosis.  Echo in 2/18 showed EF 20-25%.  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  She was admitted in 9/18 to the hospital in [facility] for about 4 days with acute on chronic systolic CHF.    RHC/LHC in 12/18 showed nonobstructive coronary disease, low filling pressures, and CI 2.0.   Admitted 09/20/17 with volume overload. Diuresed with IV lasix. Weight went down 2 pounds. Discharged on torsemide 40 mg daily. Discharge weight 179 pounds.   Echo in 12/18 with EF 20-25%, mild basal ventricular hypertrophy, severe mid to apical LV hypertrophy.  Repeat echo in 2/19 was done with Definity contrast, showing that what was thought to be apical hypertrophy was actually prominent mid-apical trabeculation suggestive of noncompaction.   She was noted to have Bence-Jones protein on UPEP.  However, SPEP was negative and 24 hours UPEP was also negative. Abdominal fat pad biopsy did not show evidence of amyloidosis.  She saw heme-onc, probably does not have AL amyloidosis.  However, in 2/19, she had TcPYP scan that was strongly suggestive of transthyretin amyloidosis. She has peripheral neuropathy that may be due to amyloidosis as well. 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   Admitted 6/24-6/26/19 for LLL PNA. She received IV abx and prednisone. Transitioned to omnicef 300 mg BID and azirthromycin 250 mg daily. BP medications were held, but restarted at discharge.    She was admitted 11/1-11/3/19 with dizziness. She also had intermittent CP and SOB with lying flat x1 week. She had AKI with  creatinine up to 1.9. She was also having ataxia and unsteady gait. The hospitalist discussed with neuro. Head CT negative. Losartan, spiro, and torsemide were stopped with concerns for hypotension. Repeat echo showed EF 30-35%. Troponins were flat. PT was consulted and recommended [facility] PT for vestibular exercises, but it doesn't look like this was set up. Of note, she was also being treated for UTI prior to admission.  She returns today for post hospital follow up. She was just discharged yesterday. Overall doing better. Denies SOB or peripheral edema, but feels like she has some swelling around her eyes. +orthopnea. No further dizziness. No CP. She has not heard from [facility] (I dont think this this was set up prior to DC). She has tafamidis at her house, but has not yet started it. She is eating high salt foods. Drinks ~2 L/day. Taking all medications. Weight down 1 lb from last visit.  Biotronik: Thoracic impedence 84.7 (above threshold), decreased activity over the last 2 weeks, No AF. No VF/VT  Labs (8/18): LDL 80, HDL 46 Labs (10/18): K 4.2, creatinine 1.1 Labs (11/18): digoxin 0.3 Labs (12/18): K 3.5, creatinine 1.02, UPEP with faint Bence-Jones protein but 24 hour UPEP negative and SPEP negative.  Labs (1/19): LDL 79, HDL 38, BNP 493, hgb 13.6, K 5.8 => 4.8, creatinine 1.18, digoxin 0.3 Labs (2/19): K 3.3, creatinine 1.06 Labs (3/19): digoxin < 0.2 Labs (4/19): K 5, creatinine 0.97 Labs (6/19): K 3.5, creatinine 1.19 Labs (7/19): digoxin < 0.2, K 4.4, creatinine 1.14  PMH: 1. PAD: L=>R fem-fem bypass in 3/16.   - ABIs (3/18): Mildly abnormal,  0.81 left and 0.85 right.  - ABIs (4/19): 0.68 left and 0.66 right 2. H/o appendectomy 3. COPD: Quit smoking in 2/12.  - High resolution CT chest (2/19): No interstitial lung disease, +emphysema.  4. Arthritis: Bilateral TKR 5. Diet-controlled diabetes 6. Hyperlipidemia 7. Carotid stenosis:  - 10/18 Carotid dopplers: 60-79% RICA stenosis, 32-95%  LICA stenosis, left subclavian steal.  - 4/19 Carotid dopplers: 18-84% RICA, 16-60% LICA, left subclavian stenosis.  8. Chronic systolic CHF: Echo (6/30) with EF 20-25%, mild LV dilation, normal RV size and systolic function. 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 .  - LHC (10/17): Nonobstructive CAD.  - Biotronik ICD.  - LHC/RHC (12/18): 50% proximal PLOM (no obstructive disease). Mean RA 1, PA 32/10 mean 19, mean PCWP 7, CI 2.0.  - Echo (12/18): EF 20-25%, mild basal LV hypertrophy, severe mid to apical LV hypertrophy, moderate diastolic dysfunction, mild MR, PASP 42 mmHg.  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  - Workup appears negative for AL amyloidosis.  Negative abdominal fat pad biopsy.  However, PYP scan was suggestive of transthyretin amyloidosis.  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  10. Peripheral neuropathy: ?due to amyloidosis.  11. Gout 12. H/o Bell's palsy  SH: Lives with husband in [place], 3 daughters.  Quit smoking in 2012.    Family History  Problem Relation Age of Onset  . Heart disease Mother   . Hypertension Mother   Daughter with cardiomyopathy. Nephews with CAD.   Review of systems complete and found to be negative unless listed in HPI.   Current Outpatient Medications  Medication Sig Dispense Refill  . albuterol (PROVENTIL HFA;VENTOLIN HFA) 108 (90 Base) MCG/ACT inhaler Inhale 2 puffs into the lungs every 6 (six) hours as needed for wheezing or shortness of breath. 1 Inhaler 6  . albuterol (PROVENTIL) (2.5 MG/3ML) 0.083% nebulizer solution Take 3 mLs by nebulization every 4 (four) hours as needed for wheezing.    [name] allopurinol (ZYLOPRIM) 100 MG tablet Take 100 mg by mouth  daily.    [name] aspirin EC 81 MG tablet Take 162 mg by mouth daily.    [name] atorvastatin (LIPITOR) 20 MG tablet TAKE 1 TABLET BY MOUTH DAILY 30 tablet 4  . carvedilol (COREG) 12.5 MG tablet TAKE ONE (1) TABLET BY MOUTH TWO (2) TIMES DAILY 180 tablet 1  . digoxin (DIGOX) 0.125 MG tablet Take 0.5 tablets (0.0625 mg total) by mouth daily. 15 tablet 5  . fluticasone furoate-vilanterol (BREO ELLIPTA) 200-25 MCG/INH AEPB Inhale 1 puff into the lungs daily.    [name] glimepiride (AMARYL) 2 MG tablet Take 2 mg by mouth daily.    . nitrofurantoin, macrocrystal-monohydrate, (MACROBID) 100 MG capsule Take 100 mg by mouth 2 (two) times daily.    . OXcarbazepine (TRILEPTAL) 150 MG tablet Take 150 mg by mouth 3 (three) times daily.     . pregabalin (LYRICA) 150 MG capsule Take 150 mg by mouth 2 (two) times daily.    [name] losartan (COZAAR) 25 MG tablet Take 0.5 tablets (12.5 mg total) by mouth at bedtime. 15 tablet 3  . torsemide (DEMADEX) 20 MG tablet Take 2 tablets (40 mg total) by mouth daily. 60 tablet 3   No current facility-administered medications for this encounter.    BP 120/70  Pulse 64   Wt 87.5 kg (192 lb 12.8 oz)   SpO2 96%   BMI 32.08 kg/m   Wt Readings from Last 3 Encounters:  07/29/18 87.5 kg (192 lb 12.8 oz)  07/28/18 87.8 kg (193 lb 9.6 oz)  07/05/18 86.2 kg (190 lb)   General: Arrived in wheelchair. No resp difficulty. HEENT: Normal Neck: Supple. JVP 8-9. Carotids 2+ bilat; no bruits. No thyromegaly or nodule noted. Cor: PMI nondisplaced. RRR, No M/G/R noted Lungs: CTAB, normal effort. Abdomen: Soft, non-tender, non-distended, no HSM. No bruits or masses. +BS  Extremities: No cyanosis, clubbing, or rash. R and LLE no edema.  Neuro: Alert & orientedx3, cranial nerves grossly intact. moves all 4 extremities w/o difficulty. Affect pleasant  Assessment/Plan: 1. Chronic systolic CHF: Nonischemic cardiomyopathy.  EF 20-25% on echo in 2/18.  LHC/RHC in 12/18 with low filling pressures, CI 2.0,  and nonobstructive CAD. She has a Biotronik ICD.  Also of note, daughter and nephew have sarcoidosis => patient had a chest CT in 2/19 without evidence for pulmonary sarcoidosis.  We are unable to do an MRI to look for infiltrative disease due to ICD.  Echo in 12/18 showed EF 20-25%, noted to have LVH pattern that is mild in the basal segments and moderate to severe in the apical segments. This was concerning for apical HCM and study was repeated in 2/19 with Definity contrast.  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 We also did a workup for amyloidosis. She did not have evidence for AL amyloidosis, but TcPYP scan in 2/19 was strongly suggestive of transthyretin amyloidosis.  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 Genetic testing was negative, suggesting wild-type TTR amyloidosis. Echo 07/27/18: EF 30-35%, grade 1 DD.  NYHA class II symptoms. Volume status okay, but trending up.  - Resume torsemide 40 mg daily starting tomorrow. Advised to hold and call with recurrent dizziness.  - Resume losartan 12.5 mg qHS. - Continue digoxin, dig level 0.3 11/2 - Continue coreg 12.5 mg BID - Spiro held with recent hypotension. Consider adding back next visit if symptoms/BP okay.  - Narrow QRS, not CRT candidate.  - Discussed limiting fluid and salt intake - She has tafamidis at home. Encouraged her to start taking. - Refer for [facility] RN to help monitor BP at home.  2. PAD: s/p fem-fem bypass, followed by [facility].  No claudication, last ABIs somewhat worse in 4/19. She does not have clear claudication, pain in legs seems to be related to peripheral neuropathy and arthritis.  Continue to followup with [facility], sees Dr Oneida Alar soon. No change.  3. Carotid stenosis: Stable moderate stenosis on doppers. Follows with [facility]. No change 4. Hyperlipidemia: Continue  atorvastatin. Recent lipids were acceptable (LDL 79 09/2017). No change 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 She has peripheral neuropathy.  Genetic testing negative, wild-type.  - She has tafamidis at home. Encouraged her to start taking. 6. Unsteady gait - Refer to [facility] PT for vestibular and balance   Restart torsemide 40 mg daily, starting tomorrow Restart losartan 12.5 mg qHS Follow up in 2 weeks with BMET Advised to call us with extreme weight loss, weight gain, or recurrent dizziness Refer for [facility] PT and RN for vestibular exercises/gait training and for BP checks.  Georgiana Shore, NP 07/29/2018   Greater than 50% of the 25 minute visit was spent in  counseling/coordination of care regarding disease state education, salt/fluid restriction, sliding scale diuretics, and medication compliance.

## 2018-07-26 ENCOUNTER — Telehealth (HOSPITAL_COMMUNITY): Payer: Self-pay

## 2018-07-26 ENCOUNTER — Encounter (HOSPITAL_COMMUNITY): Payer: Self-pay | Admitting: Emergency Medicine

## 2018-07-26 ENCOUNTER — Observation Stay (HOSPITAL_COMMUNITY)
Admission: EM | Admit: 2018-07-26 | Discharge: 2018-07-28 | Disposition: A | Discharge status: Home / Self Care | Payer: Medicare Other | Attending: Internal Medicine | Admitting: Internal Medicine

## 2018-07-26 ENCOUNTER — Emergency Department (HOSPITAL_COMMUNITY): Payer: Medicare Other

## 2018-07-26 DIAGNOSIS — I420 Dilated cardiomyopathy: Secondary | ICD-10-CM | POA: Diagnosis not present

## 2018-07-26 DIAGNOSIS — R079 Chest pain, unspecified: Secondary | ICD-10-CM | POA: Diagnosis present

## 2018-07-26 DIAGNOSIS — N183 Chronic kidney disease, stage 3 (moderate): Secondary | ICD-10-CM | POA: Diagnosis not present

## 2018-07-26 DIAGNOSIS — Z7984 Long term (current) use of oral hypoglycemic drugs: Secondary | ICD-10-CM | POA: Insufficient documentation

## 2018-07-26 DIAGNOSIS — I739 Peripheral vascular disease, unspecified: Secondary | ICD-10-CM | POA: Diagnosis present

## 2018-07-26 DIAGNOSIS — E1165 Type 2 diabetes mellitus with hyperglycemia: Secondary | ICD-10-CM | POA: Insufficient documentation

## 2018-07-26 DIAGNOSIS — J449 Chronic obstructive pulmonary disease, unspecified: Secondary | ICD-10-CM | POA: Insufficient documentation

## 2018-07-26 DIAGNOSIS — I6782 Cerebral ischemia: Secondary | ICD-10-CM | POA: Insufficient documentation

## 2018-07-26 DIAGNOSIS — E1140 Type 2 diabetes mellitus with diabetic neuropathy, unspecified: Secondary | ICD-10-CM | POA: Insufficient documentation

## 2018-07-26 DIAGNOSIS — Z8249 Family history of ischemic heart disease and other diseases of the circulatory system: Secondary | ICD-10-CM | POA: Insufficient documentation

## 2018-07-26 DIAGNOSIS — R739 Hyperglycemia, unspecified: Secondary | ICD-10-CM | POA: Diagnosis present

## 2018-07-26 DIAGNOSIS — E1122 Type 2 diabetes mellitus with diabetic chronic kidney disease: Secondary | ICD-10-CM | POA: Insufficient documentation

## 2018-07-26 DIAGNOSIS — M1990 Unspecified osteoarthritis, unspecified site: Secondary | ICD-10-CM | POA: Insufficient documentation

## 2018-07-26 DIAGNOSIS — Z885 Allergy status to narcotic agent status: Secondary | ICD-10-CM | POA: Insufficient documentation

## 2018-07-26 DIAGNOSIS — I10 Essential (primary) hypertension: Secondary | ICD-10-CM | POA: Diagnosis present

## 2018-07-26 DIAGNOSIS — I5022 Chronic systolic (congestive) heart failure: Secondary | ICD-10-CM | POA: Diagnosis not present

## 2018-07-26 DIAGNOSIS — E1159 Type 2 diabetes mellitus with other circulatory complications: Secondary | ICD-10-CM | POA: Diagnosis present

## 2018-07-26 DIAGNOSIS — I130 Hypertensive heart and chronic kidney disease with heart failure and stage 1 through stage 4 chronic kidney disease, or unspecified chronic kidney disease: Secondary | ICD-10-CM | POA: Diagnosis not present

## 2018-07-26 DIAGNOSIS — K219 Gastro-esophageal reflux disease without esophagitis: Secondary | ICD-10-CM | POA: Insufficient documentation

## 2018-07-26 DIAGNOSIS — Z9889 Other specified postprocedural states: Secondary | ICD-10-CM | POA: Insufficient documentation

## 2018-07-26 DIAGNOSIS — Z87891 Personal history of nicotine dependence: Secondary | ICD-10-CM | POA: Diagnosis not present

## 2018-07-26 DIAGNOSIS — I428 Other cardiomyopathies: Secondary | ICD-10-CM | POA: Diagnosis not present

## 2018-07-26 DIAGNOSIS — Z955 Presence of coronary angioplasty implant and graft: Secondary | ICD-10-CM | POA: Insufficient documentation

## 2018-07-26 DIAGNOSIS — Z95810 Presence of automatic (implantable) cardiac defibrillator: Secondary | ICD-10-CM | POA: Diagnosis present

## 2018-07-26 DIAGNOSIS — M109 Gout, unspecified: Secondary | ICD-10-CM | POA: Insufficient documentation

## 2018-07-26 DIAGNOSIS — Z79899 Other long term (current) drug therapy: Secondary | ICD-10-CM | POA: Diagnosis not present

## 2018-07-26 DIAGNOSIS — Z90710 Acquired absence of both cervix and uterus: Secondary | ICD-10-CM | POA: Insufficient documentation

## 2018-07-26 DIAGNOSIS — I051 Rheumatic mitral insufficiency: Secondary | ICD-10-CM | POA: Diagnosis not present

## 2018-07-26 DIAGNOSIS — Z7982 Long term (current) use of aspirin: Secondary | ICD-10-CM | POA: Insufficient documentation

## 2018-07-26 DIAGNOSIS — E785 Hyperlipidemia, unspecified: Secondary | ICD-10-CM | POA: Diagnosis not present

## 2018-07-26 DIAGNOSIS — R0789 Other chest pain: Secondary | ICD-10-CM | POA: Diagnosis not present

## 2018-07-26 DIAGNOSIS — G510 Bell's palsy: Secondary | ICD-10-CM | POA: Insufficient documentation

## 2018-07-26 DIAGNOSIS — Z9660 Presence of unspecified orthopedic joint implant: Secondary | ICD-10-CM | POA: Insufficient documentation

## 2018-07-26 DIAGNOSIS — Z951 Presence of aortocoronary bypass graft: Secondary | ICD-10-CM | POA: Insufficient documentation

## 2018-07-26 DIAGNOSIS — R2681 Unsteadiness on feet: Secondary | ICD-10-CM | POA: Diagnosis not present

## 2018-07-26 DIAGNOSIS — E1151 Type 2 diabetes mellitus with diabetic peripheral angiopathy without gangrene: Principal | ICD-10-CM | POA: Insufficient documentation

## 2018-07-26 LAB — BASIC METABOLIC PANEL
ANION GAP: 11 (ref 5–15)
BUN: 37 mg/dL — ABNORMAL HIGH (ref 8–23)
CO2: 30 mmol/L (ref 22–32)
CREATININE: 1.27 mg/dL — AB (ref 0.44–1.00)
Calcium: 9.8 mg/dL (ref 8.9–10.3)
Chloride: 95 mmol/L — ABNORMAL LOW (ref 98–111)
GFR calc non Af Amer: 41 mL/min — ABNORMAL LOW (ref >60)
GFR, EST AFRICAN AMERICAN: 48 mL/min — AB (ref >60)
Glucose, Bld: 402 mg/dL — ABNORMAL HIGH (ref 70–99)
Potassium: 4.3 mmol/L (ref 3.5–5.1)
SODIUM: 136 mmol/L (ref 135–145)

## 2018-07-26 LAB — CBC
HEMATOCRIT: 42.4 % (ref 36.0–46.0)
Hemoglobin: 13 g/dL (ref 12.0–15.0)
MCH: 31.2 pg (ref 26.0–34.0)
MCHC: 30.7 g/dL (ref 30.0–36.0)
MCV: 101.7 fL — AB (ref 80.0–100.0)
NRBC: 0 % (ref 0.0–0.2)
Platelets: 232 10*3/uL (ref 150–400)
RBC: 4.17 MIL/uL (ref 3.87–5.11)
RDW: 13.7 % (ref 11.5–15.5)
WBC: 5 10*3/uL (ref 4.0–10.5)

## 2018-07-26 LAB — I-STAT TROPONIN, ED: Troponin i, poc: 0.04 ng/mL (ref 0.00–0.08)

## 2018-07-26 NOTE — ED Triage Notes (Addendum)
Reports chest pain started last night and that heart was out of rhythm one night.  Has pacer/defib.  Has not felt any shocks.    Describes pain as pressure over left chest with SOB.  Using proair and breo inhaler.  Also reports being seen by family physician yesterday.  Was told to hold torsemide and potassium and to take Lyrica twice a day instead of once a day.

## 2018-07-26 NOTE — Telephone Encounter (Signed)
Potassium high. Stop potassium. Hold torsemide for 2 days then restart torsemide 80 mg daily. Repeat BMET next week. Pt has an appointment on Monday wants lab work done then, pt agreed to plan verbalized understanding

## 2018-07-27 ENCOUNTER — Observation Stay (HOSPITAL_BASED_OUTPATIENT_CLINIC_OR_DEPARTMENT_OTHER): Payer: Medicare Other

## 2018-07-27 ENCOUNTER — Emergency Department (HOSPITAL_COMMUNITY): Payer: Medicare Other

## 2018-07-27 ENCOUNTER — Other Ambulatory Visit: Payer: Self-pay

## 2018-07-27 ENCOUNTER — Observation Stay (HOSPITAL_COMMUNITY): Payer: Medicare Other

## 2018-07-27 ENCOUNTER — Encounter (HOSPITAL_COMMUNITY): Payer: Self-pay | Admitting: Internal Medicine

## 2018-07-27 DIAGNOSIS — R739 Hyperglycemia, unspecified: Secondary | ICD-10-CM | POA: Diagnosis present

## 2018-07-27 DIAGNOSIS — R079 Chest pain, unspecified: Secondary | ICD-10-CM

## 2018-07-27 DIAGNOSIS — I5030 Unspecified diastolic (congestive) heart failure: Secondary | ICD-10-CM

## 2018-07-27 DIAGNOSIS — I428 Other cardiomyopathies: Secondary | ICD-10-CM | POA: Diagnosis not present

## 2018-07-27 DIAGNOSIS — E1159 Type 2 diabetes mellitus with other circulatory complications: Secondary | ICD-10-CM | POA: Diagnosis not present

## 2018-07-27 LAB — BRAIN NATRIURETIC PEPTIDE: B Natriuretic Peptide: 158.6 pg/mL — ABNORMAL HIGH (ref 0.0–100.0)

## 2018-07-27 LAB — SEDIMENTATION RATE: SED RATE: 14 mm/h (ref 0–22)

## 2018-07-27 LAB — URINALYSIS, ROUTINE W REFLEX MICROSCOPIC
Bilirubin Urine: NEGATIVE
GLUCOSE, UA: 150 mg/dL — AB
Hgb urine dipstick: NEGATIVE
Ketones, ur: NEGATIVE mg/dL
NITRITE: NEGATIVE
PH: 5 (ref 5.0–8.0)
PROTEIN: NEGATIVE mg/dL
Specific Gravity, Urine: 1.011 (ref 1.005–1.030)

## 2018-07-27 LAB — HEPATIC FUNCTION PANEL
ALK PHOS: 95 U/L (ref 38–126)
ALT: 33 U/L (ref 0–44)
AST: 43 U/L — ABNORMAL HIGH (ref 15–41)
Albumin: 4 g/dL (ref 3.5–5.0)
BILIRUBIN INDIRECT: 0.5 mg/dL (ref 0.3–0.9)
BILIRUBIN TOTAL: 0.7 mg/dL (ref 0.3–1.2)
Bilirubin, Direct: 0.2 mg/dL (ref 0.0–0.2)
Total Protein: 7.5 g/dL (ref 6.5–8.1)

## 2018-07-27 LAB — GLUCOSE, CAPILLARY
GLUCOSE-CAPILLARY: 195 mg/dL — AB (ref 70–99)
GLUCOSE-CAPILLARY: 256 mg/dL — AB (ref 70–99)
GLUCOSE-CAPILLARY: 216 mg/dL — AB (ref 70–99)
GLUCOSE-CAPILLARY: 264 mg/dL — AB (ref 70–99)

## 2018-07-27 LAB — TSH: TSH: 10.099 u[IU]/mL — ABNORMAL HIGH (ref 0.350–4.500)

## 2018-07-27 LAB — BASIC METABOLIC PANEL
Anion gap: 15 (ref 5–15)
BUN: 40 mg/dL — ABNORMAL HIGH (ref 8–23)
CALCIUM: 9.7 mg/dL (ref 8.9–10.3)
CHLORIDE: 97 mmol/L — AB (ref 98–111)
CO2: 27 mmol/L (ref 22–32)
CREATININE: 1.18 mg/dL — AB (ref 0.44–1.00)
GFR calc Af Amer: 52 mL/min — ABNORMAL LOW (ref >60)
GFR calc non Af Amer: 45 mL/min — ABNORMAL LOW (ref >60)
Glucose, Bld: 259 mg/dL — ABNORMAL HIGH (ref 70–99)
Potassium: 4.2 mmol/L (ref 3.5–5.1)
Sodium: 139 mmol/L (ref 135–145)

## 2018-07-27 LAB — TROPONIN I
TROPONIN I: 0.06 ng/mL — AB (ref <0.03)
Troponin I: 0.06 ng/mL (ref <0.03)

## 2018-07-27 LAB — MAGNESIUM: MAGNESIUM: 1.9 mg/dL (ref 1.7–2.4)

## 2018-07-27 LAB — ECHOCARDIOGRAM COMPLETE
HEIGHTINCHES: 65 in
WEIGHTICAEL: 3102.4 [oz_av]

## 2018-07-27 LAB — I-STAT TROPONIN, ED: TROPONIN I, POC: 0.05 ng/mL (ref 0.00–0.08)

## 2018-07-27 LAB — DIGOXIN LEVEL: Digoxin Level: 0.3 ng/mL — ABNORMAL LOW (ref 0.8–2.0)

## 2018-07-27 LAB — VITAMIN B12: Vitamin B-12: 234 pg/mL (ref 180–914)

## 2018-07-27 LAB — D-DIMER, QUANTITATIVE: D-Dimer, Quant: 0.68 ug/mL-FEU — ABNORMAL HIGH (ref 0.00–0.50)

## 2018-07-27 LAB — CBG MONITORING, ED: Glucose-Capillary: 235 mg/dL — ABNORMAL HIGH (ref 70–99)

## 2018-07-27 LAB — CK: Total CK: 145 U/L (ref 38–234)

## 2018-07-27 MED ORDER — PREGABALIN 75 MG PO CAPS
150.0000 mg | ORAL_CAPSULE | Freq: Two times a day (BID) | ORAL | Status: DC
  Administered 2018-07-27 – 2018-07-28 (×3): 150 mg via ORAL
  Filled 2018-07-27 (×3): qty 2

## 2018-07-27 MED ORDER — INSULIN ASPART 100 UNIT/ML ~~LOC~~ SOLN
0.0000 [IU] | Freq: Three times a day (TID) | SUBCUTANEOUS | Status: DC
  Administered 2018-07-27: 5 [IU] via SUBCUTANEOUS
  Administered 2018-07-27: 2 [IU] via SUBCUTANEOUS
  Administered 2018-07-27: 3 [IU] via SUBCUTANEOUS
  Administered 2018-07-28: 2 [IU] via SUBCUTANEOUS

## 2018-07-27 MED ORDER — ASPIRIN EC 81 MG PO TBEC
162.0000 mg | DELAYED_RELEASE_TABLET | Freq: Every day | ORAL | Status: DC
  Administered 2018-07-27 – 2018-07-28 (×2): 162 mg via ORAL
  Filled 2018-07-27 (×2): qty 2

## 2018-07-27 MED ORDER — PNEUMOCOCCAL VAC POLYVALENT 25 MCG/0.5ML IJ INJ
0.5000 mL | INJECTION | INTRAMUSCULAR | Status: DC
  Filled 2018-07-27: qty 0.5

## 2018-07-27 MED ORDER — ATORVASTATIN CALCIUM 20 MG PO TABS
20.0000 mg | ORAL_TABLET | Freq: Every day | ORAL | Status: DC
  Administered 2018-07-27: 20 mg via ORAL
  Filled 2018-07-27: qty 1

## 2018-07-27 MED ORDER — LOSARTAN POTASSIUM 25 MG PO TABS
25.0000 mg | ORAL_TABLET | Freq: Two times a day (BID) | ORAL | Status: DC
  Administered 2018-07-28: 25 mg via ORAL
  Filled 2018-07-27 (×2): qty 1

## 2018-07-27 MED ORDER — OXCARBAZEPINE 150 MG PO TABS
150.0000 mg | ORAL_TABLET | Freq: Three times a day (TID) | ORAL | Status: DC
  Administered 2018-07-27 (×3): 150 mg via ORAL
  Filled 2018-07-27 (×6): qty 1

## 2018-07-27 MED ORDER — TORSEMIDE 20 MG PO TABS: 20.0000 mg | ORAL_TABLET | ORAL | Status: DC

## 2018-07-27 MED ORDER — TORSEMIDE 20 MG PO TABS
80.0000 mg | ORAL_TABLET | Freq: Every day | ORAL | Status: DC
  Administered 2018-07-28: 80 mg via ORAL
  Filled 2018-07-27 (×2): qty 4

## 2018-07-27 MED ORDER — ONDANSETRON HCL 4 MG/2ML IJ SOLN: 4.0000 mg | Freq: Four times a day (QID) | INTRAMUSCULAR | Status: DC | PRN

## 2018-07-27 MED ORDER — ALBUTEROL SULFATE (2.5 MG/3ML) 0.083% IN NEBU: 3.0000 mL | INHALATION_SOLUTION | RESPIRATORY_TRACT | Status: DC | PRN

## 2018-07-27 MED ORDER — SODIUM CHLORIDE 0.9 % IV SOLN
1.0000 g | INTRAVENOUS | Status: DC
  Administered 2018-07-27 – 2018-07-28 (×2): 1 g via INTRAVENOUS
  Filled 2018-07-27 (×2): qty 10

## 2018-07-27 MED ORDER — ALLOPURINOL 100 MG PO TABS
100.0000 mg | ORAL_TABLET | Freq: Every day | ORAL | Status: DC
  Administered 2018-07-27 – 2018-07-28 (×2): 100 mg via ORAL
  Filled 2018-07-27 (×2): qty 1

## 2018-07-27 MED ORDER — ACETAMINOPHEN 650 MG RE SUPP: 650.0000 mg | Freq: Four times a day (QID) | RECTAL | Status: DC | PRN

## 2018-07-27 MED ORDER — SPIRONOLACTONE 25 MG PO TABS
25.0000 mg | ORAL_TABLET | Freq: Every day | ORAL | Status: DC
Start: 2018-07-27 — End: 2018-07-28
  Administered 2018-07-27 – 2018-07-28 (×2): 25 mg via ORAL
  Filled 2018-07-27 (×2): qty 1

## 2018-07-27 MED ORDER — FLUTICASONE FUROATE-VILANTEROL 200-25 MCG/INH IN AEPB
1.0000 | INHALATION_SPRAY | Freq: Every day | RESPIRATORY_TRACT | Status: DC
  Administered 2018-07-28: 1 via RESPIRATORY_TRACT
  Filled 2018-07-27: qty 28

## 2018-07-27 MED ORDER — ACETAMINOPHEN 325 MG PO TABS
650.0000 mg | ORAL_TABLET | Freq: Four times a day (QID) | ORAL | Status: DC | PRN
  Administered 2018-07-28: 650 mg via ORAL
  Filled 2018-07-27: qty 2

## 2018-07-27 MED ORDER — DIGOXIN 125 MCG PO TABS
0.0625 mg | ORAL_TABLET | Freq: Every day | ORAL | Status: DC
  Administered 2018-07-27 – 2018-07-28 (×2): 0.0625 mg via ORAL
  Filled 2018-07-27 (×2): qty 1

## 2018-07-27 MED ORDER — ONDANSETRON HCL 4 MG PO TABS: 4.0000 mg | ORAL_TABLET | Freq: Four times a day (QID) | ORAL | Status: DC | PRN

## 2018-07-27 MED ORDER — POTASSIUM CHLORIDE CRYS ER 20 MEQ PO TBCR
20.0000 meq | EXTENDED_RELEASE_TABLET | Freq: Every day | ORAL | Status: DC
  Administered 2018-07-28: 20 meq via ORAL
  Filled 2018-07-27 (×2): qty 1

## 2018-07-27 MED ORDER — CARVEDILOL 12.5 MG PO TABS
12.5000 mg | ORAL_TABLET | Freq: Two times a day (BID) | ORAL | Status: DC
Start: 2018-07-27 — End: 2018-07-28
  Administered 2018-07-27 – 2018-07-28 (×3): 12.5 mg via ORAL
  Filled 2018-07-27 (×3): qty 1

## 2018-07-27 NOTE — ED Notes (Signed)
[employer] notified of need to interrogate ICD.  Pt's room number on the floor given.  Rep will respond to floor to interrogate ICD.

## 2018-07-27 NOTE — ED Notes (Signed)
Patient transported to X-ray 

## 2018-07-27 NOTE — Progress Notes (Signed)
Pt's D-dimer was 0.68 earlier, notified on call MD, will continue to monitor, Thanks Arvella Nigh RN.

## 2018-07-27 NOTE — ED Notes (Signed)
CBG was 235

## 2018-07-27 NOTE — ED Notes (Signed)
Pt states that she is currently pain free however does not feel like herself.  Pt's daughter believes there may be a problem w/ all the medications pt is currently taking.

## 2018-07-27 NOTE — ED Notes (Signed)
Pt ambulated from room to bathroom w/ 2 person assist. Pt's gait noted to be unsteady and pt continuously reached for objects to hold on to while ambulating.

## 2018-07-27 NOTE — Progress Notes (Signed)
  Echocardiogram 2D Echocardiogram has been performed.  Randa Lynn Drue Harr 07/27/2018, 12:28 PM

## 2018-07-27 NOTE — Progress Notes (Signed)
Work-up continues.  ABIs canceled as they were obtained in October 2019.  Showed abnormal keel indices in the bilateral great toes.

## 2018-07-27 NOTE — Progress Notes (Signed)
Attempted report x1. 

## 2018-07-27 NOTE — ED Provider Notes (Signed)
TIME SEEN: 1:48 AM  CHIEF COMPLAINT: Chest pain, ataxia  HPI: Patient is a 72 year old female with history of CHF status post pacemaker/defibrillator, hypertension, diabetes hyperlipidemia, coronary artery disease who presents to the emergency department with complaints of intermittent chest pain for the past week.  Worse with lying flat and associated with shortness of breath and dizziness.  No nausea, vomiting or diaphoresis.  Describes her dizziness as feeling like the room is spinning.  She states that she has also had a week of unsteady gait.  Daughter also reports intermittent confusion which is new for her.  No headache or head injury.  No numbness or focal weakness.  No chest pain or shortness of breath currently.  ROS: See HPI Constitutional: no fever  Eyes: no drainage  ENT: no runny nose   Cardiovascular:   chest pain  Resp: SOB  GI: no vomiting GU: no dysuria Integumentary: no rash  Allergy: no hives  Musculoskeletal: no leg swelling  Neurological: no slurred speech ROS otherwise negative  PAST MEDICAL HISTORY/PAST SURGICAL HISTORY:  Past Medical History:  Diagnosis Date  . AICD (automatic cardioverter/defibrillator) present 2014   Biotroniks  . Arthritis    knees with arthritis as well as has back, pt. remarks that her hands fall asleep when she is in the bed or in certain positions   . Bell's palsy   . Carotid artery disease ([place])    a. duplex - moderate bilateral carotid disease (60-79% 06/2017).  . Chronic systolic CHF (congestive heart failure) ([place])   . COPD (chronic obstructive pulmonary disease) ([place])   . Diabetes mellitus without complication (HCC)    borderline  . Former tobacco use   . GERD (gastroesophageal reflux disease)    uses otc for occas. heartburn   . Hyperlipidemia   . Hypertension   . Mild CAD    a. nonobstructive CAD (50% PLOM 08/2017).  . Neuropathy   . Non-ischemic cardiomyopathy ([place])   . PAD (peripheral artery disease) ([place])    a.   s/p L-R fem-fem BPG 2016.  [name] Pneumonia    hosp. in [place]-2015  . Subclavian steal syndrome    a. h/o left subclavian steal.    MEDICATIONS:  Prior to Admission medications   Medication Sig Start Date End Date Taking? Authorizing Provider  acetaminophen (TYLENOL) 650 MG CR tablet Take 650 mg by mouth every 8 (eight) hours as needed for pain.    [provider]  albuterol (PROVENTIL HFA;VENTOLIN HFA) 108 (90 Base) MCG/ACT inhaler Inhale 2 puffs into the lungs every 6 (six) hours as needed for wheezing or shortness of breath. 03/20/18   Lockamy, Timothy, DO  albuterol (PROVENTIL) (2.5 MG/3ML) 0.083% nebulizer solution Take 3 mLs by nebulization every 4 (four) hours as needed for wheezing. 08/01/17   [provider]  allopurinol (ZYLOPRIM) 100 MG tablet Take 100 mg by mouth daily.    [provider]  aspirin EC 81 MG tablet Take 162 mg by mouth daily.    [provider]  atorvastatin (LIPITOR) 20 MG tablet TAKE 1 TABLET BY MOUTH DAILY 04/18/18   Bensimhon, Shaune Pascal, MD  carvedilol (COREG) 12.5 MG tablet TAKE ONE (1) TABLET BY MOUTH TWO (2) TIMES DAILY 03/14/18   Revankar, Reita Cliche, MD  digoxin (DIGOX) 0.125 MG tablet Take 0.5 tablets (0.0625 mg total) by mouth daily. 07/18/18   Larey Dresser, MD  fluticasone furoate-vilanterol (BREO ELLIPTA) 200-25 MCG/INH AEPB Inhale 1 puff into the lungs daily.    [provider]  glimepiride (AMARYL) 2 MG tablet Take 2 mg by mouth daily. 07/11/17   [provider]  KLOR-CON M20 20 MEQ tablet TAKE 1 TABLET BY MOUTH EVERY DAY 07/18/18   Larey Dresser, MD  losartan (COZAAR) 25 MG tablet Take 25 mg by mouth 2 (two) times daily.     [provider]  OXcarbazepine (TRILEPTAL) 150 MG tablet Take 150 mg by mouth 3 (three) times daily.  05/14/17   [provider]  pregabalin (LYRICA) 150 MG capsule Take 150 mg by mouth 2 (two) times daily.    [provider]  spironolactone (ALDACTONE)  25 MG tablet Take 1 tablet (25 mg total) by mouth daily. 06/03/18   Larey Dresser, MD  torsemide (DEMADEX) 20 MG tablet Take 4 tablets (80 mg) daily and 2 tablets (40 mg) every other day    [provider]    ALLERGIES:  Allergies  Allergen Reactions  . Oxycodone Shortness Of Breath  . Entresto [Sacubitril-Valsartan] Other (See Comments)    Impaired coordination---causes patient to drop many things, blood pressure bottomed out    SOCIAL HISTORY:  Social History   Tobacco Use  . Smoking status: Former Smoker    Types: Cigarettes    Last attempt to quit: 11/14/2010    Years since quitting: 7.7  . Smokeless tobacco: Never Used  Substance Use Topics  . Alcohol use: No    Alcohol/week: 0.0 standard drinks    FAMILY HISTORY: Family History  Problem Relation Age of Onset  . Heart disease Mother   . Hypertension Mother     EXAM: BP 129/71   Pulse (!) 44   Temp 97.7 F (36.5 C) (Oral)   Resp 14   Ht 5\' 5"  (1.651 m)   Wt 86.8 kg   SpO2 97%   BMI 31.85 kg/m  CONSTITUTIONAL: Alert and oriented and responds appropriately to questions.  Elderly, obese HEAD: Normocephalic EYES: Conjunctivae clear, pupils appear equal, EOMI ENT: normal nose; moist mucous membranes NECK: Supple, no meningismus, no nuchal rigidity, no LAD  CARD: RRR; S1 and S2 appreciated; no murmurs, no clicks, no rubs, no gallops RESP: Normal chest excursion without splinting or tachypnea; breath sounds clear and equal bilaterally; no wheezes, no rhonchi, no rales, no hypoxia or respiratory distress, speaking full sentences ABD/GI: Normal bowel sounds; non-distended; soft, non-tender, no rebound, no guarding, no peritoneal signs, no hepatosplenomegaly BACK:  The back appears normal and is non-tender to palpation, there is no CVA tenderness EXT: Normal ROM in all joints; non-tender to palpation; no edema; normal capillary refill; no cyanosis, no calf tenderness or swelling    SKIN: Normal color for age  and race; warm; no rash NEURO: Moves all extremities equally, sensation to light touch intact diffusely, cranial nerves II through XII intact, normal speech, very unsteady gait PSYCH: The patient's mood and manner are appropriate. Grooming and personal hygiene are appropriate.  MEDICAL DECISION MAKING: Patient here with chest pain or shortness of breath.  Has history of CHF and CAD.  No sign of volume overload on exam.  First troponin negative.  EKG shows no new ischemic changes.  Chest x-ray clear.  Given her risk factors, I feel she needs admission for chest pain rule out.   Patient also complaining of ataxic gait.  Unable to obtain MRI of her brain given she has a Investment banker, corporate.  Will obtain head CT.  ED PROGRESS: Patient's head CT shows no acute abnormality.  She has been unable  to ambulate here without significant assistance which is new for her per patient and daughter.  Will discuss with hospitalist for admission.   3:28 AM Discussed patient's case with hospitalist, Dr. Hal Hope.  I have recommended admission and patient (and family if present) agree with this plan. Admitting physician will place admission orders.   I reviewed all nursing notes, vitals, pertinent previous records, EKGs, lab and urine results, imaging (as available).      EKG Interpretation  Date/Time:  Friday July 26 2018 21:46:59 EDT Ventricular Rate:  82 PR Interval:  242 QRS Duration: 104 QT Interval:  394 QTC Calculation: 460 R Axis:   -104 Text Interpretation:  Sinus rhythm with 1st degree A-V block with occasional Premature ventricular complexes Septal infarct , age undetermined Lateral infarct , age undetermined Abnormal ECG No significant change since last tracing Confirmed by Jonni Oelkers, Cyril Mourning [id number]) on 07/27/2018 1:50:20 AM          Hildegarde Dunaway, Delice Bison, DO 07/27/18 3013

## 2018-07-27 NOTE — Progress Notes (Signed)
CRITICAL VALUE ALERT  Critical Value:  Troponin 0.06  Date & Time Notied:  07/27/2018 @ 1200  Provider Notified: Evangeline Gula MD

## 2018-07-27 NOTE — H&P (Addendum)
History and Physical    Lisa [name] VEL:[id number] DOB: 05/29/1946 DOA: 07/26/2018  PCP: [name], MD   Patient coming from: Home.  Chief Complaint: Chest pain.  HPI: Lisa [name] is a 72 y.o. female with history of nonischemic cardiomyopathy status post AICD placement, hypertension, chronic kidney disease, COPD, diabetes mellitus type 2, neuropathy presents to the ER because of chest pain.  Patient states over the last 1 week patient has been having chest pain mostly on lying down.  Last for few minutes and resolves.  Denies any shortness of breath electric cough fever chills.  At the same time patient also has been having ataxia for last 1 week.  Denies any fall or any weakness of the upper or lower extremities.  When patient tries to walk patient is having balance issues.  No change in medications recently.  2 days ago patient was placed on Macrodantin for urinary symptoms by patient's cardiologist.  Complains of increasing lower extremity pain.  ED Course: In the ER patient is found to be ataxic but able to move all extremities 5 x 5.  CT head was unremarkable.  EKG was showing normal sinus rhythm with PVCs.  Troponin was negative.  Blood sugars are high but no elevated anion gap.  Review of Systems: As per HPI, rest all negative.   Past Medical History:  Diagnosis Date  . AICD (automatic cardioverter/defibrillator) present 2014   Biotroniks  . Arthritis    knees with arthritis as well as has back, pt. remarks that her hands fall asleep when she is in the bed or in certain positions   . Bell's palsy   . Carotid artery disease ([place])    a. duplex - moderate bilateral carotid disease (60-79% 06/2017).  . Chronic systolic CHF (congestive heart failure) ([place])   . COPD (chronic obstructive pulmonary disease) ([place])   . Diabetes mellitus without complication (HCC)    borderline  . Former tobacco use   . GERD (gastroesophageal reflux disease)    uses otc for occas.  heartburn   . Hyperlipidemia   . Hypertension   . Mild CAD    a. nonobstructive CAD (50% PLOM 08/2017).  . Neuropathy   . Non-ischemic cardiomyopathy ([place])   . PAD (peripheral artery disease) ([place])    a.  s/p L-R fem-fem BPG 2016.  [name] Pneumonia    hosp. in [place]-2015  . Subclavian steal syndrome    a. h/o left subclavian steal.    Past Surgical History:  Procedure Laterality Date  . ABDOMINAL AORTAGRAM N/A 11/27/2014   Procedure: ABDOMINAL Maxcine Ham;  Surgeon: Elam Dutch, MD;  Location: [facility] CATH LAB;  Service: Cardiovascular;  Laterality: N/A;  . ABDOMINAL HYSTERECTOMY    . APPENDECTOMY    . CARDIAC CATHETERIZATION     02/20/13: Normal coronaries, moderate LV dysfunction, EF 35%. Medical RX ([facility])  . CARDIAC DEFIBRILLATOR PLACEMENT  Aug. 2014   dual chamber [employer] ICD 05/06/13 ([facility], Dr. Minna Merritts)  . ENDARTERECTOMY FEMORAL Right 12/21/2014   Procedure: ENDARTERECTOMY FEMORAL;  Surgeon: Elam Dutch, MD;  Location: [place];  Service: Vascular;  Laterality: Right;  . FEMORAL-FEMORAL BYPASS GRAFT Bilateral 12/21/2014   Procedure: BYPASS GRAFT LEFT FEMORAL-RIGHT FEMORAL ARTERY;  Surgeon: Elam Dutch, MD;  Location: [place];  Service: Vascular;  Laterality: Bilateral;  . JOINT REPLACEMENT Bilateral    implants  . RIGHT/LEFT HEART CATH AND CORONARY ANGIOGRAPHY N/A 08/28/2017   Procedure: RIGHT/LEFT HEART CATH AND CORONARY ANGIOGRAPHY;  Surgeon: Aundra Dubin,  Elby Showers, MD;  Location: [place] CV LAB;  Service: Cardiovascular;  Laterality: N/A;  . TUBAL LIGATION       reports that she quit smoking about 7 years ago. Her smoking use included cigarettes. She has never used smokeless tobacco. She reports that she does not drink alcohol or use drugs.  Allergies  Allergen Reactions  . Oxycodone Shortness Of Breath  . Entresto [Sacubitril-Valsartan] Other (See Comments)    Impaired coordination---causes patient to drop many things, blood pressure bottomed out    Family History    Problem Relation Age of Onset  . Heart disease Mother   . Hypertension Mother     Prior to Admission medications   Medication Sig Start Date End Date Taking? Authorizing Provider  albuterol (PROVENTIL HFA;VENTOLIN HFA) 108 (90 Base) MCG/ACT inhaler Inhale 2 puffs into the lungs every 6 (six) hours as needed for wheezing or shortness of breath. 03/20/18  Yes Lockamy, Timothy, DO  albuterol (PROVENTIL) (2.5 MG/3ML) 0.083% nebulizer solution Take 3 mLs by nebulization every 4 (four) hours as needed for wheezing. 08/01/17  Yes [provider]  allopurinol (ZYLOPRIM) 100 MG tablet Take 100 mg by mouth daily.   Yes [provider]  aspirin EC 81 MG tablet Take 162 mg by mouth daily.   Yes [provider]  atorvastatin (LIPITOR) 20 MG tablet TAKE 1 TABLET BY MOUTH DAILY Patient taking differently: Take 20 mg by mouth daily at 6 PM.  04/18/18  Yes Bensimhon, Shaune Pascal, MD  carvedilol (COREG) 12.5 MG tablet TAKE ONE (1) TABLET BY MOUTH TWO (2) TIMES DAILY Patient taking differently: Take 12.5 mg by mouth 2 (two) times daily with a meal.  03/14/18  Yes Revankar, Reita Cliche, MD  digoxin (DIGOX) 0.125 MG tablet Take 0.5 tablets (0.0625 mg total) by mouth daily. 07/18/18  Yes Larey Dresser, MD  fluticasone furoate-vilanterol (BREO ELLIPTA) 200-25 MCG/INH AEPB Inhale 1 puff into the lungs daily.   Yes [provider]  glimepiride (AMARYL) 2 MG tablet Take 2 mg by mouth daily. 07/11/17  Yes [provider]  KLOR-CON M20 20 MEQ tablet TAKE 1 TABLET BY MOUTH EVERY DAY Patient taking differently: Take 20 mEq by mouth daily.  07/18/18  Yes Larey Dresser, MD  losartan (COZAAR) 25 MG tablet Take 25 mg by mouth 2 (two) times daily.    Yes [provider]  nitrofurantoin, macrocrystal-monohydrate, (MACROBID) 100 MG capsule Take 100 mg by mouth 2 (two) times daily.   Yes [provider]  OXcarbazepine (TRILEPTAL) 150 MG tablet Take 150 mg by mouth 3  (three) times daily.  05/14/17  Yes [provider]  pregabalin (LYRICA) 150 MG capsule Take 150 mg by mouth 2 (two) times daily.   Yes [provider]  spironolactone (ALDACTONE) 25 MG tablet Take 1 tablet (25 mg total) by mouth daily. 06/03/18  Yes Larey Dresser, MD  torsemide (DEMADEX) 20 MG tablet Take 40-80 mg by mouth See admin instructions. Take 4 tablets (80 mg) daily and 2 tablets (40 mg) every other night   Yes [provider]    Physical Exam: Vitals:   07/27/18 0100 07/27/18 0116 07/27/18 0410 07/27/18 0514  BP: (!) 124/55 115/63 122/89 134/64  Pulse: 63 71 73 65  Resp: 18 19 (!) 21 18  Temp:    97.8 F (36.6 C)  TempSrc:    Oral  SpO2: 99% 97% 97% 94%  Weight:    88 kg  Height:  5\' 5"  (1.651 m)      Constitutional: Moderately built and nourished. Vitals:   07/27/18 0100 07/27/18 0116 07/27/18 0410 07/27/18 0514  BP: (!) 124/55 115/63 122/89 134/64  Pulse: 63 71 73 65  Resp: 18 19 (!) 21 18  Temp:    97.8 F (36.6 C)  TempSrc:    Oral  SpO2: 99% 97% 97% 94%  Weight:    88 kg  Height:    5\' 5"  (1.651 m)   Eyes: Anicteric no pallor. ENMT: No discharge from the ears eyes nose or mouth. Neck: No mass or.  No neck rigidity but no JVD appreciated. Respiratory: No rhonchi or crepitations. Cardiovascular: S1-S2 heard no murmurs appreciated. Abdomen: Soft nontender bowel sounds present. Musculoskeletal: No edema.  No joint effusion. Skin: No rash. Neurologic: Alert awake oriented to time place and person.  Moves all extremities 5 x 5.  No facial asymmetry tongue is midline.  Pupils equal and reacting to light.  On trying to make walk patient feels ataxic.  Dizzy. Psychiatric: Appears normal.   Labs on Admission: I have personally reviewed following labs and imaging studies  CBC: Recent Labs  Lab 07/26/18 2155  WBC 5.0  HGB 13.0  HCT 42.4  MCV 101.7*  PLT 440   Basic Metabolic Panel: Recent Labs  Lab 07/23/18 0957  07/26/18 2155  NA 136 136  K 5.3* 4.3  CL 99 95*  CO2 24 30  GLUCOSE 211* 402*  BUN 61* 37*  CREATININE 1.90* 1.27*  CALCIUM 9.5 9.8   GFR: Estimated Creatinine Clearance: 44.5 mL/min (A) (by C-G formula based on SCr of 1.27 mg/dL (H)). Liver Function Tests: No results for input(s): AST, ALT, ALKPHOS, BILITOT, PROT, ALBUMIN in the last 168 hours. No results for input(s): LIPASE, AMYLASE in the last 168 hours. No results for input(s): AMMONIA in the last 168 hours. Coagulation Profile: No results for input(s): INR, PROTIME in the last 168 hours. Cardiac Enzymes: No results for input(s): CKTOTAL, CKMB, CKMBINDEX, TROPONINI in the last 168 hours. BNP (last 3 results) No results for input(s): PROBNP in the last 8760 hours. HbA1C: No results for input(s): HGBA1C in the last 72 hours. CBG: Recent Labs  Lab 07/27/18 0241  GLUCAP 235*   Lipid Profile: No results for input(s): CHOL, HDL, LDLCALC, TRIG, CHOLHDL, LDLDIRECT in the last 72 hours. Thyroid Function Tests: No results for input(s): TSH, T4TOTAL, FREET4, T3FREE, THYROIDAB in the last 72 hours. Anemia Panel: No results for input(s): VITAMINB12, FOLATE, FERRITIN, TIBC, IRON, RETICCTPCT in the last 72 hours. Urine analysis:    Component Value Date/Time   COLORURINE YELLOW 07/27/2018 0250   APPEARANCEUR CLEAR 07/27/2018 0250   LABSPEC 1.011 07/27/2018 0250   PHURINE 5.0 07/27/2018 0250   GLUCOSEU 150 (A) 07/27/2018 0250   HGBUR NEGATIVE 07/27/2018 0250   BILIRUBINUR NEGATIVE 07/27/2018 0250   KETONESUR NEGATIVE 07/27/2018 0250   PROTEINUR NEGATIVE 07/27/2018 0250   UROBILINOGEN 0.2 12/11/2014 1131   NITRITE NEGATIVE 07/27/2018 0250   LEUKOCYTESUR TRACE (A) 07/27/2018 0250   Sepsis Labs: @LABRCNTIP (procalcitonin:4,lacticidven:4) )No results found for this or any previous visit (from the past 240 hour(s)).   Radiological Exams on Admission: Dg Chest 2 View  Result Date: 07/26/2018 CLINICAL DATA:  72 year old  female with chest pain. EXAM: CHEST - 2 VIEW COMPARISON:  Chest radiograph dated 03/27/2018 FINDINGS: Mild diffuse interstitial coarsening. No focal consolidation, pleural effusion, or pneumothorax. Stable cardiac silhouette. Left pectoral AICD device. No acute osseous pathology. Atherosclerotic calcification of the  aorta. IMPRESSION: No active cardiopulmonary disease. Electronically Signed   By: Anner Crete M.D.   On: 07/26/2018 22:09   Ct Head Wo Contrast  Result Date: 07/27/2018 CLINICAL DATA:  72 year old female with ataxia and confusion. EXAM: CT HEAD WITHOUT CONTRAST TECHNIQUE: Contiguous axial images were obtained from the base of the skull through the vertex without intravenous contrast. COMPARISON:  Head CT dated 02/23/2014 FINDINGS: Brain: The ventricles and sulci appropriate size for patient's age. Mild periventricular and deep white matter chronic microvascular ischemic changes noted. There is no acute intracranial hemorrhage. No mass effect or midline shift. No extra-axial fluid collection. Vascular: No hyperdense vessel or unexpected calcification. Skull: Normal. Negative for fracture or focal lesion. Sinuses/Orbits: No acute finding. Other: None IMPRESSION: 1. No acute intracranial hemorrhage. 2. Mild chronic microvascular ischemic changes. If symptoms persist, and there are no contraindications, MRI may provide better evaluation if clinically indicated. Electronically Signed   By: Anner Crete M.D.   On: 07/27/2018 01:35    EKG: Independently reviewed.  Normal sinus rhythm with PVCs.  Assessment/Plan Principal Problem:   Chest pain Active Problems:   Peripheral vascular disease, unspecified ([place])   Nonischemic cardiomyopathy ([place])    ICD (implantable cardioverter-defibrillator) in place   Essential hypertension   Hyperglycemia   Type 2 diabetes mellitus with vascular disease ([place])    1. Chest pain -mostly on lying down.  EKG does not show any definite signs of  pericarditis.  Patient is afebrile.  We will cycle cardiac markers check sed rate check 2D echo check d-dimer. 2. Ataxia -discussed with on-call neurologist Dr. Cheral Marker.  Since patient has AICD may need to see if this is compatible with MRI brain.  Since symptoms have been present for almost a week it is less likely to be stroke.  Will check Trileptal levels and thiamine levels.  Get physical therapy consult.  Check orthostatics. 3. Nonischemic cardiomyopathy status post AICD placement on spironolactone torsemide digoxin.  Closely follow intake output metabolic panel. 4. Chronic kidney disease stage III creatinine appears to be at baseline little bit better than before. 5. Diabetes mellitus type 2 uncontrolled with hyperglycemia -follow metabolic panel to make sure there is no development of anion gap acidosis.  Patient usually takes ramipril for now and keep patient on sliding scale coverage.  May need long-acting insulin if patient's blood sugar remains high. 6. History of gout on allopurinol. 7. Hypertension -continue present medication. 8. On Trileptal and Lyrica likely from neuropathy. 9. Leg pain likely from neuropathy.  Will check ABI.  10. COPD not actively wheezing. 11. Macrocytosis if need further work-up as outpatient will check B12 and folate levels. 12. Recently diagnosed UTI on Macrodantin for now we will keep patient on ceftriaxone for urine cultures. 13. History of peripheral vascular disease.   DVT prophylaxis: SCDs for now until we get 2D echo to rule out pericarditis. Code Status: Full code. Family Communication: Discussed with patient. Disposition Plan: Home. Consults called: Discussed with neurologist. Admission status: Observation.   Rise Patience MD [facility] Pager [phone].  If 7PM-7AM, please contact night-coverage [email or web address] Password [email or web address]  07/27/2018, 6:51 AM

## 2018-07-27 NOTE — Progress Notes (Signed)
Pt's cardiologist told pt not to take Torsemide or Losartan until Monday, per pt, please address, thanks [place].

## 2018-07-28 ENCOUNTER — Encounter (HOSPITAL_COMMUNITY): Payer: Medicare Other

## 2018-07-28 DIAGNOSIS — R0789 Other chest pain: Secondary | ICD-10-CM | POA: Diagnosis not present

## 2018-07-28 LAB — TROPONIN I: Troponin I: 0.06 ng/mL

## 2018-07-28 LAB — GLUCOSE, CAPILLARY
Glucose-Capillary: 189 mg/dL — ABNORMAL HIGH (ref 70–99)
Glucose-Capillary: 213 mg/dL — ABNORMAL HIGH (ref 70–99)

## 2018-07-28 NOTE — Discharge Summary (Signed)
Physician Discharge Summary  Lisa [name] DZH:[id number] DOB: Mar 30, 1946 DOA: 07/26/2018  PCP: [name], MD  Admit date: 07/26/2018 Discharge date: 07/28/2018  Admitted From: *Home Disposition:  *Home  Recommendations for Outpatient Follow-up:  1. Follow up with PCP in 1-2 weeks 2. Please obtain BMP/CBC in one week 3. Please follow up on the following pending results:  Home Health: Home health physical therapy Equipment/Devices: None  Discharge Condition:*Stable CODE STATUS: Full code Diet recommendation: Heart healthy carbohydrate controlled  Brief/Interim Summary:  Lisa [name] is a 72 y.o. female with history of nonischemic cardiomyopathy status post AICD placement, hypertension, chronic kidney disease, COPD, diabetes mellitus type 2, neuropathy presents to the ER because of chest pain.  Patient states over the last 1 week patient has been having chest pain mostly on lying down.  Last for few minutes and resolves.  Denies any shortness of breath electric cough fever chills.  At the same time patient also has been having ataxia for last 1 week.  Denies any fall or any weakness of the upper or lower extremities.  When patient tries to walk patient is having balance issues.  No change in medications recently.  She has recently had right and left carotid studies done and they were consistent with a 40 to 59% stenosis on the right and a 60 to 79% stenosis on the left she is followed by Dr. review of patient's medical records reveals a abnormal ABIs done on the lower extremities weak recently with poor toe pressures.  Patient is also followed closely by Dr. Oneida Alar for her carotid vascular disease and known vertebral insufficiency.  He had carotid Dopplers done on October 11. Since blood pressure began running fairly low patient has known carotid and vertebral disease, spironolactone was discontinued.  Losartan which had been started in the hospital was also discontinued.  Her Coreg  dose was also decreased as we are concerned that her dizziness was related to hypotension.  She is to follow-up with Dr. Marigene Ehlers next week for blood work and I am going to ask them to check her blood pressure as well.  She will also see her primary care doctor in the next 3 days.  Close attention to her blood pressure will be necessary since these medication changes have been made Patient has reached maximal benefit of hospitalization.  Discharge diagnosis, prognosis, plans, follow-up, medications and treatments discussed with the patient(or responsible party) and is in agreement with the plans as described.  Patient is stable for discharge.  Discharge Diagnoses:  Principal Problem:   Chest pain Active Problems:   Peripheral vascular disease, unspecified ([place])   Nonischemic cardiomyopathy ([place])    ICD (implantable cardioverter-defibrillator) in place   Essential hypertension   Hyperglycemia   Type 2 diabetes mellitus with vascular disease [facility])    Discharge Instructions  Discharge Instructions    Diet - low sodium heart healthy   Complete by:  As directed    Discharge instructions   Complete by:  As directed    Make and keep follow up appointment with you doctor for further valuation and treatment   Increase activity slowly   Complete by:  As directed      Allergies as of 07/28/2018      Reactions   Oxycodone Shortness Of Breath   Entresto [sacubitril-valsartan] Other (See Comments)   Impaired coordination---causes patient to drop many things, blood pressure bottomed out      Medication List    TAKE these medications  albuterol (2.5 MG/3ML) 0.083% nebulizer solution Commonly known as:  PROVENTIL Take 3 mLs by nebulization every 4 (four) hours as needed for wheezing.   albuterol 108 (90 Base) MCG/ACT inhaler Commonly known as:  PROVENTIL HFA;VENTOLIN HFA Inhale 2 puffs into the lungs every 6 (six) hours as needed for wheezing or shortness of breath.   allopurinol 100 MG  tablet Commonly known as:  ZYLOPRIM Take 100 mg by mouth daily.   aspirin EC 81 MG tablet Take 162 mg by mouth daily.   atorvastatin 20 MG tablet Commonly known as:  LIPITOR TAKE 1 TABLET BY MOUTH DAILY What changed:  when to take this   BREO ELLIPTA 200-25 MCG/INH Aepb Generic drug:  fluticasone furoate-vilanterol Inhale 1 puff into the lungs daily.   carvedilol 12.5 MG tablet Commonly known as:  COREG TAKE ONE (1) TABLET BY MOUTH TWO (2) TIMES DAILY What changed:  See the new instructions.   digoxin 0.125 MG tablet Commonly known as:  LANOXIN Take 0.5 tablets (0.0625 mg total) by mouth daily.   glimepiride 2 MG tablet Commonly known as:  AMARYL Take 2 mg by mouth daily.   KLOR-CON M20 20 MEQ tablet Generic drug:  potassium chloride SA TAKE 1 TABLET BY MOUTH EVERY DAY What changed:  how much to take   losartan 25 MG tablet Commonly known as:  COZAAR Take 25 mg by mouth 2 (two) times daily.   nitrofurantoin (macrocrystal-monohydrate) 100 MG capsule Commonly known as:  MACROBID Take 100 mg by mouth 2 (two) times daily.   OXcarbazepine 150 MG tablet Commonly known as:  TRILEPTAL Take 150 mg by mouth 3 (three) times daily.   pregabalin 150 MG capsule Commonly known as:  LYRICA Take 150 mg by mouth 2 (two) times daily.   spironolactone 25 MG tablet Commonly known as:  ALDACTONE Take 1 tablet (25 mg total) by mouth daily.   torsemide 20 MG tablet Commonly known as:  DEMADEX Take 40-80 mg by mouth See admin instructions. Take 4 tablets (80 mg) daily and 2 tablets (40 mg) every other night      Follow-up Information    [name], MD. Schedule an appointment as soon as possible for a visit in 3 day(s).   Specialty:  Internal Medicine Why:  follow up of dizziness (?BPP[facility]) Contact information: [address] [place] [address] [phone]        Constance Haw, MD .   Specialty:  Cardiology Contact information: [place] [address] [phone]        Larey Dresser, MD .   Specialty:  Cardiology Contact information: [address] [place] [address] [phone]          Allergies  Allergen Reactions  . Oxycodone Shortness Of Breath  . Entresto [Sacubitril-Valsartan] Other (See Comments)    Impaired coordination---causes patient to drop many things, blood pressure bottomed out    Consultations:  Cardiology Dr. Debara Pickett   Procedures/Studies: Dg Chest 2 View  Result Date: 07/26/2018 CLINICAL DATA:  72 year old female with chest pain. EXAM: CHEST - 2 VIEW COMPARISON:  Chest radiograph dated 03/27/2018 FINDINGS: Mild diffuse interstitial coarsening. No focal consolidation, pleural effusion, or pneumothorax. Stable cardiac silhouette. Left pectoral AICD device. No acute osseous pathology. Atherosclerotic calcification of the aorta. IMPRESSION: No active cardiopulmonary disease. Electronically Signed   By: Anner Crete M.D.   On: 07/26/2018 22:09   Ct Head Wo Contrast  Result Date: 07/27/2018 CLINICAL DATA:  72 year old female with ataxia and confusion. EXAM: CT HEAD WITHOUT CONTRAST TECHNIQUE: Contiguous axial images were obtained from the base of the skull through the vertex without intravenous contrast. COMPARISON:  Head CT dated 02/23/2014 FINDINGS: Brain: The ventricles and sulci appropriate size for patient's age. Mild periventricular and deep white matter chronic microvascular ischemic changes noted. There is no acute intracranial hemorrhage. No mass effect or midline shift. No extra-axial fluid collection. Vascular: No hyperdense vessel or unexpected calcification. Skull: Normal. Negative for fracture or focal lesion. Sinuses/Orbits: No acute finding. Other: None IMPRESSION: 1. No acute intracranial hemorrhage. 2. Mild chronic microvascular ischemic changes. If symptoms persist, and there are no contraindications, MRI may provide better evaluation if clinically indicated.  Electronically Signed   By: Anner Crete M.D.   On: 07/27/2018 01:35   Vas [place] Burnard Bunting With/wo Tbi  Result Date: 07/05/2018 LOWER EXTREMITY DOPPLER STUDY Indications: Peripheral artery disease. High Risk         Hypertension, hyperlipidemia, Diabetes, past history of Factors:          smoking.  Vascular Interventions: Left to right femoral to femoral bypass graft and                         femoral endarterectomy 12/21/2014. Comparison Study: Increased ABIs bilaterally Performing Technologist: Alvia Grove RVT  Examination Guidelines: A complete evaluation includes at minimum, Doppler waveform signals and systolic blood pressure reading at the level of bilateral brachial, anterior tibial, and posterior tibial arteries, when vessel segments are accessible. Bilateral testing is considered an integral part of a complete examination. Photoelectric Plethysmograph (PPG) waveforms and toe systolic pressure readings are included as required and additional duplex testing as needed. Limited examinations for reoccurring indications may be performed as noted.  ABI Findings: +---------+------------------+-----+--------+--------+ Right    Rt Pressure (mmHg)IndexWaveformComment  +---------+------------------+-----+--------+--------+ Brachial 111                                     +---------+------------------+-----+--------+--------+ PTA      96                0.86 biphasic         +---------+------------------+-----+--------+--------+ DP       100               0.90 biphasic         +---------+------------------+-----+--------+--------+ Great Toe77                0.69 Abnormal         +---------+------------------+-----+--------+--------+ +---------+------------------+-----+--------+-------+ Left     Lt Pressure (mmHg)IndexWaveformComment +---------+------------------+-----+--------+-------+ Brachial 84                                      +---------+------------------+-----+--------+-------+ PTA      94                0.85 biphasic        +---------+------------------+-----+--------+-------+ DP       93                0.84 biphasic        +---------+------------------+-----+--------+-------+ Great Toe60                0.54 Abnormal        +---------+------------------+-----+--------+-------+ +-------+-----------+-----------+------------+------------+ ABI/TBIToday's ABIToday's TBIPrevious ABIPrevious TBI +-------+-----------+-----------+------------+------------+  Right  0.90       0.69       0.68        0.52         +-------+-----------+-----------+------------+------------+ Left   0.85       0.54       0.66        0.54         +-------+-----------+-----------+------------+------------+  Summary: Right: Resting right ankle-brachial index indicates mild right lower extremity arterial disease. The right toe-brachial index is abnormal. Left: Resting left ankle-brachial index indicates mild left lower extremity arterial disease. The left toe-brachial index is abnormal.  *See table(s) above for measurements and observations.  Electronically signed by Servando Snare MD on 07/05/2018 at 11:55:22 AM.    Final    Vas US Carotid  Result Date: 07/05/2018 Carotid Arterial Duplex Study Risk Factors:      Hypertension, hyperlipidemia. Comparison Study:  No significant change since exam of 01/04/2018 Performing Technologist: Alvia Grove RVT  Examination Guidelines: A complete evaluation includes B-mode imaging, spectral Doppler, color Doppler, and power Doppler as needed of all accessible portions of each vessel. Bilateral testing is considered an integral part of a complete examination. Limited examinations for reoccurring indications may be performed as noted.  Right Carotid Findings: +----------+--------+--------+--------+-----------+--------+           PSV cm/sEDV cm/sStenosisDescribe   Comments  +----------+--------+--------+--------+-----------+--------+ CCA Prox  100     16                                  +----------+--------+--------+--------+-----------+--------+ CCA Mid   96      16                                  +----------+--------+--------+--------+-----------+--------+ CCA Distal118     31              calcific            +----------+--------+--------+--------+-----------+--------+ ICA Prox  182     48      40-59%  calcific            +----------+--------+--------+--------+-----------+--------+ ICA Mid   104     20              homogeneous         +----------+--------+--------+--------+-----------+--------+ ICA Distal70      23                                  +----------+--------+--------+--------+-----------+--------+ ECA       99      18                                  +----------+--------+--------+--------+-----------+--------+ +----------+--------+-------+----------------+-------------------+           PSV cm/sEDV cmsDescribe        Arm Pressure (mmHG) +----------+--------+-------+----------------+-------------------+ [id number]     6      Multiphasic, WNL                    +----------+--------+-------+----------------+-------------------+ +---------+--------+---+--------+--+---------+ VertebralPSV cm/s107EDV cm/s20Antegrade +---------+--------+---+--------+--+---------+  Left Carotid Findings: +----------+--------+--------+--------+------------+--------+           PSV cm/sEDV cm/sStenosisDescribe    Comments +----------+--------+--------+--------+------------+--------+ CCA Prox  101     17                                   +----------+--------+--------+--------+------------+--------+ CCA Mid   126     27              heterogenous         +----------+--------+--------+--------+------------+--------+ CCA Distal100     23              heterogenous          +----------+--------+--------+--------+------------+--------+ ICA Prox  273     74              heterogenous         +----------+--------+--------+--------+------------+--------+ ICA Mid   154     35      60-79%                       +----------+--------+--------+--------+------------+--------+ ICA Distal77      22                                   +----------+--------+--------+--------+------------+--------+ ECA       123     23              heterogenous         +----------+--------+--------+--------+------------+--------+ +----------+--------+--------+----------+-------------------+ SubclavianPSV cm/sEDV cm/sDescribe  Arm Pressure (mmHG) +----------+--------+--------+----------+-------------------+           86      5       monophasic                    +----------+--------+--------+----------+-------------------+ +---------+--------+--------+----------+ VertebralPSV cm/sEDV cm/sRetrograde +---------+--------+--------+----------+  Summary: Right Carotid: Velocities in the right ICA are consistent with a 40-59%                stenosis. Left Carotid: Velocities in the left ICA are consistent with a 60-79% stenosis.  *See table(s) above for measurements and observations.  Electronically signed by Servando Snare MD on 07/05/2018 at 11:48:41 AM.    Final     Echocardiogram: - Compared to a prior study in 10/2008, the LVEF is higher at   30-35%.   Subjective: Patient with no new complaints.  Extensive discussion with the patient's husband and daughters reveals that she has had a long history of dizziness (probably related to vertebrobasilar insufficiency followed by vascular surgery)  Discharge Exam: Vitals:   07/28/18 0444 07/28/18 1116  BP: (!) 141/73   Pulse: 79 (!) 59  Resp: 18   Temp: 97.7 F (36.5 C) 97.9 F (36.6 C)  SpO2: 95% 93%   Vitals:   07/27/18 2021 07/27/18 2313 07/28/18 0444 07/28/18 1116  BP: (!) 139/52 (!) 133/44 (!) 141/73   Pulse: 72  81 79 (!) 59  Resp: 16 18 18    Temp: (!) 97.4 F (36.3 C) 97.9 F (36.6 C) 97.7 F (36.5 C) 97.9 F (36.6 C)  TempSrc: Oral Oral Oral Oral  SpO2: 95% 96% 95% 93%  Weight:   87.8 kg   Height:        General: Pt is alert, awake, not in acute distress Cardiovascular: RRR, S1/S2 +, no rubs, no gallops Respiratory: CTA bilaterally, no wheezing, no rhonchi Abdominal: Soft, NT, ND, bowel sounds + Extremities: no edema, no cyanosis    The results of  significant diagnostics from this hospitalization (including imaging, microbiology, ancillary and laboratory) are listed below for reference.     Microbiology: Recent Results (from the past 240 hour(s))  Culture, Urine     Status: Abnormal (Preliminary result)   Collection Time: 07/27/18  2:50 AM  Result Value Ref Range Status   Specimen Description URINE, RANDOM  Final   Special Requests   Final    NONE Performed at [facility], [address], [place], [place] [address]    Culture 40,000 COLONIES/mL GRAM NEGATIVE RODS (A)  Final   Report Status PENDING  Incomplete     Labs: BNP (last 3 results) Recent Labs    09/26/17 1033 03/18/18 1341 07/27/18 0151  BNP 492.8* 415.9* 937.1*   Basic Metabolic Panel: Recent Labs  Lab 07/23/18 0957 07/26/18 2155 07/27/18 0945  NA 136 136 139  K 5.3* 4.3 4.2  CL 99 95* 97*  CO2 24 30 27   GLUCOSE 211* 402* 259*  BUN 61* 37* 40*  CREATININE 1.90* 1.27* 1.18*  CALCIUM 9.5 9.8 9.7  MG  --   --  1.9   Liver Function Tests: Recent Labs  Lab 07/27/18 0945  AST 43*  ALT 33  ALKPHOS 95  BILITOT 0.7  PROT 7.5  ALBUMIN 4.0   No results for input(s): LIPASE, AMYLASE in the last 168 hours. No results for input(s): AMMONIA in the last 168 hours. CBC: Recent Labs  Lab 07/26/18 2155  WBC 5.0  HGB 13.0  HCT 42.4  MCV 101.7*  PLT 232   Cardiac Enzymes: Recent Labs  Lab 07/27/18 0945 07/27/18 1654 07/27/18 2317  CKTOTAL 145  --   --   TROPONINI 0.06* 0.06* 0.06*    BNP: Invalid input(s): POCBNP CBG: Recent Labs  Lab 07/27/18 0850 07/27/18 1135 07/27/18 1629 07/27/18 2216 07/28/18 0752  GLUCAP 195* 256* 216* 264* 189*   D-Dimer Recent Labs    07/27/18 1416  DDIMER 0.68*   Hgb A1c No results for input(s): HGBA1C in the last 72 hours. Lipid Profile No results for input(s): CHOL, HDL, LDLCALC, TRIG, CHOLHDL, LDLDIRECT in the last 72 hours. Thyroid function studies Recent Labs    07/27/18 0707  TSH 10.099*   Anemia work up Recent Labs    07/27/18 0945  VITAMINB12 234   Urinalysis    Component Value Date/Time   COLORURINE YELLOW 07/27/2018 [place] 07/27/2018 0250   LABSPEC 1.011 07/27/2018 0250   PHURINE 5.0 07/27/2018 0250   GLUCOSEU 150 (A) 07/27/2018 0250   HGBUR NEGATIVE 07/27/2018 0250   BILIRUBINUR NEGATIVE 07/27/2018 0250   KETONESUR NEGATIVE 07/27/2018 0250   PROTEINUR NEGATIVE 07/27/2018 0250   UROBILINOGEN 0.2 12/11/2014 1131   NITRITE NEGATIVE 07/27/2018 0250   LEUKOCYTESUR TRACE (A) 07/27/2018 0250   Sepsis Labs Invalid input(s): PROCALCITONIN,  WBC,  LACTICIDVEN Microbiology Recent Results (from the past 240 hour(s))  Culture, Urine     Status: Abnormal (Preliminary result)   Collection Time: 07/27/18  2:50 AM  Result Value Ref Range Status   Specimen Description URINE, RANDOM  Final   Special Requests   Final    NONE Performed at [facility], [place] [address] [place], [place] [address]    Culture 40,000 COLONIES/mL GRAM NEGATIVE RODS (A)  Final   Report Status PENDING  Incomplete     Time coordinating discharge: 45 minutes  SIGNED:   Lady Deutscher, MD  FACP [facility] 07/28/2018, 12:18 PM Pager  If 7PM-7AM, please contact night-coverage [email or web address] Password [email or web address]

## 2018-07-28 NOTE — Progress Notes (Signed)
Patient is ready for transport when ride  Gets here.

## 2018-07-28 NOTE — Progress Notes (Signed)
Patient has positive ortho dizzy and ataxic with therapist, MD aware and will review for advice.

## 2018-07-28 NOTE — Evaluation (Signed)
Physical Therapy Evaluation Patient Details Name: Lisa [name] MRN: [id number] DOB: 1946/06/16 Today's Date: 07/28/2018   History of Present Illness   Patient is a 72 year old female with history of CHF status post pacemaker/defibrillator, hypertension, diabetes hyperlipidemia, coronary artery disease who presents to the emergency department with complaints of intermittent chest pain for the past week     Clinical Impression  Pt admitted with above diagnosis. Pt currently with functional limitations due to the deficits listed below (see PT Problem List). PTA pt at home with family members, mod I with SPC around the home. Patient denies true spinning, unable to provoke nystagmus, negative sideling tests and central/peripheral vertigo screening. Patient with 37' of normal gait mechanics with LOB, inconsistent with quality of gait. Discussed with patient importance of having family member guard and supervise mobility, she states she has a RW and will have help at home. rec HHPT for further work with balance.   Pt will benefit from skilled PT to increase their independence and safety with mobility to allow discharge to the venue listed below.       Follow Up Recommendations Home health PT(HHPT for Balance and Vestibular)    Equipment Recommendations     Recommendations for Other Services       Precautions / Restrictions Restrictions Weight Bearing Restrictions: No      Mobility  Bed Mobility                  Transfers Overall transfer level: Needs assistance Equipment used: Rolling walker (2 wheeled) Transfers: Sit to/from Stand Sit to Stand: Min guard            Ambulation/Gait Ambulation/Gait assistance: Min guard;Min assist Gait Distance (Feet): 100 Feet Assistive device: Rolling walker (2 wheeled) Gait Pattern/deviations: Step-to pattern;Step-through pattern Gait velocity: decreased   General Gait Details: patient ambulating with normal mechanics then  having episodes of LOB requring min A to Sales executive    Modified Rankin (Stroke Patients Only)       Balance Overall balance assessment: Needs assistance   Sitting balance-Leahy Scale: Good       Standing balance-Leahy Scale: Fair                               Pertinent Vitals/Pain Pain Assessment: No/denies pain    Home Living Family/patient expects to be discharged to:: Private residence Living Arrangements: Spouse/significant other Available Help at Discharge: Family;Available 24 hours/day Type of Home: House Home Access: Level entry     Home Layout: Two level;Able to live on main level with bedroom/bathroom Home Equipment: Kasandra Knudsen - single point Additional Comments: lives with husband niece nephew and grandson     Prior Function Level of Independence: Independent         Comments: mod i with walking      Hand Dominance        Extremity/Trunk Assessment   Upper Extremity Assessment Upper Extremity Assessment: Overall WFL for tasks assessed    Lower Extremity Assessment Lower Extremity Assessment: Overall WFL for tasks assessed       Communication   Communication: No difficulties  Cognition Arousal/Alertness: Awake/alert  General Comments      Exercises     Assessment/Plan    PT Assessment Patient needs continued PT services  PT Problem List Decreased activity tolerance       PT Treatment Interventions DME instruction;Gait training;Stair training;Functional mobility training;Therapeutic activities;Therapeutic exercise;Balance training    PT Goals (Current goals can be found in the Care Plan section)  Acute Rehab PT Goals Patient Stated Goal: go home PT Goal Formulation: With patient Time For Goal Achievement: 08/11/18 Potential to Achieve Goals: Good    Frequency Min 3X/week   Barriers to discharge         Co-evaluation               AM-PAC PT "6 Clicks" Daily Activity  Outcome Measure Difficulty turning over in bed (including adjusting bedclothes, sheets and blankets)?: A Little Difficulty moving from lying on back to sitting on the side of the bed? : A Little Difficulty sitting down on and standing up from a chair with arms (e.g., wheelchair, bedside commode, etc,.)?: A Little Help needed moving to and from a bed to chair (including a wheelchair)?: A Little Help needed walking in hospital room?: A Lot Help needed climbing 3-5 steps with a railing? : A Lot 6 Click Score: 16    End of Session Equipment Utilized During Treatment: Gait belt Activity Tolerance: Patient tolerated treatment well Patient left: in bed;with call bell/phone within reach Nurse Communication: Mobility status PT Visit Diagnosis: Unsteadiness on feet (R26.81)    Time: 3143-8887 PT Time Calculation (min) (ACUTE ONLY): 32 min   Charges:   PT Evaluation $PT Eval Moderate Complexity: 1 Mod PT Treatments $Gait Training: 8-22 mins        ;Reinaldo Berber, PT, DPT Acute Rehabilitation Services Pager: [phone] Office: [place] 07/28/2018, 2:42 PM

## 2018-07-29 ENCOUNTER — Other Ambulatory Visit: Payer: Self-pay

## 2018-07-29 ENCOUNTER — Encounter (HOSPITAL_COMMUNITY): Payer: Self-pay

## 2018-07-29 ENCOUNTER — Ambulatory Visit (HOSPITAL_COMMUNITY)
Admission: RE | Admit: 2018-07-29 | Discharge: 2018-07-29 | Disposition: A | Discharge status: Home / Self Care | Payer: Medicare Other | Source: Ambulatory Visit | Attending: Cardiology | Admitting: Cardiology

## 2018-07-29 VITALS — BP 120/70 | HR 64 | Wt 192.8 lb

## 2018-07-29 DIAGNOSIS — E1142 Type 2 diabetes mellitus with diabetic polyneuropathy: Secondary | ICD-10-CM | POA: Insufficient documentation

## 2018-07-29 DIAGNOSIS — E854 Organ-limited amyloidosis: Secondary | ICD-10-CM | POA: Diagnosis not present

## 2018-07-29 DIAGNOSIS — Z8249 Family history of ischemic heart disease and other diseases of the circulatory system: Secondary | ICD-10-CM | POA: Insufficient documentation

## 2018-07-29 DIAGNOSIS — I429 Cardiomyopathy, unspecified: Secondary | ICD-10-CM | POA: Diagnosis not present

## 2018-07-29 DIAGNOSIS — Z7984 Long term (current) use of oral hypoglycemic drugs: Secondary | ICD-10-CM | POA: Diagnosis not present

## 2018-07-29 DIAGNOSIS — E859 Amyloidosis, unspecified: Secondary | ICD-10-CM | POA: Diagnosis not present

## 2018-07-29 DIAGNOSIS — Z79899 Other long term (current) drug therapy: Secondary | ICD-10-CM | POA: Diagnosis not present

## 2018-07-29 DIAGNOSIS — E785 Hyperlipidemia, unspecified: Secondary | ICD-10-CM | POA: Diagnosis not present

## 2018-07-29 DIAGNOSIS — E8589 Other amyloidosis: Secondary | ICD-10-CM | POA: Diagnosis not present

## 2018-07-29 DIAGNOSIS — J449 Chronic obstructive pulmonary disease, unspecified: Secondary | ICD-10-CM | POA: Diagnosis not present

## 2018-07-29 DIAGNOSIS — I779 Disorder of arteries and arterioles, unspecified: Secondary | ICD-10-CM

## 2018-07-29 DIAGNOSIS — M1990 Unspecified osteoarthritis, unspecified site: Secondary | ICD-10-CM | POA: Diagnosis not present

## 2018-07-29 DIAGNOSIS — Z95810 Presence of automatic (implantable) cardiac defibrillator: Secondary | ICD-10-CM | POA: Insufficient documentation

## 2018-07-29 DIAGNOSIS — R2681 Unsteadiness on feet: Secondary | ICD-10-CM | POA: Diagnosis not present

## 2018-07-29 DIAGNOSIS — I739 Peripheral vascular disease, unspecified: Secondary | ICD-10-CM | POA: Insufficient documentation

## 2018-07-29 DIAGNOSIS — I5022 Chronic systolic (congestive) heart failure: Secondary | ICD-10-CM | POA: Diagnosis not present

## 2018-07-29 DIAGNOSIS — Z7982 Long term (current) use of aspirin: Secondary | ICD-10-CM | POA: Insufficient documentation

## 2018-07-29 DIAGNOSIS — I6523 Occlusion and stenosis of bilateral carotid arteries: Secondary | ICD-10-CM | POA: Insufficient documentation

## 2018-07-29 DIAGNOSIS — I43 Cardiomyopathy in diseases classified elsewhere: Secondary | ICD-10-CM

## 2018-07-29 DIAGNOSIS — I428 Other cardiomyopathies: Secondary | ICD-10-CM | POA: Diagnosis not present

## 2018-07-29 LAB — FOLATE RBC
FOLATE, RBC: 1095 ng/mL (ref >498)
Folate, Hemolysate: 406.1 ng/mL
Hematocrit: 37.1 % (ref 34.0–46.6)

## 2018-07-29 LAB — URINE CULTURE: Culture: 40000 — AB

## 2018-07-29 MED ORDER — TORSEMIDE 20 MG PO TABS: 40.0000 mg | ORAL_TABLET | Freq: Every day | ORAL | 3 refills | Status: DC

## 2018-07-29 MED ORDER — LOSARTAN POTASSIUM 25 MG PO TABS: 12.5000 mg | ORAL_TABLET | Freq: Every day | ORAL | 3 refills | Status: DC

## 2018-07-29 NOTE — Patient Instructions (Signed)
RESTART Losartan 12.5mg  (1/2 tab) every night  RESTART Torsemide 40mg  daily (2 tabs) daily   Your physician recommends that you schedule a follow-up appointment in: 2 weeks in APP clinic with labs to be drawn at visit.  CALL office if you are experiencing any of the following symptoms: weight gain, weight loss, dizziness, or increasing shortness of breath

## 2018-07-30 ENCOUNTER — Other Ambulatory Visit (HOSPITAL_COMMUNITY): Payer: Self-pay | Admitting: Cardiology

## 2018-07-30 LAB — 10-HYDROXYCARBAZEPINE: TRILIPTAL/MTB(OXCARBAZEPIN): 13 ug/mL (ref 10–35)

## 2018-07-30 LAB — VITAMIN B1: Vitamin B1 (Thiamine): 112.8 nmol/L (ref 66.5–200.0)

## 2018-08-13 ENCOUNTER — Ambulatory Visit (HOSPITAL_COMMUNITY)
Admission: RE | Admit: 2018-08-13 | Discharge: 2018-08-13 | Disposition: A | Discharge status: Home / Self Care | Payer: Medicare Other | Source: Ambulatory Visit | Attending: Cardiology | Admitting: Cardiology

## 2018-08-13 ENCOUNTER — Encounter (HOSPITAL_COMMUNITY): Payer: Self-pay

## 2018-08-13 VITALS — BP 160/80 | HR 66 | Wt 196.0 lb

## 2018-08-13 DIAGNOSIS — I5022 Chronic systolic (congestive) heart failure: Secondary | ICD-10-CM | POA: Diagnosis not present

## 2018-08-13 DIAGNOSIS — Z7984 Long term (current) use of oral hypoglycemic drugs: Secondary | ICD-10-CM | POA: Insufficient documentation

## 2018-08-13 DIAGNOSIS — Z7982 Long term (current) use of aspirin: Secondary | ICD-10-CM | POA: Insufficient documentation

## 2018-08-13 DIAGNOSIS — I428 Other cardiomyopathies: Secondary | ICD-10-CM | POA: Insufficient documentation

## 2018-08-13 DIAGNOSIS — E854 Organ-limited amyloidosis: Secondary | ICD-10-CM

## 2018-08-13 DIAGNOSIS — Z8249 Family history of ischemic heart disease and other diseases of the circulatory system: Secondary | ICD-10-CM | POA: Insufficient documentation

## 2018-08-13 DIAGNOSIS — I739 Peripheral vascular disease, unspecified: Secondary | ICD-10-CM | POA: Diagnosis not present

## 2018-08-13 DIAGNOSIS — J449 Chronic obstructive pulmonary disease, unspecified: Secondary | ICD-10-CM | POA: Insufficient documentation

## 2018-08-13 DIAGNOSIS — I6529 Occlusion and stenosis of unspecified carotid artery: Secondary | ICD-10-CM | POA: Diagnosis not present

## 2018-08-13 DIAGNOSIS — I429 Cardiomyopathy, unspecified: Secondary | ICD-10-CM

## 2018-08-13 DIAGNOSIS — R2681 Unsteadiness on feet: Secondary | ICD-10-CM | POA: Diagnosis not present

## 2018-08-13 DIAGNOSIS — Z87891 Personal history of nicotine dependence: Secondary | ICD-10-CM | POA: Diagnosis not present

## 2018-08-13 DIAGNOSIS — E119 Type 2 diabetes mellitus without complications: Secondary | ICD-10-CM | POA: Diagnosis not present

## 2018-08-13 DIAGNOSIS — Z79899 Other long term (current) drug therapy: Secondary | ICD-10-CM | POA: Diagnosis not present

## 2018-08-13 DIAGNOSIS — E8589 Other amyloidosis: Secondary | ICD-10-CM | POA: Diagnosis not present

## 2018-08-13 DIAGNOSIS — I43 Cardiomyopathy in diseases classified elsewhere: Secondary | ICD-10-CM

## 2018-08-13 DIAGNOSIS — E785 Hyperlipidemia, unspecified: Secondary | ICD-10-CM | POA: Diagnosis not present

## 2018-08-13 DIAGNOSIS — Z79890 Hormone replacement therapy (postmenopausal): Secondary | ICD-10-CM | POA: Insufficient documentation

## 2018-08-13 MED ORDER — LOSARTAN POTASSIUM 25 MG PO TABS: 25.0000 mg | ORAL_TABLET | Freq: Every day | ORAL | 3 refills | Status: DC

## 2018-08-13 NOTE — Patient Instructions (Signed)
INCREASE Losartan to 25mg  daily.

## 2018-08-13 NOTE — Addendum Note (Signed)
Encounter addended by: Shirley Friar, PA-C on: 08/13/2018 10:47 AM  Actions taken: LOS modified

## 2018-08-13 NOTE — Progress Notes (Signed)
Advanced Heart Failure Clinic Note  PCP: Dr. Loistine Simas Cardiology: Dr. Geraldo Pitter HF Cardiology: Dr. Abram Sander is a 72 y.o. female with history of PAD, COPD, carotid stenosis, and chronic systolic CHF was referred by Dr. Geraldo Pitter for evaluation/treatment of CHF.    Patient has a long history of vascular disease.  She had left-right fem-fem bypass in 3/16. She has moderate bilateral carotid stenosis.  Echo in 2/18 showed EF 20-25%.  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  She was admitted in 9/18 to the hospital in [facility] for about 4 days with acute on chronic systolic CHF.    RHC/LHC in 12/18 showed nonobstructive coronary disease, low filling pressures, and CI 2.0.   Admitted 09/20/17 with volume overload. Diuresed with IV lasix. Weight went down 2 pounds. Discharged on torsemide 40 mg daily. Discharge weight 179 pounds.   Echo in 12/18 with EF 20-25%, mild basal ventricular hypertrophy, severe mid to apical LV hypertrophy.  Repeat echo in 2/19 was done with Definity contrast, showing that what was thought to be apical hypertrophy was actually prominent mid-apical trabeculation suggestive of noncompaction.   She was noted to have Bence-Jones protein on UPEP.  However, SPEP was negative and 24 hours UPEP was also negative. Abdominal fat pad biopsy did not show evidence of amyloidosis.  She saw heme-onc, probably does not have AL amyloidosis.  However, in 2/19, she had TcPYP scan that was strongly suggestive of transthyretin amyloidosis. She has peripheral neuropathy that may be due to amyloidosis as well. 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   Admitted 6/24-6/26/19 for LLL PNA. She received IV abx and prednisone. Transitioned to omnicef 300 mg BID and azirthromycin 250 mg daily. BP medications were held, but restarted at discharge.    She was admitted 11/1-11/3/19 with dizziness. She also had intermittent CP and SOB with lying  flat x1 week. She had AKI with creatinine up to 1.9. She was also having ataxia and unsteady gait. The hospitalist discussed with neuro. Head CT negative. Losartan, spiro, and torsemide were stopped with concerns for hypotension. Repeat echo showed EF 30-35%. Troponins were flat. PT was consulted and recommended [facility] PT for vestibular exercises, but it doesn't look like this was set up. Of note, she was also being treated for UTI prior to admission.  She presents today for regular follow up. Last visit torsemide and losartan restarted. Feeling OK overall. Denies SOB or peripheral edema. She has still not started tafamidis. She states she has been waiting to decide if she was actually going to take it. Watching salt and fluid intake. Per her daughter, she takes a lot of medical advice from her friend, Freda Munro, and this may have contributed to her not starting tafamidis. HH checked her BP yesterday and she states it was "OK" but can't remember specific numbers. Denies lightheadedness or dizziness. Denies CP or orthopnea. She only took her synthroid this am.   Labs (8/18): LDL 80, HDL 46 Labs (10/18): K 4.2, creatinine 1.1 Labs (11/18): digoxin 0.3 Labs (12/18): K 3.5, creatinine 1.02, UPEP with faint Bence-Jones protein but 24 hour UPEP negative and SPEP negative.  Labs (1/19): LDL 79, HDL 38, BNP 493, hgb 13.6, K 5.8 => 4.8, creatinine 1.18, digoxin 0.3 Labs (2/19): K 3.3, creatinine 1.06 Labs (3/19): digoxin < 0.2 Labs (4/19): K 5, creatinine 0.97 Labs (6/19): K 3.5, creatinine 1.19 Labs (7/19): digoxin < 0.2, K 4.4, creatinine 1.14  PMH: 1. PAD: L=>R fem-fem bypass in 3/16.   -  ABIs (3/18): Mildly abnormal, 0.81 left and 0.85 right.  - ABIs (4/19): 0.68 left and 0.66 right 2. H/o appendectomy 3. COPD: Quit smoking in 2/12.  - High resolution CT chest (2/19): No interstitial lung disease, +emphysema.  4. Arthritis: Bilateral TKR 5. Diet-controlled diabetes 6. Hyperlipidemia 7. Carotid stenosis:   - 10/18 Carotid dopplers: 60-79% RICA stenosis, 28-31% LICA stenosis, left subclavian steal.  - 4/19 Carotid dopplers: 51-76% RICA, 16-07% LICA, left subclavian stenosis.  8. Chronic systolic CHF: Echo (3/71) with EF 20-25%, mild LV dilation, normal RV size and systolic function. 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 .  - LHC (10/17): Nonobstructive CAD.  - Biotronik ICD.  - LHC/RHC (12/18): 50% proximal PLOM (no obstructive disease). Mean RA 1, PA 32/10 mean 19, mean PCWP 7, CI 2.0.  - Echo (12/18): EF 20-25%, mild basal LV hypertrophy, severe mid to apical LV hypertrophy, moderate diastolic dysfunction, mild MR, PASP 42 mmHg.  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  - Workup appears negative for AL amyloidosis.  Negative abdominal fat pad biopsy.  However, PYP scan was suggestive of transthyretin amyloidosis.  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  10. Peripheral neuropathy: ?due to amyloidosis.  11. Gout 12. H/o Bell's palsy  SH: Lives with husband in [place], 3 daughters.  Quit smoking in 2012.    Family History  Problem Relation Age of Onset  . Heart disease Mother   . Hypertension Mother   Daughter with cardiomyopathy. Nephews with CAD.   Review of systems complete and found to be negative unless listed in HPI.    Current Outpatient Medications  Medication Sig Dispense Refill  . albuterol (PROVENTIL HFA;VENTOLIN HFA) 108 (90 Base) MCG/ACT inhaler Inhale 2 puffs into the lungs every 6 (six) hours as needed for wheezing or shortness of breath. 1 Inhaler 6  . albuterol (PROVENTIL) (2.5 MG/3ML) 0.083% nebulizer solution Take 3 mLs by nebulization every 4 (four) hours as needed for wheezing.    [name]  allopurinol (ZYLOPRIM) 100 MG tablet Take 100 mg by mouth daily.    [name] aspirin EC 81 MG tablet Take 162 mg by mouth daily.    [name] atorvastatin (LIPITOR) 20 MG tablet TAKE 1 TABLET BY MOUTH DAILY 30 tablet 4  . carvedilol (COREG) 12.5 MG tablet TAKE ONE (1) TABLET BY MOUTH TWO (2) TIMES DAILY 180 tablet 1  . digoxin (DIGOX) 0.125 MG tablet Take 0.5 tablets (0.0625 mg total) by mouth daily. 15 tablet 5  . fluticasone furoate-vilanterol (BREO ELLIPTA) 200-25 MCG/INH AEPB Inhale 1 puff into the lungs daily.    [name] glimepiride (AMARYL) 2 MG tablet Take 2 mg by mouth daily.    [name] levothyroxine (SYNTHROID, LEVOTHROID) 25 MCG tablet Take 25 mcg by mouth daily before breakfast.    . losartan (COZAAR) 25 MG tablet Take 0.5 tablets (12.5 mg total) by mouth at bedtime. 15 tablet 3  . nitrofurantoin, macrocrystal-monohydrate, (MACROBID) 100 MG capsule Take 100 mg by mouth 2 (two) times daily.    . OXcarbazepine (TRILEPTAL) 150 MG tablet Take 150 mg by mouth 3 (three) times daily.     . pregabalin (LYRICA) 150 MG capsule Take 150 mg by mouth 2 (two) times daily.    [name] torsemide (DEMADEX) 20 MG tablet Take 2 tablets (40 mg  total) by mouth daily. 60 tablet 3   No current facility-administered medications for this encounter.    Vitals:   08/13/18 0953  BP: (!) 160/80  Pulse: 66  SpO2: 93%  Weight: 88.9 kg (196 lb)    Wt Readings from Last 3 Encounters:  08/13/18 88.9 kg (196 lb)  07/29/18 87.5 kg (192 lb 12.8 oz)  07/28/18 87.8 kg (193 lb 9.6 oz)   General: Elderly appearing. No resp difficulty. HEENT: Normal Neck: Supple. JVP 6-7 cm. Carotids 2+ bilat; no bruits. No thyromegaly or nodule noted. Cor: PMI nondisplaced. RRR, No M/G/R noted Lungs: CTAB, normal effort. Abdomen: Soft, non-tender, non-distended, no HSM. No bruits or masses. +BS  Extremities: No cyanosis, clubbing, or rash. R and LLE no edema.  Neuro: Alert & orientedx3, cranial nerves grossly intact. moves all 4 extremities w/o difficulty.  Affect pleasant   Assessment/Plan: 1. Chronic systolic CHF: Nonischemic cardiomyopathy.  EF 20-25% on echo in 2/18.  LHC/RHC in 12/18 with low filling pressures, CI 2.0, and nonobstructive CAD. She has a Biotronik ICD.  Also of note, daughter and nephew have sarcoidosis => patient had a chest CT in 2/19 without evidence for pulmonary sarcoidosis.  We are unable to do an MRI to look for infiltrative disease due to ICD.  Echo in 12/18 showed EF 20-25%, noted to have LVH pattern that is mild in the basal segments and moderate to severe in the apical segments. This was concerning for apical HCM and study was repeated in 2/19 with Definity contrast.  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 We also did a workup for amyloidosis. She did not have evidence for AL amyloidosis, but TcPYP scan in 2/19 was strongly suggestive of transthyretin amyloidosis.  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 Genetic testing was negative, suggesting wild-type TTR amyloidosis. Echo 07/27/18: EF 30-35%, grade 1 DD.  - NYHA III chronically.  - Volume status stable on exam.   - Continue torsemide 40 mg daily.  - Increase losartan to 25 mg qhs.  - Continue digoxin, dig level 0.3 11/2 - Continue coreg 12.5 mg BID - Spiro held with recent hypotension. She does not want to start another pill at this time. Consider adding back next visit if symptoms/BP okay.  - Narrow QRS, not CRT candidate.  - Discussed limiting fluid and salt intake - Continue tafamidis - Referred to [facility] RN to help monitor BP at home.  2. PAD: s/p fem-fem bypass, followed by [facility].  No claudication, last ABIs somewhat worse in 4/19. She does not have clear claudication, pain in legs seems to be related to peripheral neuropathy and arthritis.  Continue to followup with [facility], sees Dr Oneida Alar soon. No change.    3. Carotid stenosis: Stable moderate stenosis on doppers. Follows with [facility]. No change.  4. Hyperlipidemia: Continue atorvastatin. Recent lipids were acceptable (LDL 79 09/2017). No change.  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 She has peripheral neuropathy.  Genetic testing negative, wild-type.  - Has not yet started Tafamidis. Long talk about cardiac amyloid today. She plans to start when she gets home.  6. Unsteady gait - Follwed by [facility] PT for vestibular and balance  Meds as above. RTC 2 months. Sooner with symptoms.   Shirley Friar, PA-C 08/13/2018   Greater than 50% of the 25 minute visit was spent in counseling/coordination of care regarding disease  state education, salt/fluid restriction, sliding scale diuretics, and medication compliance.

## 2018-08-14 ENCOUNTER — Ambulatory Visit (INDEPENDENT_AMBULATORY_CARE_PROVIDER_SITE_OTHER): Payer: Medicare Other

## 2018-08-14 DIAGNOSIS — I429 Cardiomyopathy, unspecified: Secondary | ICD-10-CM

## 2018-08-15 ENCOUNTER — Telehealth (HOSPITAL_COMMUNITY): Payer: Self-pay

## 2018-08-15 NOTE — Progress Notes (Signed)
Remote ICD transmission.   

## 2018-08-15 NOTE — Telephone Encounter (Signed)
Opened in error

## 2018-08-17 ENCOUNTER — Other Ambulatory Visit (HOSPITAL_COMMUNITY): Payer: Self-pay | Admitting: Internal Medicine

## 2018-09-02 ENCOUNTER — Telehealth (HOSPITAL_COMMUNITY): Payer: Self-pay | Admitting: *Deleted

## 2018-09-02 NOTE — Telephone Encounter (Signed)
Pt called stating she could not afford Tafamidis. Her copay is over $300 she would like a call back about patient assistance.   Message routed to [facility] ([place])

## 2018-09-03 NOTE — Telephone Encounter (Signed)
Left VM for Sharyn Lull, benefits specialist with [employer] for [employer], who will look into patient assistance for Ms. Bahri. Info relayed to Ms. Glowacki who was grateful for the assistance.   Ruta Hinds. Velva Harman, PharmD, BCPS, CPP Clinical Pharmacist Phone: [phone] 09/03/2018 1:59 PM

## 2018-09-04 NOTE — Telephone Encounter (Signed)
Sharyn Lull called back stating that patient will need proof of income for all adults in the household. Called Ms. Oien and left VM for her with this information.   Ruta Hinds. Velva Harman, PharmD, BCPS, CPP Clinical Pharmacist Phone: [phone] 09/04/2018 9:54 AM

## 2018-09-11 ENCOUNTER — Telehealth (HOSPITAL_COMMUNITY): Payer: Self-pay | Admitting: Pharmacist

## 2018-09-11 NOTE — Telephone Encounter (Signed)
Vyndalink states that patient needs 3 months of bank statements to be considered for patient assistance. Relayed info to Ms. Ricci who states that she will go back to her bank and get statements for herself and her husband from 05/2018 through 08/2018 and fax to [place] so that we can fax to [facility].  Ruta Hinds. Velva Harman, PharmD, BCPS, CPP Clinical Pharmacist Phone: [phone] 09/11/2018 12:24 PM

## 2018-09-21 LAB — CUP PACEART REMOTE DEVICE CHECK
Date Time Interrogation Session: 20191228175519
Implantable Lead Implant Date: 20140812
Implantable Lead Implant Date: 20140812
Implantable Lead Location: 753860
Implantable Lead Serial Number: 29451060
Implantable Pulse Generator Implant Date: 20140812
MDC IDC LEAD LOCATION: 753859
MDC IDC LEAD SERIAL: 10491693
Pulse Gen Model: 383562
Pulse Gen Serial Number: 60721404

## 2018-09-23 ENCOUNTER — Other Ambulatory Visit (HOSPITAL_COMMUNITY): Payer: Self-pay

## 2018-09-23 MED ORDER — CARVEDILOL 12.5 MG PO TABS: 12.5000 mg | ORAL_TABLET | Freq: Two times a day (BID) | ORAL | 1 refills | Status: DC

## 2018-10-01 ENCOUNTER — Other Ambulatory Visit: Payer: Self-pay | Admitting: Internal Medicine

## 2018-10-07 ENCOUNTER — Other Ambulatory Visit (HOSPITAL_COMMUNITY): Payer: Self-pay

## 2018-10-07 ENCOUNTER — Telehealth (HOSPITAL_COMMUNITY): Payer: Self-pay

## 2018-10-07 MED ORDER — TAFAMIDIS MEGLUMINE (CARDIAC) 20 MG PO CAPS: 20.0000 mg | ORAL_CAPSULE | Freq: Four times a day (QID) | ORAL | 3 refills | Status: DC

## 2018-10-07 NOTE — Telephone Encounter (Signed)
Lisa [name] from [facility] called to report that pt has some edema, SOB, weight gain 1 lb overnight. Pt is reportedly taking torsemide 20 mg (Take 2 tablets (40 mg total) by mouth daily), and pt states she is taking 80 mg daily. I called pt and she also states that she cannot sleep due to SOB. Please advise.

## 2018-10-07 NOTE — Telephone Encounter (Signed)
   Please call and set up follow up on Wed or [place]. Ask her to take 2.5 mg metoalzone x1 then as directed by HF clinic. Shend in a total of 4 tablets. . She will need this sent in to the pharmacy..   Can put on my schedule.   She will need BMET  Amy Clegg NP-C  3:27 PM

## 2018-10-07 NOTE — Telephone Encounter (Signed)
Left VM for pt to call clinic. 

## 2018-10-08 ENCOUNTER — Inpatient Hospital Stay (HOSPITAL_COMMUNITY)
Admission: EM | Admit: 2018-10-08 | Discharge: 2018-10-13 | DRG: 291 | Disposition: A | Discharge status: Home / Self Care | Payer: Medicare Other | Attending: Oncology | Admitting: Oncology

## 2018-10-08 ENCOUNTER — Other Ambulatory Visit (HOSPITAL_COMMUNITY): Payer: Self-pay

## 2018-10-08 ENCOUNTER — Encounter (HOSPITAL_COMMUNITY): Payer: Self-pay

## 2018-10-08 ENCOUNTER — Emergency Department (HOSPITAL_COMMUNITY): Payer: Medicare Other

## 2018-10-08 DIAGNOSIS — Z9119 Patient's noncompliance with other medical treatment and regimen: Secondary | ICD-10-CM

## 2018-10-08 DIAGNOSIS — R0902 Hypoxemia: Secondary | ICD-10-CM

## 2018-10-08 DIAGNOSIS — I110 Hypertensive heart disease with heart failure: Principal | ICD-10-CM | POA: Diagnosis present

## 2018-10-08 DIAGNOSIS — Z7984 Long term (current) use of oral hypoglycemic drugs: Secondary | ICD-10-CM

## 2018-10-08 DIAGNOSIS — I2510 Atherosclerotic heart disease of native coronary artery without angina pectoris: Secondary | ICD-10-CM | POA: Diagnosis present

## 2018-10-08 DIAGNOSIS — I5043 Acute on chronic combined systolic (congestive) and diastolic (congestive) heart failure: Secondary | ICD-10-CM | POA: Diagnosis present

## 2018-10-08 DIAGNOSIS — Z7951 Long term (current) use of inhaled steroids: Secondary | ICD-10-CM

## 2018-10-08 DIAGNOSIS — F419 Anxiety disorder, unspecified: Secondary | ICD-10-CM | POA: Diagnosis present

## 2018-10-08 DIAGNOSIS — Z79899 Other long term (current) drug therapy: Secondary | ICD-10-CM

## 2018-10-08 DIAGNOSIS — Z90710 Acquired absence of both cervix and uterus: Secondary | ICD-10-CM

## 2018-10-08 DIAGNOSIS — Z7982 Long term (current) use of aspirin: Secondary | ICD-10-CM

## 2018-10-08 DIAGNOSIS — E1165 Type 2 diabetes mellitus with hyperglycemia: Secondary | ICD-10-CM | POA: Diagnosis present

## 2018-10-08 DIAGNOSIS — K219 Gastro-esophageal reflux disease without esophagitis: Secondary | ICD-10-CM | POA: Diagnosis present

## 2018-10-08 DIAGNOSIS — E8582 Wild-type transthyretin-related (ATTR) amyloidosis: Secondary | ICD-10-CM | POA: Diagnosis present

## 2018-10-08 DIAGNOSIS — Z9660 Presence of unspecified orthopedic joint implant: Secondary | ICD-10-CM | POA: Diagnosis present

## 2018-10-08 DIAGNOSIS — J449 Chronic obstructive pulmonary disease, unspecified: Secondary | ICD-10-CM | POA: Diagnosis present

## 2018-10-08 DIAGNOSIS — Z9851 Tubal ligation status: Secondary | ICD-10-CM

## 2018-10-08 DIAGNOSIS — Z8249 Family history of ischemic heart disease and other diseases of the circulatory system: Secondary | ICD-10-CM

## 2018-10-08 DIAGNOSIS — Z8701 Personal history of pneumonia (recurrent): Secondary | ICD-10-CM

## 2018-10-08 DIAGNOSIS — Z9114 Patient's other noncompliance with medication regimen: Secondary | ICD-10-CM

## 2018-10-08 DIAGNOSIS — I493 Ventricular premature depolarization: Secondary | ICD-10-CM | POA: Diagnosis present

## 2018-10-08 DIAGNOSIS — Z87891 Personal history of nicotine dependence: Secondary | ICD-10-CM

## 2018-10-08 DIAGNOSIS — Z888 Allergy status to other drugs, medicaments and biological substances status: Secondary | ICD-10-CM

## 2018-10-08 DIAGNOSIS — Z95810 Presence of automatic (implantable) cardiac defibrillator: Secondary | ICD-10-CM | POA: Diagnosis present

## 2018-10-08 DIAGNOSIS — G40909 Epilepsy, unspecified, not intractable, without status epilepticus: Secondary | ICD-10-CM | POA: Diagnosis present

## 2018-10-08 DIAGNOSIS — E785 Hyperlipidemia, unspecified: Secondary | ICD-10-CM | POA: Diagnosis present

## 2018-10-08 DIAGNOSIS — Z885 Allergy status to narcotic agent status: Secondary | ICD-10-CM

## 2018-10-08 DIAGNOSIS — E1140 Type 2 diabetes mellitus with diabetic neuropathy, unspecified: Secondary | ICD-10-CM | POA: Diagnosis present

## 2018-10-08 DIAGNOSIS — Z79890 Hormone replacement therapy (postmenopausal): Secondary | ICD-10-CM

## 2018-10-08 DIAGNOSIS — I739 Peripheral vascular disease, unspecified: Secondary | ICD-10-CM | POA: Diagnosis present

## 2018-10-08 DIAGNOSIS — M170 Bilateral primary osteoarthritis of knee: Secondary | ICD-10-CM | POA: Diagnosis present

## 2018-10-08 DIAGNOSIS — E1151 Type 2 diabetes mellitus with diabetic peripheral angiopathy without gangrene: Secondary | ICD-10-CM | POA: Diagnosis present

## 2018-10-08 DIAGNOSIS — J9621 Acute and chronic respiratory failure with hypoxia: Secondary | ICD-10-CM | POA: Diagnosis present

## 2018-10-08 DIAGNOSIS — J441 Chronic obstructive pulmonary disease with (acute) exacerbation: Secondary | ICD-10-CM | POA: Diagnosis present

## 2018-10-08 DIAGNOSIS — I428 Other cardiomyopathies: Secondary | ICD-10-CM | POA: Diagnosis present

## 2018-10-08 DIAGNOSIS — E1159 Type 2 diabetes mellitus with other circulatory complications: Secondary | ICD-10-CM | POA: Diagnosis present

## 2018-10-08 DIAGNOSIS — M4690 Unspecified inflammatory spondylopathy, site unspecified: Secondary | ICD-10-CM | POA: Diagnosis present

## 2018-10-08 HISTORY — DX: Dyspnea, unspecified: R06.00

## 2018-10-08 LAB — CBC
HCT: 39.6 % (ref 36.0–46.0)
HEMOGLOBIN: 12.5 g/dL (ref 12.0–15.0)
MCH: 32.1 pg (ref 26.0–34.0)
MCHC: 31.6 g/dL (ref 30.0–36.0)
MCV: 101.5 fL — AB (ref 80.0–100.0)
NRBC: 0 % (ref 0.0–0.2)
PLATELETS: 302 10*3/uL (ref 150–400)
RBC: 3.9 MIL/uL (ref 3.87–5.11)
RDW: 13.6 % (ref 11.5–15.5)
WBC: 5.6 10*3/uL (ref 4.0–10.5)

## 2018-10-08 LAB — I-STAT TROPONIN, ED: TROPONIN I, POC: 0.04 ng/mL (ref 0.00–0.08)

## 2018-10-08 MED ORDER — METOLAZONE 2.5 MG PO TABS: 2.5000 mg | ORAL_TABLET | ORAL | 0 refills | Status: DC | PRN

## 2018-10-08 NOTE — ED Triage Notes (Signed)
Pt having CP, SOB, dizziness for the past 2-3 days. Hx of CHF and ICD placement

## 2018-10-08 NOTE — Telephone Encounter (Signed)
Pt notified. Verbalizes understanding. Rx sent to pharmacy and appt has been made.

## 2018-10-09 ENCOUNTER — Encounter (HOSPITAL_COMMUNITY): Payer: Self-pay | Admitting: General Practice

## 2018-10-09 ENCOUNTER — Other Ambulatory Visit: Payer: Self-pay

## 2018-10-09 DIAGNOSIS — Z7984 Long term (current) use of oral hypoglycemic drugs: Secondary | ICD-10-CM

## 2018-10-09 DIAGNOSIS — N189 Chronic kidney disease, unspecified: Secondary | ICD-10-CM

## 2018-10-09 DIAGNOSIS — I420 Dilated cardiomyopathy: Secondary | ICD-10-CM

## 2018-10-09 DIAGNOSIS — Z79899 Other long term (current) drug therapy: Secondary | ICD-10-CM

## 2018-10-09 DIAGNOSIS — R718 Other abnormality of red blood cells: Secondary | ICD-10-CM

## 2018-10-09 DIAGNOSIS — E1122 Type 2 diabetes mellitus with diabetic chronic kidney disease: Secondary | ICD-10-CM

## 2018-10-09 DIAGNOSIS — I130 Hypertensive heart and chronic kidney disease with heart failure and stage 1 through stage 4 chronic kidney disease, or unspecified chronic kidney disease: Secondary | ICD-10-CM | POA: Diagnosis not present

## 2018-10-09 DIAGNOSIS — Z87891 Personal history of nicotine dependence: Secondary | ICD-10-CM

## 2018-10-09 DIAGNOSIS — Z885 Allergy status to narcotic agent status: Secondary | ICD-10-CM

## 2018-10-09 DIAGNOSIS — Z950 Presence of cardiac pacemaker: Secondary | ICD-10-CM

## 2018-10-09 DIAGNOSIS — E8582 Wild-type transthyretin-related (ATTR) amyloidosis: Secondary | ICD-10-CM | POA: Diagnosis present

## 2018-10-09 DIAGNOSIS — I493 Ventricular premature depolarization: Secondary | ICD-10-CM

## 2018-10-09 DIAGNOSIS — Z888 Allergy status to other drugs, medicaments and biological substances status: Secondary | ICD-10-CM

## 2018-10-09 DIAGNOSIS — Z95820 Peripheral vascular angioplasty status with implants and grafts: Secondary | ICD-10-CM

## 2018-10-09 DIAGNOSIS — G40909 Epilepsy, unspecified, not intractable, without status epilepticus: Secondary | ICD-10-CM

## 2018-10-09 DIAGNOSIS — Z7982 Long term (current) use of aspirin: Secondary | ICD-10-CM

## 2018-10-09 DIAGNOSIS — E1142 Type 2 diabetes mellitus with diabetic polyneuropathy: Secondary | ICD-10-CM

## 2018-10-09 DIAGNOSIS — E785 Hyperlipidemia, unspecified: Secondary | ICD-10-CM

## 2018-10-09 DIAGNOSIS — I5043 Acute on chronic combined systolic (congestive) and diastolic (congestive) heart failure: Secondary | ICD-10-CM | POA: Diagnosis not present

## 2018-10-09 DIAGNOSIS — I440 Atrioventricular block, first degree: Secondary | ICD-10-CM

## 2018-10-09 DIAGNOSIS — J449 Chronic obstructive pulmonary disease, unspecified: Secondary | ICD-10-CM | POA: Diagnosis present

## 2018-10-09 DIAGNOSIS — E1151 Type 2 diabetes mellitus with diabetic peripheral angiopathy without gangrene: Secondary | ICD-10-CM

## 2018-10-09 LAB — BASIC METABOLIC PANEL
Anion gap: 12 (ref 5–15)
BUN: 25 mg/dL — AB (ref 8–23)
CHLORIDE: 95 mmol/L — AB (ref 98–111)
CO2: 31 mmol/L (ref 22–32)
Calcium: 9.4 mg/dL (ref 8.9–10.3)
Creatinine, Ser: 1.12 mg/dL — ABNORMAL HIGH (ref 0.44–1.00)
GFR calc Af Amer: 57 mL/min — ABNORMAL LOW (ref >60)
GFR calc non Af Amer: 49 mL/min — ABNORMAL LOW (ref >60)
GLUCOSE: 244 mg/dL — AB (ref 70–99)
POTASSIUM: 4.2 mmol/L (ref 3.5–5.1)
Sodium: 138 mmol/L (ref 135–145)

## 2018-10-09 LAB — GLUCOSE, CAPILLARY
GLUCOSE-CAPILLARY: 282 mg/dL — AB (ref 70–99)
Glucose-Capillary: 313 mg/dL — ABNORMAL HIGH (ref 70–99)
Glucose-Capillary: 380 mg/dL — ABNORMAL HIGH (ref 70–99)

## 2018-10-09 LAB — BRAIN NATRIURETIC PEPTIDE: B Natriuretic Peptide: 392 pg/mL — ABNORMAL HIGH (ref 0.0–100.0)

## 2018-10-09 MED ORDER — FUROSEMIDE 10 MG/ML IJ SOLN
60.0000 mg | Freq: Once | INTRAMUSCULAR | Status: AC
  Administered 2018-10-10: 60 mg via INTRAVENOUS
  Filled 2018-10-09: qty 6

## 2018-10-09 MED ORDER — OXCARBAZEPINE 150 MG PO TABS
150.0000 mg | ORAL_TABLET | Freq: Three times a day (TID) | ORAL | Status: DC
  Administered 2018-10-09 – 2018-10-13 (×12): 150 mg via ORAL
  Filled 2018-10-09 (×15): qty 1

## 2018-10-09 MED ORDER — ALBUTEROL SULFATE HFA 108 (90 BASE) MCG/ACT IN AERS: 2.0000 | INHALATION_SPRAY | Freq: Four times a day (QID) | RESPIRATORY_TRACT | Status: DC | PRN

## 2018-10-09 MED ORDER — ALLOPURINOL 100 MG PO TABS
100.0000 mg | ORAL_TABLET | Freq: Every day | ORAL | Status: DC
  Administered 2018-10-09 – 2018-10-13 (×5): 100 mg via ORAL
  Filled 2018-10-09 (×6): qty 1

## 2018-10-09 MED ORDER — FLUTICASONE FUROATE-VILANTEROL 200-25 MCG/INH IN AEPB
1.0000 | INHALATION_SPRAY | Freq: Every day | RESPIRATORY_TRACT | Status: DC
  Administered 2018-10-10 – 2018-10-13 (×4): 1 via RESPIRATORY_TRACT
  Filled 2018-10-09: qty 28

## 2018-10-09 MED ORDER — ASPIRIN EC 81 MG PO TBEC
162.0000 mg | DELAYED_RELEASE_TABLET | Freq: Every day | ORAL | Status: DC
  Administered 2018-10-09 – 2018-10-13 (×5): 162 mg via ORAL
  Filled 2018-10-09 (×5): qty 2

## 2018-10-09 MED ORDER — ALBUTEROL SULFATE (2.5 MG/3ML) 0.083% IN NEBU: 3.0000 mL | INHALATION_SOLUTION | RESPIRATORY_TRACT | Status: DC | PRN

## 2018-10-09 MED ORDER — DIGOXIN 125 MCG PO TABS: 0.0625 mg | ORAL_TABLET | Freq: Every day | ORAL | Status: DC

## 2018-10-09 MED ORDER — ACETAMINOPHEN 325 MG PO TABS
650.0000 mg | ORAL_TABLET | Freq: Four times a day (QID) | ORAL | Status: DC | PRN
  Administered 2018-10-10 – 2018-10-13 (×6): 650 mg via ORAL
  Filled 2018-10-09 (×6): qty 2

## 2018-10-09 MED ORDER — DIGOXIN 125 MCG PO TABS
0.0625 mg | ORAL_TABLET | Freq: Every day | ORAL | Status: DC
  Administered 2018-10-09 – 2018-10-13 (×5): 0.0625 mg via ORAL
  Filled 2018-10-09 (×5): qty 1

## 2018-10-09 MED ORDER — PREDNISONE 20 MG PO TABS
40.0000 mg | ORAL_TABLET | Freq: Once | ORAL | Status: AC
  Administered 2018-10-09: 40 mg via ORAL
  Filled 2018-10-09: qty 2

## 2018-10-09 MED ORDER — LEVOTHYROXINE SODIUM 25 MCG PO TABS
25.0000 ug | ORAL_TABLET | Freq: Every day | ORAL | Status: DC
  Administered 2018-10-10 – 2018-10-13 (×4): 25 ug via ORAL
  Filled 2018-10-09 (×4): qty 1

## 2018-10-09 MED ORDER — ENOXAPARIN SODIUM 40 MG/0.4ML ~~LOC~~ SOLN
40.0000 mg | SUBCUTANEOUS | Status: DC
  Administered 2018-10-09 – 2018-10-12 (×4): 40 mg via SUBCUTANEOUS
  Filled 2018-10-09 (×4): qty 0.4

## 2018-10-09 MED ORDER — TAFAMIDIS MEGLUMINE (CARDIAC) 20 MG PO CAPS: 20.0000 mg | ORAL_CAPSULE | Freq: Four times a day (QID) | ORAL | Status: DC

## 2018-10-09 MED ORDER — IPRATROPIUM-ALBUTEROL 0.5-2.5 (3) MG/3ML IN SOLN: 3.0000 mL | Freq: Four times a day (QID) | RESPIRATORY_TRACT | Status: DC | PRN

## 2018-10-09 MED ORDER — FUROSEMIDE 10 MG/ML IJ SOLN
60.0000 mg | Freq: Once | INTRAMUSCULAR | Status: AC
  Administered 2018-10-09: 60 mg via INTRAVENOUS
  Filled 2018-10-09: qty 6

## 2018-10-09 MED ORDER — IPRATROPIUM-ALBUTEROL 0.5-2.5 (3) MG/3ML IN SOLN
3.0000 mL | Freq: Once | RESPIRATORY_TRACT | Status: AC
  Administered 2018-10-09: 3 mL via RESPIRATORY_TRACT
  Filled 2018-10-09: qty 3

## 2018-10-09 MED ORDER — ACETAMINOPHEN 650 MG RE SUPP: 650.0000 mg | Freq: Four times a day (QID) | RECTAL | Status: DC | PRN

## 2018-10-09 MED ORDER — CARVEDILOL 12.5 MG PO TABS: 12.5000 mg | ORAL_TABLET | Freq: Two times a day (BID) | ORAL | Status: DC

## 2018-10-09 MED ORDER — INSULIN ASPART 100 UNIT/ML ~~LOC~~ SOLN
0.0000 [IU] | Freq: Every day | SUBCUTANEOUS | Status: DC
  Administered 2018-10-09 – 2018-10-11 (×2): 3 [IU] via SUBCUTANEOUS
  Administered 2018-10-12: 4 [IU] via SUBCUTANEOUS

## 2018-10-09 MED ORDER — LOSARTAN POTASSIUM 50 MG PO TABS: 25.0000 mg | ORAL_TABLET | Freq: Every day | ORAL | Status: DC

## 2018-10-09 MED ORDER — INSULIN ASPART 100 UNIT/ML ~~LOC~~ SOLN
0.0000 [IU] | Freq: Three times a day (TID) | SUBCUTANEOUS | Status: DC
  Administered 2018-10-09: 15 [IU] via SUBCUTANEOUS
  Administered 2018-10-09: 11 [IU] via SUBCUTANEOUS
  Administered 2018-10-10 (×2): 3 [IU] via SUBCUTANEOUS
  Administered 2018-10-10: 5 [IU] via SUBCUTANEOUS
  Administered 2018-10-11: 3 [IU] via SUBCUTANEOUS
  Administered 2018-10-11: 8 [IU] via SUBCUTANEOUS
  Administered 2018-10-11: 5 [IU] via SUBCUTANEOUS
  Administered 2018-10-12: 11 [IU] via SUBCUTANEOUS
  Administered 2018-10-12 – 2018-10-13 (×3): 3 [IU] via SUBCUTANEOUS

## 2018-10-09 MED ORDER — PREGABALIN 75 MG PO CAPS
150.0000 mg | ORAL_CAPSULE | Freq: Two times a day (BID) | ORAL | Status: DC
  Administered 2018-10-09 – 2018-10-13 (×9): 150 mg via ORAL
  Filled 2018-10-09 (×9): qty 2

## 2018-10-09 MED ORDER — ATORVASTATIN CALCIUM 10 MG PO TABS
20.0000 mg | ORAL_TABLET | Freq: Every day | ORAL | Status: DC
  Administered 2018-10-09 – 2018-10-12 (×4): 20 mg via ORAL
  Filled 2018-10-09 (×4): qty 2

## 2018-10-09 MED ORDER — SENNOSIDES-DOCUSATE SODIUM 8.6-50 MG PO TABS
1.0000 | ORAL_TABLET | Freq: Every evening | ORAL | Status: DC | PRN
  Filled 2018-10-09: qty 1

## 2018-10-09 NOTE — H&P (Addendum)
Date: 10/09/2018               Patient Name:  Lisa [name] MRN: [id number]  DOB: 26-Feb-1946 Age / Sex: 73 y.o., female   PCP: [name], MD         Medical Service: Internal Medicine Teaching Service         Attending Physician: Dr. Annia Belt, MD    First Contact: Dr. Laural Golden Pager: [phone]  Second Contact: Dr. Tarri Abernethy Pager: [phone]       After Hours (After 5p/  First Contact Pager: [phone]  weekends / holidays): Second Contact Pager: [phone]   Chief Complaint: Shortness of breath  History of Present Illness: Ms. Lisa [name] is a 73 year old female with past medical history of nonischemic cardiomyopathy status post biotronik ICD placement, hypertension,  COPD, diabetes mellitus type 2, and peripheral arterial disease who presents to [facility] for 3 day history of progressively worsening shortness of breath.  She noticed her symptoms are worse when walking and when laying flat. She reports increased thirst and does not restrict her fluid intake. She also reports taking torsemide 40 mg once a day although prescribed 80 mg daily.  She states that she does not want it to affect her kidneys and therefore does not take her fluid pill as prescribed.  She has associated symptoms of leg swelling.  She denies fever/chills, nausea/vomiting, chest pain or abdominal pain  Meds:  Current Meds  Medication Sig  . albuterol (PROVENTIL HFA;VENTOLIN HFA) 108 (90 Base) MCG/ACT inhaler Inhale 2 puffs into the lungs every 6 (six) hours as needed for wheezing or shortness of breath.  [name] albuterol (PROVENTIL) (2.5 MG/3ML) 0.083% nebulizer solution Take 3 mLs by nebulization every 4 (four) hours as needed for wheezing.  [name] allopurinol (ZYLOPRIM) 100 MG tablet Take 100 mg by mouth daily.  [name] aspirin EC 81 MG tablet Take 162 mg by mouth daily.  [name] atorvastatin (LIPITOR) 20 MG tablet TAKE 1 TABLET BY MOUTH EVERY DAY (Patient taking differently: Take 20 mg by mouth daily at 6  PM. )  . carvedilol (COREG) 12.5 MG tablet Take 1 tablet (12.5 mg total) by mouth 2 (two) times daily with a meal.  . Cyanocobalamin (B-12) 1000 MCG/ML KIT Inject 1,000 mg as directed every 30 (thirty) days.  . digoxin (DIGOX) 0.125 MG tablet Take 0.5 tablets (0.0625 mg total) by mouth daily.  . fluticasone furoate-vilanterol (BREO ELLIPTA) 200-25 MCG/INH AEPB Inhale 1 puff into the lungs daily.  [name] glimepiride (AMARYL) 2 MG tablet Take 2 mg by mouth daily.  [name] levothyroxine (SYNTHROID, LEVOTHROID) 25 MCG tablet Take 25 mcg by mouth daily before breakfast.  . losartan (COZAAR) 25 MG tablet Take 1 tablet (25 mg total) by mouth at bedtime.  . OXcarbazepine (TRILEPTAL) 150 MG tablet Take 150 mg by mouth 3 (three) times daily.   . pregabalin (LYRICA) 150 MG capsule Take 150 mg by mouth 2 (two) times daily.  . Tafamidis Meglumine, Cardiac, 20 MG CAPS Take 20 mg by mouth 4 (four) times daily. (Patient taking differently: Take 80 mg by mouth daily. )  . torsemide (DEMADEX) 20 MG tablet Take 2 tablets (40 mg total) by mouth daily. (Patient taking differently: Take 40-80 mg by mouth See admin instructions. Took 80 mg in the morning and 40 mg every afternoon)     Allergies: Allergies as of 10/08/2018 - Review Complete 10/08/2018  Allergen Reaction Noted  . Oxycodone Shortness Of Breath  12/11/2014  . Entresto [sacubitril-valsartan] Other (See Comments) 08/27/2017   Past Medical History:  Diagnosis Date  . AICD (automatic cardioverter/defibrillator) present 2014   Biotroniks  . Arthritis    knees with arthritis as well as has back, pt. remarks that her hands fall asleep when she is in the bed or in certain positions   . Bell's palsy   . Carotid artery disease ([place])    a. duplex - moderate bilateral carotid disease (60-79% 06/2017).  . Chronic systolic CHF (congestive heart failure) ([place])   . COPD (chronic obstructive pulmonary disease) ([place])   . Diabetes mellitus without complication (HCC)     borderline  . Former tobacco use   . GERD (gastroesophageal reflux disease)    uses otc for occas. heartburn   . Hyperlipidemia   . Hypertension   . Mild CAD    a. nonobstructive CAD (50% PLOM 08/2017).  . Neuropathy   . Non-ischemic cardiomyopathy ([place])   . PAD (peripheral artery disease) ([place])    a.  s/p L-R fem-fem BPG 2016.  [name] Pneumonia    hosp. in [place]-2015  . Subclavian steal syndrome    a. h/o left subclavian steal.    Family History:  Family History  Problem Relation Age of Onset  . Heart disease Mother   . Hypertension Mother     Social History: Tobacco use:  40-pack-year smoking history, quit 7 years ago Alcohol use: Denies Illicit drug use: Denies  Review of Systems: A complete ROS was negative except as per HPI.   Physical Exam: Blood pressure (!) 96/56, pulse 72, temperature 98.6 F (37 C), resp. rate (!) 25, SpO2 97 %. Physical Exam  Constitutional: She is well-developed, well-nourished, and in no distress.  HENT:  Head: Normocephalic and atraumatic.  Eyes: Pupils are equal, round, and reactive to light.  Neck: Normal range of motion. Neck supple. JVD present.  Cardiovascular: Normal rate, regular rhythm and normal heart sounds. Exam reveals no gallop and no friction rub.  No murmur heard. Pulmonary/Chest: Effort normal. No respiratory distress. She has no wheezes.  Scant rales at the lung bases  Abdominal: Soft. Bowel sounds are normal. She exhibits no distension and no mass. There is no abdominal tenderness. There is no rebound and no guarding.  Musculoskeletal:     Comments: Trace pitting edema bilaterally   Neurological: She is alert.  Skin: Skin is warm and dry. No erythema.     EKG: personally reviewed my interpretation is normal sinus rhythm without ischemic changes  CXR: personally reviewed my interpretation is no acute cardiopulmonary abnormality   Assessment & Plan by Problem: Principal Problem:   Acute on chronic combined  systolic and diastolic heart failure (HCC) Active Problems:   Type 2 diabetes mellitus with vascular disease (HCC)   COPD (chronic obstructive pulmonary disease) (HCC)  Acute on chronic combined systolic and diastolic heart failure Patient's BNP is elevated and JVD noted on exam.  Previous echo 07/2018 with LV EF 30-35%.  She reports significant improvement in respiratory status since given one dose of 60m IV lasix in ED.  She has been urinating frequently in the ED.  I suspect her heart failure exacerbation is from non compliance of her diuretics and increased fluid intake due to uncontrolled diabetes.    She is currently on 3L supplemental Oxygen via [place] and does not use supplemental oxygen at home.  Todays weight is 193lbs (unclear if this was done after diuresis as she did not have a weight  on admission).  Per last heart failure note dry weight is around 184lb. -strict I/Os -daily weights -bmet -digoxin  -ambulate with pulse ox - IV 78m lasix  COPD No wheezing on exam. Patient denies increased wheezing at home or exacerbation of her COPD symptoms. Given a dose of prednisone in the ED. Will monitor and restart home inhalers for now. -Restart home inhalers  Transthyretin amyloidosis Followed by Dr. MAundra Dubinwho reports a TcPYP scan that was strongly suggestive of transthyretin amyloidosis.  Concern for wild-type TTR amyloidosis.  Recently prescribed tafamidis. -continue tafamidis  HTN Soft blood pressures.  Will hold losartan and carvedilol. Can restart if needed.  Uncontrolled Type II diabetes mellitus Neuropathy Follows with [facility].  Currently takes glipizide and was recently started on metformin but has not started taking.  HgbA1C  7.7 on 10/03/2018.  Will do SSI here and adjust as needed. -SSI - Lyrica  Peripheral arterial disease Hyperlipidemia  Followed by [facility].  S/pleft to right femoral-femoral bypass, right femoral endarterectomy on  12/21/14. -aspirin -atorvastatin  Seizure disorder -continue home oxcarbazepine  Elevated MCV MCV 101.5, vitamin B12 normal 2 months ago. No anemia noted.  CKD Creatinine stable  -bmet  Dispo: Admit patient to Inpatient with expected length of stay greater than 2 midnights.  Signed: HValinda Party DO 10/09/2018, 9:12 AM  Pager: [phone]

## 2018-10-09 NOTE — ED Notes (Signed)
Paged admitting per RN  

## 2018-10-09 NOTE — ED Notes (Signed)
Pt's sats 87% on RA at rest - placed on [facility]

## 2018-10-09 NOTE — ED Notes (Signed)
Notified admitting of pt's recent hypotension

## 2018-10-09 NOTE — Progress Notes (Signed)
Pt arrived to the unit. Oriented to the unit and room , verbalized understanding able to walk to Bed from stretcher however c/o dizziness. Family at the bedside.

## 2018-10-09 NOTE — ED Provider Notes (Signed)
[facility] EMERGENCY DEPARTMENT Provider Note  CSN: [id number] Arrival date & time: 10/08/18 2310  Chief Complaint(s) Shortness of Breath  HPI Lisa [name] is a 73 y.o. female with a history of chronic combined systolic and diastolic heart failure with a last EF of 30 to 35% on November 2019  The history is provided by the patient.  Shortness of Breath  Severity:  Moderate Onset quality:  Gradual Duration:  3 days Timing:  Constant Progression:  Worsening Chronicity:  Recurrent Context: not URI   Relieved by:  Nothing Worsened by:  Activity Ineffective treatments:  Inhaler and diuretics Associated symptoms: chest pain   Associated symptoms: no cough, no fever and no sputum production   Chest pain:    Quality: sharp and stabbing     Severity:  Mild   Duration: last felt pain 2 days ago.   Timing:  Intermittent   Chronicity:  Recurrent Risk factors: no hx of PE/DVT    She reports that she supposed to be taking 80 mg of torsemide but she is only taking 40.  States that she is concerned that is going to give her renal insufficiency.   Past Medical History Past Medical History:  Diagnosis Date  . AICD (automatic cardioverter/defibrillator) present 2014   Biotroniks  . Arthritis    knees with arthritis as well as has back, pt. remarks that her hands fall asleep when she is in the bed or in certain positions   . Bell's palsy   . Carotid artery disease ([place])    a. duplex - moderate bilateral carotid disease (60-79% 06/2017).  . Chronic systolic CHF (congestive heart failure) ([place])   . COPD (chronic obstructive pulmonary disease) ([place])   . Diabetes mellitus without complication (HCC)    borderline  . Former tobacco use   . GERD (gastroesophageal reflux disease)    uses otc for occas. heartburn   . Hyperlipidemia   . Hypertension   . Mild CAD    a. nonobstructive CAD (50% PLOM 08/2017).  . Neuropathy   . Non-ischemic cardiomyopathy ([place])   .  PAD (peripheral artery disease) ([place])    a.  s/p L-R fem-fem BPG 2016.  [name] Pneumonia    hosp. in [place]-2015  . Subclavian steal syndrome    a. h/o left subclavian steal.   Patient Active Problem List   Diagnosis Date Noted  . Chest pain 07/27/2018  . Hyperglycemia 07/27/2018  . Type 2 diabetes mellitus with vascular disease ([place]) 07/27/2018  . Community acquired pneumonia 03/18/2018  . Acute on chronic systolic CHF (congestive heart failure) ([place]) 09/20/2017  . Mild CAD 09/20/2017  . COPD exacerbation ([place]) 09/20/2017  . Wide-complex tachycardia ([place]) 09/20/2017  . Moderate mitral regurgitation 09/20/2017  . Moderate tricuspid regurgitation 09/20/2017  . Acute on chronic systolic (congestive) heart failure ([place]) 09/20/2017  . Acute on chronic combined systolic and diastolic CHF (congestive heart failure) ([place]) 04/09/2017  . Nonischemic cardiomyopathy ([place])  04/09/2017  . Dyslipidemia 04/09/2017  . ICD (implantable cardioverter-defibrillator) in place 04/09/2017  . Essential hypertension 04/09/2017  . Discoloration of skin of toe-Right 3rd Toe 12/31/2014  . Wound drainage-Right Groin 12/31/2014  . Right leg swelling 12/31/2014  . Pain in joint, ankle and foot 12/31/2014  . 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 Paresthesia of both hands 03/19/2014  . Numbness- Right foot 03/19/2014  . Right foot pain 03/19/2014  . Peripheral vascular disease, unspecified ([place]) 12/26/2012  . Occlusion  and stenosis of carotid artery without mention of cerebral infarction 11/14/2012   Home Medication(s) Prior to Admission medications   Medication Sig Start Date End Date Taking? Authorizing Provider  albuterol (PROVENTIL HFA;VENTOLIN HFA) 108 (90 Base) MCG/ACT inhaler Inhale 2 puffs into the lungs every 6 (six) hours as needed for wheezing or shortness of breath. 03/20/18  Yes Lockamy, Timothy, DO  albuterol (PROVENTIL) (2.5 MG/3ML) 0.083%  nebulizer solution Take 3 mLs by nebulization every 4 (four) hours as needed for wheezing. 08/01/17  Yes [provider]  allopurinol (ZYLOPRIM) 100 MG tablet Take 100 mg by mouth daily.   Yes [provider]  aspirin EC 81 MG tablet Take 162 mg by mouth daily.   Yes [provider]  atorvastatin (LIPITOR) 20 MG tablet TAKE 1 TABLET BY MOUTH EVERY DAY Patient taking differently: Take 20 mg by mouth daily at 6 PM.  08/19/18  Yes Bensimhon, Shaune Pascal, MD  carvedilol (COREG) 12.5 MG tablet Take 1 tablet (12.5 mg total) by mouth 2 (two) times daily with a meal. 09/23/18  Yes Larey Dresser, MD  Cyanocobalamin (B-12) 1000 MCG/ML KIT Inject 1,000 mg as directed every 30 (thirty) days. 08/20/18  Yes [provider]  digoxin (DIGOX) 0.125 MG tablet Take 0.5 tablets (0.0625 mg total) by mouth daily. 07/18/18  Yes Larey Dresser, MD  fluticasone furoate-vilanterol (BREO ELLIPTA) 200-25 MCG/INH AEPB Inhale 1 puff into the lungs daily.   Yes [provider]  glimepiride (AMARYL) 2 MG tablet Take 2 mg by mouth daily. 07/11/17  Yes [provider]  levothyroxine (SYNTHROID, LEVOTHROID) 25 MCG tablet Take 25 mcg by mouth daily before breakfast.   Yes [provider]  losartan (COZAAR) 25 MG tablet Take 1 tablet (25 mg total) by mouth at bedtime. 08/13/18  Yes Shirley Friar, PA-C  OXcarbazepine (TRILEPTAL) 150 MG tablet Take 150 mg by mouth 3 (three) times daily.  05/14/17  Yes [provider]  pregabalin (LYRICA) 150 MG capsule Take 150 mg by mouth 2 (two) times daily.   Yes [provider]  Tafamidis Meglumine, Cardiac, 20 MG CAPS Take 20 mg by mouth 4 (four) times daily. Patient taking differently: Take 80 mg by mouth daily.  10/07/18  Yes Larey Dresser, MD  torsemide (DEMADEX) 20 MG tablet Take 2 tablets (40 mg total) by mouth daily. Patient taking differently: Take 40-80 mg by mouth See admin instructions. Took 80 mg  in the morning and 40 mg every afternoon 07/29/18 10/27/18 Yes Georgiana Shore, NP  metFORMIN (GLUCOPHAGE-XR) 500 MG 24 hr tablet Take 500 mg by mouth 2 (two) times daily. 10/03/18   [provider]  metolazone (ZAROXOLYN) 2.5 MG tablet Take 1 tablet (2.5 mg total) by mouth as needed. 10/08/18   Conrad Rome, NP  Past Surgical History Past Surgical History:  Procedure Laterality Date  . ABDOMINAL AORTAGRAM N/A 11/27/2014   Procedure: ABDOMINAL Maxcine Ham;  Surgeon: Elam Dutch, MD;  Location: [facility] CATH LAB;  Service: Cardiovascular;  Laterality: N/A;  . ABDOMINAL HYSTERECTOMY    . APPENDECTOMY    . CARDIAC CATHETERIZATION     02/20/13: Normal coronaries, moderate LV dysfunction, EF 35%. Medical RX ([facility])  . CARDIAC DEFIBRILLATOR PLACEMENT  Aug. 2014   dual chamber [employer]otron[employer] ICD 05/06/13 ([facility], Dr. Minna Merritts)  . ENDARTERECTOMY FEMORAL Right 12/21/2014   Procedure: ENDARTERECTOMY FEMORAL;  Surgeon: Elam Dutch, MD;  Location: [place];  Service: Vascular;  Laterality: Right;  . FEMORAL-FEMORAL BYPASS GRAFT Bilateral 12/21/2014   Procedure: BYPASS GRAFT LEFT FEMORAL-RIGHT FEMORAL ARTERY;  Surgeon: Elam Dutch, MD;  Location: [place];  Service: Vascular;  Laterality: Bilateral;  . JOINT REPLACEMENT Bilateral    implants  . RIGHT/LEFT HEART CATH AND CORONARY ANGIOGRAPHY N/A 08/28/2017   Procedure: RIGHT/LEFT HEART CATH AND CORONARY ANGIOGRAPHY;  Surgeon: Larey Dresser, MD;  Location: [place] CV LAB;  Service: Cardiovascular;  Laterality: N/A;  . TUBAL LIGATION     Family History Family History  Problem Relation Age of Onset  . Heart disease Mother   . Hypertension Mother     Social History Social History   Tobacco Use  . Smoking status: Former Smoker    Types: Cigarettes    Last attempt to quit: 11/14/2010    Years since quitting: 7.9  . Smokeless  tobacco: Never Used  Substance Use Topics  . Alcohol use: No    Alcohol/week: 0.0 standard drinks  . Drug use: No   Allergies Oxycodone and Entresto [sacubitril-valsartan]  Review of Systems Review of Systems  Constitutional: Negative for fever.  Respiratory: Positive for shortness of breath. Negative for cough and sputum production.   Cardiovascular: Positive for chest pain.   All other systems are reviewed and are negative for acute change except as noted in the HPI  Physical Exam Vital Signs  I have reviewed the triage vital signs BP 106/69 (BP Location: Right Arm)   Pulse 73   Temp 98.6 F (37 C)   Resp 20   SpO2 100%   Physical Exam Vitals signs reviewed.  Constitutional:      General: She is not in acute distress.    Appearance: She is well-developed. She is not diaphoretic.  HENT:     Head: Normocephalic and atraumatic.     Nose: Nose normal.  Eyes:     General: No scleral icterus.       Right eye: No discharge.        Left eye: No discharge.     Conjunctiva/sclera: Conjunctivae normal.     Pupils: Pupils are equal, round, and reactive to light.  Neck:     Musculoskeletal: Normal range of motion and neck supple.  Cardiovascular:     Rate and Rhythm: Normal rate and regular rhythm.     Heart sounds: No murmur. No friction rub. No gallop.   Pulmonary:     Effort: Pulmonary effort is normal. Tachypnea present.     Breath sounds: Decreased air movement (throughout) present. No stridor. No rales.  Abdominal:     General: There is no distension.     Palpations: Abdomen is soft.     Tenderness: There is no abdominal tenderness.  Musculoskeletal:        General: No tenderness.  Skin:    General: Skin is  warm and dry.     Findings: No erythema or rash.  Neurological:     Mental Status: She is alert and oriented to person, place, and time.     ED Results and Treatments Labs (all labs ordered are listed, but only abnormal results are displayed) Labs  Reviewed  BASIC METABOLIC PANEL - Abnormal; Notable for the following components:      Result Value   Chloride 95 (*)    Glucose, Bld 244 (*)    BUN 25 (*)    Creatinine, Ser 1.12 (*)    GFR calc non Af Amer 49 (*)    GFR calc Af Amer 57 (*)    All other components within normal limits  CBC - Abnormal; Notable for the following components:   MCV 101.5 (*)    All other components within normal limits  I-STAT TROPONIN, ED                                                                                                                         EKG  EKG Interpretation  Date/Time:  Tuesday October 08 2018 23:18:46 EST Ventricular Rate:  65 PR Interval:  254 QRS Duration: 110 QT Interval:  396 QTC Calculation: 411 R Axis:   -115 Text Interpretation:  ** Suspect unspecified pacemaker failure Sinus rhythm with 1st degree A-V block with occasional atrial-paced complexes and Premature atrial complexes Septal infarct , age undetermined Lateral infarct , age undetermined Abnormal ECG Artifact  Poor data quality, interpretation may be adversely affected Confirmed by Addison Lank [phone]) on 10/09/2018 2:10:54 AM        EKG Interpretation  Date/Time:  Wednesday October 09 2018 07:00:36 EST Ventricular Rate:  82 PR Interval:  254 QRS Duration: 114 QT Interval:  380 QTC Calculation: 444 R Axis:   -66 Text Interpretation:  Sinus rhythm Multiple ventricular premature complexes Prolonged PR interval Left anterior fascicular block Anterior infarct, old NO STEMI Otherwise no significant change from Nov 2019 EKG Confirmed by Addison Lank [phone]) on 10/09/2018 7:04:58 AM       Radiology Dg Chest 2 View  Result Date: 10/08/2018 CLINICAL DATA:  72 year old female with chest pain and shortness of breath for 3 days. EXAM: CHEST - 2 VIEW COMPARISON:  07/26/2018 and earlier. FINDINGS: Stable cardiomegaly and mediastinal contours. Stable left chest cardiac AICD. Stable large lung volumes. Pulmonary  vascularity is stable. No pneumothorax, pleural effusion or confluent pulmonary opacity. Visualized tracheal air column is within normal limits. No acute osseous abnormality identified. Abdominal Calcified aortic atherosclerosis. Negative visible bowel gas pattern. IMPRESSION: 1. Stable cardiomegaly and pulmonary vascularity. No acute cardiopulmonary abnormality. 2.  Aortic Atherosclerosis (ICD10-I70.0). Electronically Signed   By: Genevie Ann M.D.   On: 10/08/2018 23:47   Pertinent labs & imaging results that were available during my care of the patient were reviewed by me and considered in my medical decision making (see chart for details).  Medications Ordered in ED Medications  ipratropium-albuterol (DUONEB)  0.5-2.5 (3) MG/3ML nebulizer solution 3 mL (has no administration in time range)  predniSONE (DELTASONE) tablet 40 mg (has no administration in time range)  furosemide (LASIX) injection 60 mg (60 mg Intravenous Given 10/09/18 0415)  ipratropium-albuterol (DUONEB) 0.5-2.5 (3) MG/3ML nebulizer solution 3 mL (3 mLs Nebulization Given 10/09/18 0415)                                                                                                                                    Procedures Procedures CRITICAL CARE Performed by: Grayce Sessions  Total critical care time: 55 minutes Critical care time was exclusive of separately billable procedures and treating other patients. Critical care was necessary to treat or prevent imminent or life-threatening deterioration. Critical care was time spent personally by me on the following activities: development of treatment plan with patient and/or surrogate as well as nursing, discussions with consultants, evaluation of patient's response to treatment, examination of patient, obtaining history from patient or surrogate, ordering and performing treatments and interventions, ordering and review of laboratory studies, ordering and review of radiographic  studies, pulse oximetry and re-evaluation of patient's condition.   (including critical care time)  Medical Decision Making / ED Course I have reviewed the nursing notes for this encounter and the patient's prior records (if available in EHR or on provided paperwork).    Patient presents with several days of gradually worsening shortness of breath.  Patient endorses intermittent chest pain a several days ago typical of her prior chest pains.  Currently denies any chest pain at this time.  Reports that her shortness of breath is exacerbated with lying down and exertion.  States that she felt extremely anxious tonight and is unsure why.  There was no abrupt changes to her shortness of breath.  It appears that she has not been taking full doses of diuretics.  She is currently afebrile with stable vital signs.  Satting well on room air.  Lungs with decreased air movement throughout.  Patient does not appear to be overtly volume overloaded.  Chest x-ray revealed cardiomegaly with pulmonary vascular congestion but no pulmonary edema.  No evidence of pneumonia.  No pneumothorax.  EKG without acute ischemic changes or blocks.  Troponin negative.  Presentation was concerning for COPD exacerbation with possible superimposed CHF exacerbation.  Patient given IV diuretics as well as breathing treatments.  During her stay, patient was noted to desat into the mid 80s.  She was given additional breathing treatments and placed on oxygen.  We attempted to wean her off of oxygen and ambulate the patient but she decided to 76% on room air.  Patient will require admission for continued work-up and management.  Final Clinical Impression(s) / ED Diagnoses Final diagnoses:  COPD exacerbation ([place])  Acute on chronic combined systolic and diastolic congestive heart failure ([place])  Hypoxia      This chart was dictated using voice recognition software.  Despite best  efforts to proofread,  errors can occur which can  change the documentation meaning.   Fatima Blank, MD 10/09/18 [phone]

## 2018-10-09 NOTE — ED Notes (Signed)
PT ambulated in hall. PT only got few feet from door and got short of breath and very dizzy. PT returned to bed. Sats in the mid 7's upon returning

## 2018-10-09 NOTE — ED Notes (Signed)
Ordered diet tray for pt  

## 2018-10-10 DIAGNOSIS — I110 Hypertensive heart disease with heart failure: Secondary | ICD-10-CM | POA: Diagnosis not present

## 2018-10-10 DIAGNOSIS — I5043 Acute on chronic combined systolic (congestive) and diastolic (congestive) heart failure: Secondary | ICD-10-CM | POA: Diagnosis not present

## 2018-10-10 DIAGNOSIS — I428 Other cardiomyopathies: Secondary | ICD-10-CM

## 2018-10-10 DIAGNOSIS — E8582 Wild-type transthyretin-related (ATTR) amyloidosis: Secondary | ICD-10-CM | POA: Diagnosis not present

## 2018-10-10 LAB — BASIC METABOLIC PANEL
Anion gap: 10 (ref 5–15)
BUN: 21 mg/dL (ref 8–23)
CO2: 31 mmol/L (ref 22–32)
Calcium: 9.3 mg/dL (ref 8.9–10.3)
Chloride: 99 mmol/L (ref 98–111)
Creatinine, Ser: 0.99 mg/dL (ref 0.44–1.00)
GFR calc Af Amer: >60 mL/min (ref >60)
GFR, EST NON AFRICAN AMERICAN: 57 mL/min — AB (ref >60)
GLUCOSE: 174 mg/dL — AB (ref 70–99)
Potassium: 4.2 mmol/L (ref 3.5–5.1)
Sodium: 140 mmol/L (ref 135–145)

## 2018-10-10 LAB — GLUCOSE, CAPILLARY
Glucose-Capillary: 160 mg/dL — ABNORMAL HIGH (ref 70–99)
Glucose-Capillary: 223 mg/dL — ABNORMAL HIGH (ref 70–99)
Glucose-Capillary: 173 mg/dL — ABNORMAL HIGH (ref 70–99)
Glucose-Capillary: 193 mg/dL — ABNORMAL HIGH (ref 70–99)

## 2018-10-10 MED ORDER — INSULIN DETEMIR 100 UNIT/ML ~~LOC~~ SOLN
10.0000 [IU] | Freq: Every day | SUBCUTANEOUS | Status: DC
  Administered 2018-10-10 – 2018-10-12 (×3): 10 [IU] via SUBCUTANEOUS
  Filled 2018-10-10 (×3): qty 0.1

## 2018-10-10 MED ORDER — NON FORMULARY: 80.0000 mg | Freq: Every day | Status: DC

## 2018-10-10 MED ORDER — TAFAMIDIS MEGLUMINE (CARDIAC) 20 MG PO CAPS
80.0000 mg | ORAL_CAPSULE | Freq: Every day | ORAL | Status: DC
  Administered 2018-10-10 – 2018-10-12 (×3): 80 mg via ORAL
  Filled 2018-10-10 (×10): qty 4

## 2018-10-10 MED ORDER — TAFAMIDIS MEGLUMINE (CARDIAC) 20 MG PO CAPS
80.0000 mg | ORAL_CAPSULE | Freq: Every day | ORAL | Status: DC
  Filled 2018-10-10: qty 4

## 2018-10-10 NOTE — Progress Notes (Signed)
Pharmacy students rounding with IMTP/[place] service. Addressing question asked by Dr. Rebeca Alert regarding indication and mechanism of action of tafamidis meglumine (Vyndaquel). 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 Per the package insert, tafamidis is a selective stabilizer of transthyretin (TTR) which works by preventing dissociation of TTR by binding to thyroxine binding sites.   [employer] and Russella Dar, [employer]4 Pharmacy Students

## 2018-10-10 NOTE — Progress Notes (Signed)
Late entry: patient had 6 beats of vtach this morning around 1130. Message sent to attending via [place]. Patient was sitting in chair watching television and asymptomatic.

## 2018-10-10 NOTE — Progress Notes (Signed)
Decreased to 2lpm 

## 2018-10-10 NOTE — Progress Notes (Addendum)
   Subjective: No overnight events. Patient reports that her breathing feels improved today, but her feet still feel swollen. She also notes that she had a headache that resolved with tylenol and that her nerves are acting up and she feels shaky. She states she has been taking 40mg  torsemide at home instead of the 80mg  that she's prescribed because she's afraid it will hurt her kidneys. She just recently started the Tafamidis.   Objective:  Vital signs in last 24 hours: Vitals:   10/09/18 0945 10/09/18 1000 10/09/18 1109 10/09/18 1940  BP: 105/83 (!) 109/56 (!) 123/50 (!) 119/98  Pulse: 80 74 72 84  Resp: 19 16 18 20   Temp:   97.8 F (36.6 C) 98.3 F (36.8 C)  TempSrc:   Oral Oral  SpO2: 98% 96% 97% 100%  Weight:   87.6 kg   Height:   5\' 5"  (1.651 m)    Gen: sitting up comfortably in bed, no distress Pulm: on 2L [place], CTAB CV: RRR Ext: non-pitting edema in the BLE  Assessment/Plan:  Principal Problem:   Acute on chronic combined systolic and diastolic heart failure (HCC) Active Problems:   Peripheral vascular disease (HCC)   Cardiac defibrillator in place   Type 2 diabetes mellitus with vascular disease (HCC)   COPD (chronic obstructive pulmonary disease) (HCC)   Wild-type transthyretin-related (ATTR) amyloidosis (HCC)  Acute on chronic combined systolic and diastolic heart failure Output ~850 ml in the past 24 hours. Weight today is 192lbs, 193 on admission. Saturating at 100% on 2L [place]. JVD mildly distended on exam. Having good urinary output. She states she has not been taking her home diuretics as prescribed. Dry weight is around 184 lb.  -strict I/Os -daily weights -bmet -digoxin  -ambulate with pulse ox - IV 60mg  lasix  Transthyretin amyloidosis Followed by Dr. Aundra Dubin who reports a TcPYP scan that was strongly suggestive of transthyretin amyloidosis.  Concern for wild-type TTR amyloidosis.  Recently prescribed tafamidis, states she started this medication about a  month ago. Tafamidis is not on the pharmacy formulary. Patient was instructed that she can bring in her home medication and we can administer it if she would like to start taking it while she's in the hospital. -start tafamidis if patient brings in home supply  HTN Soft blood pressures.  Will hold losartan and carvedilol. Can restart if needed.  Uncontrolled Type II diabetes mellitus Neuropathy - Will start patient on levemir 10 units  - SSI - Lyrica  Dispo: Anticipated discharge in approximately 1-2 day(s).   Skylen Spiering N, DO 10/10/2018, 6:50 AM Pager: [phone]

## 2018-10-10 NOTE — Progress Notes (Signed)
  Date: 10/10/2018  Patient name: Lisa [name]  Medical record number: [id number]  Date of birth: 20-Jul-1946   I have seen and evaluated this patient and I have discussed the plan of care with the house staff. Please see their note for complete details. I concur with their findings with the following additions/corrections:   73 year old woman with nonischemic cardiomyopathy, HFrEF, likely due to transthyretin amyloidosis, admitted with heart failure exacerbation that appears to be related to medication nonadherence.  She reports her breathing is much improved after starting IV diuresis here.  Her weight is down 1 pound from yesterday, but still above her dry weight.  We will continue diuresis and reach out to her heart failure doctor, Dr. Aundra Dubin, as she reports she is scheduled to see him for an appointment tomorrow.  Lenice Pressman, M.D., Ph.D. 10/10/2018, 3:47 PM

## 2018-10-11 ENCOUNTER — Encounter (HOSPITAL_COMMUNITY): Payer: Medicare Other

## 2018-10-11 DIAGNOSIS — Z9851 Tubal ligation status: Secondary | ICD-10-CM | POA: Diagnosis not present

## 2018-10-11 DIAGNOSIS — E8582 Wild-type transthyretin-related (ATTR) amyloidosis: Secondary | ICD-10-CM | POA: Diagnosis present

## 2018-10-11 DIAGNOSIS — J9621 Acute and chronic respiratory failure with hypoxia: Secondary | ICD-10-CM | POA: Diagnosis present

## 2018-10-11 DIAGNOSIS — Z7982 Long term (current) use of aspirin: Secondary | ICD-10-CM | POA: Diagnosis not present

## 2018-10-11 DIAGNOSIS — Z7984 Long term (current) use of oral hypoglycemic drugs: Secondary | ICD-10-CM | POA: Diagnosis not present

## 2018-10-11 DIAGNOSIS — M170 Bilateral primary osteoarthritis of knee: Secondary | ICD-10-CM | POA: Diagnosis present

## 2018-10-11 DIAGNOSIS — J9601 Acute respiratory failure with hypoxia: Secondary | ICD-10-CM | POA: Diagnosis not present

## 2018-10-11 DIAGNOSIS — E8589 Other amyloidosis: Secondary | ICD-10-CM | POA: Diagnosis not present

## 2018-10-11 DIAGNOSIS — E1165 Type 2 diabetes mellitus with hyperglycemia: Secondary | ICD-10-CM | POA: Diagnosis present

## 2018-10-11 DIAGNOSIS — G40909 Epilepsy, unspecified, not intractable, without status epilepticus: Secondary | ICD-10-CM | POA: Diagnosis present

## 2018-10-11 DIAGNOSIS — I493 Ventricular premature depolarization: Secondary | ICD-10-CM | POA: Diagnosis present

## 2018-10-11 DIAGNOSIS — I2510 Atherosclerotic heart disease of native coronary artery without angina pectoris: Secondary | ICD-10-CM | POA: Diagnosis present

## 2018-10-11 DIAGNOSIS — E1140 Type 2 diabetes mellitus with diabetic neuropathy, unspecified: Secondary | ICD-10-CM | POA: Diagnosis present

## 2018-10-11 DIAGNOSIS — J441 Chronic obstructive pulmonary disease with (acute) exacerbation: Secondary | ICD-10-CM | POA: Diagnosis present

## 2018-10-11 DIAGNOSIS — Z9660 Presence of unspecified orthopedic joint implant: Secondary | ICD-10-CM | POA: Diagnosis present

## 2018-10-11 DIAGNOSIS — F419 Anxiety disorder, unspecified: Secondary | ICD-10-CM | POA: Diagnosis present

## 2018-10-11 DIAGNOSIS — R0902 Hypoxemia: Secondary | ICD-10-CM | POA: Diagnosis present

## 2018-10-11 DIAGNOSIS — E1151 Type 2 diabetes mellitus with diabetic peripheral angiopathy without gangrene: Secondary | ICD-10-CM | POA: Diagnosis present

## 2018-10-11 DIAGNOSIS — Z9119 Patient's noncompliance with other medical treatment and regimen: Secondary | ICD-10-CM | POA: Diagnosis not present

## 2018-10-11 DIAGNOSIS — I428 Other cardiomyopathies: Secondary | ICD-10-CM | POA: Diagnosis present

## 2018-10-11 DIAGNOSIS — E785 Hyperlipidemia, unspecified: Secondary | ICD-10-CM | POA: Diagnosis present

## 2018-10-11 DIAGNOSIS — Z8701 Personal history of pneumonia (recurrent): Secondary | ICD-10-CM | POA: Diagnosis not present

## 2018-10-11 DIAGNOSIS — Z9114 Patient's other noncompliance with medication regimen: Secondary | ICD-10-CM | POA: Diagnosis not present

## 2018-10-11 DIAGNOSIS — M4690 Unspecified inflammatory spondylopathy, site unspecified: Secondary | ICD-10-CM | POA: Diagnosis present

## 2018-10-11 DIAGNOSIS — K219 Gastro-esophageal reflux disease without esophagitis: Secondary | ICD-10-CM | POA: Diagnosis present

## 2018-10-11 DIAGNOSIS — I5043 Acute on chronic combined systolic (congestive) and diastolic (congestive) heart failure: Secondary | ICD-10-CM | POA: Diagnosis present

## 2018-10-11 DIAGNOSIS — I110 Hypertensive heart disease with heart failure: Secondary | ICD-10-CM | POA: Diagnosis present

## 2018-10-11 LAB — CUP PACEART REMOTE DEVICE CHECK
Date Time Interrogation Session: 20200117073449
Implantable Lead Implant Date: 20140812
Implantable Lead Implant Date: 20140812
Implantable Lead Location: 753859
Implantable Lead Location: 753860
Implantable Lead Model: 375
Implantable Lead Serial Number: 10491693
Implantable Pulse Generator Implant Date: 20140812
MDC IDC LEAD SERIAL: 29451060
Pulse Gen Model: 383562
Pulse Gen Serial Number: 60721404

## 2018-10-11 LAB — GLUCOSE, CAPILLARY
GLUCOSE-CAPILLARY: 240 mg/dL — AB (ref 70–99)
Glucose-Capillary: 165 mg/dL — ABNORMAL HIGH (ref 70–99)
Glucose-Capillary: 259 mg/dL — ABNORMAL HIGH (ref 70–99)
Glucose-Capillary: 262 mg/dL — ABNORMAL HIGH (ref 70–99)

## 2018-10-11 LAB — BASIC METABOLIC PANEL
Anion gap: 10 (ref 5–15)
BUN: 23 mg/dL (ref 8–23)
CHLORIDE: 97 mmol/L — AB (ref 98–111)
CO2: 31 mmol/L (ref 22–32)
Calcium: 9.2 mg/dL (ref 8.9–10.3)
Creatinine, Ser: 1.04 mg/dL — ABNORMAL HIGH (ref 0.44–1.00)
GFR calc Af Amer: >60 mL/min (ref >60)
GFR calc non Af Amer: 54 mL/min — ABNORMAL LOW (ref >60)
Glucose, Bld: 205 mg/dL — ABNORMAL HIGH (ref 70–99)
Potassium: 4.6 mmol/L (ref 3.5–5.1)
Sodium: 138 mmol/L (ref 135–145)

## 2018-10-11 MED ORDER — FUROSEMIDE 10 MG/ML IJ SOLN
60.0000 mg | Freq: Two times a day (BID) | INTRAMUSCULAR | Status: DC
  Administered 2018-10-11 – 2018-10-13 (×5): 60 mg via INTRAVENOUS
  Filled 2018-10-11 (×5): qty 6

## 2018-10-11 MED ORDER — RAMELTEON 8 MG PO TABS
8.0000 mg | ORAL_TABLET | Freq: Every day | ORAL | Status: DC
  Administered 2018-10-11 – 2018-10-12 (×3): 8 mg via ORAL
  Filled 2018-10-11 (×3): qty 1

## 2018-10-11 NOTE — Progress Notes (Signed)
  Date: 10/11/2018  Patient name: Lisa [name]  Medical record number: [id number]  Date of birth: 1946-04-15   I have seen and evaluated this patient and I have discussed the plan of care with the house staff. Please see their note for complete details. I concur with their findings with the following additions/corrections:   73 year old woman admitted for acute on chronic HFrEF due to nonischemic cardiomyopathy, likely transthyretin amyloidosis.  She has been diuresing well, although weight recorded is increased today from yesterday.  Legs remain swollen, JVP is only mildly elevated, lungs are clear.  We will continue diuresis for another day, hopefully she will be closer to dry weight and euvolemic tomorrow.  She would like to go home as soon as possible.  Lenice Pressman, M.D., Ph.D. 10/11/2018, 1:32 PM

## 2018-10-11 NOTE — Progress Notes (Addendum)
Inpatient Diabetes Program Recommendations  AACE/ADA: New Consensus Statement on Inpatient Glycemic Control (2015)  Target Ranges:  Prepandial:   less than 140 mg/dL      Peak postprandial:   less than 180 mg/dL (1-2 hours)      Critically ill patients:  140 - 180 mg/dL   Lab Results  Component Value Date   GLUCAP 240 (H) 10/11/2018   HGBA1C 7.0 (H) 03/19/2018    Review of Glycemic Control Results for Lisa [name], Lisa [name] (MRN [id number]) as of 10/11/2018 13:25  Ref. Range 10/10/2018 21:52 10/11/2018 07:25 10/11/2018 11:31  Glucose-Capillary Latest Ref Range: 70 - 99 mg/dL 193 (H) 165 (H) 240 (H)   Diabetes history: Type 2 DM Outpatient Diabetes medications: Amaryl 2 mg QD, Metformin 500 mg BID Current orders for Inpatient glycemic control: Novolog 0-15 units TID, Novolog 0-5 units QHS, Levemir 10 units QHS  Inpatient Diabetes Program Recommendations:    If continues to remain inpatient and if postprandials continue to exceed 180 mg/dL, consider adding Novolog 3 units TID (assuming that patient is consuming >50% of meal). Strongly encourage outpatient follow up for diabetes management, given inpatient trends and previous A1C was last resulted in 02/2018.   Thanks, Bronson Curb, MSN, RNC-OB Diabetes Coordinator [phone] (8a-5p)

## 2018-10-11 NOTE — Care Management CC44 (Signed)
Condition Code 44 Documentation Completed  Patient Details  Name: Lisa [name] MRN: [id number] Date of Birth: August 09, 1946   Condition Code 44 given:  Yes Patient signature on Condition Code 44 notice:  Yes Documentation of 2 MD's agreement:  Yes Code 44 added to claim:  Yes    Royston Bake, RN 10/11/2018, 11:07 AM

## 2018-10-11 NOTE — Care Management Note (Signed)
Case Management Note  Patient Details  Name: Lisa [name] MRN: [id number] Date of Birth: 04/26/1946  Subjective/Objective:  CHF                 Action/Plan: Patient lives at home, continues to drive; [name], MD; has private insurance with [facility] with prescription; pharmacy of choice [facility]; patient is requesting a walker at discharge; she cooks a heart healthy die but states that she is trying to decrease her salt intake; she has scales and knows to weigh herself daily. Patient is upset at this time because her sister in the ICU, not doing well; CM will continue to follow for progression of care.  Expected Discharge Date:    possibly 10/15/2018              Expected Discharge Plan:  Home/Self Care  Discharge planning Services  CM Consult    Status of Service:  In process, will continue to follow  [name] [phone] 10/11/2018, 11:26 AM

## 2018-10-11 NOTE — Progress Notes (Signed)
Patient had a few seconds of chest pressure. Pt reports no SOB, dizziness, or nausea. IMTS was paged and placed no new orders at this time. Stated to keep monitoring the pt.

## 2018-10-11 NOTE — Progress Notes (Signed)
Evaluated at bedside after brief episode of chest pain. Patient was sitting on edge of bed when I walked into the room. She did not appear to be in any distress and reported complete resolution of her chest pain. She stated that she was sitting on the edge of her bed when she experienced sudden onset substernal "pressure." This last a few seconds a resolved without intervention. She denies associated sob, radiation of pain, paresthesias, n/v/diaphoresis. She had eaten several hours earlier. Denies previous chest pains of this nature. Admitted to feeling very anxious, stating she does not want to be in the hospital.     73F, HR 91, RR 18, BP 125/66, O2 94% RA Gen: sitting on EOB, NAD Cardiac: RRR, no m/r/g, chest wall is nontender to palpation Pulm: CTAB  A/P: Atypical chest pain. Doubt ACS. Pain is not reproducible on exam, doubt MSK. Most likely provoked by anxiety. Advised patient to notify nurse if symptoms returned.  Francesco Runner, MD Internal Medicine, PGY1 Pager: [phone] 10/11/2018, 10:31 PM

## 2018-10-11 NOTE — Progress Notes (Signed)
   Subjective: No overnight events. She reports that her feet feel swollen and that she does not sleep well in the hospital. Her breathing feels better and she's on room air. She tells the team that her sister had two heart attacks yesterday and is admitted to the ICU at this hospital. The nursing staff helped her visit her sister. She was hoping to go home today, but she is amenable to staying until her volume status improves.  Objective:  Vital signs in last 24 hours: Vitals:   10/10/18 0735 10/10/18 1447 10/10/18 2151 10/11/18 0456  BP:  (!) 114/58 120/65 127/78  Pulse:  60 86 88  Resp:   20 20  Temp:  98.5 F (36.9 C) 98 F (36.7 C) 98 F (36.7 C)  TempSrc:  Oral Oral Oral  SpO2: 100% 93% 93% 98%  Weight:    88.3 kg  Height:       Gen: Sitting comfortably in a chair, no distress CV: RRR Pulm: on RA, CTAB Ext: 2+ pitting edema BLE, seems worse than yesterday  Neck: +JVD   Assessment/Plan:  Principal Problem:   Acute on chronic combined systolic and diastolic heart failure (HCC) Active Problems:   Peripheral vascular disease (HCC)   Cardiac defibrillator in place   Type 2 diabetes mellitus with vascular disease (HCC)   COPD (chronic obstructive pulmonary disease) (HCC)   Wild-type transthyretin-related (ATTR) amyloidosis (HCC)  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 Weight paradoxically increased to 194lbs, 192 yesterday. Saturating at 100% on room air. Dry weight is around 184 lb.  -strict I/Os -daily weights -bmet -digoxin  -ambulate with pulse ox - IV 60mg  lasix BID  Transthyretin amyloidosis - c/w home medication Tafamidis 80 mg   HTN - 123/71 - holding losartan and carvedilol, can restart if needed  Uncontrolled Type II diabetes mellitus Neuropathy - Am glucose 165 - Levemir 10 units - SSI - Lyrica  Dispo: Anticipated discharge in approximately 1-2 day(s).   Rehman, Areeg N,  DO 10/11/2018, 6:50 AM Pager: [phone]

## 2018-10-11 NOTE — Care Management Obs Status (Signed)
[facility] NOTIFICATION   Patient Details  Name: Lisa [name] MRN: [id number] Date of Birth: [birth year]   Medicare Observation Status Notification Given:  Yes    Royston Bake, RN 10/11/2018, 11:07 AM

## 2018-10-12 LAB — GLUCOSE, CAPILLARY
Glucose-Capillary: 190 mg/dL — ABNORMAL HIGH (ref 70–99)
Glucose-Capillary: 301 mg/dL — ABNORMAL HIGH (ref 70–99)
Glucose-Capillary: 171 mg/dL — ABNORMAL HIGH (ref 70–99)
Glucose-Capillary: 315 mg/dL — ABNORMAL HIGH (ref 70–99)

## 2018-10-12 LAB — BASIC METABOLIC PANEL
Anion gap: 14 (ref 5–15)
BUN: 19 mg/dL (ref 8–23)
CO2: 28 mmol/L (ref 22–32)
Calcium: 9 mg/dL (ref 8.9–10.3)
Chloride: 95 mmol/L — ABNORMAL LOW (ref 98–111)
Creatinine, Ser: 0.89 mg/dL (ref 0.44–1.00)
GFR calc Af Amer: >60 mL/min (ref >60)
GFR calc non Af Amer: >60 mL/min (ref >60)
Glucose, Bld: 167 mg/dL — ABNORMAL HIGH (ref 70–99)
Potassium: 4 mmol/L (ref 3.5–5.1)
Sodium: 137 mmol/L (ref 135–145)

## 2018-10-12 MED ORDER — INSULIN ASPART 100 UNIT/ML ~~LOC~~ SOLN
3.0000 [IU] | Freq: Three times a day (TID) | SUBCUTANEOUS | Status: DC
  Administered 2018-10-12 – 2018-10-13 (×3): 3 [IU] via SUBCUTANEOUS

## 2018-10-12 MED ORDER — DULOXETINE HCL 60 MG PO CPEP
60.0000 mg | ORAL_CAPSULE | Freq: Every day | ORAL | Status: DC
  Administered 2018-10-12 – 2018-10-13 (×2): 60 mg via ORAL
  Filled 2018-10-12 (×2): qty 1

## 2018-10-12 NOTE — Progress Notes (Signed)
CN returned to room with pt Primary RN aware

## 2018-10-12 NOTE — Progress Notes (Signed)
Pt requesting to go back to 32[facility], primary RN aware and stated it is okay Transported pt via wheelchair with CN

## 2018-10-12 NOTE — Progress Notes (Signed)
Informed pharmacy of needing dose for Trileptal, asked to adjust administration times, pharmacy technician aware

## 2018-10-12 NOTE — Progress Notes (Signed)
CN returned patient to room.

## 2018-10-12 NOTE — Progress Notes (Signed)
   Subjective: Patient feels much better this morning compared to yesterday evening. She did experience chest pain last night that she feels is due to anxiety. She states that she has always been an anxious person but nobody has ever given her medications for this. She does have some tenderness to palpation of the chest wall. Discussed that her weight is below her last outpatient cardiology weight but this is likely not her true dry weight as she still has significant LE edema. She is anxious to leave the hospital. I recommended she stay for at least another 1-2 days. She will talk with her family and let me know. All questions and concerns addressed.   Objective: Vital signs in last 24 hours: Vitals:   10/11/18 0846 10/11/18 1235 10/11/18 1923 10/12/18 0541  BP:  (!) 130/58 125/66   Pulse: 85 (!) 59 91   Resp: 16 18 18    Temp:  98 F (36.7 C) 98 F (36.7 C)   TempSrc:  Oral Oral   SpO2: 90% 92% 94%   Weight:    86.3 kg  Height:       Gen: Morbidly obese female in no acute distress CV: RRR, no murmurs, no rubs Ext: Moderate pitting edema of the bilateral LEs  Assessment/Plan:  Lisa [name] is a 73 y.o female with combined systolic and diastolic heart failure who presented to the ED with signs and symptoms of volume overload with acute hypoxic respiratory failure. She was not taking her diuretics as prescribed and noted an increase in her weight. She was subsequently admitted for an acute of chronic heart failure exacerbation.   Acute Hypoxic Respiratory Failure  Acute on chronic combined systolic and diastolic heart failure Transthyretin amyloidosis - Symptoms have improved but continues to have LE edema  - Now on RA - Net negative 840 mL over past 24 hours with total diuresis of 3.4L since admission. Weight down 3Lbs since admission. Current weight is 86.3kg.  - BMP stable - Telemetry reviewed. NSR with PVCs  - Continue IV furosemide 60 mg BID  - Continue Tafamidis 80 mg    Atypical Chest Pain  - Brief episode of CP overnight  - Tenderness to palpation of the chest wall  - Continue to monitor   HTN - BP at goal  - Currently holding losartan and carvedilol, can restart if needed  Uncontrolled Type II diabetes mellitus Neuropathy - CBGs remain above goal  -Continue Levemir 10 units and SSI - Will add novolog 3 units TID with meals  - Continue lyrica, will add duloxetine   Dispo: Anticipated discharge in approximately 1-2 day(s).   Ina Homes, MD 10/12/2018, 7:59 AM

## 2018-10-12 NOTE — Progress Notes (Signed)
Medicine attending: Clinical status and lab database reviewed with resident physician Dr. Ina Homes and I concur with his evaluation and management plan. She continues to improve.  Hospital weights erratic.  Persistent lower extremity edema.  We will continue IV diuretics for another day before transitioning back to p.o. 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  Recently started on Tafamidis in attempt to slow down progression.

## 2018-10-12 NOTE — Progress Notes (Signed)
Ordered pt TED hose

## 2018-10-12 NOTE — Progress Notes (Addendum)
MD [facility] gave verbal order that pt can be off unit to see family member on 664M  Pt cardiac telemetry discontinued   CN transported pt via wheelchair to 664[facility]  Primary RN aware

## 2018-10-13 DIAGNOSIS — Z6831 Body mass index (BMI) 31.0-31.9, adult: Secondary | ICD-10-CM

## 2018-10-13 DIAGNOSIS — Z9114 Patient's other noncompliance with medication regimen: Secondary | ICD-10-CM

## 2018-10-13 DIAGNOSIS — E1140 Type 2 diabetes mellitus with diabetic neuropathy, unspecified: Secondary | ICD-10-CM

## 2018-10-13 DIAGNOSIS — J9601 Acute respiratory failure with hypoxia: Secondary | ICD-10-CM

## 2018-10-13 DIAGNOSIS — I110 Hypertensive heart disease with heart failure: Principal | ICD-10-CM

## 2018-10-13 DIAGNOSIS — E8589 Other amyloidosis: Secondary | ICD-10-CM

## 2018-10-13 LAB — GLUCOSE, CAPILLARY: Glucose-Capillary: 177 mg/dL — ABNORMAL HIGH (ref 70–99)

## 2018-10-13 LAB — BASIC METABOLIC PANEL
ANION GAP: 12 (ref 5–15)
BUN: 24 mg/dL — ABNORMAL HIGH (ref 8–23)
CO2: 31 mmol/L (ref 22–32)
Calcium: 8.9 mg/dL (ref 8.9–10.3)
Chloride: 94 mmol/L — ABNORMAL LOW (ref 98–111)
Creatinine, Ser: 0.93 mg/dL (ref 0.44–1.00)
GFR calc Af Amer: >60 mL/min (ref >60)
GFR calc non Af Amer: >60 mL/min (ref >60)
Glucose, Bld: 179 mg/dL — ABNORMAL HIGH (ref 70–99)
Potassium: 4.2 mmol/L (ref 3.5–5.1)
Sodium: 137 mmol/L (ref 135–145)

## 2018-10-13 MED ORDER — DULOXETINE HCL 60 MG PO CPEP: 60.0000 mg | ORAL_CAPSULE | Freq: Every day | ORAL | 0 refills | Status: DC

## 2018-10-13 MED ORDER — TORSEMIDE 20 MG PO TABS: 40.0000 mg | ORAL_TABLET | Freq: Two times a day (BID) | ORAL | 0 refills | Status: DC

## 2018-10-13 NOTE — Discharge Summary (Signed)
Name: Lisa [name] MRN: [id number] DOB: [birth year] 73 y.o. PCP: [name], MD  Date of Admission: 10/08/2018 11:12 PM Date of Discharge: 10/13/2018 Attending Physician: Annia Belt, MD  Discharge Diagnosis:  Acute Hypoxic Respiratory Failure secondary to an Acute on Chronic Combined Systolic/Diastolic Heart Failure Exacerbation.   Discharge Medications: Allergies as of 10/13/2018      Reactions   Oxycodone Shortness Of Breath   Entresto [sacubitril-valsartan] Other (See Comments)   Impaired coordination---causes patient to drop many things, blood pressure bottomed out      Medication List    TAKE these medications   albuterol (2.5 MG/3ML) 0.083% nebulizer solution Commonly known as:  PROVENTIL Take 3 mLs by nebulization every 4 (four) hours as needed for wheezing.   albuterol 108 (90 Base) MCG/ACT inhaler Commonly known as:  PROVENTIL HFA;VENTOLIN HFA Inhale 2 puffs into the lungs every 6 (six) hours as needed for wheezing or shortness of breath.   allopurinol 100 MG tablet Commonly known as:  ZYLOPRIM Take 100 mg by mouth daily.   aspirin EC 81 MG tablet Take 162 mg by mouth daily.   atorvastatin 20 MG tablet Commonly known as:  LIPITOR TAKE 1 TABLET BY MOUTH EVERY DAY What changed:  when to take this   B-12 1000 MCG/ML Kit Inject 1,000 mg as directed every 30 (thirty) days.   BREO ELLIPTA 200-25 MCG/INH Aepb Generic drug:  fluticasone furoate-vilanterol Inhale 1 puff into the lungs daily.   carvedilol 12.5 MG tablet Commonly known as:  COREG Take 1 tablet (12.5 mg total) by mouth 2 (two) times daily with a meal.   digoxin 0.125 MG tablet Commonly known as:  DIGOX Take 0.5 tablets (0.0625 mg total) by mouth daily.   DULoxetine 60 MG capsule Commonly known as:  CYMBALTA Take 1 capsule (60 mg total) by mouth daily.   glimepiride 2 MG tablet Commonly known as:  AMARYL Take 2 mg by mouth daily.   levothyroxine 25 MCG tablet Commonly  known as:  SYNTHROID, LEVOTHROID Take 25 mcg by mouth daily before breakfast.   losartan 25 MG tablet Commonly known as:  COZAAR Take 1 tablet (25 mg total) by mouth at bedtime.   metFORMIN 500 MG 24 hr tablet Commonly known as:  GLUCOPHAGE-XR Take 500 mg by mouth 2 (two) times daily.   metolazone 2.5 MG tablet Commonly known as:  ZAROXOLYN Take 1 tablet (2.5 mg total) by mouth as needed.   OXcarbazepine 150 MG tablet Commonly known as:  TRILEPTAL Take 150 mg by mouth 3 (three) times daily.   pregabalin 150 MG capsule Commonly known as:  LYRICA Take 150 mg by mouth 2 (two) times daily.   Tafamidis Meglumine (Cardiac) 20 MG Caps Take 20 mg by mouth 4 (four) times daily. What changed:    how much to take  when to take this   torsemide 20 MG tablet Commonly known as:  DEMADEX Take 2 tablets (40 mg total) by mouth 2 (two) times daily. What changed:  when to take this            Durable Medical Equipment  (From admission, onward)         Start     Ordered   10/11/18 1131  For home use only DME Walker rolling  Once    Question:  Patient needs a walker to treat with the following condition  Answer:  CHF (congestive heart failure) ([place])   10/11/18 1130  Disposition and follow-up:   Lisa [name] was discharged from [facility] in Stable condition.  At the hospital follow up visit please address:  1.  HF. Ensure she is continuing to follow-up with cardiology and taking her medications as prescribed. Continue to discuss lifestyle modification. Discuss a HF action plan with the patient.   2.  Labs / imaging needed at time of follow-up: None  3.  Pending labs/ test needing follow-up: None  Follow-up Appointments: Follow-up Information    [name], MD. Schedule an appointment as soon as possible for a visit.   Specialty:  Internal Medicine Contact information: [address] [place]  [address] [phone]        Constance Haw, MD .   Specialty:  Cardiology Contact information: [place] [address] [phone]        Larey Dresser, MD .   Specialty:  Cardiology Contact information: [place] [address] [place] Hospital Course by problem list:  Acute Hypoxic Respiratory Failure secondary to an Acute on Chronic Combined Systolic/Diastolic Heart Failure Exacerbation. Lisa [name] is a 73 y.o female with combined systolic and diastolic heart failure who presented to the ED with signs and symptoms of volume overload with acute hypoxic respiratory failure. She was not taking her diuretics as prescribed and noted an increase in her weight. She was subsequently admitted for an acute of chronic heart failure exacerbation. She was diuresed with IV furosemide 60 mg BID and over the course of her hospitalization was able to come off the oxygen. Her weight decreased back to her reported dry weight of 86-87kg. She LE edema significantly improved however was 1+ on discharge. A heart failure action plan was discussed and all her home medications were continued as prescribed on discharge. She will follow-up with her PCP and cardiologist within the next 7-10 days.   Discharge Vitals:   BP 107/86   Pulse 91   Temp 98.7 F (37.1 C) (Oral)   Resp 18   Ht 5' 5"  (1.651 m)   Wt 86.7 kg   SpO2 95%   BMI 31.80 kg/m   Pertinent Labs, Studies, and Procedures:  BMP Latest Ref Rng & Units 10/13/2018 10/12/2018 10/11/2018  Glucose 70 - 99 mg/dL 179(H) 167(H) 205(H)  BUN 8 - 23 mg/dL 24(H) 19 23  Creatinine 0.44 - 1.00 mg/dL 0.93 0.89 1.04(H)  BUN/Creat Ratio 12 - 28 - - -  Sodium 135 - 145 mmol/L 137 137 138  Potassium 3.5 - 5.1 mmol/L 4.2 4.0 4.6  Chloride 98 - 111 mmol/L 94(L) 95(L) 97(L)  CO2 22 - 32 mmol/L 31 28 31   Calcium 8.9 - 10.3 mg/dL 8.9 9.0 9.2   CBC Latest Ref Rng & Units 10/08/2018 07/27/2018 07/26/2018  WBC  4.0 - 10.5 K/uL 5.6 - 5.0  Hemoglobin 12.0 - 15.0 g/dL 12.5 - 13.0  Hematocrit 36.0 - 46.0 % 39.6 37.1 42.4  Platelets 150 - 400 K/uL 302 - 232   Discharge Instructions: Discharge Instructions    (HEART FAILURE PATIENTS) Call MD:  Anytime you have any of the following symptoms: 1) 3 pound weight gain in 24 hours or 5 pounds in 1 week 2) shortness of breath, with or without a dry hacking cough 3) swelling in the hands, feet or stomach 4) if you have to sleep on extra pillows at night in order to breathe.  Complete by:  As directed    Diet - low sodium heart healthy   Complete by:  As directed    Heart Failure patients record your daily weight using the same scale at the same time of day   Complete by:  As directed    Increase activity slowly   Complete by:  As directed     Signed: Ina Homes, MD 10/13/2018, 10:24 AM

## 2018-10-13 NOTE — Care Management (Signed)
RW for home at bedside. No other CM needs

## 2018-10-13 NOTE — Progress Notes (Signed)
   Subjective: No overnight events. She thinks her urine output has been slowing down a lot. She think she's around her usual weight. She denies dyspnea. All of her questions were addressed.  Objective: Vital signs in last 24 hours: Vitals:   10/12/18 0932 10/12/18 1156 10/12/18 1932 10/13/18 0426  BP: 102/74 120/76 124/73   Pulse: 97 90 92   Resp:  20 18   Temp:   98.7 F (37.1 C)   TempSrc:   Oral   SpO2: 95% 95% 94%   Weight:    86.7 kg  Height:       Gen: Morbidly obese female in no acute distress CV: RRR, no murmurs, no rubs Pulm: CTAB, normal effort, on room air Ext: Moderate pitting edema of the bilateral LEs  Assessment/Plan:  Lisa [name] is a 73 y.o female with combined systolic and diastolic heart failure who presented to the ED with signs and symptoms of volume overload with acute hypoxic respiratory failure. She was not taking her diuretics as prescribed and noted an increase in her weight. She was subsequently admitted for an acute of chronic heart failure exacerbation.   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 Current weight is 86.7kg.  - BMP stable - Transition back to Torsemide 40 mg BID - Continue Tafamidis 80 mg   HTN - BP at goal  - Currently holding losartan and carvedilol, can restart if needed  Uncontrolled Type II diabetes mellitus Neuropathy - CBGs remain above goal  -Increase Levemir to 14 units  - Continue SSI and novolog 3 units TID with meals  - Continue lyrica and duloxetine   Atypical Chest Pain. Resolved  Dispo: Anticipated discharge today.   Ina Homes, MD 10/13/2018, 6:33 AM

## 2018-10-16 ENCOUNTER — Other Ambulatory Visit (HOSPITAL_COMMUNITY): Payer: Self-pay

## 2018-10-19 ENCOUNTER — Other Ambulatory Visit (HOSPITAL_COMMUNITY): Payer: Self-pay | Admitting: Cardiology

## 2018-10-22 ENCOUNTER — Telehealth (HOSPITAL_COMMUNITY): Payer: Self-pay

## 2018-10-22 NOTE — Telephone Encounter (Signed)
Pt called and stated that she was given cymbalta 60 mg while in the hospital and pt wants to know if it is safe to take medication with her current heart medications. Please advise.

## 2018-11-04 ENCOUNTER — Ambulatory Visit (HOSPITAL_COMMUNITY)
Admission: RE | Admit: 2018-11-04 | Discharge: 2018-11-04 | Disposition: A | Discharge status: Home / Self Care | Payer: Medicare Other | Source: Ambulatory Visit | Attending: Cardiology | Admitting: Cardiology

## 2018-11-04 ENCOUNTER — Telehealth (HOSPITAL_COMMUNITY): Payer: Self-pay | Admitting: Cardiology

## 2018-11-04 ENCOUNTER — Encounter (HOSPITAL_COMMUNITY): Payer: Self-pay

## 2018-11-04 VITALS — BP 132/86 | HR 104 | Wt 180.0 lb

## 2018-11-04 DIAGNOSIS — Z79899 Other long term (current) drug therapy: Secondary | ICD-10-CM | POA: Diagnosis not present

## 2018-11-04 DIAGNOSIS — I5022 Chronic systolic (congestive) heart failure: Secondary | ICD-10-CM | POA: Insufficient documentation

## 2018-11-04 DIAGNOSIS — R2681 Unsteadiness on feet: Secondary | ICD-10-CM

## 2018-11-04 DIAGNOSIS — M109 Gout, unspecified: Secondary | ICD-10-CM | POA: Insufficient documentation

## 2018-11-04 DIAGNOSIS — I6529 Occlusion and stenosis of unspecified carotid artery: Secondary | ICD-10-CM | POA: Diagnosis not present

## 2018-11-04 DIAGNOSIS — Z96653 Presence of artificial knee joint, bilateral: Secondary | ICD-10-CM | POA: Insufficient documentation

## 2018-11-04 DIAGNOSIS — I739 Peripheral vascular disease, unspecified: Secondary | ICD-10-CM | POA: Diagnosis not present

## 2018-11-04 DIAGNOSIS — E1142 Type 2 diabetes mellitus with diabetic polyneuropathy: Secondary | ICD-10-CM | POA: Insufficient documentation

## 2018-11-04 DIAGNOSIS — I429 Cardiomyopathy, unspecified: Secondary | ICD-10-CM

## 2018-11-04 DIAGNOSIS — R42 Dizziness and giddiness: Secondary | ICD-10-CM | POA: Diagnosis not present

## 2018-11-04 DIAGNOSIS — I428 Other cardiomyopathies: Secondary | ICD-10-CM | POA: Insufficient documentation

## 2018-11-04 DIAGNOSIS — I43 Cardiomyopathy in diseases classified elsewhere: Secondary | ICD-10-CM

## 2018-11-04 DIAGNOSIS — Z8249 Family history of ischemic heart disease and other diseases of the circulatory system: Secondary | ICD-10-CM | POA: Insufficient documentation

## 2018-11-04 DIAGNOSIS — M1990 Unspecified osteoarthritis, unspecified site: Secondary | ICD-10-CM | POA: Insufficient documentation

## 2018-11-04 DIAGNOSIS — J449 Chronic obstructive pulmonary disease, unspecified: Secondary | ICD-10-CM | POA: Diagnosis not present

## 2018-11-04 DIAGNOSIS — Z87891 Personal history of nicotine dependence: Secondary | ICD-10-CM | POA: Diagnosis not present

## 2018-11-04 DIAGNOSIS — E785 Hyperlipidemia, unspecified: Secondary | ICD-10-CM | POA: Diagnosis not present

## 2018-11-04 DIAGNOSIS — Z7982 Long term (current) use of aspirin: Secondary | ICD-10-CM | POA: Insufficient documentation

## 2018-11-04 DIAGNOSIS — Z95810 Presence of automatic (implantable) cardiac defibrillator: Secondary | ICD-10-CM | POA: Diagnosis not present

## 2018-11-04 DIAGNOSIS — I2510 Atherosclerotic heart disease of native coronary artery without angina pectoris: Secondary | ICD-10-CM | POA: Insufficient documentation

## 2018-11-04 DIAGNOSIS — E854 Organ-limited amyloidosis: Secondary | ICD-10-CM | POA: Diagnosis not present

## 2018-11-04 DIAGNOSIS — Z7984 Long term (current) use of oral hypoglycemic drugs: Secondary | ICD-10-CM | POA: Insufficient documentation

## 2018-11-04 LAB — BASIC METABOLIC PANEL
Anion gap: 15 (ref 5–15)
BUN: 77 mg/dL — ABNORMAL HIGH (ref 8–23)
CO2: 30 mmol/L (ref 22–32)
Calcium: 9.8 mg/dL (ref 8.9–10.3)
Chloride: 89 mmol/L — ABNORMAL LOW (ref 98–111)
Creatinine, Ser: 1.67 mg/dL — ABNORMAL HIGH (ref 0.44–1.00)
GFR calc Af Amer: 35 mL/min — ABNORMAL LOW (ref >60)
GFR calc non Af Amer: 30 mL/min — ABNORMAL LOW (ref >60)
Glucose, Bld: 240 mg/dL — ABNORMAL HIGH (ref 70–99)
POTASSIUM: 3.7 mmol/L (ref 3.5–5.1)
Sodium: 134 mmol/L — ABNORMAL LOW (ref 135–145)

## 2018-11-04 LAB — MAGNESIUM: Magnesium: 2.1 mg/dL (ref 1.7–2.4)

## 2018-11-04 MED ORDER — TORSEMIDE 20 MG PO TABS: 60.0000 mg | ORAL_TABLET | Freq: Every day | ORAL | 1 refills | Status: DC

## 2018-11-04 NOTE — Progress Notes (Signed)
Advanced Heart Failure Clinic Note  PCP: Dr. Loistine Simas Cardiology: Dr. Geraldo Pitter HF Cardiology: Dr. Abram Sander is a 73 y.o. female with history of PAD, COPD, carotid stenosis, and chronic systolic CHF was referred by Dr. Geraldo Pitter for evaluation/treatment of CHF.    Patient has a long history of vascular disease.  She had left-right fem-fem bypass in 3/16. She has moderate bilateral carotid stenosis.  Echo in 2/18 showed EF 20-25%.  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  She was admitted in 9/18 to the hospital in [facility] for about 4 days with acute on chronic systolic CHF.    RHC/LHC in 12/18 showed nonobstructive coronary disease, low filling pressures, and CI 2.0.   Admitted 09/20/17 with volume overload. Diuresed with IV lasix. Weight went down 2 pounds. Discharged on torsemide 40 mg daily. Discharge weight 179 pounds.   Echo in 12/18 with EF 20-25%, mild basal ventricular hypertrophy, severe mid to apical LV hypertrophy.  Repeat echo in 2/19 was done with Definity contrast, showing that what was thought to be apical hypertrophy was actually prominent mid-apical trabeculation suggestive of noncompaction.   She was noted to have Bence-Jones protein on UPEP.  However, SPEP was negative and 24 hours UPEP was also negative. Abdominal fat pad biopsy did not show evidence of amyloidosis.  She saw heme-onc, probably does not have AL amyloidosis.  However, in 2/19, she had TcPYP scan that was strongly suggestive of transthyretin amyloidosis. She has peripheral neuropathy that may be due to amyloidosis as well. 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   Admitted 6/24-6/26/19 for LLL PNA. She received IV abx and prednisone. Transitioned to omnicef 300 mg BID and azirthromycin 250 mg daily. BP medications were held, but restarted at discharge.    She was admitted 11/1-11/3/19 with dizziness. She also had intermittent CP and SOB with lying  flat x1 week. She had AKI with creatinine up to 1.9. She was also having ataxia and unsteady gait. The hospitalist discussed with neuro. Head CT negative. Losartan, spiro, and torsemide were stopped with concerns for hypotension. Repeat echo showed EF 30-35%. Troponins were flat. PT was consulted and recommended [facility] PT for vestibular exercises, but it doesn't look like this was set up. Of note, she was also being treated for UTI prior to admission.  Admitted 1/14 - 9/93/7169 with A/C diastolic CHF. Diuresed with IV lasix -> torsemide increased on discharge.   She presents today for post hospital follow up. Torsemide increased. Weight down 11 lbs. Breathing and edema are better on increased dose of IV lasix. BP 130s here today, but running 90-110s at home. She has chronic dizziness regardless of activity level. She previously had Dix-Halpike maneuvers performed by HHPT with immediate and sustained relief. Hasn't followed up since, and HHPT finished after 4 weeks. She was started back on low dose spiro by PCP and tolerating well. Denies CP or orthopnea. Taking all medications as directed.   Labs (8/18): LDL 80, HDL 46 Labs (10/18): K 4.2, creatinine 1.1 Labs (11/18): digoxin 0.3 Labs (12/18): K 3.5, creatinine 1.02, UPEP with faint Bence-Jones protein but 24 hour UPEP negative and SPEP negative.  Labs (1/19): LDL 79, HDL 38, BNP 493, hgb 13.6, K 5.8 => 4.8, creatinine 1.18, digoxin 0.3 Labs (2/19): K 3.3, creatinine 1.06 Labs (3/19): digoxin < 0.2 Labs (4/19): K 5, creatinine 0.97 Labs (6/19): K 3.5, creatinine 1.19 Labs (7/19): digoxin < 0.2, K 4.4, creatinine 1.14  PMH: 1. PAD: L=>R fem-fem  bypass in 3/16.   - ABIs (3/18): Mildly abnormal, 0.81 left and 0.85 right.  - ABIs (4/19): 0.68 left and 0.66 right 2. H/o appendectomy 3. COPD: Quit smoking in 2/12.  - High resolution CT chest (2/19): No interstitial lung disease, +emphysema.  4. Arthritis: Bilateral TKR 5. Diet-controlled diabetes 6.  Hyperlipidemia 7. Carotid stenosis:  - 10/18 Carotid dopplers: 60-79% RICA stenosis, 27-03% LICA stenosis, left subclavian steal.  - 4/19 Carotid dopplers: 50-09% RICA, 38-18% LICA, left subclavian stenosis.  8. Chronic systolic CHF: Echo (2/99) with EF 20-25%, mild LV dilation, normal RV size and systolic function. 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 .  - LHC (10/17): Nonobstructive CAD.  - Biotronik ICD.  - LHC/RHC (12/18): 50% proximal PLOM (no obstructive disease). Mean RA 1, PA 32/10 mean 19, mean PCWP 7, CI 2.0.  - Echo (12/18): EF 20-25%, mild basal LV hypertrophy, severe mid to apical LV hypertrophy, moderate diastolic dysfunction, mild MR, PASP 42 mmHg.  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  - Workup appears negative for AL amyloidosis.  Negative abdominal fat pad biopsy.  However, PYP scan was suggestive of transthyretin amyloidosis.  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  10. Peripheral neuropathy: ?due to amyloidosis.  11. Gout 12. H/o Bell's palsy  SH: Lives with husband in [place], 3 daughters.  Quit smoking in 2012.    Family History  Problem Relation Age of Onset  . Heart disease Mother   . Hypertension Mother   Daughter with cardiomyopathy. Nephews with CAD.   Review of systems complete and found to be negative unless listed in HPI.    Current Outpatient Medications  Medication Sig Dispense Refill  . albuterol (PROVENTIL HFA;VENTOLIN HFA) 108 (90 Base) MCG/ACT inhaler Inhale 2 puffs into the lungs every 6 (six) hours as needed for wheezing or shortness of breath. 1 Inhaler 6  . albuterol (PROVENTIL) (2.5 MG/3ML) 0.083% nebulizer solution Take 3 mLs by nebulization every 4 (four)  hours as needed for wheezing.    [name] allopurinol (ZYLOPRIM) 100 MG tablet Take 100 mg by mouth daily.    [name] aspirin EC 81 MG tablet Take 162 mg by mouth daily.    [name] atorvastatin (LIPITOR) 20 MG tablet TAKE 1 TABLET BY MOUTH EVERY DAY (Patient taking differently: Take 20 mg by mouth daily at 6 PM. ) 30 tablet 5  . carvedilol (COREG) 12.5 MG tablet Take 1 tablet (12.5 mg total) by mouth 2 (two) times daily with a meal. 180 tablet 1  . Cyanocobalamin (B-12) 1000 MCG/ML KIT Inject 1,000 mg as directed every 30 (thirty) days.    . digoxin (DIGOX) 0.125 MG tablet Take 0.5 tablets (0.0625 mg total) by mouth daily. 15 tablet 5  . DULoxetine (CYMBALTA) 60 MG capsule Take 1 capsule (60 mg total) by mouth daily. 30 capsule 0  . fluticasone furoate-vilanterol (BREO ELLIPTA) 200-25 MCG/INH AEPB Inhale 1 puff into the lungs daily.    [name] glimepiride (AMARYL) 2 MG tablet Take 2 mg by mouth daily.    [name] levothyroxine (SYNTHROID, LEVOTHROID) 25 MCG tablet Take 25 mcg by mouth daily before breakfast.    . losartan (COZAAR) 25 MG tablet Take 1 tablet (25 mg total) by mouth at bedtime. 30 tablet 3  . metFORMIN (GLUCOPHAGE-XR) 500 MG  24 hr tablet Take 500 mg by mouth 2 (two) times daily.    . metolazone (ZAROXOLYN) 2.5 MG tablet Take 1 tablet (2.5 mg total) by mouth as needed. 4 tablet 0  . pregabalin (LYRICA) 150 MG capsule Take 150 mg by mouth 2 (two) times daily.    [name] spironolactone (ALDACTONE) 25 MG tablet Take 12.5 mg by mouth daily.     . Tafamidis Meglumine, Cardiac, 20 MG CAPS Take 20 mg by mouth 4 (four) times daily. 120 capsule 3  . torsemide (DEMADEX) 20 MG tablet Take 40 mg by mouth 2 (two) times daily.     No current facility-administered medications for this encounter.    Vitals:   11/04/18 1030  BP: 132/86  Pulse: (!) 104  SpO2: 97%  Weight: 81.6 kg (180 lb)     Wt Readings from Last 3 Encounters:  11/04/18 81.6 kg (180 lb)  10/13/18 86.7 kg (191 lb 1.6 oz)  08/13/18 88.9 kg (196 lb)    General: Well appearing. No resp difficulty. HEENT: Normal Neck: Supple. JVP 5-6. Carotids 2+ bilat; no bruits. No thyromegaly or nodule noted. Cor: PMI nondisplaced. RRR, No M/G/R noted Lungs: CTAB, normal effort. Abdomen: Soft, non-tender, non-distended, no HSM. No bruits or masses. +BS  Extremities: No cyanosis, clubbing, or rash. R and LLE no edema.  Neuro: Alert & orientedx3, cranial nerves grossly intact. moves all 4 extremities w/o difficulty. Affect pleasant   Assessment/Plan: 1. Chronic systolic CHF: Nonischemic cardiomyopathy.  EF 20-25% on echo in 2/18.  LHC/RHC in 12/18 with low filling pressures, CI 2.0, and nonobstructive CAD. She has a Biotronik ICD.  Also of note, daughter and nephew have sarcoidosis => patient had a chest CT in 2/19 without evidence for pulmonary sarcoidosis.  We are unable to do an MRI to look for infiltrative disease due to ICD.  Echo in 12/18 showed EF 20-25%, noted to have LVH pattern that is mild in the basal segments and moderate to severe in the apical segments. This was concerning for apical HCM and study was repeated in 2/19 with Definity contrast.  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 We also did a workup for amyloidosis. She did not have evidence for AL amyloidosis, but TcPYP scan in 2/19 was strongly suggestive of transthyretin amyloidosis.  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 Genetic testing was negative, suggesting wild-type TTR amyloidosis. Echo 07/27/18: EF 30-35%, grade 1 DD.  - NYHA III symptoms chronically in setting of deconditioning. - Volume status looks OK on exam.  - Continue torsemide 80 mg daily. BMET/Mg today.  - Continue losartan 25 mg qhs.  - Continue digoxin, dig level 0.3 11/2 - Continue coreg 12.5 mg BID - Continue spiro 12.5 mg daily for now.  - Narrow  QRS, not CRT candidate.  - Discussed limiting fluid and salt intake - Continue tafamidis 2. PAD: s/p fem-fem bypass, followed by [facility].  No claudication, last ABIs somewhat worse in 4/19. She does not have clear claudication, pain in legs seems to be related to peripheral neuropathy and arthritis.  Continue to followup with [facility], sees Dr Oneida Alar soon. No change.  3. Carotid stenosis: Stable moderate stenosis on doppers. Follows with [facility]. No change.  4. Hyperlipidemia: Continue atorvastatin. Recent lipids were acceptable (LDL 79 09/2017). No change.  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 She has peripheral neuropathy.  Genetic  testing negative, wild-type.  - She is taking her Tafamidis.  6. Unsteady gait/BPPV - Had been followed by [facility] PT for vestibular and balance with improvement, and has gradually worsened stince stopping.  - Will refer to [facility] Neuro for long term management of her vertigo/BPPV by history.   Doing well post diuretic adjustment. Labs today. RTC 4 months, sooner with symptoms.   Shirley Friar, PA-C 11/04/2018   Greater than 50% of the 25 minute visit was spent in counseling/coordination of care regarding disease state education, salt/fluid restriction, sliding scale diuretics, and medication compliance.

## 2018-11-04 NOTE — Patient Instructions (Signed)
Labs today We will only contact you if something comes back abnormal or we need to make some changes. Otherwise no news is good news!  You have been referred to [facility] Neuro.  They will contact you to schedule your appointment.  Your physician recommends that you schedule a follow-up appointment in: 4 months with Dr. Aundra Dubin.

## 2018-11-04 NOTE — Telephone Encounter (Signed)
-----   Message from Shirley Friar, PA-C sent at 11/04/2018 11:50 AM EST ----- Hold torsemide for 3 days, then resume at 60 mg (3 tabs) daily.   Needs repeat next week.    [name] [address] [place], PA-C 11/04/2018 11:50 AM

## 2018-11-04 NOTE — Telephone Encounter (Signed)
Notes recorded by Kerry Dory, CMA on 11/04/2018 at 3:47 PM EST Pt aware and voiced understanding ------  Notes recorded by Shirley Friar, PA-C on 11/04/2018 at 11:50 AM EST Hold torsemide for 3 days, then resume at 60 mg (3 tabs) daily.  Needs repeat next week.    [name] [address]" [place], PA-C 11/04/2018 11:50 AM

## 2018-11-06 ENCOUNTER — Other Ambulatory Visit (HOSPITAL_COMMUNITY): Payer: Self-pay | Admitting: Internal Medicine

## 2018-11-08 ENCOUNTER — Telehealth (HOSPITAL_COMMUNITY): Payer: Self-pay

## 2018-11-08 NOTE — Telephone Encounter (Signed)
Lisa [name] [facility]) called to report that pt has gained 6 lbs in 3 days. No other symptoms to report. Pt was taken off her torsemide 20 mg for 3 days. She will restart it today. Pt is currently taking 3 tabs (60 mg) daily of torsemide. Please advise.

## 2018-11-08 NOTE — Telephone Encounter (Signed)
Pt had significant AKI and Torsemide was cut back. Repeat labs next week as planned. No further changes for now. Resume torsemide as planned.

## 2018-11-08 NOTE — Telephone Encounter (Signed)
Left VM for pt 

## 2018-11-12 ENCOUNTER — Ambulatory Visit (HOSPITAL_COMMUNITY)
Admission: RE | Admit: 2018-11-12 | Discharge: 2018-11-12 | Disposition: A | Discharge status: Home / Self Care | Payer: Medicare Other | Source: Ambulatory Visit | Attending: Cardiology | Admitting: Cardiology

## 2018-11-12 DIAGNOSIS — I5022 Chronic systolic (congestive) heart failure: Secondary | ICD-10-CM | POA: Insufficient documentation

## 2018-11-12 LAB — BASIC METABOLIC PANEL
Anion gap: 17 — ABNORMAL HIGH (ref 5–15)
BUN: 32 mg/dL — ABNORMAL HIGH (ref 8–23)
CO2: 25 mmol/L (ref 22–32)
Calcium: 9.4 mg/dL (ref 8.9–10.3)
Chloride: 97 mmol/L — ABNORMAL LOW (ref 98–111)
Creatinine, Ser: 1.19 mg/dL — ABNORMAL HIGH (ref 0.44–1.00)
GFR calc Af Amer: 53 mL/min — ABNORMAL LOW (ref >60)
GFR calc non Af Amer: 46 mL/min — ABNORMAL LOW (ref >60)
Glucose, Bld: 276 mg/dL — ABNORMAL HIGH (ref 70–99)
Potassium: 4.2 mmol/L (ref 3.5–5.1)
Sodium: 139 mmol/L (ref 135–145)

## 2018-11-13 ENCOUNTER — Ambulatory Visit (INDEPENDENT_AMBULATORY_CARE_PROVIDER_SITE_OTHER): Payer: Medicare Other

## 2018-11-13 DIAGNOSIS — I429 Cardiomyopathy, unspecified: Secondary | ICD-10-CM

## 2018-11-13 DIAGNOSIS — I5022 Chronic systolic (congestive) heart failure: Secondary | ICD-10-CM

## 2018-11-15 LAB — CUP PACEART REMOTE DEVICE CHECK
Date Time Interrogation Session: 20200221072500
Implantable Lead Implant Date: 20140812
Implantable Lead Implant Date: 20140812
Implantable Lead Location: 753860
Implantable Lead Model: 350
Implantable Lead Model: 375
Implantable Lead Serial Number: 10491693
Implantable Lead Serial Number: 29451060
Implantable Pulse Generator Implant Date: 20140812
MDC IDC LEAD LOCATION: 753859
Pulse Gen Model: 383562
Pulse Gen Serial Number: 60721404

## 2018-11-21 ENCOUNTER — Telehealth (HOSPITAL_COMMUNITY): Payer: Self-pay

## 2018-11-21 ENCOUNTER — Other Ambulatory Visit (HOSPITAL_COMMUNITY): Payer: Self-pay | Admitting: Internal Medicine

## 2018-11-21 NOTE — Telephone Encounter (Signed)
Pt called wanting to know if she can get an Rx for K? Pt states that she is having cramps all over. Pt's last bmet showed her K was 4.2. Please advise.

## 2018-11-21 NOTE — Telephone Encounter (Signed)
Can increase spiro to 25 mg daily.  This helps her hold on to potassium. No indication for oral potassium supp at this time.    [name] [address] [place], PA-C 11/21/2018 12:20 PM

## 2018-11-21 NOTE — Telephone Encounter (Signed)
Pt notified. Pt states she is going to just leave it alone and speak with Aundra Dubin in June.

## 2018-11-21 NOTE — Progress Notes (Signed)
Remote ICD transmission.   

## 2018-11-22 ENCOUNTER — Encounter: Payer: Self-pay | Admitting: Cardiology

## 2018-11-22 ENCOUNTER — Telehealth (HOSPITAL_COMMUNITY): Payer: Self-pay

## 2018-11-22 NOTE — Telephone Encounter (Signed)
BP is not significantly low so would continue cardiac meds.  With CVA, would worry about atrial fibrillation.  Unless her Biotronik device can be interrogated for the presence of atrial fibrillation, she will need a LINQ monitor.  Would refer her to EP for this (EP can tell us if atrial fibrillation can be detected by her device).

## 2018-11-22 NOTE — Telephone Encounter (Signed)
Pt called today. Wanted to make you aware that her primary care md had her have a CT scan and it showed that she had a cva of undetermined age. She also has been having dizziness for about a month and said her PCP suggest cutting her bp meds. Her home bps have ranged low 100's/ 50's-60's.  Pt does have a neurology appointment 3/30. Please advise

## 2018-11-22 NOTE — Telephone Encounter (Signed)
Called pt regarding advice on bp meds. Called both home and cell phone. Cell phone voicemail box full.

## 2018-11-26 ENCOUNTER — Encounter: Payer: Medicare Other | Admitting: Cardiology

## 2018-11-26 ENCOUNTER — Other Ambulatory Visit (HOSPITAL_COMMUNITY): Payer: Self-pay | Admitting: Cardiology

## 2018-11-30 ENCOUNTER — Other Ambulatory Visit (HOSPITAL_COMMUNITY): Payer: Self-pay | Admitting: Student

## 2018-12-03 NOTE — Telephone Encounter (Addendum)
Spoke to pt. Pt states she has an appointment with her EP MD later this month. Message to EP MD as Juluis Rainier

## 2018-12-09 NOTE — Telephone Encounter (Signed)
Patient has RA lead and has seen no evidence of atrial fibrillation.

## 2018-12-17 ENCOUNTER — Other Ambulatory Visit (HOSPITAL_COMMUNITY): Payer: Self-pay | Admitting: Student

## 2018-12-19 ENCOUNTER — Telehealth: Payer: Self-pay

## 2018-12-19 NOTE — Telephone Encounter (Signed)
I attempted to reach the pt on both numbers listed on file. Numbers would not connect. Pt needs appt either changed to a webex video or rescheduled to a later date. Will try again at a later time.

## 2018-12-20 NOTE — Telephone Encounter (Signed)
I contacted the pt. Pt did not consent to virtual visit but is agreeable to scheduling to next available face to face visit. Pt has been scheduled to 02/13/19 at 9:30 am and has been added to the wait list for an earlier appt.

## 2018-12-20 NOTE — Telephone Encounter (Signed)
Pt returned Rn's call. Pt said she is not having vertigo symptoms anymore so I said the appt could be cancelled and that was great. She said her PCP did not want her to go back to neurologist in [facility] and she needs to be seen at the clinic. Can you please call her to discuss this. She can be reached at the home number. THank you

## 2018-12-20 NOTE — Telephone Encounter (Signed)
I contacted the pt and left a vm requested a phone call back. I advised, Due to current COVID 19 pandemic, our office is severely reducing in office visits for at least the next 2 weeks, in order to minimize the risk to our patients and healthcare providers.   Pt was advised we would need to either reschedule or complete a video visit.

## 2018-12-23 ENCOUNTER — Ambulatory Visit: Payer: Medicare Other | Admitting: Neurology

## 2018-12-25 ENCOUNTER — Other Ambulatory Visit (HOSPITAL_COMMUNITY): Payer: Self-pay | Admitting: Cardiology

## 2018-12-30 ENCOUNTER — Other Ambulatory Visit: Payer: Self-pay

## 2018-12-30 DIAGNOSIS — I6523 Occlusion and stenosis of bilateral carotid arteries: Secondary | ICD-10-CM

## 2018-12-30 DIAGNOSIS — I779 Disorder of arteries and arterioles, unspecified: Secondary | ICD-10-CM

## 2018-12-30 DIAGNOSIS — I739 Peripheral vascular disease, unspecified: Secondary | ICD-10-CM

## 2018-12-30 NOTE — Progress Notes (Signed)
a 

## 2019-01-07 ENCOUNTER — Telehealth: Payer: Self-pay | Admitting: *Deleted

## 2019-01-07 NOTE — Telephone Encounter (Signed)
Called patient to let them know due to recent [place] and Health Department Protocols, we are not seeing patients in the office. We are instead seeing if they would like to schedule this appointment as a Nurse, children's or Laptop. Patient is aware if they decide to reschedule this appointment, they may not be seen or scheduled for the next 4-6 months. Patient at this time declines Virtual Visits. She feels she is fine and [place] [facility] checks on her regularly. Would like to reschedule this appointment at a later date. Message sent scheduling and nurse.   Concerns and/or Complaints:                    Since your last visit or hospitalization:   1. Have you been having new or worsening chest pain? Nothing Recent 2. Have you been having new or worsening shortness of breath? ONLY when taking the trash out 3. Have you been having new or worsening leg swelling, wt gain, or increase in abdominal girth (pants fitting more tightly)? swelling in feet at the end of the day that goes away when resting. 4. Have you had any passing out spells? No 5. Have you had any extreme tiredness or fatigue? Only tiredness after a busy day

## 2019-01-09 ENCOUNTER — Encounter (HOSPITAL_COMMUNITY): Payer: Medicare Other

## 2019-01-09 ENCOUNTER — Ambulatory Visit: Payer: Medicare Other | Admitting: Family

## 2019-01-09 ENCOUNTER — Ambulatory Visit (HOSPITAL_COMMUNITY): Payer: Medicare Other

## 2019-01-10 NOTE — Telephone Encounter (Signed)
Pt stable, receiving home monitor checks. Pt rescheduled to a yearly OV in [place] with Dr. Curt Bears. Pt recently seen by heart failure clinic and will follow up there again in June. Pt appreciates my return call.

## 2019-01-12 ENCOUNTER — Other Ambulatory Visit (HOSPITAL_COMMUNITY): Payer: Self-pay | Admitting: Student

## 2019-01-13 ENCOUNTER — Other Ambulatory Visit (HOSPITAL_COMMUNITY): Payer: Self-pay | Admitting: Cardiology

## 2019-01-14 ENCOUNTER — Encounter: Payer: Medicare Other | Admitting: Cardiology

## 2019-01-15 MED ORDER — METOLAZONE 2.5 MG PO TABS: 2.5000 mg | ORAL_TABLET | ORAL | 0 refills | Status: DC | PRN

## 2019-01-16 ENCOUNTER — Other Ambulatory Visit (HOSPITAL_COMMUNITY): Payer: Self-pay | Admitting: Cardiology

## 2019-01-21 ENCOUNTER — Telehealth: Payer: Self-pay | Admitting: Cardiology

## 2019-01-21 NOTE — Telephone Encounter (Signed)
I spoke to the patient who called because she is experiencing mild chest pressure, no pain.  BP 119/57.  She says that it started awhile back, and there are prolonged intervals (days) between occurrences.    There are no other symptoms related to this, SOB or radiating elsewhere.  She had an appt with Dr Curt Bears 4/21, but it was canceled due to CoronaVirus.  She would like to set something up with him in the [place] office or virtual in the near future.  I instructed her to go to the ED if symptoms worsen.  She verbalized understanding.

## 2019-01-21 NOTE — Telephone Encounter (Signed)
Sent to Dr Aundra Dubin for further recommendations

## 2019-01-21 NOTE — Telephone Encounter (Signed)
New message    Pt c/o of Chest Pain: STAT if CP now or developed within 24 hours  1. Are you having CP right now? She says its not really a pain its more like a pressure   2. Are you experiencing any other symptoms (ex. SOB, nausea, vomiting, sweating)? No  3. How long have you been experiencing CP? She said about 2 months off and on   4. Is your CP continuous or coming and going? Coming and going, she said two or three days before she will feel it again   5. Have you taken Nitroglycerin? No  ?

## 2019-01-21 NOTE — Telephone Encounter (Signed)
Forwarding primary cardiologist team to address pt's reported chest pressure. (Dr. Curt Bears made aware and agreeable to forwarding this, as he follows their ICD)

## 2019-01-21 NOTE — Telephone Encounter (Signed)
Would arrange telehealth appt

## 2019-01-22 NOTE — Telephone Encounter (Signed)
Spoke with pt, she is aware of Telehealth [facility] visit on 01/27/2019 @2 :20 with Dr. Aundra Dubin.  Pt advised if she has problems before that to call our office and speak with Nurse.

## 2019-01-27 ENCOUNTER — Other Ambulatory Visit: Payer: Self-pay

## 2019-01-27 ENCOUNTER — Ambulatory Visit (HOSPITAL_COMMUNITY)
Admission: RE | Admit: 2019-01-27 | Discharge: 2019-01-27 | Disposition: A | Discharge status: Home / Self Care | Payer: Medicare Other | Source: Ambulatory Visit | Attending: Cardiology | Admitting: Cardiology

## 2019-01-27 DIAGNOSIS — I43 Cardiomyopathy in diseases classified elsewhere: Secondary | ICD-10-CM

## 2019-01-27 DIAGNOSIS — I5042 Chronic combined systolic (congestive) and diastolic (congestive) heart failure: Secondary | ICD-10-CM

## 2019-01-27 DIAGNOSIS — E854 Organ-limited amyloidosis: Secondary | ICD-10-CM

## 2019-01-27 MED ORDER — SPIRONOLACTONE 25 MG PO TABS: 25.0000 mg | ORAL_TABLET | Freq: Every day | ORAL | 3 refills | Status: DC

## 2019-01-27 MED ORDER — TORSEMIDE 20 MG PO TABS: 60.0000 mg | ORAL_TABLET | Freq: Every day | ORAL | 1 refills | Status: DC

## 2019-01-27 MED ORDER — PANTOPRAZOLE SODIUM 40 MG PO TBEC: 40.0000 mg | DELAYED_RELEASE_TABLET | Freq: Every day | ORAL | 11 refills | Status: DC

## 2019-01-27 NOTE — Progress Notes (Signed)
Unable to reach pt. Left voicemail. Sent script for lab work to [facility].

## 2019-01-27 NOTE — Progress Notes (Signed)
Heart Failure TeleHealth Note  Due to national recommendations of social distancing due to [place] 19, Audio/video telehealth visit is felt to be most appropriate for this patient at this time.  See MyChart message from today for patient consent regarding telehealth for [facility]  Date:  01/27/2019   ID:  Lisa [name], DOB May 25, 1946, MRN [id number]  Location: Home  Provider location:  Advanced Heart Failure Type of Visit: Established patient  PCP:  [name], MD  Cardiologist:  Loralie Champagne, MD  Chief Complaint: Chest pain   History of Present Illness: Lisa [name] is a 73 y.o. female who presents via audio/video conferencing for a telehealth visit today.     she denies symptoms worrisome for COVID 19.   Patient has a history of PAD, COPD, carotid stenosis, and chronic systolic CHF.    Patient has a long history of vascular disease.  She had left-right fem-fem bypass in 3/16. She has moderate bilateral carotid stenosis.  Echo in 2/18 showed EF 20-25%.  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  She was admitted in 9/18 to the hospital in [facility] for about 4 days with acute on chronic systolic CHF.    RHC/LHC in 12/18 showed nonobstructive coronary disease, low filling pressures, and CI 2.0.   Admitted 09/20/17 with volume overload. Diuresed with IV lasix. Weight went down 2 pounds. Discharged on torsemide 40 mg daily. Discharge weight 179 pounds.   Echo in 12/18 with EF 20-25%, mild basal ventricular hypertrophy, severe mid to apical LV hypertrophy.  Repeat echo in 2/19 was done with Definity contrast, showing that what was thought to be apical hypertrophy was actually prominent mid-apical trabeculation suggestive of noncompaction.   She was noted to have Bence-Jones protein on UPEP.  However, SPEP was negative and 24 hours UPEP was also negative. Abdominal fat pad biopsy did not show evidence of amyloidosis.   She saw heme-onc, probably does not have AL amyloidosis.  However, in 2/19, she had TcPYP scan that was strongly suggestive of transthyretin amyloidosis. She has peripheral neuropathy that may be due to amyloidosis as well. 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   Admitted 6/24-6/26/19 for LLL PNA. She received IV abx and prednisone. BP medications were held, but restarted at discharge.   Echo in 11/19 showed EF 30-35%, mild LVH, mild-moderate MR.   Weight has been stable recently.  She has no dyspnea walking on flat ground.  She has been noticing central chest pressure on and off for about 2 months.  There is no trigger.  Generally, it wakes her up at night.  She does not have exertional chest pain/pressure.  It rarely occurs during the day. No orthopnea/PND.  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  She has bilateral leg pain from foot to thigh, worse when she is moving around.  No ulcers/sores on her feet.  Labs (8/18): LDL 80, HDL 46 Labs (10/18): K 4.2, creatinine 1.1 Labs (11/18): digoxin 0.3 Labs (12/18): K 3.5, creatinine 1.02, UPEP with faint Bence-Jones protein but 24 hour UPEP negative and SPEP negative.  Labs (1/19): LDL 79, HDL 38, BNP 493, hgb 13.6, K 5.8 => 4.8, creatinine 1.18, digoxin 0.3 Labs (2/19): K 3.3, creatinine 1.06 Labs (3/19): digoxin < 0.2 Labs (4/19): K 5, creatinine 0.97 Labs (6/19): K 3.5, creatinine 1.19 Labs (7/19): digoxin < 0.2, K 4.4, creatinine 1.14 Labs (2/20): K 4.2, creatinine 1.19  PMH: 1. PAD: L=>R fem-fem bypass in  3/16.   - ABIs (3/18): Mildly abnormal, 0.81 left and 0.85 right.  - ABIs (4/19): 0.68 left and 0.66 right 2. H/o appendectomy 3. COPD: Quit smoking in 2/12.  - High resolution CT chest (2/19): No interstitial lung disease, +emphysema.  4. Arthritis: Bilateral TKR 5. Diet-controlled diabetes 6. Hyperlipidemia 7. Carotid stenosis:  - 10/18 Carotid dopplers: 60-79% RICA stenosis, 10-07% LICA stenosis,  left subclavian steal.  - 4/19 Carotid dopplers: 12-19% RICA, 75-88% LICA, left subclavian stenosis.  8. Chronic systolic CHF: Echo (3/25) with EF 20-25%, mild LV dilation, normal RV size and systolic function. 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 .  - LHC (10/17): Nonobstructive CAD.  - Biotronik ICD.  - LHC/RHC (12/18): 50% proximal PLOM (no obstructive disease). Mean RA 1, PA 32/10 mean 19, mean PCWP 7, CI 2.0.  - Echo (12/18): EF 20-25%, mild basal LV hypertrophy, severe mid to apical LV hypertrophy, moderate diastolic dysfunction, mild MR, PASP 42 mmHg.  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  - Workup appears negative for AL amyloidosis.  Negative abdominal fat pad biopsy.  However, PYP scan was suggestive of transthyretin amyloidosis.  - Echo (11/19): EF 30-35%, mild LVH, mild-moderate MR, RV normal.  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  10. Peripheral neuropathy: ?due to amyloidosis.  11. Gout 12. H/o Bell's palsy  Current Outpatient Medications  Medication Sig Dispense Refill  . albuterol (PROVENTIL HFA;VENTOLIN HFA) 108 (90 Base) MCG/ACT inhaler Inhale 2 puffs into the lungs every 6 (six) hours as needed for wheezing or shortness of breath. 1 Inhaler 6  . albuterol (PROVENTIL) (2.5 MG/3ML) 0.083% nebulizer solution Take 3 mLs by nebulization every 4 (four) hours as needed for wheezing.    [name] allopurinol (ZYLOPRIM) 100 MG tablet Take 100 mg by mouth daily.    [name] aspirin EC 81 MG tablet Take 162 mg by mouth daily.    [name] atorvastatin (LIPITOR) 20 MG tablet TAKE 1 TABLET BY MOUTH EVERY DAY (Patient taking differently: Take 20 mg by mouth daily at 6 PM. ) 30 tablet 5  . carvedilol (COREG) 12.5 MG tablet  TAKE 1 TABLET (12.5 MG TOTAL) BY MOUTH 2 (TWO) TIMES DAILY WITH A MEAL. 180 tablet 1  . Cyanocobalamin (B-12) 1000 MCG/ML KIT Inject 1,000 mg as directed every 30 (thirty) days.    . digoxin (LANOXIN) 0.125 MG tablet TAKE 1/2 TABLET BY MOUTH DAILY 45 tablet 0  . DULoxetine (CYMBALTA) 60 MG capsule Take 1 capsule (60 mg total) by mouth daily. 30 capsule 0  . fluticasone furoate-vilanterol (BREO ELLIPTA) 200-25 MCG/INH AEPB Inhale 1 puff into the lungs daily.    [name] glimepiride (AMARYL) 2 MG tablet Take 2 mg by mouth daily.    [name] levothyroxine (SYNTHROID, LEVOTHROID) 25 MCG tablet Take 25 mcg by mouth daily before breakfast.    . losartan (COZAAR) 25 MG tablet TAKE 1 TABLET BY MOUTH EVERYDAY AT BEDTIME 90 tablet 1  . metFORMIN (GLUCOPHAGE-XR) 500 MG 24 hr tablet Take 500 mg by mouth 2 (two) times daily.    . metolazone (ZAROXOLYN) 2.5 MG tablet Take 1 tablet (2.5 mg total) by mouth as needed. 4 tablet 0  . pantoprazole (PROTONIX) 40 MG tablet Take 1 tablet (40 mg total) by mouth at bedtime. 30 tablet 11  .  pregabalin (LYRICA) 150 MG capsule Take 150 mg by mouth 2 (two) times daily.    [name] spironolactone (ALDACTONE) 25 MG tablet Take 1 tablet (25 mg total) by mouth daily. 90 tablet 3  . Tafamidis Meglumine, Cardiac, 20 MG CAPS Take 20 mg by mouth 4 (four) times daily. 120 capsule 3  . torsemide (DEMADEX) 20 MG tablet Take 3 tablets (60 mg total) by mouth daily. 270 tablet 1   No current facility-administered medications for this encounter.     Allergies:   Oxycodone and Entresto [sacubitril-valsartan]   Social History:  The patient  reports that she quit smoking about 8 years ago. Her smoking use included cigarettes. She has never used smokeless tobacco. She reports that she does not drink alcohol or use drugs.   Family History:  The patient's family history includes Heart disease in her mother; Hypertension in her mother.   ROS:  Please see the history of present illness.   All other systems are  personally reviewed and negative.   Exam:  (Video/Tele Health Call; Exam is subjective and or/visual.) General:  Speaks in full sentences. No resp difficulty. Neck: No JVD Lungs: Normal respiratory effort with conversation.  Abdomen: Non-distended per patient report Extremities: Pt denies edema. Neuro: Alert & oriented x 3.   Recent Labs: 07/27/2018: ALT 33; TSH 10.099 10/08/2018: B Natriuretic Peptide 392.0; Hemoglobin 12.5; Platelets 302 11/04/2018: Magnesium 2.1 11/12/2018: BUN 32; Creatinine, Ser 1.19; Potassium 4.2; Sodium 139  Personally reviewed   Wt Readings from Last 3 Encounters:  11/04/18 81.6 kg (180 lb)  10/13/18 86.7 kg (191 lb 1.6 oz)  08/13/18 88.9 kg (196 lb)      ASSESSMENT AND PLAN:  1. Chronic systolic CHF: Nonischemic cardiomyopathy.  EF 20-25% on echo in 2/18.  LHC/RHC in 12/18 with low filling pressures, CI 2.0, and nonobstructive CAD. She has a Biotronik ICD.  Also of note, daughter and nephew have sarcoidosis => patient had a chest CT in 2/19 without evidence for pulmonary sarcoidosis.  We are unable to do an MRI to look for infiltrative disease due to ICD.  Echo in 12/18 showed EF 20-25%, noted to have LVH pattern that is mild in the basal segments and moderate to severe in the apical segments. This was concerning for apical HCM and study was repeated in 2/19 with Definity contrast.  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 We also did a workup for amyloidosis. She did not have evidence for AL amyloidosis, but TcPYP scan in 2/19 was strongly suggestive of transthyretin amyloidosis.  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 Genetic testing was negative, suggesting wild-type TTR amyloidosis. Most recent echo in 11/19 showed EF 30-35%. NYHA class II symptoms with stable weight.  - Continue torsemide 60 mg  daily.  - She did not tolerate low dose Entresto. Continue losartan 25 mg daily.   - Increase spironolactone to 25 mg daily. BMET 1 week.  - Continue digoxin, check digoxin level with next labs.  - Continue Coreg 12.5 mg bid.   - Narrow QRS, not CRT candidate.   2. PAD: s/p fem-fem bypass, followed by [facility].  Last ABIs somewhat worse in 4/19. Pain in legs seems more consistent with neuropathy than claudication.  - She will need followup with Dr. Oneida Alar for repeat peripheral arterial dopplers.  3. Carotid stenosis: Moderate stenosis on dopplers, due for repeat study. Follows with [facility].  No change 4. Hyperlipidemia: Continue atorvastatin, check LDL with next labs.  Goal LDL < 70 given vascular disease.  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 She has peripheral neuropathy.  Genetic testing negative, wild-type.  - Continue tafamidis.  6. Chest pain: Atypical, wakes her up from sleep.  Last cath was in 12/18, no significant CAD. ?GERD. - I will have her try taking Protonix at night before she goes to bed to see if this alleviates symptoms.  - If not, she will need Lexiscan Cardiolite.   COVID screen The patient does not have any symptoms that suggest any further testing/ screening at this time.  Social distancing reinforced today.  Patient Risk: After full review of this patients clinical status, I feel that they are at moderate risk for cardiac decompensation at this time.  Relevant cardiac medications were reviewed at length with the patient today. The patient does not have concerns regarding their medications at this time.   Recommended follow-up:  2-3 weeks telehealth  Today, I have spent 23 minutes with the patient with telehealth technology discussing the above issues .    Signed, Loralie Champagne, MD  01/27/2019 11:47 PM  [facility] [address] [facility] [place] [address] [phone] (office)  [phone] (fax)

## 2019-01-27 NOTE — Patient Instructions (Signed)
1. Increase spironolactone to 25 mg daily.  2. Change torsemide dose to 60 mg daily in computer (this is what she is taking).  3. Start Protonix 40 mg qhs. If this does not help chest pain, can stop after 2 wks.  [place] for chest pain. Can cancel if Protonix leads to resolution of pain.  5. BMET, lipids, digoxin level => draw in 1 week. She wants to do this at PCP's office, Dr. Laurann Montana in [facility]. Have it faxed to [place].

## 2019-02-05 ENCOUNTER — Other Ambulatory Visit (HOSPITAL_COMMUNITY): Payer: Self-pay

## 2019-02-05 ENCOUNTER — Other Ambulatory Visit (HOSPITAL_COMMUNITY): Payer: Self-pay | Admitting: Internal Medicine

## 2019-02-05 MED ORDER — TORSEMIDE 20 MG PO TABS: 60.0000 mg | ORAL_TABLET | Freq: Every day | ORAL | 1 refills | Status: DC

## 2019-02-06 NOTE — Telephone Encounter (Signed)
Patients last office visit note does not state to continue taking this medication. Please advise.

## 2019-02-07 ENCOUNTER — Other Ambulatory Visit (HOSPITAL_COMMUNITY): Payer: Self-pay | Admitting: Cardiology

## 2019-02-07 ENCOUNTER — Other Ambulatory Visit (HOSPITAL_COMMUNITY): Payer: Self-pay | Admitting: *Deleted

## 2019-02-07 MED ORDER — METOLAZONE 2.5 MG PO TABS: 2.5000 mg | ORAL_TABLET | ORAL | 0 refills | Status: DC | PRN

## 2019-02-11 NOTE — Telephone Encounter (Signed)
ERROR. DW

## 2019-02-12 ENCOUNTER — Ambulatory Visit (INDEPENDENT_AMBULATORY_CARE_PROVIDER_SITE_OTHER): Payer: Medicare Other | Admitting: *Deleted

## 2019-02-12 DIAGNOSIS — I429 Cardiomyopathy, unspecified: Secondary | ICD-10-CM | POA: Diagnosis not present

## 2019-02-13 ENCOUNTER — Other Ambulatory Visit: Payer: Self-pay

## 2019-02-13 ENCOUNTER — Telehealth (HOSPITAL_COMMUNITY): Payer: Self-pay

## 2019-02-13 ENCOUNTER — Ambulatory Visit: Payer: Self-pay | Admitting: Neurology

## 2019-02-13 LAB — CUP PACEART REMOTE DEVICE CHECK
Battery Remaining Percentage: 47 %
Battery Voltage: 3.09 V
Brady Statistic RA Percent Paced: 38 %
Brady Statistic RV Percent Paced: 4 %
Date Time Interrogation Session: 20200521093848
HighPow Impedance: 86 Ohm
Implantable Lead Implant Date: 20140812
Implantable Lead Implant Date: 20140812
Implantable Lead Location: 753859
Implantable Lead Location: 753860
Implantable Lead Model: 350
Implantable Lead Model: 375
Implantable Lead Serial Number: 10491693
Implantable Lead Serial Number: 29451060
Implantable Pulse Generator Implant Date: 20140812
Lead Channel Impedance Value: 1634 Ohm
Lead Channel Impedance Value: 535 Ohm
Lead Channel Pacing Threshold Amplitude: 0.4 V
Lead Channel Pacing Threshold Amplitude: 0.4 V
Lead Channel Pacing Threshold Pulse Width: 0.4 ms
Lead Channel Pacing Threshold Pulse Width: 0.4 ms
Lead Channel Sensing Intrinsic Amplitude: 13.2 mV
Lead Channel Setting Pacing Amplitude: 2.5 V
Lead Channel Setting Pacing Amplitude: 2.5 V
Lead Channel Setting Pacing Pulse Width: 0.4 ms
Lead Channel Setting Sensing Sensitivity: 0.8 mV
Pulse Gen Model: 383562
Pulse Gen Serial Number: 60721404

## 2019-02-13 NOTE — Telephone Encounter (Signed)
Spoke with the patient, instructions given. Pt stated that she understood and would be here. Asked to call back with any questions. S.Johntavius Shepard EMTP

## 2019-02-17 ENCOUNTER — Other Ambulatory Visit (HOSPITAL_COMMUNITY): Payer: Self-pay | Admitting: Internal Medicine

## 2019-02-18 ENCOUNTER — Other Ambulatory Visit: Payer: Self-pay

## 2019-02-18 ENCOUNTER — Ambulatory Visit (HOSPITAL_COMMUNITY): Payer: Medicare Other | Attending: Cardiology

## 2019-02-18 DIAGNOSIS — I5042 Chronic combined systolic (congestive) and diastolic (congestive) heart failure: Secondary | ICD-10-CM | POA: Insufficient documentation

## 2019-02-18 LAB — MYOCARDIAL PERFUSION IMAGING
LV dias vol: 209 mL (ref 46–106)
LV sys vol: 15 mL
Peak HR: 80 {beats}/min
Rest HR: 60 {beats}/min
SDS: 0
SRS: 0
SSS: 0
TID: 0.97

## 2019-02-18 MED ORDER — TECHNETIUM TC 99M TETROFOSMIN IV KIT
9.7000 | PACK | Freq: Once | INTRAVENOUS | Status: AC | PRN
  Administered 2019-02-18: 9.7 via INTRAVENOUS
  Filled 2019-02-18: qty 10

## 2019-02-18 MED ORDER — TECHNETIUM TC 99M TETROFOSMIN IV KIT
32.3000 | PACK | Freq: Once | INTRAVENOUS | Status: AC | PRN
  Administered 2019-02-18: 32.3 via INTRAVENOUS
  Filled 2019-02-18: qty 33

## 2019-02-18 MED ORDER — REGADENOSON 0.4 MG/5ML IV SOLN
0.4000 mg | Freq: Once | INTRAVENOUS | Status: AC
  Administered 2019-02-18: 13:00:00 0.4 mg via INTRAVENOUS

## 2019-02-19 ENCOUNTER — Telehealth (HOSPITAL_COMMUNITY): Payer: Self-pay

## 2019-02-19 NOTE — Telephone Encounter (Signed)
-----   Message from Larey Dresser, MD sent at 02/19/2019 10:41 AM EDT ----- EF 30%, no ischemia or infarction.

## 2019-02-19 NOTE — Telephone Encounter (Signed)
Pt aware of results of imaging study.  Pt appreciative of call.

## 2019-02-21 ENCOUNTER — Encounter: Payer: Self-pay | Admitting: Cardiology

## 2019-02-21 NOTE — Progress Notes (Signed)
Remote ICD transmission.   

## 2019-03-05 ENCOUNTER — Telehealth (HOSPITAL_COMMUNITY): Payer: Self-pay

## 2019-03-05 NOTE — Telephone Encounter (Signed)
COVID-19 pre-appointment screening questions:   Do you have a history of COVID-19 or a positive test result in the past 7-10 days? No  To the best of your knowledge, have you been in close contact with anyone with a confirmed diagnosis of COVID 19? No  Have you had any one or more of the following: Fever, chills, cough, shortness of breath (out of the normal for you) or any flu-like symptoms? No  Are you experiencing any of the following symptoms that is new or out of usual for you: No  . Ear, nose or throat discomfort . Sore throat . Headache . Muscle Pain . Diarrhea . Loss of taste or smell   Reviewed all the following with patient: . Use of hand sanitizer when entering the building . Everyone is required to wear a mask in the building, if you do not have a mask we are happy to provide you with one when you arrive . NO Visitor guidelines   If patient answers YES to any of questions they must change to a virtual visit and place note in comments about symptoms  

## 2019-03-06 ENCOUNTER — Encounter (HOSPITAL_COMMUNITY): Payer: Self-pay | Admitting: Cardiology

## 2019-03-06 ENCOUNTER — Telehealth (HOSPITAL_COMMUNITY): Payer: Self-pay

## 2019-03-06 ENCOUNTER — Other Ambulatory Visit: Payer: Self-pay

## 2019-03-06 ENCOUNTER — Ambulatory Visit (HOSPITAL_COMMUNITY)
Admission: RE | Admit: 2019-03-06 | Discharge: 2019-03-06 | Disposition: A | Discharge status: Home / Self Care | Payer: Medicare Other | Source: Ambulatory Visit | Attending: Cardiology | Admitting: Cardiology

## 2019-03-06 VITALS — BP 102/68 | HR 93 | Wt 183.6 lb

## 2019-03-06 DIAGNOSIS — I43 Cardiomyopathy in diseases classified elsewhere: Secondary | ICD-10-CM

## 2019-03-06 DIAGNOSIS — E1142 Type 2 diabetes mellitus with diabetic polyneuropathy: Secondary | ICD-10-CM | POA: Insufficient documentation

## 2019-03-06 DIAGNOSIS — I6523 Occlusion and stenosis of bilateral carotid arteries: Secondary | ICD-10-CM | POA: Insufficient documentation

## 2019-03-06 DIAGNOSIS — I5022 Chronic systolic (congestive) heart failure: Secondary | ICD-10-CM | POA: Insufficient documentation

## 2019-03-06 DIAGNOSIS — Z7984 Long term (current) use of oral hypoglycemic drugs: Secondary | ICD-10-CM | POA: Diagnosis not present

## 2019-03-06 DIAGNOSIS — Z8249 Family history of ischemic heart disease and other diseases of the circulatory system: Secondary | ICD-10-CM | POA: Insufficient documentation

## 2019-03-06 DIAGNOSIS — J449 Chronic obstructive pulmonary disease, unspecified: Secondary | ICD-10-CM | POA: Diagnosis not present

## 2019-03-06 DIAGNOSIS — Z95810 Presence of automatic (implantable) cardiac defibrillator: Secondary | ICD-10-CM | POA: Insufficient documentation

## 2019-03-06 DIAGNOSIS — I2510 Atherosclerotic heart disease of native coronary artery without angina pectoris: Secondary | ICD-10-CM | POA: Diagnosis not present

## 2019-03-06 DIAGNOSIS — Z7982 Long term (current) use of aspirin: Secondary | ICD-10-CM | POA: Diagnosis not present

## 2019-03-06 DIAGNOSIS — M109 Gout, unspecified: Secondary | ICD-10-CM | POA: Insufficient documentation

## 2019-03-06 DIAGNOSIS — Z7951 Long term (current) use of inhaled steroids: Secondary | ICD-10-CM | POA: Diagnosis not present

## 2019-03-06 DIAGNOSIS — Z87891 Personal history of nicotine dependence: Secondary | ICD-10-CM | POA: Insufficient documentation

## 2019-03-06 DIAGNOSIS — Z79890 Hormone replacement therapy (postmenopausal): Secondary | ICD-10-CM | POA: Diagnosis not present

## 2019-03-06 DIAGNOSIS — K219 Gastro-esophageal reflux disease without esophagitis: Secondary | ICD-10-CM | POA: Insufficient documentation

## 2019-03-06 DIAGNOSIS — E854 Organ-limited amyloidosis: Secondary | ICD-10-CM | POA: Diagnosis not present

## 2019-03-06 DIAGNOSIS — Z885 Allergy status to narcotic agent status: Secondary | ICD-10-CM | POA: Diagnosis not present

## 2019-03-06 DIAGNOSIS — E785 Hyperlipidemia, unspecified: Secondary | ICD-10-CM | POA: Diagnosis not present

## 2019-03-06 DIAGNOSIS — Z79899 Other long term (current) drug therapy: Secondary | ICD-10-CM | POA: Insufficient documentation

## 2019-03-06 DIAGNOSIS — Z96653 Presence of artificial knee joint, bilateral: Secondary | ICD-10-CM | POA: Diagnosis not present

## 2019-03-06 DIAGNOSIS — I428 Other cardiomyopathies: Secondary | ICD-10-CM | POA: Insufficient documentation

## 2019-03-06 LAB — BASIC METABOLIC PANEL
Anion gap: 13 (ref 5–15)
BUN: 46 mg/dL — ABNORMAL HIGH (ref 8–23)
CO2: 25 mmol/L (ref 22–32)
Calcium: 9.3 mg/dL (ref 8.9–10.3)
Chloride: 97 mmol/L — ABNORMAL LOW (ref 98–111)
Creatinine, Ser: 1.77 mg/dL — ABNORMAL HIGH (ref 0.44–1.00)
GFR calc Af Amer: 33 mL/min — ABNORMAL LOW (ref >60)
GFR calc non Af Amer: 28 mL/min — ABNORMAL LOW (ref >60)
Glucose, Bld: 431 mg/dL — ABNORMAL HIGH (ref 70–99)
Potassium: 4.9 mmol/L (ref 3.5–5.1)
Sodium: 135 mmol/L (ref 135–145)

## 2019-03-06 LAB — MAGNESIUM: Magnesium: 1.7 mg/dL (ref 1.7–2.4)

## 2019-03-06 LAB — DIGOXIN LEVEL: Digoxin Level: 0.5 ng/mL — ABNORMAL LOW (ref 0.8–2.0)

## 2019-03-06 MED ORDER — LOSARTAN POTASSIUM 25 MG PO TABS: 12.5000 mg | ORAL_TABLET | Freq: Every day | ORAL | 1 refills | Status: DC

## 2019-03-06 MED ORDER — TORSEMIDE 20 MG PO TABS: 40.0000 mg | ORAL_TABLET | Freq: Every day | ORAL | 1 refills | Status: DC

## 2019-03-06 NOTE — Patient Instructions (Addendum)
Make an appointment with Vein and V[facility]- Dr Oneida Alar for a follow up.   Labs today We will only contact you if something comes back abnormal or we need to make some changes. Otherwise no news is good news!  EKG done today  Your physician recommends that you schedule a follow-up appointment in: 3 months with Dr Aundra Dubin. You will be called to schedule this appointment.   At the [facility], you and your health needs are our priority. As part of our continuing mission to provide you with exceptional heart care, we have created designated Provider Care Teams. These Care Teams include your primary Cardiologist (physician) and Advanced Practice Providers (APPs- Physician Assistants and Nurse Practitioners) who all work together to provide you with the care you need, when you need it.   You may see any of the following providers on your designated Care Team at your next follow up: [name] Dr Glori Bickers . Dr Loralie Champagne . Darrick Grinder, NP

## 2019-03-06 NOTE — Telephone Encounter (Signed)
-----   Message from Larey Dresser, MD sent at 03/06/2019 12:27 PM EDT ----- Creatinine is up from baseline.  Decrease losartan to 12.5 mg daily. Decrease torsemide to 40 mg daily.  BMET 1 week.

## 2019-03-06 NOTE — Progress Notes (Signed)
Date:  03/06/2019   ID:  Lisa [name], DOB Sep 08, 1946, MRN [id number]  Location: Home  Provider location: [place] [facility] Type of Visit: Established patient  PCP:  [name], MD  Cardiologist:  Loralie Champagne, MD  Chief Complaint: Chest pain   History of Present Illness: Lisa [name] is a 73 y.o. female with a history of PAD, COPD, carotid stenosis, and chronic systolic CHF.    Patient has a long history of vascular disease.  She had left-right fem-fem bypass in 3/16. She has moderate bilateral carotid stenosis.  Echo in 2/18 showed EF 20-25%.  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  She was admitted in 9/18 to the hospital in [facility] for about 4 days with acute on chronic systolic CHF.    RHC/LHC in 12/18 showed nonobstructive coronary disease, low filling pressures, and CI 2.0.   Admitted 09/20/17 with volume overload. Diuresed with IV lasix. Weight went down 2 pounds. Discharged on torsemide 40 mg daily. Discharge weight 179 pounds.   Echo in 12/18 with EF 20-25%, mild basal ventricular hypertrophy, severe mid to apical LV hypertrophy.  Repeat echo in 2/19 was done with Definity contrast, showing that what was thought to be apical hypertrophy was actually prominent mid-apical trabeculation suggestive of noncompaction.   She was noted to have Bence-Jones protein on UPEP.  However, SPEP was negative and 24 hours UPEP was also negative. Abdominal fat pad biopsy did not show evidence of amyloidosis.  She saw heme-onc, probably does not have AL amyloidosis.  However, in 2/19, she had TcPYP scan that was strongly suggestive of transthyretin amyloidosis. She has peripheral neuropathy that may be due to amyloidosis as well. 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   Admitted 6/24-6/26/19 for LLL PNA. She received IV abx and prednisone. BP medications were held, but restarted at discharge.   Echo in 11/19  showed EF 30-35%, mild LVH, mild-moderate MR.   Cardiolite in 5/20 showed EF 30%, no ischemia or infarction.   She returns for followup of CHF.  No dyspnea walking on flat ground.  No orthopnea/PND.  Weight is up a couple of pounds since last appointment.  She was started on Protonix for chest pain while lying in bed. This has helped a lot and the chest pain has mostly resolved.  Main complaint continues to be bilateral leg and foot pain.  Her hands also tingle.  She is on gabapentin for neuropathy.  No lightheadedness. She is taking tafamidis for transthyretin amyloidosis.   Labs (8/18): LDL 80, HDL 46 Labs (10/18): K 4.2, creatinine 1.1 Labs (11/18): digoxin 0.3 Labs (12/18): K 3.5, creatinine 1.02, UPEP with faint Bence-Jones protein but 24 hour UPEP negative and SPEP negative.  Labs (1/19): LDL 79, HDL 38, BNP 493, hgb 13.6, K 5.8 => 4.8, creatinine 1.18, digoxin 0.3 Labs (2/19): K 3.3, creatinine 1.06 Labs (3/19): digoxin < 0.2 Labs (4/19): K 5, creatinine 0.97 Labs (6/19): K 3.5, creatinine 1.19 Labs (7/19): digoxin < 0.2, K 4.4, creatinine 1.14 Labs (2/20): K 4.2, creatinine 1.19 Labs (5/20): LDL 79, K 4.2, creatinine 1.47, digoxin level 0.5  ECG (personally reviewed): NSR, 1st degree AV block, LAFB, poor RWP  PMH: 1. PAD: L=>R fem-fem bypass in 3/16.   - ABIs (3/18): Mildly abnormal, 0.81 left and 0.85 right.  - ABIs (4/19): 0.68 left and 0.66 right 2. H/o appendectomy 3. COPD: Quit smoking in 2/12.  - High resolution CT chest (2/19): No  interstitial lung disease, +emphysema.  4. Arthritis: Bilateral TKR 5. Diet-controlled diabetes 6. Hyperlipidemia 7. Carotid stenosis:  - 10/18 Carotid dopplers: 60-79% RICA stenosis, 81-85% LICA stenosis, left subclavian steal.  - 4/19 Carotid dopplers: 63-14% RICA, 97-02% LICA, left subclavian stenosis.  8. Chronic systolic CHF: Echo (6/37) with EF 20-25%, mild LV dilation, normal RV size and systolic function. 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 .  - LHC (10/17): Nonobstructive CAD.  - Biotronik ICD.  - LHC/RHC (12/18): 50% proximal PLOM (no obstructive disease). Mean RA 1, PA 32/10 mean 19, mean PCWP 7, CI 2.0.  - Echo (12/18): EF 20-25%, mild basal LV hypertrophy, severe mid to apical LV hypertrophy, moderate diastolic dysfunction, mild MR, PASP 42 mmHg.  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  - Workup appears negative for AL amyloidosis.  Negative abdominal fat pad biopsy.  However, PYP scan was suggestive of transthyretin amyloidosis.  - Echo (11/19): EF 30-35%, mild LVH, mild-moderate MR, RV normal.  - Cardiolite (5/20): EF 30%, no ischemia/infarction.  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  10. Peripheral neuropathy: ?due to amyloidosis.  11. Gout 12. H/o Bell's palsy 13. GERD  Current Outpatient Medications  Medication Sig Dispense Refill   albuterol (PROVENTIL HFA;VENTOLIN HFA) 108 (90 Base) MCG/ACT inhaler Inhale 2 puffs into the lungs every 6 (six) hours as needed for wheezing or shortness of breath. 1 Inhaler 6   albuterol (PROVENTIL) (2.5 MG/3ML) 0.083% nebulizer solution Take 3 mLs by nebulization every 4 (four) hours as needed for wheezing.     allopurinol (ZYLOPRIM) 100 MG tablet Take 100 mg by mouth daily.     aspirin EC 81 MG tablet Take 162 mg by mouth daily.     atorvastatin (LIPITOR) 20 MG tablet TAKE 1 TABLET BY MOUTH EVERY DAY 30 tablet 5   carvedilol (COREG) 12.5 MG tablet TAKE 1 TABLET (12.5 MG TOTAL) BY MOUTH 2 (TWO) TIMES DAILY WITH A MEAL. 180 tablet 1   Cyanocobalamin (B-12) 1000 MCG/ML KIT Inject 1,000 mg as directed every 30 (thirty) days.     digoxin (LANOXIN) 0.125 MG tablet TAKE 1/2  TABLET BY MOUTH DAILY 45 tablet 0   fluticasone furoate-vilanterol (BREO ELLIPTA) 200-25 MCG/INH AEPB Inhale 1 puff into the lungs daily.     glimepiride (AMARYL) 2 MG tablet Take 2 mg by mouth daily.     levothyroxine (SYNTHROID, LEVOTHROID) 25 MCG tablet Take 25 mcg by mouth daily before breakfast.     metFORMIN (GLUCOPHAGE-XR) 500 MG 24 hr tablet Take 500 mg by mouth 2 (two) times daily.     NORCO 5-325 MG tablet Take 5-325 mg by mouth as needed.     pantoprazole (PROTONIX) 40 MG tablet Take 1 tablet (40 mg total) by mouth at bedtime. 30 tablet 11   pregabalin (LYRICA) 150 MG capsule Take 150 mg by mouth 2 (two) times daily.     spironolactone (ALDACTONE) 25 MG tablet Take 1 tablet (25 mg total) by mouth daily. 90 tablet 3   losartan (COZAAR) 25 MG tablet Take 0.5 tablets (12.5 mg total) by mouth daily. 45 tablet 1   torsemide (DEMADEX) 20 MG tablet Take 2 tablets (40 mg total) by mouth daily. 180 tablet 1   No current facility-administered medications for this encounter.  Allergies:   Oxycodone and Entresto [sacubitril-valsartan]   Social History:  The patient  reports that she quit smoking about 8 years ago. Her smoking use included cigarettes. She has never used smokeless tobacco. She reports that she does not drink alcohol or use drugs.   Family History:  The patient's family history includes Heart disease in her mother; Hypertension in her mother.   ROS:  Please see the history of present illness.   All other systems are personally reviewed and negative.   Exam:   BP 102/68    Pulse 93    Wt 83.3 kg (183 lb 9.6 oz)    SpO2 94%    BMI 30.55 kg/m  General: NAD Neck: No JVD, no thyromegaly or thyroid nodule.  Lungs: Clear to auscultation bilaterally with normal respiratory effort. CV: Nondisplaced PMI.  Heart regular S1/S2, no S3/S4, 1/6 SEM RUSB.  No peripheral edema.  No carotid bruit.  Feet warm but unable to palpate pedal pulses.  Abdomen: Soft, nontender, no  hepatosplenomegaly, no distention.  Skin: Intact without lesions or rashes.  Neurologic: Alert and oriented x 3.  Psych: Normal affect. Extremities: No clubbing or cyanosis.  HEENT: Normal.   Recent Labs: 07/27/2018: ALT 33; TSH 10.099 10/08/2018: B Natriuretic Peptide 392.0; Hemoglobin 12.5; Platelets 302 03/06/2019: BUN 46; Creatinine, Ser 1.77; Magnesium 1.7; Potassium 4.9; Sodium 135  Personally reviewed   Wt Readings from Last 3 Encounters:  03/06/19 83.3 kg (183 lb 9.6 oz)  11/04/18 81.6 kg (180 lb)  10/13/18 86.7 kg (191 lb 1.6 oz)      ASSESSMENT AND PLAN:  1. Chronic systolic CHF: Nonischemic cardiomyopathy.  EF 20-25% on echo in 2/18.  LHC/RHC in 12/18 with low filling pressures, CI 2.0, and nonobstructive CAD. She has a Biotronik ICD.  Also of note, daughter and nephew have sarcoidosis => patient had a chest CT in 2/19 without evidence for pulmonary sarcoidosis.  We are unable to do an MRI to look for infiltrative disease due to ICD.  Echo in 12/18 showed EF 20-25%, noted to have LVH pattern that is mild in the basal segments and moderate to severe in the apical segments. This was concerning for apical HCM and study was repeated in 2/19 with Definity contrast.  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 We also did a workup for amyloidosis. She did not have evidence for AL amyloidosis, but TcPYP scan in 2/19 was strongly suggestive of transthyretin amyloidosis.  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 Genetic testing was negative, suggesting wild-type TTR amyloidosis. Most recent echo in 11/19 showed EF 30-35%. NYHA class II symptoms with stable weight.  - Continue torsemide 60 mg daily. BMET today.  - She did not tolerate low dose Entresto. Continue losartan 25 mg daily.   - Continue spironolactone 25 mg daily.  -  Continue digoxin, check digoxin level.  - Continue Coreg 12.5 mg bid.   - Narrow QRS, not CRT candidate.   2. PAD: s/p fem-fem bypass, followed by [facility].  Last ABIs somewhat worse in 4/19. Pain in legs seems more consistent with neuropathy than claudication.  - She will need followup with Dr. Oneida Alar for repeat peripheral arterial dopplers (will see if we can arrange followup appt).  3. Carotid stenosis: Moderate stenosis on dopplers, due for repeat study. Follows with [facility]. No change 4. Hyperlipidemia: Continue atorvastatin, goal LDL < 70 given vascular  disease. LDL ok in 5/20.  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 She has peripheral neuropathy.  Genetic testing negative, wild-type.  - Continue tafamidis.  6. GERD: Nocturnal chest pain significant improved with Protonix, would continue.  Cardiolite in 5/20 showed no ischemia.   Followup in 3 months.   Signed, Loralie Champagne, MD  03/06/2019  [facility] [address] [facility] [place] [address] [phone] (office) [phone] (fax)

## 2019-03-06 NOTE — Telephone Encounter (Signed)
Creatinine 1.7.  Pt made aware of lab results. MD recommendations to decrease losartan to 12.5mg  and Torsemide to 40mg . Pt able to repeat back instructions and verbalize understanding. Appt made for 1 week for repeat bmet.

## 2019-03-13 ENCOUNTER — Other Ambulatory Visit: Payer: Self-pay

## 2019-03-13 ENCOUNTER — Ambulatory Visit (HOSPITAL_COMMUNITY)
Admission: RE | Admit: 2019-03-13 | Discharge: 2019-03-13 | Disposition: A | Discharge status: Home / Self Care | Payer: Medicare Other | Source: Ambulatory Visit | Attending: Internal Medicine | Admitting: Internal Medicine

## 2019-03-13 DIAGNOSIS — I5022 Chronic systolic (congestive) heart failure: Secondary | ICD-10-CM | POA: Insufficient documentation

## 2019-03-13 LAB — BASIC METABOLIC PANEL WITH GFR
Anion gap: 10 (ref 5–15)
BUN: 27 mg/dL — ABNORMAL HIGH (ref 8–23)
CO2: 29 mmol/L (ref 22–32)
Calcium: 9.8 mg/dL (ref 8.9–10.3)
Chloride: 101 mmol/L (ref 98–111)
Creatinine, Ser: 1.24 mg/dL — ABNORMAL HIGH (ref 0.44–1.00)
GFR calc Af Amer: 51 mL/min — ABNORMAL LOW
GFR calc non Af Amer: 44 mL/min — ABNORMAL LOW
Glucose, Bld: 185 mg/dL — ABNORMAL HIGH (ref 70–99)
Potassium: 4.8 mmol/L (ref 3.5–5.1)
Sodium: 140 mmol/L (ref 135–145)

## 2019-04-07 ENCOUNTER — Other Ambulatory Visit: Payer: Self-pay

## 2019-04-07 ENCOUNTER — Encounter

## 2019-04-07 ENCOUNTER — Ambulatory Visit (INDEPENDENT_AMBULATORY_CARE_PROVIDER_SITE_OTHER): Payer: Medicare Other | Admitting: Neurology

## 2019-04-07 ENCOUNTER — Encounter: Payer: Self-pay | Admitting: Neurology

## 2019-04-07 VITALS — BP 102/62 | HR 59 | Temp 96.7°F | Ht 65.0 in | Wt 181.0 lb

## 2019-04-07 DIAGNOSIS — R202 Paresthesia of skin: Secondary | ICD-10-CM

## 2019-04-07 NOTE — Progress Notes (Signed)
Reason for visit: Dizziness, peripheral neuropathy  Referring physician: Dr. Tanda Rockers is a 73 y.o. female  History of present illness:  Ms. Eckmann is a 73 year old right-handed black female with a history of diabetes and a diagnosis of a diabetic peripheral neuropathy, previously followed by Dr. Doy Hutching.  The patient indicates that she has had symptoms involving mainly the right foot dating back about 7 years, she had borderline diabetes around that time but just in the last 2 years she has been diagnosed with diabetes and recently was placed on insulin for this.  The patient has other medical issues, she has a cardiomyopathy that may be associated with a transthyretin amyloidosis.  The patient is on medical therapy for this.  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  The patient is followed through vascular surgery for this.  There is retrograde flow in the left vertebral artery, the patient however denies any significant issues with syncope or severe dizziness, CT of the brain done has shown a chronic small left cerebellar infarct.  The patient has had some problems with dizziness in the past, but she claims that when the Lyrica dose was decreased and she was switched from oxycodone to hydrocodone, her dizziness essentially disappeared, she is no longer having any symptoms in this regard.  She is mainly concerned about her foot pain which she claims is more when she is up on her feet and is very asymmetric, much more prominent in the right foot than the left.  She has got new tennis shoes with better arch support and this has significantly improved the pain with walking.  When she is off of her feet, the pain goes away.  She does note some tingling sensations in the toes at times, she may have some tingling in hands that occurs in the evenings or if she is talking on the telephone with her elbow flexed  for long periods of time.  The patient also has a history of gout.  She has been using topical CBD oil for her feet with some benefit.  She reports that first thing in the morning before she gets out of bed she has no foot pain, when she first bears weight on her feet, the pain comes on immediately.  She denies any significant back pain or pain down the legs, she does have bilateral knee arthritis and bilateral knee surgery.  She denies issues controlling bowels or the bladder, she does note some mild balance issues.  She denies any weakness of the extremities.  She is sent to this office for an evaluation.  The patient has a cardiac pacemaker in place.  Past Medical History:  Diagnosis Date   AICD (automatic cardioverter/defibrillator) present 2014   Biotroniks   Arthritis    knees with arthritis as well as has back, pt. remarks that her hands fall asleep when she is in the bed or in certain positions    Bell's palsy    Carotid artery disease ([place])    a. duplex - moderate bilateral carotid disease (60-79% 06/2017).   Chronic systolic CHF (congestive heart failure) (HCC)    COPD (chronic obstructive pulmonary disease) (HCC)    Diabetes mellitus without complication (HCC)    borderline   Dyspnea    Former tobacco use    GERD (gastroesophageal reflux disease)    uses otc for occas. heartburn    Hyperlipidemia    Hypertension  Mild CAD    a. nonobstructive CAD (50% PLOM 08/2017).   Neuropathy    Non-ischemic cardiomyopathy (HCC)    PAD (peripheral artery disease) ([place])    a.  s/p L-R fem-fem BPG 2016.   Pneumonia    hosp. in [place]-2015   Subclavian steal syndrome    a. h/o left subclavian steal.    Past Surgical History:  Procedure Laterality Date   ABDOMINAL AORTAGRAM N/A 11/27/2014   Procedure: ABDOMINAL Maxcine Ham;  Surgeon: Elam Dutch, MD;  Location: [facility] CATH LAB;  Service: Cardiovascular;  Laterality: N/A;   ABDOMINAL HYSTERECTOMY     APPENDECTOMY      CARDIAC CATHETERIZATION     02/20/13: Normal coronaries, moderate LV dysfunction, EF 35%. Medical RX ([facility])   CARDIAC DEFIBRILLATOR PLACEMENT  Aug. 2014   dual chamber Biotronik ICD 05/06/13 ([facility], Dr. Minna Merritts)   ENDARTERECTOMY FEMORAL Right 12/21/2014   Procedure: ENDARTERECTOMY FEMORAL;  Surgeon: Elam Dutch, MD;  Location: [facility];  Service: Vascular;  Laterality: Right;   FEMORAL-FEMORAL BYPASS GRAFT Bilateral 12/21/2014   Procedure: BYPASS GRAFT LEFT FEMORAL-RIGHT FEMORAL ARTERY;  Surgeon: Elam Dutch, MD;  Location: [facility];  Service: Vascular;  Laterality: Bilateral;   JOINT REPLACEMENT Bilateral    implants   RIGHT/LEFT HEART CATH AND CORONARY ANGIOGRAPHY N/A 08/28/2017   Procedure: RIGHT/LEFT HEART CATH AND CORONARY ANGIOGRAPHY;  Surgeon: Larey Dresser, MD;  Location: [facility] CV LAB;  Service: Cardiovascular;  Laterality: N/A;   TUBAL LIGATION      Family History  Problem Relation Age of Onset   Heart disease Mother    Hypertension Mother     Social history:  reports that she quit smoking about 8 years ago. Her smoking use included cigarettes. She has never used smokeless tobacco. She reports that she does not drink alcohol or use drugs.  Medications:  Prior to Admission medications   Medication Sig Start Date End Date Taking? Authorizing Provider  albuterol (PROVENTIL HFA;VENTOLIN HFA) 108 (90 Base) MCG/ACT inhaler Inhale 2 puffs into the lungs every 6 (six) hours as needed for wheezing or shortness of breath. 03/20/18  Yes Lockamy, Timothy, DO  albuterol (PROVENTIL) (2.5 MG/3ML) 0.083% nebulizer solution Take 3 mLs by nebulization every 4 (four) hours as needed for wheezing. 08/01/17  Yes [provider]  allopurinol (ZYLOPRIM) 100 MG tablet Take 100 mg by mouth daily.   Yes [provider]  aspirin EC 81 MG tablet Take 162 mg by mouth daily.   Yes [provider]  atorvastatin (LIPITOR) 20 MG tablet TAKE 1 TABLET BY MOUTH EVERY DAY  02/18/19  Yes Larey Dresser, MD  carvedilol (COREG) 12.5 MG tablet TAKE 1 TABLET (12.5 MG TOTAL) BY MOUTH 2 (TWO) TIMES DAILY WITH A MEAL. 12/25/18  Yes Larey Dresser, MD  Cyanocobalamin (VITAMIN B 12 PO) Take by mouth. Gummy   Yes [provider]  digoxin (LANOXIN) 0.125 MG tablet TAKE 1/2 TABLET BY MOUTH DAILY 01/16/19  Yes Larey Dresser, MD  fluticasone furoate-vilanterol (BREO ELLIPTA) 200-25 MCG/INH AEPB Inhale 1 puff into the lungs daily.   Yes [provider]  glimepiride (AMARYL) 2 MG tablet Take 2 mg by mouth daily. 07/11/17  Yes [provider]  levothyroxine (SYNTHROID, LEVOTHROID) 25 MCG tablet Take 25 mcg by mouth daily before breakfast.   Yes [provider]  losartan (COZAAR) 25 MG tablet Take 0.5 tablets (12.5 mg total) by mouth daily. 03/06/19  Yes Larey Dresser, MD  metFORMIN (  GLUCOPHAGE-XR) 500 MG 24 hr tablet Take 500 mg by mouth 2 (two) times daily. 10/03/18  Yes [provider]  NORCO 5-325 MG tablet Take 5-325 mg by mouth as needed. 10/25/18  Yes [provider]  pantoprazole (PROTONIX) 40 MG tablet Take 1 tablet (40 mg total) by mouth at bedtime. 01/27/19  Yes Larey Dresser, MD  pregabalin (LYRICA) 150 MG capsule Take 150 mg by mouth 2 (two) times daily.   Yes [provider]  spironolactone (ALDACTONE) 25 MG tablet Take 1 tablet (25 mg total) by mouth daily. 01/27/19  Yes Larey Dresser, MD  Tafamidis Meglumine, Cardiac, (VYNDAQEL) 20 MG CAPS Take 4 daily 03/27/19  Yes [provider]  torsemide (DEMADEX) 20 MG tablet Take 2 tablets (40 mg total) by mouth daily. 03/06/19  Yes Larey Dresser, MD      Allergies  Allergen Reactions   Oxycodone Shortness Of Breath   Entresto [Sacubitril-Valsartan] Other (See Comments)    Impaired coordination---causes patient to drop many things, blood pressure bottomed out    ROS:  Out of a complete 14 system review of symptoms, the patient complains only of  the following symptoms, and all other reviewed systems are negative.  Foot pain Numbness  Blood pressure 102/62, pulse (!) 59, temperature (!) 96.7 F (35.9 C), temperature source Temporal, height 5\' 5"  (1.651 m), weight 181 lb (82.1 kg).  Physical Exam  General: The patient is alert and cooperative at the time of the examination.  Eyes: Pupils are equal, round, and reactive to light. Discs are flat bilaterally.  Neck: The neck is supple, bilateral, right greater than left carotid bruits are noted.  Respiratory: The respiratory examination is clear.  Cardiovascular: The cardiovascular examination reveals a regular rate and rhythm, no obvious murmurs or rubs are noted.  Skin: Extremities are without significant edema.  Neurologic Exam  Mental status: The patient is alert and oriented x 3 at the time of the examination. The patient has apparent normal recent and remote memory, with an apparently normal attention span and concentration ability.  Cranial nerves: Facial symmetry is present. There is good sensation of the face to pinprick and soft touch bilaterally. The strength of the facial muscles and the muscles to head turning and shoulder shrug are normal bilaterally. Speech is well enunciated, no aphasia or dysarthria is noted. Extraocular movements are full. Visual fields are full. The tongue is midline, and the patient has symmetric elevation of the soft palate. No obvious hearing deficits are noted.  Motor: The motor testing reveals 5 over 5 strength of all 4 extremities. Good symmetric motor tone is noted throughout.  Sensory: Sensory testing is intact to pinprick, soft touch, vibration sensation, and position sense on the upper extremities.  With the lower extremities, there may be a stocking pattern pinprick sensory deficit up to the knees bilaterally but vibration sensation and position sensation are intact on both feet.  No evidence of extinction is noted.  Coordination:  Cerebellar testing reveals good finger-nose-finger and heel-to-shin bilaterally.  Gait and station: Gait is normal. Tandem gait is unsteady. Romberg is negative. No drift is seen.  Reflexes: Deep tendon reflexes are symmetric and normal bilaterally.  The ankle jerk reflexes are well-maintained bilaterally.  Toes are downgoing bilaterally.   Assessment/Plan:  1.  Bilateral foot pain, likely mechanical source pain  2.  History of diabetic peripheral neuropathy  3.  Cardiomyopathy associated with transthyretin amyloidosis  4.  Subclavian steal, left subclavian stenosis  5.  Bilateral carotid bruits  6.  History of dizziness, resolved  The history that the patient gives a somewhat atypical for foot pain related to a peripheral neuropathy.  There is significant asymmetry to her symptoms, the pain is worse on the right foot and is associated with weightbearing and is worse when she first gets up out of bed in the morning and bears weight.  This pattern is most consistent with a mechanical source pain such as arthritis or plantar fasciitis.  The patient also has well-maintained ankle jerk reflexes.  If she does have a peripheral neuropathy the neuropathy, it is likely to be quite mild.  We will check nerve conduction studies on both legs and one arm, the patient may have carpal tunnel syndrome.  EMG study will be done on the right leg.  We may consider blood work depending upon the results of the above evaluation.  We may consider a podiatry referral in the future.  The patient claims that her dizziness issue has completely resolved.  She has been set up for a repeat carotid Doppler study at the end of this month.  She will follow-up otherwise in 4 months.  I have recommended getting arch supports for her shoes to improve the mechanical foot pain.  Jill Alexanders MD 04/07/2019 9:39 AM  [facility] [address] [place], [place] [address]  Phone [phone] Fax  [phone]

## 2019-04-09 ENCOUNTER — Other Ambulatory Visit (HOSPITAL_COMMUNITY): Payer: Self-pay | Admitting: Cardiology

## 2019-04-25 ENCOUNTER — Encounter (HOSPITAL_COMMUNITY): Payer: Medicare Other

## 2019-04-25 ENCOUNTER — Ambulatory Visit (HOSPITAL_COMMUNITY): Payer: Medicare Other | Attending: Family

## 2019-04-25 ENCOUNTER — Ambulatory Visit: Payer: Medicare Other | Admitting: Family

## 2019-04-25 ENCOUNTER — Encounter: Payer: Self-pay | Admitting: Family

## 2019-05-05 ENCOUNTER — Encounter: Payer: Medicare Other | Admitting: Cardiology

## 2019-05-08 ENCOUNTER — Other Ambulatory Visit (HOSPITAL_COMMUNITY): Payer: Self-pay | Admitting: *Deleted

## 2019-05-08 MED ORDER — TORSEMIDE 20 MG PO TABS: 40.0000 mg | ORAL_TABLET | Freq: Every day | ORAL | 1 refills | Status: DC

## 2019-05-14 ENCOUNTER — Ambulatory Visit (INDEPENDENT_AMBULATORY_CARE_PROVIDER_SITE_OTHER): Payer: Medicare Other | Admitting: *Deleted

## 2019-05-14 DIAGNOSIS — I429 Cardiomyopathy, unspecified: Secondary | ICD-10-CM | POA: Diagnosis not present

## 2019-05-14 LAB — CUP PACEART REMOTE DEVICE CHECK
Date Time Interrogation Session: 20200819171646
Implantable Lead Implant Date: 20140812
Implantable Lead Implant Date: 20140812
Implantable Lead Location: 753859
Implantable Lead Location: 753860
Implantable Lead Model: 350
Implantable Lead Model: 375
Implantable Lead Serial Number: 10491693
Implantable Lead Serial Number: 29451060
Implantable Pulse Generator Implant Date: 20140812
Pulse Gen Model: 383562
Pulse Gen Serial Number: 60721404

## 2019-05-22 ENCOUNTER — Encounter: Payer: Self-pay | Admitting: Cardiology

## 2019-05-22 NOTE — Progress Notes (Signed)
Remote ICD transmission.   

## 2019-05-26 ENCOUNTER — Ambulatory Visit (INDEPENDENT_AMBULATORY_CARE_PROVIDER_SITE_OTHER): Payer: Medicare Other | Admitting: Neurology

## 2019-05-26 ENCOUNTER — Encounter: Payer: Self-pay | Admitting: Neurology

## 2019-05-26 ENCOUNTER — Other Ambulatory Visit: Payer: Self-pay

## 2019-05-26 DIAGNOSIS — R202 Paresthesia of skin: Secondary | ICD-10-CM

## 2019-05-26 DIAGNOSIS — E1142 Type 2 diabetes mellitus with diabetic polyneuropathy: Secondary | ICD-10-CM

## 2019-05-26 DIAGNOSIS — E1140 Type 2 diabetes mellitus with diabetic neuropathy, unspecified: Secondary | ICD-10-CM

## 2019-05-26 DIAGNOSIS — E538 Deficiency of other specified B group vitamins: Secondary | ICD-10-CM

## 2019-05-26 HISTORY — DX: Type 2 diabetes mellitus with diabetic neuropathy, unspecified: E11.40

## 2019-05-26 MED ORDER — DULOXETINE HCL 30 MG PO CPEP: 30.0000 mg | ORAL_CAPSULE | Freq: Every day | ORAL | 3 refills | Status: DC

## 2019-05-26 NOTE — Progress Notes (Signed)
Please refer to EMG and nerve conduction procedure note.  

## 2019-05-26 NOTE — Progress Notes (Addendum)
The patient comes in today for EMG nerve conduction study evaluation.  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  She has bilateral carpal tunnel syndrome.  She is reporting foot pain on the right primarily, this increases with weightbearing.  The patient claims that she has been to a podiatry doctor and was told she did not have any mechanical source pain in the foot.  The patient takes 100 mg of Lyrica at night, this could be increased, Cymbalta could be added to this.  The patient will have blood work done today.  The patient indicates that she cannot tolerate higher doses of Lyrica, it makes her too staggery.  We will try low-dose Cymbalta.        [place]    Nerve / Sites Muscle Latency Ref. Amplitude Ref. Rel Amp Segments Distance Velocity Ref. Area    ms ms mV mV %  cm m/s m/s mVms  L Median - APB     Wrist APB 4.9 ?4.4 5.5 ?4.0 100 Wrist - APB 7   17.4     Upper arm APB 9.9  4.9  89.3 Upper arm - Wrist 22 44 ?49 15.4  R Median - APB     Wrist APB 4.7 ?4.4 5.7 ?4.0 100 Wrist - APB 7   17.6     Upper arm APB 9.3  4.6  81 Upper arm - Wrist 22 49 ?49 15.5  L Ulnar - ADM     Wrist ADM 2.4 ?3.3 10.2 ?6.0 100 Wrist - ADM 7   32.4     B.Elbow ADM 6.2  9.7  94.7 B.Elbow - Wrist 19 51 ?49 32.9     A.Elbow ADM 8.2  9.2  95.2 A.Elbow - B.Elbow 10 49 ?49 31.6         A.Elbow - Wrist      R Peroneal - EDB     Ankle EDB 4.4 ?6.5 2.9 ?2.0 100 Ankle - EDB 9   8.5     Fib head EDB 11.7  2.6  90.7 Fib head - Ankle 28 38 ?44 10.7     Pop fossa EDB 14.8  1.7  64.6 Pop fossa - Fib head 10 33 ?44 7.5         Pop fossa - Ankle      L Peroneal - EDB     Ankle EDB 3.8 ?6.5 4.7 ?2.0 100 Ankle - EDB 9   13.3     Fib head EDB 12.0  3.8  80.2 Fib head - Ankle 28 34 ?44 11.3     Pop fossa EDB 14.3  3.7  96.5 Pop fossa - Fib head 10 43 ?44 11.3         Pop fossa - Ankle      R Tibial - AH     Ankle AH 3.8 ?5.8 4.0 ?4.0 100 Ankle - AH 9   9.9     Pop fossa AH  14.9  1.9  45.9 Pop fossa - Ankle 37 33 ?41 6.9  L Tibial - AH     Ankle AH 4.1 ?5.8 5.0 ?4.0 100 Ankle - AH 9   11.6     Pop fossa AH 14.3  3.3  67.1 Pop fossa - Ankle 36 35 ?41 13.2                      SNC    Nerve / Sites Rec.  Site Peak Lat Ref.  Amp Ref. Segments Distance    ms ms V V  cm  R Sural - Ankle (Calf)     Calf Ankle 4.1 ?4.4 3 ?6 Calf - Ankle 14  L Sural - Ankle (Calf)     Calf Ankle 3.9 ?4.4 8 ?6 Calf - Ankle 14  R Superficial peroneal - Ankle     Lat leg Ankle NR ?4.4 NR ?6 Lat leg - Ankle 14  L Superficial peroneal - Ankle     Lat leg Ankle 4.3 ?4.4 4 ?6 Lat leg - Ankle 14  L Median - Orthodromic (Dig II, Mid palm)     Dig II Wrist 4.2 ?3.4 7 ?10 Dig II - Wrist 13  R Median - Orthodromic (Dig II, Mid palm)     Dig II Wrist 3.8 ?3.4 7 ?10 Dig II - Wrist 13  L Ulnar - Orthodromic, (Dig V, Mid palm)     Dig V Wrist 2.8 ?3.1 6 ?5 Dig V - Wrist 15                   F  Wave    Nerve F Lat Ref.   ms ms  R Tibial - AH 62.1 ?56.0  L Tibial - AH 57.2 ?56.0  L Ulnar - ADM 31.8 ?32.0

## 2019-05-26 NOTE — Procedures (Signed)
     HISTORY:  Lisa [name] is a 73 year old patient with a history of diabetes who reports some numbness in the hands and feet, she has right foot and leg pain with weightbearing.  She is being evaluated for this.  She denies any back pain or pain down the legs.  NERVE CONDUCTION STUDIES:  Nerve conduction studies were performed on both upper extremities.  The distal motor latencies for the median nerves were prolonged bilaterally with normal motor amplitudes for these nerves bilaterally.  Slowing was seen for the left median nerve, normal on the right.  The distal motor latency and motor amplitudes for the left ulnar nerve were normal with normal nerve conduction velocities seen for this nerve.  The sensory latencies for the median nerves were prolonged bilaterally, normal for the left ulnar nerve.  The F-wave latency for the left ulnar nerve was normal.  Nerve conduction studies were performed on both lower extremities.  The distal motor latencies and motor amplitudes for the peroneal and posterior tibial nerves were normal bilaterally with slowing seen for these nerves bilaterally.  The sensory latencies for the sural nerves were normal bilaterally, unobtainable for the right peroneal nerve, normal for the left peroneal nerve.  The F-wave latencies for the posterior tibial nerves were prolonged bilaterally.  EMG STUDIES:  EMG study was performed on the right lower extremity:  The tibialis anterior muscle reveals 2 to 4K motor units with full recruitment. No fibrillations or positive waves were seen. The peroneus tertius muscle reveals 2 to 4K motor units with full recruitment. No fibrillations or positive waves were seen. The medial gastrocnemius muscle reveals 1 to 3K motor units with full recruitment. No fibrillations or positive waves were seen. The vastus lateralis muscle reveals 2 to 4K motor units with full recruitment. No fibrillations or positive waves were seen. The iliopsoas  muscle reveals 2 to 4K motor units with full recruitment. No fibrillations or positive waves were seen. The biceps femoris muscle (long head) reveals 2 to 4K motor units with full recruitment. No fibrillations or positive waves were seen. The lumbosacral paraspinal muscles were tested at 3 levels, and revealed no abnormalities of insertional activity at all 3 levels tested. There was good relaxation.   IMPRESSION:  Nerve conduction studies done on all 4 extremities shows evidence of bilateral carpal tunnel syndrome of mild to moderate severity on the left, mild severity on the right.  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  EMG evaluation of the right lower extremity was unremarkable without evidence of an overlying lumbosacral radiculopathy.  Jill Alexanders MD 05/26/2019 4:07 PM  [facility] [address] [place], [place] [address]  Phone [phone] Fax [phone]

## 2019-05-27 ENCOUNTER — Other Ambulatory Visit: Payer: Self-pay

## 2019-05-27 ENCOUNTER — Ambulatory Visit (HOSPITAL_COMMUNITY)
Admission: RE | Admit: 2019-05-27 | Discharge: 2019-05-27 | Disposition: A | Discharge status: Home / Self Care | Payer: Medicare Other | Source: Ambulatory Visit | Attending: Family | Admitting: Family

## 2019-05-27 ENCOUNTER — Ambulatory Visit (INDEPENDENT_AMBULATORY_CARE_PROVIDER_SITE_OTHER)
Admission: RE | Admit: 2019-05-27 | Discharge: 2019-05-27 | Disposition: A | Discharge status: Home / Self Care | Payer: Medicare Other | Source: Ambulatory Visit | Attending: Family | Admitting: Family

## 2019-05-27 ENCOUNTER — Telehealth: Payer: Self-pay | Admitting: Neurology

## 2019-05-27 DIAGNOSIS — I6523 Occlusion and stenosis of bilateral carotid arteries: Secondary | ICD-10-CM | POA: Diagnosis present

## 2019-05-27 DIAGNOSIS — I779 Disorder of arteries and arterioles, unspecified: Secondary | ICD-10-CM | POA: Diagnosis not present

## 2019-05-27 LAB — SEDIMENTATION RATE: Sed Rate: 15 mm/hr (ref 0–40)

## 2019-05-27 LAB — MULTIPLE MYELOMA PANEL, SERUM
Albumin SerPl Elph-Mcnc: 3.9 g/dL (ref 2.9–4.4)
Albumin/Glob SerPl: 1.2 (ref 0.7–1.7)
Alpha 1: 0.2 g/dL (ref 0.0–0.4)
Alpha2 Glob SerPl Elph-Mcnc: 0.9 g/dL (ref 0.4–1.0)
B-Globulin SerPl Elph-Mcnc: 1.3 g/dL (ref 0.7–1.3)
Gamma Glob SerPl Elph-Mcnc: 1.1 g/dL (ref 0.4–1.8)
Globulin, Total: 3.5 g/dL (ref 2.2–3.9)
IgA/Immunoglobulin A, Serum: 423 mg/dL — ABNORMAL HIGH (ref 64–422)
IgG (Immunoglobin G), Serum: 1313 mg/dL (ref 586–1602)
IgM (Immunoglobulin M), Srm: 106 mg/dL (ref 26–217)
Total Protein: 7.4 g/dL (ref 6.0–8.5)

## 2019-05-27 LAB — ANGIOTENSIN CONVERTING ENZYME: Angio Convert Enzyme: 34 U/L (ref 14–82)

## 2019-05-27 LAB — ANA W/REFLEX: Anti Nuclear Antibody (ANA): NEGATIVE

## 2019-05-27 LAB — B. BURGDORFI ANTIBODIES: Lyme IgG/IgM Ab: <0.91 {ISR} (ref 0.00–0.90)

## 2019-05-27 LAB — VITAMIN B12: Vitamin B-12: 971 pg/mL (ref 232–1245)

## 2019-05-27 MED ORDER — NORTRIPTYLINE HCL 10 MG PO CAPS: 10.0000 mg | ORAL_CAPSULE | Freq: Every day | ORAL | 1 refills | Status: DC

## 2019-05-27 NOTE — Telephone Encounter (Signed)
I returned the pt's call. She reports she has tried Duloxetine in the past and the medication caused her to be loopy and unable to function. Pt wanted to know if any other recommendations could be made?

## 2019-05-27 NOTE — Telephone Encounter (Signed)
Pt called stating that she was to be prescribed DULoxetine (CYMBALTA) 30 MG capsule but she states she is allergic to it. Please advise.

## 2019-05-27 NOTE — Telephone Encounter (Signed)
I called the patient.  The patient remembered that she had taken Cymbalta before and it made her loopy.  She does not wish to take the medication.  I will send in a prescription for low-dose nortriptyline.

## 2019-05-28 ENCOUNTER — Encounter: Payer: Self-pay | Admitting: Family

## 2019-05-28 ENCOUNTER — Other Ambulatory Visit: Payer: Self-pay

## 2019-05-28 ENCOUNTER — Ambulatory Visit (HOSPITAL_COMMUNITY)
Admission: RE | Admit: 2019-05-28 | Discharge: 2019-05-28 | Disposition: A | Discharge status: Home / Self Care | Payer: Medicare Other | Source: Ambulatory Visit | Attending: Family | Admitting: Family

## 2019-05-28 ENCOUNTER — Ambulatory Visit (INDEPENDENT_AMBULATORY_CARE_PROVIDER_SITE_OTHER): Payer: Medicare Other | Admitting: Family

## 2019-05-28 VITALS — BP 109/68 | HR 62 | Temp 97.9°F | Resp 14 | Ht 65.0 in | Wt 177.5 lb

## 2019-05-28 DIAGNOSIS — I779 Disorder of arteries and arterioles, unspecified: Secondary | ICD-10-CM

## 2019-05-28 DIAGNOSIS — I6523 Occlusion and stenosis of bilateral carotid arteries: Secondary | ICD-10-CM | POA: Diagnosis not present

## 2019-05-28 DIAGNOSIS — Z87891 Personal history of nicotine dependence: Secondary | ICD-10-CM | POA: Diagnosis not present

## 2019-05-28 DIAGNOSIS — I739 Peripheral vascular disease, unspecified: Secondary | ICD-10-CM

## 2019-05-28 NOTE — Patient Instructions (Signed)
Stroke Prevention Some medical conditions and lifestyle choices can lead to a higher risk for a stroke. You can help to prevent a stroke by making nutrition, lifestyle, and other changes. What nutrition changes can be made?   Eat healthy foods. ? Choose foods that are high in fiber. These include:  Fresh fruits.  Fresh vegetables.  Whole grains. ? Eat at least 5 or more servings of fruits and vegetables each day. Try to fill half of your plate at each meal with fruits and vegetables. ? Choose lean protein foods. These include:  Lowfat (lean) cuts of meat.  Chicken without skin.  Fish.  Tofu.  Beans.  Nuts. ? Eat low-fat dairy products. ? Avoid foods that:  Are high in salt (sodium).  Have saturated fat.  Have trans fat.  Have cholesterol.  Are processed.  Are premade.  Follow eating guidelines as told by your doctor. These may include: ? Reducing how many calories you eat and drink each day. ? Limiting how much salt you eat or drink each day to 1,500 milligrams (mg). ? Using only healthy fats for cooking. These include:  Olive oil.  Canola oil.  Sunflower oil. ? Counting how many carbohydrates you eat and drink each day. What lifestyle changes can be made?  Try to stay at a healthy weight. Talk to your doctor about what a good weight is for you.  Get at least 30 minutes of moderate physical activity at least 5 days a week. This can include: ? Fast walking. ? Biking. ? Swimming.  Do not use any products that have nicotine or tobacco. This includes cigarettes and e-cigarettes. If you need help quitting, ask your doctor. Avoid being around tobacco smoke in general.  Limit how much alcohol you drink to no more than 1 drink a day for nonpregnant women and 2 drinks a day for men. One drink equals 12 oz of beer, 5 oz of wine, or 1 oz of hard liquor.  Do not use drugs.  Avoid taking birth control pills. Talk to your doctor about the risks of taking birth  control pills if: ? You are over 35 years old. ? You smoke. ? You get migraines. ? You have had a blood clot. What other changes can be made?  Manage your cholesterol. ? It is important to eat a healthy diet. ? If your cholesterol cannot be managed through your diet, you may also need to take medicines. Take medicines as told by your doctor.  Manage your diabetes. ? It is important to eat a healthy diet and to exercise regularly. ? If your blood sugar cannot be managed through diet and exercise, you may need to take medicines. Take medicines as told by your doctor.  Control your high blood pressure (hypertension). ? Try to keep your blood pressure below 130/80. This can help lower your risk of stroke. ? It is important to eat a healthy diet and to exercise regularly. ? If your blood pressure cannot be managed through diet and exercise, you may need to take medicines. Take medicines as told by your doctor. ? Ask your doctor if you should check your blood pressure at home. ? Have your blood pressure checked every year. Do this even if your blood pressure is normal.  Talk to your doctor about getting checked for a sleep disorder. Signs of this can include: ? Snoring a lot. ? Feeling very tired.  Take over-the-counter and prescription medicines only as told by your doctor. These may   include aspirin or blood thinners (antiplatelets or anticoagulants).  Make sure that any other medical conditions you have are managed. Where to find more information  American Stroke Association: www.strokeassociation.org  National Stroke Association: www.stroke.org Get help right away if:  You have any symptoms of stroke. "BE FAST" is an easy way to remember the main warning signs: ? B - Balance. Signs are dizziness, sudden trouble walking, or loss of balance. ? E - Eyes. Signs are trouble seeing or a sudden change in how you see. ? F - Face. Signs are sudden weakness or loss of feeling of the face,  or the face or eyelid drooping on one side. ? A - Arms. Signs are weakness or loss of feeling in an arm. This happens suddenly and usually on one side of the body. ? S - Speech. Signs are sudden trouble speaking, slurred speech, or trouble understanding what people say. ? T - Time. Time to call emergency services. Write down what time symptoms started.  You have other signs of stroke, such as: ? A sudden, very bad headache with no known cause. ? Feeling sick to your stomach (nausea). ? Throwing up (vomiting). ? Jerky movements you cannot control (seizure). These symptoms may represent a serious problem that is an emergency. Do not wait to see if the symptoms will go away. Get medical help right away. Call your local emergency services (911 in the [place] Do not drive yourself to the hospital. Summary  You can prevent a stroke by eating healthy, exercising, not smoking, drinking less alcohol, and treating other health problems, such as diabetes, high blood pressure, or high cholesterol.  Do not use any products that contain nicotine or tobacco, such as cigarettes and e-cigarettes.  Get help right away if you have any signs or symptoms of a stroke. This information is not intended to replace advice given to you by your health care provider. Make sure you discuss any questions you have with your health care provider. Document Released: 03/12/2012 Document Revised: 11/07/2018 Document Reviewed: 12/13/2016 Elsevier Patient Education  2020 [employer]     Peripheral Vascular Disease  Peripheral vascular disease (PVD) is a disease of the blood vessels that are not part of your heart and brain. A simple term for PVD is poor circulation. In most cases, PVD narrows the blood vessels that carry blood from your heart to the rest of your body. This can reduce the supply of blood to your arms, legs, and internal organs, like your stomach or kidneys. However, PVD most often affects a person's lower  legs and feet. Without treatment, PVD tends to get worse. PVD can also lead to acute ischemic limb. This is when an arm or leg suddenly cannot get enough blood. This is a medical emergency. Follow these instructions at home: Lifestyle  Do not use any products that contain nicotine or tobacco, such as cigarettes and e-cigarettes. If you need help quitting, ask your doctor.  Lose weight if you are overweight. Or, stay at a healthy weight as told by your doctor.  Eat a diet that is low in fat and cholesterol. If you need help, ask your doctor.  Exercise regularly. Ask your doctor for activities that are right for you. General instructions  Take over-the-counter and prescription medicines only as told by your doctor.  Take good care of your feet: ? Wear comfortable shoes that fit well. ? Check your feet often for any cuts or sores.  Keep all follow-up visits as told   by your doctor This is important. Contact a doctor if:  You have cramps in your legs when you walk.  You have leg pain when you are at rest.  You have coldness in a leg or foot.  Your skin changes.  You are unable to get or have an erection (erectile dysfunction).  You have cuts or sores on your feet that do not heal. Get help right away if:  Your arm or leg turns cold, numb, and blue.  Your arms or legs become red, warm, swollen, painful, or numb.  You have chest pain.  You have trouble breathing.  You suddenly have weakness in your face, arm, or leg.  You become very confused or you cannot speak.  You suddenly have a very bad headache.  You suddenly cannot see. Summary  Peripheral vascular disease (PVD) is a disease of the blood vessels.  A simple term for PVD is poor circulation. Without treatment, PVD tends to get worse.  Treatment may include exercise, low fat and low cholesterol diet, and quitting smoking. This information is not intended to replace advice given to you by your health care  provider. Make sure you discuss any questions you have with your health care provider. Document Released: 12/06/2009 Document Revised: 08/24/2017 Document Reviewed: 10/19/2016 Elsevier Patient Education  2020 [employer]  

## 2019-05-28 NOTE — Progress Notes (Signed)
[facility] [place] HISTORY AND PHYSICAL   CC: Follow up peripheral artery occlusive disease and extracranial carotid artery stenosis     History of Present Illness:   Lisa [name] is a 73 y.o. female who is s/p left to right femoral-femoral bypass, right femoral endarterectomy on 3/28/16by Dr. Oneida Alar. She also has aknown history of bilateral carotid stenosis.   She returns today forroutine follow up.  She has a history of bilateral total knee replacements. Dr.McLean is her cardiologist, she has CHF. Her PCP is Dr. [name].   She saw Dr. Aundra Dubin on 06-27-18 who evaluatedpt forTransthyretin amyloidosis: he strongly suspects this from TcPYP scan in 2/19. She has peripheral neuropathy. Genetic testing negative, wild-type. Pt states Dr. Aundra Dubin. told her this also affects her kidneys and heart. She is taking tafamidis.   She was riding astationary bike 5 days/week for 30 minutes, then lifting weights,since 2013, but not since her gym closed in the Spring of 2020 due to the pandemic.   She states she haspainfulneuropathy in her feet and legs, and has arthritis pain, takes Lyrica. Otherwise she denies any worse pain in her legs with walking.  She saw Dr. Jannifer Franklin, neurology, in July 2020, and pt states he told her that she had bilateral carpal tunnel syndrome.   She states she cannot walk much due to shortness of breath.  Pt states she knows about thyroid nodules, states needle bx done years ago.   The patient denies any history of TIA or stroke symptoms, specificallyshedenies a history of amaurosis fugax or monocular blindness, unilateral facial drooping, hemiplegia, orreceptive or expressive aphasia.  GFR 51, serum creatinine 1.24 on 03-13-19.  Diabetic:Yes, last A1C result on file was 7.0 on 03-19-18, pt states most recent was 9.?, she then started insulin Tobacco [id number] smoker, quitin 2012, started about age 17years  Pt meds  include: Statin: yes Betablocker:Yes ASA:Yes Other anticoagulants/antiplatelets:no   Current Outpatient Medications  Medication Sig Dispense Refill  . albuterol (PROVENTIL HFA;VENTOLIN HFA) 108 (90 Base) MCG/ACT inhaler Inhale 2 puffs into the lungs every 6 (six) hours as needed for wheezing or shortness of breath. 1 Inhaler 6  . albuterol (PROVENTIL) (2.5 MG/3ML) 0.083% nebulizer solution Take 3 mLs by nebulization every 4 (four) hours as needed for wheezing.    [name] allopurinol (ZYLOPRIM) 100 MG tablet Take 100 mg by mouth daily.    [name] aspirin EC 81 MG tablet Take 162 mg by mouth daily.    [name] atorvastatin (LIPITOR) 20 MG tablet TAKE 1 TABLET BY MOUTH EVERY DAY 30 tablet 5  . carvedilol (COREG) 12.5 MG tablet TAKE 1 TABLET (12.5 MG TOTAL) BY MOUTH 2 (TWO) TIMES DAILY WITH A MEAL. 180 tablet 1  . Cyanocobalamin (VITAMIN B 12 PO) Take by mouth. Gummy    . digoxin (LANOXIN) 0.125 MG tablet TAKE 1/2 TABLET BY MOUTH EVERY DAY 45 tablet 0  . fluticasone furoate-vilanterol (BREO ELLIPTA) 200-25 MCG/INH AEPB Inhale 1 puff into the lungs daily.    [name] glimepiride (AMARYL) 4 MG tablet Take 4 mg by mouth daily with breakfast.    . levothyroxine (SYNTHROID, LEVOTHROID) 25 MCG tablet Take 25 mcg by mouth daily before breakfast.    . losartan (COZAAR) 25 MG tablet Take 0.5 tablets (12.5 mg total) by mouth daily. 45 tablet 1  . metFORMIN (GLUCOPHAGE-XR) 500 MG 24 hr tablet Take 500 mg by mouth 2 (two) times daily.    Lebron Quam 5-325 MG tablet Take 5-325 mg by mouth as  needed.    . nortriptyline (PAMELOR) 10 MG capsule Take 1 capsule (10 mg total) by mouth at bedtime. 30 capsule 1  . pantoprazole (PROTONIX) 40 MG tablet Take 1 tablet (40 mg total) by mouth at bedtime. 30 tablet 11  . pregabalin (LYRICA) 100 MG capsule Take 100 mg by mouth at bedtime.    [name] spironolactone (ALDACTONE) 25 MG tablet Take 1 tablet (25 mg total) by mouth daily. 90 tablet 3  . Tafamidis Meglumine, Cardiac, (VYNDAQEL) 20 MG CAPS Take 4  daily    . torsemide (DEMADEX) 20 MG tablet Take 2 tablets (40 mg total) by mouth daily. 180 tablet 1   No current facility-administered medications for this visit.     Past Medical History:  Diagnosis Date  . AICD (automatic cardioverter/defibrillator) present 2014   Biotroniks  . Arthritis    knees with arthritis as well as has back, pt. remarks that her hands fall asleep when she is in the bed or in certain positions   . Bell's palsy   . Carotid artery disease ([place])    a. duplex - moderate bilateral carotid disease (60-79% 06/2017).  . Chronic systolic CHF (congestive heart failure) ([place])   . COPD (chronic obstructive pulmonary disease) ([place])   . Diabetes mellitus without complication (HCC)    borderline  . Diabetic neuropathy ([place]) 05/26/2019  . Dyspnea   . Former tobacco use   . GERD (gastroesophageal reflux disease)    uses otc for occas. heartburn   . Hyperlipidemia   . Hypertension   . Mild CAD    a. nonobstructive CAD (50% PLOM 08/2017).  . Neuropathy   . Non-ischemic cardiomyopathy ([place])   . PAD (peripheral artery disease) ([place])    a.  s/p L-R fem-fem BPG 2016.  [name] Pneumonia    hosp. in [place]-2015  . Subclavian steal syndrome    a. h/o left subclavian steal.    Social History Social History   Tobacco Use  . Smoking status: Former Smoker    Types: Cigarettes    Quit date: 11/14/2010    Years since quitting: 8.5  . Smokeless tobacco: Never Used  Substance Use Topics  . Alcohol use: No    Alcohol/week: 0.0 standard drinks  . Drug use: No    Family History Family History  Problem Relation Age of Onset  . Heart disease Mother   . Hypertension Mother     Surgical History Past Surgical History:  Procedure Laterality Date  . ABDOMINAL AORTAGRAM N/A 11/27/2014   Procedure: ABDOMINAL Maxcine Ham;  Surgeon: Elam Dutch, MD;  Location: [facility] CATH LAB;  Service: Cardiovascular;  Laterality: N/A;  . ABDOMINAL HYSTERECTOMY    . APPENDECTOMY    . CARDIAC  CATHETERIZATION     02/20/13: Normal coronaries, moderate LV dysfunction, EF 35%. Medical RX ([facility])  . CARDIAC DEFIBRILLATOR PLACEMENT  Aug. 2014   dual chamber Biotronik ICD 05/06/13 ([facility], Dr. Minna Merritts)  . ENDARTERECTOMY FEMORAL Right 12/21/2014   Procedure: ENDARTERECTOMY FEMORAL;  Surgeon: Elam Dutch, MD;  Location: [place];  Service: Vascular;  Laterality: Right;  . FEMORAL-FEMORAL BYPASS GRAFT Bilateral 12/21/2014   Procedure: BYPASS GRAFT LEFT FEMORAL-RIGHT FEMORAL ARTERY;  Surgeon: Elam Dutch, MD;  Location: [place];  Service: Vascular;  Laterality: Bilateral;  . JOINT REPLACEMENT Bilateral    implants  . RIGHT/LEFT HEART CATH AND CORONARY ANGIOGRAPHY N/A 08/28/2017   Procedure: RIGHT/LEFT HEART CATH AND CORONARY ANGIOGRAPHY;  Surgeon: Larey Dresser, MD;  Location: [facility] INVASIVE CV  LAB;  Service: Cardiovascular;  Laterality: N/A;  . TUBAL LIGATION      Allergies  Allergen Reactions  . Oxycodone Shortness Of Breath  . Cymbalta [Duloxetine Hcl]     Loopy feeling  . Entresto [Sacubitril-Valsartan] Other (See Comments)    Impaired coordination---causes patient to drop many things, blood pressure bottomed out    Current Outpatient Medications  Medication Sig Dispense Refill  . albuterol (PROVENTIL HFA;VENTOLIN HFA) 108 (90 Base) MCG/ACT inhaler Inhale 2 puffs into the lungs every 6 (six) hours as needed for wheezing or shortness of breath. 1 Inhaler 6  . albuterol (PROVENTIL) (2.5 MG/3ML) 0.083% nebulizer solution Take 3 mLs by nebulization every 4 (four) hours as needed for wheezing.    [name] allopurinol (ZYLOPRIM) 100 MG tablet Take 100 mg by mouth daily.    [name] aspirin EC 81 MG tablet Take 162 mg by mouth daily.    [name] atorvastatin (LIPITOR) 20 MG tablet TAKE 1 TABLET BY MOUTH EVERY DAY 30 tablet 5  . carvedilol (COREG) 12.5 MG tablet TAKE 1 TABLET (12.5 MG TOTAL) BY MOUTH 2 (TWO) TIMES DAILY WITH A MEAL. 180 tablet 1  . Cyanocobalamin (VITAMIN B 12 PO) Take by mouth. Gummy    .  digoxin (LANOXIN) 0.125 MG tablet TAKE 1/2 TABLET BY MOUTH EVERY DAY 45 tablet 0  . fluticasone furoate-vilanterol (BREO ELLIPTA) 200-25 MCG/INH AEPB Inhale 1 puff into the lungs daily.    [name] glimepiride (AMARYL) 4 MG tablet Take 4 mg by mouth daily with breakfast.    . levothyroxine (SYNTHROID, LEVOTHROID) 25 MCG tablet Take 25 mcg by mouth daily before breakfast.    . losartan (COZAAR) 25 MG tablet Take 0.5 tablets (12.5 mg total) by mouth daily. 45 tablet 1  . metFORMIN (GLUCOPHAGE-XR) 500 MG 24 hr tablet Take 500 mg by mouth 2 (two) times daily.    Lebron Quam 5-325 MG tablet Take 5-325 mg by mouth as needed.    . nortriptyline (PAMELOR) 10 MG capsule Take 1 capsule (10 mg total) by mouth at bedtime. 30 capsule 1  . pantoprazole (PROTONIX) 40 MG tablet Take 1 tablet (40 mg total) by mouth at bedtime. 30 tablet 11  . pregabalin (LYRICA) 100 MG capsule Take 100 mg by mouth at bedtime.    [name] spironolactone (ALDACTONE) 25 MG tablet Take 1 tablet (25 mg total) by mouth daily. 90 tablet 3  . Tafamidis Meglumine, Cardiac, (VYNDAQEL) 20 MG CAPS Take 4 daily    . torsemide (DEMADEX) 20 MG tablet Take 2 tablets (40 mg total) by mouth daily. 180 tablet 1   No current facility-administered medications for this visit.      REVIEW OF SYSTEMS: See HPI for pertinent positives and negatives.  Physical Examination Vitals:   05/28/19 0910  BP: 109/68  Pulse: 62  Resp: 14  Temp: 97.9 F (36.6 C)  TempSrc: Temporal  SpO2: 98%  Weight: 177 lb 8 oz (80.5 kg)  Height: 5\' 5"  (1.651 m)   Body mass index is 29.54 kg/m.  General:  WDWN female in NAD Gait: Normal HENT: WNL Eyes: Pupils equal Pulmonary: normal non-labored breathing, good air movement in all fields, CTAB, no rales, rhonchi, or wheezes Cardiac: RRR, no murmur detected. AICD palpated left upper chest Abdomen: soft, NT, no masses palpated, panus Skin: no rashes, no ulcers, no cellulitis.   VASCULAR EXAM  Carotid Bruits Right Left    Positive Positive      Radial pulses: 2+ palpable right, faintly palpable left  Adominal aortic pulse is not palpable     Fem-fem bypass graft pulse is 1+ palpable                  VASCULAR EXAM: Extremities without ischemic changes, without Gangrene; without open wounds.                                                                                                          LE Pulses Right Left       FEMORAL  1+ palpable  1+ palpable        POPLITEAL  not palpable   not palpable       POSTERIOR TIBIAL  not palpable   not palpable        DORSALIS PEDIS      ANTERIOR TIBIAL not palpable  not palpable     Musculoskeletal: no muscle wasting or atrophy; no peripheral edema. Bilateral bunions.  Neurologic:  A&O X 3; appropriate affect, sensation is normal; speech is normal, CN 2-12 intact, pain and light touch intact in extremities, motor exam as listed above. Involuntary movements of toes of both feet when supine.  Psychiatric: Normal thought content, mood appropriate to clinical situation.    DATA  Carotid Duplex (05-27-19): Right Carotid: Velocities in the right ICA are consistent with a 40-59%                stenosis. Non-hemodynamically significant plaque <50% noted in                the CCA. Left Carotid: Velocities in the left ICA are consistent with a 60-79% stenosis.               Non-hemodynamically significant plaque <50% noted in the CCA. Vertebrals:  Right vertebral artery demonstrates antegrade flow. Left vertebral              artery demonstrates retrograde flow. Subclavians: Normal flow hemodynamics were seen in the right subclavian artery.              Monophasic waveform seen in the left subclavian artery. No change compared to the exam on 07-05-18.   Left to Right Fem-fem Bypass Graft (05-28-19): Left Graft #1: +--------------------+--------+--------+----------+--------+ L-R Fem-fem BPG     PSV cm/sStenosisWaveform   Comments +--------------------+--------+--------+----------+--------+ Inflow              169             biphasic           +--------------------+--------+--------+----------+--------+ Proximal Anastomosis173             biphasic           +--------------------+--------+--------+----------+--------+ Proximal Graft      161             monophasicbrisk    +--------------------+--------+--------+----------+--------+ Mid Graft           88              monophasic         +--------------------+--------+--------+----------+--------+ Distal Graft  58              monophasic         +--------------------+--------+--------+----------+--------+ Distal Anastomosis  149             monophasicbrisk    +--------------------+--------+--------+----------+--------+ Outflow             150             monophasic         +--------------------+--------+--------+----------+--------+ Summary: Right: Right proximal superficial femoral artery 115 cm/sec, monophasic. Left: Patent left to right femoral-femoral bypass graft with no evidence of stenosis.    ABI (Date: 05/28/2019): +---------+------------------+-----+---------+--------+ Right    Rt Pressure (mmHg)IndexWaveform Comment  +---------+------------------+-----+---------+--------+ Brachial 137                                      +---------+------------------+-----+---------+--------+ ATA      91                0.66 triphasic         +---------+------------------+-----+---------+--------+ PTA      101               0.74 triphasic         +---------+------------------+-----+---------+--------+ Great Toe118               0.86                   +---------+------------------+-----+---------+--------+  +---------+------------------+-----+---------+-------------------------+ Left     Lt Pressure (mmHg)IndexWaveform Comment                    +---------+------------------+-----+---------+-------------------------+ Brachial 87                              known subclavian stenosis +---------+------------------+-----+---------+-------------------------+ ATA      93                0.68 triphasic                          +---------+------------------+-----+---------+-------------------------+ PTA      94                0.69 triphasic                          +---------+------------------+-----+---------+-------------------------+ Great Toe82                0.60                                    +---------+------------------+-----+---------+-------------------------+  +-------+-----------+-----------+------------+------------+ ABI/TBIToday's ABIToday's TBIPrevious ABIPrevious TBI +-------+-----------+-----------+------------+------------+ Right  0.74       0.86       0.86        0.69         +-------+-----------+-----------+------------+------------+ Left   0.69       0.6        0.85        0.54         +-------+-----------+-----------+------------+------------+ Previous ABI on 07/05/18.   Summary: Right: Resting right ankle-brachial index indicates moderate right lower extremity arterial disease. The right toe-brachial index is normal. RT great toe pressure = 118 mmHg.  Left: Resting left ankle-brachial index indicates moderate  left lower extremity arterial disease. The left toe-brachial index is abnormal. LT Great toe pressure = 82 mmHg.   CTA head 07-27-18 requested by Dr. Cyril Mourning Ward, indications: confusion, dizziness  IMPRESSION: 1. No acute intracranial hemorrhage. 2. Mild chronic microvascular ischemic changes. If symptoms persist, and there are no contraindications, MRI may provide better evaluation if clinically indicated.   ASSESSMENT:  KAIDY WHIPPLE is a 73 y.o. female who iss/pleft to right femoral-femoral bypass, right femoral endarterectomy on 12/21/14. She  also has known extracranial carotid artery stenosis.She has no hx of stroke or TIA.   PAOD: Fem-fem bypass graft pulse is palpable since she lost weight Bilateral ABI have declined signifcantly, moderate disease bilaterally.  Bilateral femoral pulses are 1+ palpable. There are no signs of ischemia in her feet or legs.  She does not seem to walk enough to elicit claudication sx's as her walking is limited by dyspnea, painful neuropathy, and arthritis pain. Since her gym closed due to the pandemic she is no longer able to exercise daily on a stationary bike and other exercises. Daily seated leg exercises demonstrated and discussed.   Bilateral extracranial carotid artery stenosis with no hx of stroke or TIA: carotid duplex shows 40-59% right ICA stenosis and 60-79% left ICA stenosis. No change compared to the exam on 07-05-18.   Right radial pulse is 2+ palpable, left radial pulse is faintly palpable, left brachial pulse is faintly palpable.  Left subclavian stenosis, has become mildly symptomatic.    PLAN:   Based on today's exam and non-invasive vascular lab results, the patient will follow up in 6 months with the following tests: carotid duplex and ABI's. I advised her to notify us if she develops concerns about the circulation in her feet or legs.  Daily seated leg exercises discussed and demonstrated.   I discussed in depth with the patient the nature of atherosclerosis, and emphasized the importance of maximal medical management including strict control of blood pressure, blood glucose, and lipid levels, obtaining regular exercise, and cessation of smoking.  The patient is aware that without maximal medical management the underlying atherosclerotic disease process will progress, limiting the benefit of any interventions.  The patient was given information about stroke prevention and what symptoms should prompt the patient to seek immediate medical care.  The patient was given  information about PAD including signs, symptoms, treatment, what symptoms should prompt the patient to seek immediate medical care, and risk reduction measures to take.  Thank you for allowing [place] to participate in this patient's care.  Clemon Chambers, RN, MSN, FNP-C [facility] Office: [phone]  Clinic MD: Laqueta Due 05/28/2019 9:26 AM

## 2019-05-29 ENCOUNTER — Other Ambulatory Visit (HOSPITAL_COMMUNITY): Payer: Self-pay | Admitting: *Deleted

## 2019-05-29 MED ORDER — DIGOXIN 125 MCG PO TABS: 62.5000 ug | ORAL_TABLET | Freq: Every day | ORAL | 0 refills | Status: DC

## 2019-06-03 ENCOUNTER — Telehealth: Payer: Self-pay

## 2019-06-03 NOTE — Telephone Encounter (Signed)
Patient mentioned that she has been jumping in her sleep ever since she has been the Nortriptyline. She has since stopped taking it. She would like to know can anything else be prescribed.

## 2019-06-04 MED ORDER — CARBAMAZEPINE 100 MG PO CHEW: 100.0000 mg | CHEWABLE_TABLET | Freq: Two times a day (BID) | ORAL | 2 refills | Status: DC

## 2019-06-04 NOTE — Telephone Encounter (Addendum)
I reached out to the pt. She states after one dosage of nortriptyline she would wake up jumping in the middle of the night. Pt wanted to know if MD could recommended a different med? Pt states she cannot take Cymbalta,reports she had a reaction in the past.

## 2019-06-04 NOTE — Addendum Note (Signed)
Addended by: Kathrynn Ducking on: 06/04/2019 12:59 PM   Modules accepted: Orders

## 2019-06-04 NOTE — Telephone Encounter (Signed)
I called the patient.  The patient could not tolerate nortriptyline, we will try low-dose carbamazepine taken under milligrams twice daily, she will call for any dose adjustments.  She does have bilateral carpal tunnel syndrome, she is to get wrist splints for this and wear them as much as possible over the next couple months, if no better I will refer her to a hand surgeon.

## 2019-06-04 NOTE — Telephone Encounter (Signed)
I called pharmacy, the carbamazepine level may be increased by the concurrent use of atorvastatin, we will follow blood levels.  Okay to use carbamazepine.

## 2019-06-04 NOTE — Telephone Encounter (Signed)
Mia with [facility] called in regards to a interaction between Tegretol and Atorvastatin. Mia needs to confirm if MD is still in agreement with this therapy?  [facility] CB # is [id number].

## 2019-06-12 ENCOUNTER — Ambulatory Visit (HOSPITAL_COMMUNITY)
Admission: RE | Admit: 2019-06-12 | Discharge: 2019-06-12 | Disposition: A | Discharge status: Home / Self Care | Payer: Medicare Other | Source: Ambulatory Visit | Attending: Cardiology | Admitting: Cardiology

## 2019-06-12 ENCOUNTER — Encounter (HOSPITAL_COMMUNITY): Payer: Self-pay | Admitting: Cardiology

## 2019-06-12 ENCOUNTER — Other Ambulatory Visit: Payer: Self-pay

## 2019-06-12 ENCOUNTER — Telehealth (HOSPITAL_COMMUNITY): Payer: Self-pay | Admitting: *Deleted

## 2019-06-12 VITALS — BP 112/67 | HR 64 | Wt 174.6 lb

## 2019-06-12 DIAGNOSIS — Z79890 Hormone replacement therapy (postmenopausal): Secondary | ICD-10-CM | POA: Diagnosis not present

## 2019-06-12 DIAGNOSIS — Z885 Allergy status to narcotic agent status: Secondary | ICD-10-CM | POA: Insufficient documentation

## 2019-06-12 DIAGNOSIS — I779 Disorder of arteries and arterioles, unspecified: Secondary | ICD-10-CM

## 2019-06-12 DIAGNOSIS — J449 Chronic obstructive pulmonary disease, unspecified: Secondary | ICD-10-CM | POA: Diagnosis not present

## 2019-06-12 DIAGNOSIS — I6523 Occlusion and stenosis of bilateral carotid arteries: Secondary | ICD-10-CM | POA: Insufficient documentation

## 2019-06-12 DIAGNOSIS — Z96653 Presence of artificial knee joint, bilateral: Secondary | ICD-10-CM | POA: Diagnosis not present

## 2019-06-12 DIAGNOSIS — I2510 Atherosclerotic heart disease of native coronary artery without angina pectoris: Secondary | ICD-10-CM | POA: Insufficient documentation

## 2019-06-12 DIAGNOSIS — Z8249 Family history of ischemic heart disease and other diseases of the circulatory system: Secondary | ICD-10-CM | POA: Insufficient documentation

## 2019-06-12 DIAGNOSIS — Z87891 Personal history of nicotine dependence: Secondary | ICD-10-CM | POA: Insufficient documentation

## 2019-06-12 DIAGNOSIS — K219 Gastro-esophageal reflux disease without esophagitis: Secondary | ICD-10-CM | POA: Diagnosis not present

## 2019-06-12 DIAGNOSIS — E1142 Type 2 diabetes mellitus with diabetic polyneuropathy: Secondary | ICD-10-CM | POA: Insufficient documentation

## 2019-06-12 DIAGNOSIS — Z79899 Other long term (current) drug therapy: Secondary | ICD-10-CM | POA: Insufficient documentation

## 2019-06-12 DIAGNOSIS — I5022 Chronic systolic (congestive) heart failure: Secondary | ICD-10-CM | POA: Diagnosis present

## 2019-06-12 DIAGNOSIS — I428 Other cardiomyopathies: Secondary | ICD-10-CM | POA: Diagnosis not present

## 2019-06-12 DIAGNOSIS — Z7982 Long term (current) use of aspirin: Secondary | ICD-10-CM | POA: Insufficient documentation

## 2019-06-12 DIAGNOSIS — Z7951 Long term (current) use of inhaled steroids: Secondary | ICD-10-CM | POA: Diagnosis not present

## 2019-06-12 DIAGNOSIS — E785 Hyperlipidemia, unspecified: Secondary | ICD-10-CM | POA: Insufficient documentation

## 2019-06-12 DIAGNOSIS — Z7984 Long term (current) use of oral hypoglycemic drugs: Secondary | ICD-10-CM | POA: Insufficient documentation

## 2019-06-12 DIAGNOSIS — M109 Gout, unspecified: Secondary | ICD-10-CM | POA: Insufficient documentation

## 2019-06-12 DIAGNOSIS — Z888 Allergy status to other drugs, medicaments and biological substances status: Secondary | ICD-10-CM | POA: Insufficient documentation

## 2019-06-12 DIAGNOSIS — I43 Cardiomyopathy in diseases classified elsewhere: Secondary | ICD-10-CM | POA: Diagnosis not present

## 2019-06-12 DIAGNOSIS — E854 Organ-limited amyloidosis: Secondary | ICD-10-CM

## 2019-06-12 LAB — BASIC METABOLIC PANEL
Anion gap: 12 (ref 5–15)
BUN: 30 mg/dL — ABNORMAL HIGH (ref 8–23)
CO2: 27 mmol/L (ref 22–32)
Calcium: 9.7 mg/dL (ref 8.9–10.3)
Chloride: 102 mmol/L (ref 98–111)
Creatinine, Ser: 1.17 mg/dL — ABNORMAL HIGH (ref 0.44–1.00)
GFR calc Af Amer: 54 mL/min — ABNORMAL LOW (ref >60)
GFR calc non Af Amer: 47 mL/min — ABNORMAL LOW (ref >60)
Glucose, Bld: 148 mg/dL — ABNORMAL HIGH (ref 70–99)
Potassium: 5.2 mmol/L — ABNORMAL HIGH (ref 3.5–5.1)
Sodium: 141 mmol/L (ref 135–145)

## 2019-06-12 LAB — LIPID PANEL
Cholesterol: 103 mg/dL (ref 0–200)
HDL: 26 mg/dL — ABNORMAL LOW (ref >40)
LDL Cholesterol: 50 mg/dL (ref 0–99)
Total CHOL/HDL Ratio: 4 RATIO
Triglycerides: 134 mg/dL (ref <150)
VLDL: 27 mg/dL (ref 0–40)

## 2019-06-12 LAB — TSH: TSH: 1.877 u[IU]/mL (ref 0.350–4.500)

## 2019-06-12 LAB — DIGOXIN LEVEL: Digoxin Level: 0.5 ng/mL — ABNORMAL LOW (ref 0.8–2.0)

## 2019-06-12 NOTE — Telephone Encounter (Signed)
Pt left vm inquiring about tsh level and "diabetes level". No answer left detailed message that her TSH was 1.877 and that we did not check her a1c but we did check her glucose ("blood sugar") and I was 148. Asked patient to return my call if she had anymore questions.

## 2019-06-12 NOTE — Progress Notes (Signed)
Date:  06/12/2019   ID:  Lisa [name], DOB 02-24-1946, MRN [id number]  Provider location: [facility] Type of Visit: Established patient  PCP:  Kelton Pillar, MD  Cardiologist:  Loralie Champagne, MD   History of Present Illness: Lisa [name] is a 73 y.o. female with a history of PAD, COPD, carotid stenosis, and chronic systolic CHF.    Patient has a long history of vascular disease.  She had left-right fem-fem bypass in 3/16. She has moderate bilateral carotid stenosis.  Echo in 2/18 showed EF 20-25%.  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  She was admitted in 9/18 to the hospital in [facility] for about 4 days with acute on chronic systolic CHF.    RHC/LHC in 12/18 showed nonobstructive coronary disease, low filling pressures, and CI 2.0.   Admitted 09/20/17 with volume overload. Diuresed with IV lasix. Weight went down 2 pounds. Discharged on torsemide 40 mg daily. Discharge weight 179 pounds.   Echo in 12/18 with EF 20-25%, mild basal ventricular hypertrophy, severe mid to apical LV hypertrophy.  Repeat echo in 2/19 was done with Definity contrast, showing that what was thought to be apical hypertrophy was actually prominent mid-apical trabeculation suggestive of noncompaction.   She was noted to have Bence-Jones protein on UPEP.  However, SPEP was negative and 24 hours UPEP was also negative. Abdominal fat pad biopsy did not show evidence of amyloidosis.  She saw heme-onc, probably does not have AL amyloidosis.  However, in 2/19, she had TcPYP scan that was strongly suggestive of transthyretin amyloidosis. She has peripheral neuropathy that may be due to amyloidosis as well (versus diabetes). 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   Admitted 6/24-6/26/19 for LLL PNA. She received IV abx and prednisone. BP medications were held, but restarted at discharge.   Echo in 11/19 showed EF 30-35%, mild  LVH, mild-moderate MR.   Cardiolite in 5/20 showed EF 30%, no ischemia or infarction.   She returns for followup of CHF.  Weight is down 9 lbs with changes in her diet.  She continues to have pain in her feet, especially the plantar surfaces.  She does not seem to have claudication-type pain.  Minimal exertional dyspnea, not using her inhaler.  No orthopnea/PND.  No chest pain.  Losartan and torsemide were recently decreased due to creatinine rise, and creatinine improved. She has started to exercise again.   Labs (8/18): LDL 80, HDL 46 Labs (10/18): K 4.2, creatinine 1.1 Labs (11/18): digoxin 0.3 Labs (12/18): K 3.5, creatinine 1.02, UPEP with faint Bence-Jones protein but 24 hour UPEP negative and SPEP negative.  Labs (1/19): LDL 79, HDL 38, BNP 493, hgb 13.6, K 5.8 => 4.8, creatinine 1.18, digoxin 0.3 Labs (2/19): K 3.3, creatinine 1.06 Labs (3/19): digoxin < 0.2 Labs (4/19): K 5, creatinine 0.97 Labs (6/19): K 3.5, creatinine 1.19 Labs (7/19): digoxin < 0.2, K 4.4, creatinine 1.14 Labs (2/20): K 4.2, creatinine 1.19 Labs (5/20): LDL 79, K 4.2, creatinine 1.47, digoxin level 0.5 Labs (6/20): K 4.8, creatinine 1.77 => 1.24, digoxin 0.5  ECG (personally reviewed): NSR, 1st degree AV block, LAFB, poor RWP  PMH: 1. PAD: L=>R fem-fem bypass in 3/16.   - ABIs (3/18): Mildly abnormal, 0.81 left and 0.85 right.  - ABIs (4/19): 0.68 left and 0.66 right - ABIs (9/20): 0.69 left and 0.74 right 2. H/o appendectomy 3. COPD: Quit smoking in 2/12.  - High resolution CT chest (2/19):  No interstitial lung disease, +emphysema.  4. Arthritis: Bilateral TKR 5. Diet-controlled diabetes 6. Hyperlipidemia 7. Carotid stenosis:  - 10/18 Carotid dopplers: 60-79% RICA stenosis, A999333 LICA stenosis, left subclavian steal.  - 4/19 Carotid dopplers: 123456 RICA, A999333 LICA, left subclavian stenosis.  - 9/20 Carotid dopplers: 123456 RICA, A999333 LICA 8. Chronic systolic CHF: Echo (0000000) with EF 20-25%,  mild LV dilation, normal RV size and systolic function. 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 .  - LHC (10/17): Nonobstructive CAD.  - Biotronik ICD.  - LHC/RHC (12/18): 50% proximal PLOM (no obstructive disease). Mean RA 1, PA 32/10 mean 19, mean PCWP 7, CI 2.0.  - Echo (12/18): EF 20-25%, mild basal LV hypertrophy, severe mid to apical LV hypertrophy, moderate diastolic dysfunction, mild MR, PASP 42 mmHg.  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  - Workup appears negative for AL amyloidosis.  Negative abdominal fat pad biopsy.  However, PYP scan was suggestive of transthyretin amyloidosis.  - Echo (11/19): EF 30-35%, mild LVH, mild-moderate MR, RV normal.  - Cardiolite (5/20): EF 30%, no ischemia/infarction.  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  10. Peripheral neuropathy: ?due to amyloidosis versus diabetes.  11. Gout 12. H/o Bell's palsy 13. GERD  Current Outpatient Medications  Medication Sig Dispense Refill   albuterol (PROVENTIL HFA;VENTOLIN HFA) 108 (90 Base) MCG/ACT inhaler Inhale 2 puffs into the lungs every 6 (six) hours as needed for wheezing or shortness of breath. 1 Inhaler 6   albuterol (PROVENTIL) (2.5 MG/3ML) 0.083% nebulizer solution Take 3 mLs by nebulization every 4 (four) hours as needed for wheezing.     allopurinol (ZYLOPRIM) 100 MG tablet Take 100 mg by mouth daily.     aspirin EC 81 MG tablet Take 162 mg by mouth daily.     atorvastatin (LIPITOR) 20 MG tablet TAKE 1 TABLET BY MOUTH EVERY DAY 30 tablet 5   carvedilol (COREG) 12.5 MG tablet TAKE 1 TABLET (12.5 MG TOTAL) BY MOUTH 2 (TWO) TIMES DAILY WITH A MEAL. 180 tablet 1   Cyanocobalamin (VITAMIN B 12 PO) Take by mouth.  Gummy     digoxin (LANOXIN) 0.125 MG tablet Take 0.5 tablets (62.5 mcg total) by mouth daily. 45 tablet 0   fluticasone furoate-vilanterol (BREO ELLIPTA) 200-25 MCG/INH AEPB Inhale 1 puff into the lungs daily.     glimepiride (AMARYL) 4 MG tablet Take 4 mg by mouth daily with breakfast.     levothyroxine (SYNTHROID, LEVOTHROID) 25 MCG tablet Take 25 mcg by mouth daily before breakfast.     losartan (COZAAR) 25 MG tablet Take 0.5 tablets (12.5 mg total) by mouth daily. 45 tablet 1   metFORMIN (GLUCOPHAGE-XR) 500 MG 24 hr tablet Take 500 mg by mouth 2 (two) times daily.     NORCO 5-325 MG tablet Take 5-325 mg by mouth as needed.     pregabalin (LYRICA) 100 MG capsule Take 100 mg by mouth at bedtime.     spironolactone (ALDACTONE) 25 MG tablet Take 1 tablet (25 mg total) by mouth daily. 90 tablet 3   Tafamidis Meglumine, Cardiac, (VYNDAQEL) 20 MG CAPS Take 4 daily     torsemide (DEMADEX) 20 MG tablet Take 2 tablets (40 mg total) by mouth daily. 99991111 tablet 1   TRULICITY A999333 0000000 SOPN Inject  every Friday     No current facility-administered medications for this encounter.     Allergies:   Oxycodone, Cymbalta [duloxetine hcl], Entresto [sacubitril-valsartan], and Nortriptyline   Social History:  The patient  reports that she quit smoking about 8 years ago. Her smoking use included cigarettes. She has never used smokeless tobacco. She reports that she does not drink alcohol or use drugs.   Family History:  The patient's family history includes Heart disease in her mother; Hypertension in her mother.   ROS:  Please see the history of present illness.   All other systems are personally reviewed and negative.   Exam:   BP 112/67    Pulse 64    Wt 79.2 kg (174 lb 9.6 oz)    SpO2 100%    BMI 29.05 kg/m  General: NAD Neck: No JVD, no thyromegaly or thyroid nodule.  Lungs: Clear to auscultation bilaterally with normal respiratory effort. CV: Nondisplaced PMI.  Heart regular S1/S2,  no S3/S4, no murmur.  No peripheral edema.  No carotid bruit.  Unable to palpate pedal pulses.  Abdomen: Soft, nontender, no hepatosplenomegaly, no distention.  Skin: Intact without lesions or rashes.  Neurologic: Alert and oriented x 3.  Psych: Normal affect. Extremities: No clubbing or cyanosis.  HEENT: Normal.   Recent Labs: 07/27/2018: ALT 33 10/08/2018: B Natriuretic Peptide 392.0; Hemoglobin 12.5; Platelets 302 03/06/2019: Magnesium 1.7 06/12/2019: BUN 30; Creatinine, Ser 1.17; Potassium 5.2; Sodium 141; TSH 1.877  Personally reviewed   Wt Readings from Last 3 Encounters:  06/12/19 79.2 kg (174 lb 9.6 oz)  05/28/19 80.5 kg (177 lb 8 oz)  04/07/19 82.1 kg (181 lb)      ASSESSMENT AND PLAN:  1. Chronic systolic CHF: Nonischemic cardiomyopathy.  EF 20-25% on echo in 2/18.  LHC/RHC in 12/18 with low filling pressures, CI 2.0, and nonobstructive CAD. She has a Biotronik ICD.  Also of note, daughter and nephew have sarcoidosis => patient had a chest CT in 2/19 without evidence for pulmonary sarcoidosis.  We are unable to do an MRI to look for infiltrative disease due to ICD.  Echo in 12/18 showed EF 20-25%, noted to have LVH pattern that is mild in the basal segments and moderate to severe in the apical segments. This was concerning for apical HCM and study was repeated in 2/19 with Definity contrast.  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 We also did a workup for amyloidosis. She did not have evidence for AL amyloidosis, but TcPYP scan in 2/19 was strongly suggestive of transthyretin amyloidosis.  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 Genetic testing was negative, suggesting wild-type TTR amyloidosis. Most recent echo in 11/19 showed EF 30-35%. NYHA class II symptoms with weight trending down.  She is not volume overloaded  on exam.  - Continue torsemide 40 mg daily. BMET today.  - She did not tolerate low dose Entresto. Losartan recently decreased with elevated creatinine.  Continue losartan 12.5 mg daily.    - Continue spironolactone 25 mg daily.  - Continue digoxin, check digoxin level.  - Continue Coreg 12.5 mg bid.   - Narrow QRS, not CRT candidate.   - I will repeat echo at followup in 3 months.  2. PAD: s/p fem-fem bypass, followed by [facility].  Last ABIs in 9/20 with moderate disease, stable from past. Pain in legs seems more consistent with neuropathy than  claudication.  3. Carotid stenosis: Moderate stenosis on dopplers. Follows with [facility]. No change 4. Hyperlipidemia: Continue atorvastatin, goal LDL < 70 given vascular disease. Check lipids today.   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 She has peripheral neuropathy.  Genetic testing negative, wild-type.  - Continue tafamidis.    Followup in 3 months with echo.   Signed, Loralie Champagne, MD  06/12/2019  [facility] [address] [facility] and [place] [address] [phone] (office) [phone] (fax)

## 2019-06-12 NOTE — Patient Instructions (Signed)
Lab work done today. We will notify you of any abnormal lab work. No news is good news!  Your physician has requested that you have an echocardiogram. Echocardiography is a painless test that uses sound waves to create images of your heart. It provides your doctor with information about the size and shape of your heart and how well your heart's chambers and valves are working. This procedure takes approximately one hour. There are no restrictions for this procedure. This will be done at your next appointment.  Please follow up with the [facility] in 3 months with an echocardiogram.  At the [facility], you and your health needs are our priority. As part of our continuing mission to provide you with exceptional heart care, we have created designated Provider Care Teams. These Care Teams include your primary Cardiologist (physician) and Advanced Practice Providers (APPs- Physician Assistants and Nurse Practitioners) who all work together to provide you with the care you need, when you need it.   You may see any of the following providers on your designated Care Team at your next follow up: [name] Dr Glori Bickers . Dr Loralie Champagne . Darrick Grinder, NP   Please be sure to bring in all your medications bottles to every appointment.

## 2019-06-23 ENCOUNTER — Encounter: Payer: Self-pay | Admitting: Cardiology

## 2019-06-23 ENCOUNTER — Other Ambulatory Visit: Payer: Self-pay

## 2019-06-23 ENCOUNTER — Ambulatory Visit (INDEPENDENT_AMBULATORY_CARE_PROVIDER_SITE_OTHER): Payer: Medicare Other | Admitting: Cardiology

## 2019-06-23 VITALS — BP 118/54 | HR 78 | Ht 65.0 in | Wt 174.2 lb

## 2019-06-23 DIAGNOSIS — Z79899 Other long term (current) drug therapy: Secondary | ICD-10-CM | POA: Diagnosis not present

## 2019-06-23 DIAGNOSIS — Z95810 Presence of automatic (implantable) cardiac defibrillator: Secondary | ICD-10-CM

## 2019-06-23 DIAGNOSIS — I5022 Chronic systolic (congestive) heart failure: Secondary | ICD-10-CM | POA: Diagnosis not present

## 2019-06-23 DIAGNOSIS — I428 Other cardiomyopathies: Secondary | ICD-10-CM | POA: Diagnosis not present

## 2019-06-23 NOTE — Patient Instructions (Addendum)
Medication Instructions:  Your physician recommends that you continue on your current medications as directed. Please refer to the Current Medication list given to you today.  *If you need a refill on your cardiac medications before your next appointment, please call your pharmacy*  Labwork: Today: BMET (for Dr. Aundra Dubin) If you have labs (blood work) drawn today and your tests are completely normal, you will receive your results only by:  [place] (if you have MyChart) OR  A paper copy in the mail If you have any lab test that is abnormal or we need to change your treatment, we will call you to review the results.  Testing/Procedures: None ordered  Follow-Up: Remote monitoring is used to monitor your Pacemaker or ICD from home. This monitoring reduces the number of office visits required to check your device to one time per year. It allows [place] to keep an eye on the functioning of your device to ensure it is working properly. You are scheduled for a device check from home on 08/13/19. You may send your transmission at any time that day. If you have a wireless device, the transmission will be sent automatically. After your physician reviews your transmission, you will receive a postcard with your next transmission date.  Your physician wants you to follow-up in: 1 year with Dr. Curt Bears.  You will receive a reminder letter in the mail two months in advance. If you don't receive a letter, please call our office to schedule the follow-up appointment.  Thank you for choosing [facility]!!   Trinidad Curet, RN [phone]  Any Other Special Instructions Will Be Listed Below (If Applicable).

## 2019-06-23 NOTE — Progress Notes (Signed)
Electrophysiology Office Note   Date:  06/23/2019   ID:  Lisa [name], DOB 1945-10-26, MRN [id number]  PCP:  Kelton Pillar, MD  Cardiologist:  Revankar Primary Electrophysiologist:  Will Meredith Leeds, MD    No chief complaint on file.    History of Present Illness: Lisa [name] is a 73 y.o. female who is being seen today for the evaluation of CHF, ICD at the request of [name], MD. Presenting today for electrophysiology evaluation. She has a history of congestive heart failure, hypertension, COPD, diabetes, hypertension, hyperlipidemia. She has a Biotronik ICD in place.  Today, denies symptoms of palpitations, chest pain, shortness of breath, orthopnea, PND, lower extremity edema, claudication, dizziness, presyncope, syncope, bleeding, or neurologic sequela. The patient is tolerating medications without difficulties.     Past Medical History:  Diagnosis Date  . AICD (automatic cardioverter/defibrillator) present 2014   Biotroniks  . Arthritis    knees with arthritis as well as has back, pt. remarks that her hands fall asleep when she is in the bed or in certain positions   . Bell's palsy   . Carotid artery disease ([place])    a. duplex - moderate bilateral carotid disease (60-79% 06/2017).  . Chronic systolic CHF (congestive heart failure) ([place])   . COPD (chronic obstructive pulmonary disease) ([place])   . Diabetes mellitus without complication (HCC)    borderline  . Diabetic neuropathy ([place]) 05/26/2019  . Dyspnea   . Former tobacco use   . GERD (gastroesophageal reflux disease)    uses otc for occas. heartburn   . Hyperlipidemia   . Hypertension   . Mild CAD    a. nonobstructive CAD (50% PLOM 08/2017).  . Neuropathy   . Non-ischemic cardiomyopathy ([place])   . PAD (peripheral artery disease) ([place])    a.  s/p L-R fem-fem BPG 2016.  [name] Pneumonia    hosp. in [place]-2015  . Subclavian steal syndrome    a. h/o left subclavian steal.   Past Surgical  History:  Procedure Laterality Date  . ABDOMINAL AORTAGRAM N/A 11/27/2014   Procedure: ABDOMINAL Maxcine Ham;  Surgeon: Elam Dutch, MD;  Location: [facility] CATH LAB;  Service: Cardiovascular;  Laterality: N/A;  . ABDOMINAL HYSTERECTOMY    . APPENDECTOMY    . CARDIAC CATHETERIZATION     02/20/13: Normal coronaries, moderate LV dysfunction, EF 35%. Medical RX ([facility])  . CARDIAC DEFIBRILLATOR PLACEMENT  Aug. 2014   dual chamber Biotronik ICD 05/06/13 ([facility], Dr. Minna Merritts)  . ENDARTERECTOMY FEMORAL Right 12/21/2014   Procedure: ENDARTERECTOMY FEMORAL;  Surgeon: Elam Dutch, MD;  Location: [facility];  Service: Vascular;  Laterality: Right;  . FEMORAL-FEMORAL BYPASS GRAFT Bilateral 12/21/2014   Procedure: BYPASS GRAFT LEFT FEMORAL-RIGHT FEMORAL ARTERY;  Surgeon: Elam Dutch, MD;  Location: [facility];  Service: Vascular;  Laterality: Bilateral;  . JOINT REPLACEMENT Bilateral    implants  . RIGHT/LEFT HEART CATH AND CORONARY ANGIOGRAPHY N/A 08/28/2017   Procedure: RIGHT/LEFT HEART CATH AND CORONARY ANGIOGRAPHY;  Surgeon: Larey Dresser, MD;  Location: [facility] CV LAB;  Service: Cardiovascular;  Laterality: N/A;  . TUBAL LIGATION       Current Outpatient Medications  Medication Sig Dispense Refill  . albuterol (PROVENTIL HFA;VENTOLIN HFA) 108 (90 Base) MCG/ACT inhaler Inhale 2 puffs into the lungs every 6 (six) hours as needed for wheezing or shortness of breath. 1 Inhaler 6  . albuterol (PROVENTIL) (2.5 MG/3ML) 0.083% nebulizer solution Take 3 mLs by nebulization every 4 (four) hours as  needed for wheezing.    [name] allopurinol (ZYLOPRIM) 100 MG tablet Take 100 mg by mouth daily.    [name] aspirin EC 81 MG tablet Take 162 mg by mouth daily.    [name] atorvastatin (LIPITOR) 20 MG tablet TAKE 1 TABLET BY MOUTH EVERY DAY 30 tablet 5  . carvedilol (COREG) 12.5 MG tablet TAKE 1 TABLET (12.5 MG TOTAL) BY MOUTH 2 (TWO) TIMES DAILY WITH A MEAL. 180 tablet 1  . Cyanocobalamin (VITAMIN B 12 PO) Take by mouth. Gummy    .  digoxin (LANOXIN) 0.125 MG tablet Take 0.5 tablets (62.5 mcg total) by mouth daily. 45 tablet 0  . fluticasone furoate-vilanterol (BREO ELLIPTA) 200-25 MCG/INH AEPB Inhale 1 puff into the lungs daily.    [name] glimepiride (AMARYL) 4 MG tablet Take 4 mg by mouth daily with breakfast.    . levothyroxine (SYNTHROID, LEVOTHROID) 25 MCG tablet Take 25 mcg by mouth daily before breakfast.    . losartan (COZAAR) 25 MG tablet Take 0.5 tablets (12.5 mg total) by mouth daily. 45 tablet 1  . metFORMIN (GLUCOPHAGE-XR) 500 MG 24 hr tablet Take 500 mg by mouth 2 (two) times daily.    Lebron Quam 5-325 MG tablet Take 5-325 mg by mouth as needed.    . pregabalin (LYRICA) 100 MG capsule Take 100 mg by mouth at bedtime.    [name] spironolactone (ALDACTONE) 25 MG tablet Take 1 tablet (25 mg total) by mouth daily. 90 tablet 3  . Tafamidis Meglumine, Cardiac, (VYNDAQEL) 20 MG CAPS Take 4 daily    . torsemide (DEMADEX) 20 MG tablet Take 2 tablets (40 mg total) by mouth daily. 180 tablet 1  . TRULICITY A999333 0000000 SOPN Inject every Friday     No current facility-administered medications for this visit.     Allergies:   Oxycodone, Cymbalta [duloxetine hcl], Entresto [sacubitril-valsartan], and Nortriptyline   Social History:  The patient  reports that she quit smoking about 8 years ago. Her smoking use included cigarettes. She has never used smokeless tobacco. She reports that she does not drink alcohol or use drugs.   Family History:  The patient's family history includes Heart disease in her mother; Hypertension in her mother.    ROS:  Please see the history of present illness.   Otherwise, review of systems is positive for none.   All other systems are reviewed and negative.   PHYSICAL EXAM: VS:  BP (!) 118/54   Pulse 78   Ht 5\' 5"  (1.651 m)   Wt 174 lb 3.2 oz (79 kg)   SpO2 94%   BMI 28.99 kg/m  , BMI Body mass index is 28.99 kg/m. GEN: Well nourished, well developed, in no acute distress  HEENT: normal  Neck:  no JVD, carotid bruits, or masses Cardiac: RRR; no murmurs, rubs, or gallops,no edema  Respiratory:  clear to auscultation bilaterally, normal work of breathing GI: soft, nontender, nondistended, + BS MS: no deformity or atrophy  Skin: warm and dry, device site well healed Neuro:  Strength and sensation are intact Psych: euthymic mood, full affect  EKG:  EKG is not ordered today. Personal review of the ekg ordered 03/06/2019 shows sinus rhythm, first-degree AV block, left anterior fascicular block  Personal review of the device interrogation today. Results in [place]: 07/27/2018: ALT 33 10/08/2018: B Natriuretic Peptide 392.0; Hemoglobin 12.5; Platelets 302 03/06/2019: Magnesium 1.7 06/12/2019: BUN 30; Creatinine, Ser 1.17; Potassium 5.2; Sodium 141; TSH 1.877    Lipid  Panel     Component Value Date/Time   CHOL 103 06/12/2019 1102   TRIG 134 06/12/2019 1102   HDL 26 (L) 06/12/2019 1102   CHOLHDL 4.0 06/12/2019 1102   VLDL 27 06/12/2019 1102   LDLCALC 50 06/12/2019 1102     Wt Readings from Last 3 Encounters:  06/23/19 174 lb 3.2 oz (79 kg)  06/12/19 174 lb 9.6 oz (79.2 kg)  05/28/19 177 lb 8 oz (80.5 kg)      Other studies Reviewed: Additional studies/ records that were reviewed today include: TTE 04/02/17  Review of the above records today demonstrates:  Severely reduced LV systolic function. Ejection fraction is visually estimated at 25% Global LV hypokinesis. Abnormal LV diastolic function with evidence of elevated LA pressure. Mildly dilated RV chamber. RV systolic function is probably normal. Biatrial dilation, mild. Moderate mitral regurgitation, probably functional. Mild tricuspid regurgitation.   ASSESSMENT AND PLAN:  1.  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  Device functioning appropriately.  Has had increases in in the RV lead impedance.  Still  functioning appropriately.  No changes.  That way I will go to have  2. Hypertension: Currently well controlled.  No changes.  Current medicines are reviewed at length with the patient today.   The patient does not have concerns regarding her medicines.  The following changes were made today: none  Labs/ tests ordered today include:  No orders of the defined types were placed in this encounter.    Disposition:   FU with Will Camnitz 12 months  Signed, Will Meredith Leeds, MD  06/23/2019 10:35 AM     [facility] [address] [place] [address] [phone] (office) [phone] (fax)

## 2019-06-24 ENCOUNTER — Other Ambulatory Visit (HOSPITAL_COMMUNITY): Payer: Medicare Other

## 2019-06-24 LAB — BASIC METABOLIC PANEL
BUN/Creatinine Ratio: 31 — ABNORMAL HIGH (ref 12–28)
BUN: 41 mg/dL — ABNORMAL HIGH (ref 8–27)
CO2: 23 mmol/L (ref 20–29)
Calcium: 9.9 mg/dL (ref 8.7–10.3)
Chloride: 102 mmol/L (ref 96–106)
Creatinine, Ser: 1.33 mg/dL — ABNORMAL HIGH (ref 0.57–1.00)
GFR calc Af Amer: 46 mL/min/{1.73_m2} — ABNORMAL LOW (ref >59)
GFR calc non Af Amer: 40 mL/min/{1.73_m2} — ABNORMAL LOW (ref >59)
Glucose: 154 mg/dL — ABNORMAL HIGH (ref 65–99)
Potassium: 4.9 mmol/L (ref 3.5–5.2)
Sodium: 141 mmol/L (ref 134–144)

## 2019-06-29 ENCOUNTER — Other Ambulatory Visit (HOSPITAL_COMMUNITY): Payer: Self-pay | Admitting: Cardiology

## 2019-07-07 ENCOUNTER — Other Ambulatory Visit (HOSPITAL_COMMUNITY): Payer: Self-pay | Admitting: *Deleted

## 2019-07-07 MED ORDER — VYNDAQEL 20 MG PO CAPS: 80.0000 mg | ORAL_CAPSULE | Freq: Every day | ORAL | 11 refills | Status: DC

## 2019-07-15 ENCOUNTER — Other Ambulatory Visit (HOSPITAL_COMMUNITY): Payer: Self-pay

## 2019-07-15 MED ORDER — VYNDAQEL 20 MG PO CAPS: 80.0000 mg | ORAL_CAPSULE | Freq: Every day | ORAL | 11 refills | Status: DC

## 2019-08-01 LAB — CUP PACEART INCLINIC DEVICE CHECK
Battery Voltage: 3.1 V
Brady Statistic RA Percent Paced: 43 %
Brady Statistic RV Percent Paced: 5 %
Date Time Interrogation Session: 20200928141600
HighPow Impedance: 80 Ohm
Implantable Lead Implant Date: 20140812
Implantable Lead Implant Date: 20140812
Implantable Lead Location: 753859
Implantable Lead Location: 753860
Implantable Lead Model: 350
Implantable Lead Model: 375
Implantable Lead Serial Number: 10491693
Implantable Lead Serial Number: 29451060
Implantable Pulse Generator Implant Date: 20140812
Lead Channel Impedance Value: 1677 Ohm
Lead Channel Impedance Value: 534 Ohm
Lead Channel Pacing Threshold Amplitude: 0.3 V
Lead Channel Pacing Threshold Amplitude: 0.4 V
Lead Channel Pacing Threshold Pulse Width: 0.4 ms
Lead Channel Pacing Threshold Pulse Width: 0.4 ms
Lead Channel Sensing Intrinsic Amplitude: 19.6 mV
Lead Channel Sensing Intrinsic Amplitude: 4.2 mV
Lead Channel Setting Pacing Amplitude: 2 V
Lead Channel Setting Pacing Amplitude: 2.5 V
Lead Channel Setting Pacing Pulse Width: 0.4 ms
Lead Channel Setting Sensing Sensitivity: 0.8 mV
Pulse Gen Model: 383562
Pulse Gen Serial Number: 60721404

## 2019-08-07 ENCOUNTER — Ambulatory Visit: Payer: Medicare Other | Admitting: Neurology

## 2019-08-13 ENCOUNTER — Ambulatory Visit (INDEPENDENT_AMBULATORY_CARE_PROVIDER_SITE_OTHER): Payer: Medicare Other | Admitting: *Deleted

## 2019-08-13 DIAGNOSIS — I5043 Acute on chronic combined systolic (congestive) and diastolic (congestive) heart failure: Secondary | ICD-10-CM | POA: Diagnosis not present

## 2019-08-13 DIAGNOSIS — R000 Tachycardia, unspecified: Secondary | ICD-10-CM

## 2019-08-14 LAB — CUP PACEART REMOTE DEVICE CHECK
Date Time Interrogation Session: 20201118122637
Implantable Lead Implant Date: 20140812
Implantable Lead Implant Date: 20140812
Implantable Lead Location: 753859
Implantable Lead Location: 753860
Implantable Lead Model: 350
Implantable Lead Model: 375
Implantable Lead Serial Number: 10491693
Implantable Lead Serial Number: 29451060
Implantable Pulse Generator Implant Date: 20140812
Pulse Gen Model: 383562
Pulse Gen Serial Number: 60721404

## 2019-08-29 ENCOUNTER — Other Ambulatory Visit (HOSPITAL_COMMUNITY): Payer: Self-pay | Admitting: Cardiology

## 2019-09-08 ENCOUNTER — Telehealth (HOSPITAL_COMMUNITY): Payer: Self-pay | Admitting: Pharmacy Technician

## 2019-09-08 NOTE — Telephone Encounter (Signed)
Advanced Heart Failure Patient Advocate Encounter  Prior Authorization for Lisa [name] has been approved.    PA# [id number] Effective dates: 09/08/2019 through 09/24/2020  Patients co-pay is $0.00  [name], CPhT

## 2019-09-08 NOTE — Telephone Encounter (Signed)
Patient Advocate Encounter   Received notification from [employer] that prior authorization for Vyndamax is required.   PA submitted on CoverMyMeds [facility] Status is pending   Will continue to follow.  [name], CPhT

## 2019-09-09 ENCOUNTER — Other Ambulatory Visit (HOSPITAL_COMMUNITY): Payer: Self-pay | Admitting: Cardiology

## 2019-09-11 ENCOUNTER — Ambulatory Visit (HOSPITAL_COMMUNITY)
Admission: RE | Admit: 2019-09-11 | Discharge: 2019-09-11 | Disposition: A | Discharge status: Home / Self Care | Payer: Medicare Other | Source: Ambulatory Visit | Attending: Family Medicine | Admitting: Family Medicine

## 2019-09-11 ENCOUNTER — Ambulatory Visit (HOSPITAL_BASED_OUTPATIENT_CLINIC_OR_DEPARTMENT_OTHER)
Admission: RE | Admit: 2019-09-11 | Discharge: 2019-09-11 | Disposition: A | Discharge status: Home / Self Care | Payer: Medicare Other | Source: Ambulatory Visit | Attending: Cardiology | Admitting: Cardiology

## 2019-09-11 ENCOUNTER — Other Ambulatory Visit: Payer: Self-pay

## 2019-09-11 ENCOUNTER — Other Ambulatory Visit (HOSPITAL_COMMUNITY): Payer: Self-pay | Admitting: Cardiology

## 2019-09-11 ENCOUNTER — Encounter (HOSPITAL_COMMUNITY): Payer: Self-pay | Admitting: Cardiology

## 2019-09-11 VITALS — BP 114/52 | HR 60 | Wt 174.6 lb

## 2019-09-11 DIAGNOSIS — Z888 Allergy status to other drugs, medicaments and biological substances status: Secondary | ICD-10-CM | POA: Diagnosis not present

## 2019-09-11 DIAGNOSIS — K219 Gastro-esophageal reflux disease without esophagitis: Secondary | ICD-10-CM | POA: Insufficient documentation

## 2019-09-11 DIAGNOSIS — Z79899 Other long term (current) drug therapy: Secondary | ICD-10-CM | POA: Diagnosis not present

## 2019-09-11 DIAGNOSIS — I428 Other cardiomyopathies: Secondary | ICD-10-CM

## 2019-09-11 DIAGNOSIS — Z87891 Personal history of nicotine dependence: Secondary | ICD-10-CM | POA: Diagnosis not present

## 2019-09-11 DIAGNOSIS — Z96653 Presence of artificial knee joint, bilateral: Secondary | ICD-10-CM | POA: Diagnosis not present

## 2019-09-11 DIAGNOSIS — Z7982 Long term (current) use of aspirin: Secondary | ICD-10-CM | POA: Insufficient documentation

## 2019-09-11 DIAGNOSIS — E785 Hyperlipidemia, unspecified: Secondary | ICD-10-CM | POA: Diagnosis not present

## 2019-09-11 DIAGNOSIS — Z885 Allergy status to narcotic agent status: Secondary | ICD-10-CM | POA: Diagnosis not present

## 2019-09-11 DIAGNOSIS — I2510 Atherosclerotic heart disease of native coronary artery without angina pectoris: Secondary | ICD-10-CM | POA: Diagnosis not present

## 2019-09-11 DIAGNOSIS — I5022 Chronic systolic (congestive) heart failure: Secondary | ICD-10-CM

## 2019-09-11 DIAGNOSIS — M109 Gout, unspecified: Secondary | ICD-10-CM | POA: Diagnosis not present

## 2019-09-11 DIAGNOSIS — J449 Chronic obstructive pulmonary disease, unspecified: Secondary | ICD-10-CM | POA: Insufficient documentation

## 2019-09-11 DIAGNOSIS — E854 Organ-limited amyloidosis: Secondary | ICD-10-CM

## 2019-09-11 DIAGNOSIS — I6523 Occlusion and stenosis of bilateral carotid arteries: Secondary | ICD-10-CM | POA: Diagnosis not present

## 2019-09-11 DIAGNOSIS — I080 Rheumatic disorders of both mitral and aortic valves: Secondary | ICD-10-CM | POA: Diagnosis not present

## 2019-09-11 DIAGNOSIS — Z8249 Family history of ischemic heart disease and other diseases of the circulatory system: Secondary | ICD-10-CM | POA: Diagnosis not present

## 2019-09-11 DIAGNOSIS — E8582 Wild-type transthyretin-related (ATTR) amyloidosis: Secondary | ICD-10-CM | POA: Insufficient documentation

## 2019-09-11 DIAGNOSIS — Z95810 Presence of automatic (implantable) cardiac defibrillator: Secondary | ICD-10-CM | POA: Insufficient documentation

## 2019-09-11 DIAGNOSIS — Z7951 Long term (current) use of inhaled steroids: Secondary | ICD-10-CM | POA: Insufficient documentation

## 2019-09-11 DIAGNOSIS — I739 Peripheral vascular disease, unspecified: Secondary | ICD-10-CM | POA: Diagnosis not present

## 2019-09-11 DIAGNOSIS — I43 Cardiomyopathy in diseases classified elsewhere: Secondary | ICD-10-CM

## 2019-09-11 DIAGNOSIS — E1142 Type 2 diabetes mellitus with diabetic polyneuropathy: Secondary | ICD-10-CM | POA: Diagnosis not present

## 2019-09-11 DIAGNOSIS — Z79890 Hormone replacement therapy (postmenopausal): Secondary | ICD-10-CM | POA: Insufficient documentation

## 2019-09-11 DIAGNOSIS — Z7984 Long term (current) use of oral hypoglycemic drugs: Secondary | ICD-10-CM | POA: Insufficient documentation

## 2019-09-11 DIAGNOSIS — M170 Bilateral primary osteoarthritis of knee: Secondary | ICD-10-CM | POA: Insufficient documentation

## 2019-09-11 LAB — BASIC METABOLIC PANEL
Anion gap: 12 (ref 5–15)
BUN: 35 mg/dL — ABNORMAL HIGH (ref 8–23)
CO2: 28 mmol/L (ref 22–32)
Calcium: 9.8 mg/dL (ref 8.9–10.3)
Chloride: 101 mmol/L (ref 98–111)
Creatinine, Ser: 1.27 mg/dL — ABNORMAL HIGH (ref 0.44–1.00)
GFR calc Af Amer: 49 mL/min — ABNORMAL LOW (ref >60)
GFR calc non Af Amer: 42 mL/min — ABNORMAL LOW (ref >60)
Glucose, Bld: 144 mg/dL — ABNORMAL HIGH (ref 70–99)
Potassium: 4.6 mmol/L (ref 3.5–5.1)
Sodium: 141 mmol/L (ref 135–145)

## 2019-09-11 LAB — DIGOXIN LEVEL: Digoxin Level: 0.4 ng/mL — ABNORMAL LOW (ref 0.8–2.0)

## 2019-09-11 MED ORDER — PANTOPRAZOLE SODIUM 40 MG PO TBEC: 40.0000 mg | DELAYED_RELEASE_TABLET | Freq: Every day | ORAL | 5 refills | Status: DC

## 2019-09-11 MED ORDER — DAPAGLIFLOZIN PROPANEDIOL 10 MG PO TABS: 10.0000 mg | ORAL_TABLET | Freq: Every day | ORAL | 6 refills | Status: DC

## 2019-09-11 NOTE — Progress Notes (Signed)
Date:  09/11/2019   ID:  Lisa [name], DOB 12-31-1945, MRN [id number]  Provider location: [facility] Type of Visit: Established patient  PCP:  Kelton Pillar, MD  Cardiologist:  Loralie Champagne, MD   History of Present Illness: Lisa [name] is a 73 y.o. female with a history of PAD, COPD, carotid stenosis, and chronic systolic CHF.    Patient has a long history of vascular disease.  She had left-right fem-fem bypass in 3/16. She has moderate bilateral carotid stenosis.  Echo in 2/18 showed EF 20-25%.  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  She was admitted in 9/18 to the hospital in [facility] for about 4 days with acute on chronic systolic CHF.    RHC/LHC in 12/18 showed nonobstructive coronary disease, low filling pressures, and CI 2.0.   Admitted 09/20/17 with volume overload. Diuresed with IV lasix. Weight went down 2 pounds. Discharged on torsemide 40 mg daily. Discharge weight 179 pounds.   Echo in 12/18 with EF 20-25%, mild basal ventricular hypertrophy, severe mid to apical LV hypertrophy.  Repeat echo in 2/19 was done with Definity contrast, showing that what was thought to be apical hypertrophy was actually prominent mid-apical trabeculation suggestive of noncompaction.   She was noted to have Bence-Jones protein on UPEP.  However, SPEP was negative and 24 hours UPEP was also negative. Abdominal fat pad biopsy did not show evidence of amyloidosis.  She saw heme-onc, probably does not have AL amyloidosis.  However, in 2/19, she had TcPYP scan that was strongly suggestive of transthyretin amyloidosis. She has peripheral neuropathy that may be due to amyloidosis as well (versus diabetes). 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   Admitted 6/24-6/26/19 for LLL PNA. She received IV abx and prednisone. BP medications were held, but restarted at discharge.   Echo in 11/19 showed EF 30-35%, mild  LVH, mild-moderate MR.   Cardiolite in 5/20 showed EF 30%, no ischemia or infarction.   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   She returns for followup of CHF.  Weight is stable.  No significant dyspnea walking on flat ground, she feels like volume is well-controlled.  She did take 1 dose of metolazone last week. No orthopnea/PND.  No lightheadedness.  She reports GERD symptoms after meals and when she lies in bed at night.  She continues to have neuropathic-type foot pain.   Labs (8/18): LDL 80, HDL 46 Labs (10/18): K 4.2, creatinine 1.1 Labs (11/18): digoxin 0.3 Labs (12/18): K 3.5, creatinine 1.02, UPEP with faint Bence-Jones protein but 24 hour UPEP negative and SPEP negative.  Labs (1/19): LDL 79, HDL 38, BNP 493, hgb 13.6, K 5.8 => 4.8, creatinine 1.18, digoxin 0.3 Labs (2/19): K 3.3, creatinine 1.06 Labs (3/19): digoxin < 0.2 Labs (4/19): K 5, creatinine 0.97 Labs (6/19): K 3.5, creatinine 1.19 Labs (7/19): digoxin < 0.2, K 4.4, creatinine 1.14 Labs (2/20): K 4.2, creatinine 1.19 Labs (5/20): LDL 79, K 4.2, creatinine 1.47, digoxin level 0.5 Labs (6/20): K 4.8, creatinine 1.77 => 1.24, digoxin 0.5  Labs (9/20): K 4.9, creatinine 1.33, digoxin 0.5, LDL 50  ECG (personally reviewed): NSR, 1st degree AV block, LAFB, poor RWP  PMH: 1. PAD: L=>R fem-fem bypass in 3/16.   - ABIs (3/18): Mildly abnormal, 0.81 left and 0.85 right.  - ABIs (4/19): 0.68 left and 0.66 right -  ABIs (9/20): 0.69 left and 0.74 right 2. H/o appendectomy 3. COPD: Quit smoking in 2/12.  - High resolution CT chest (2/19): No interstitial lung disease, +emphysema.  4. Arthritis: Bilateral TKR 5. Diet-controlled diabetes 6. Hyperlipidemia 7. Carotid stenosis:  - 10/18 Carotid dopplers: 60-79% RICA stenosis, A999333 LICA stenosis, left subclavian steal.  - 4/19 Carotid dopplers:  [id number] RICA, A999333 LICA, left subclavian stenosis.  - 9/20 Carotid dopplers: [id number] RICA, A999333 LICA 8. Chronic systolic CHF: Echo (0000000) with EF 20-25%, mild LV dilation, normal RV size and systolic function. 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 .  - LHC (10/17): Nonobstructive CAD.  - Biotronik ICD.  - LHC/RHC (12/18): 50% proximal PLOM (no obstructive disease). Mean RA 1, PA 32/10 mean 19, mean PCWP 7, CI 2.0.  - Echo (12/18): EF 20-25%, mild basal LV hypertrophy, severe mid to apical LV hypertrophy, moderate diastolic dysfunction, mild MR, PASP 42 mmHg.  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  - Workup appears negative for AL amyloidosis.  Negative abdominal fat pad biopsy.  However, PYP scan was suggestive of transthyretin amyloidosis.  - Echo (11/19): EF 30-35%, mild LVH, mild-moderate MR, RV normal.  - Cardiolite (5/20): EF 30%, no ischemia/infarction.  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  10. Peripheral neuropathy: ?due to amyloidosis versus diabetes.  11. Gout 12. H/o Bell's palsy 13. GERD  Current Outpatient Medications  Medication Sig Dispense Refill  . albuterol (PROVENTIL HFA;VENTOLIN HFA) 108 (90 Base) MCG/ACT inhaler Inhale 2 puffs into the lungs every 6 (six) hours as needed for wheezing or shortness of breath. 1 Inhaler 6  . albuterol (PROVENTIL) (2.5 MG/3ML) 0.083% nebulizer solution Take 3 mLs by nebulization every 4 (four) hours as needed for wheezing.    [name] allopurinol (ZYLOPRIM) 100 MG tablet Take 100 mg by mouth daily.    [name] aspirin EC 81  MG tablet Take 162 mg by mouth daily.    [name] atorvastatin (LIPITOR) 20 MG tablet TAKE 1 TABLET BY MOUTH EVERY DAY 90 tablet 1  . carvedilol (COREG) 12.5 MG tablet TAKE 1 TABLET (12.5 MG TOTAL) BY MOUTH 2 (TWO) TIMES DAILY WITH A MEAL. 180 tablet 1  . colchicine 0.6 MG tablet PLEASE SEE ATTACHED FOR DETAILED DIRECTIONS    . Cyanocobalamin (VITAMIN B 12 PO) Take by mouth. Gummy    . digoxin (LANOXIN) 0.125 MG tablet TAKE 0.5 TABLETS (62.5 MCG TOTAL) BY MOUTH DAILY. 45 tablet 0  . fluticasone furoate-vilanterol (BREO ELLIPTA) 200-25 MCG/INH AEPB Inhale 1 puff into the lungs daily.    [name] levothyroxine (SYNTHROID, LEVOTHROID) 25 MCG tablet Take 25 mcg by mouth daily before breakfast.    . losartan (COZAAR) 25 MG tablet Take 0.5 tablets (12.5 mg total) by mouth daily. 45 tablet 1  . metFORMIN (GLUCOPHAGE-XR) 500 MG 24 hr tablet Take 500 mg by mouth 2 (two) times daily.    Lebron Quam 5-325 MG tablet Take 5-325 mg by mouth as needed.    . nortriptyline (PAMELOR) 10 MG capsule nortriptyline 10 mg capsule    . pantoprazole (PROTONIX) 40 MG tablet Take 1 tablet (40 mg total) by mouth at bedtime. 30 tablet 5  . pregabalin (  LYRICA) 100 MG capsule Take 100 mg by mouth every evening.     [name] spironolactone (ALDACTONE) 25 MG tablet Take 1 tablet (25 mg total) by mouth daily. 90 tablet 3  . Tafamidis Meglumine, Cardiac, (VYNDAQEL) 20 MG CAPS Take 80 mg by mouth daily. 120 capsule 11  . torsemide (DEMADEX) 20 MG tablet Take 2 tablets (40 mg total) by mouth daily. 180 tablet 1  . TRULICITY A999333 0000000 SOPN Inject every Friday    . dapagliflozin propanediol (FARXIGA) 10 MG TABS tablet Take 10 mg by mouth daily before breakfast. 30 tablet 6   No current facility-administered medications for this encounter.    Allergies:   Oxycodone, Cymbalta [duloxetine hcl], Entresto [sacubitril-valsartan], and Nortriptyline   Social History:  The patient  reports that she quit smoking about 8 years ago. Her smoking use included  cigarettes. She has never used smokeless tobacco. She reports that she does not drink alcohol or use drugs.   Family History:  The patient's family history includes Heart disease in her mother; Hypertension in her mother.   ROS:  Please see the history of present illness.   All other systems are personally reviewed and negative.   Exam:   BP (!) 114/52   Pulse 60   Wt 79.2 kg (174 lb 9.6 oz)   SpO2 100%   BMI 29.05 kg/m  General: NAD Neck: No JVD, no thyromegaly or thyroid nodule.  Lungs: Clear to auscultation bilaterally with normal respiratory effort. CV: Nondisplaced PMI.  Heart regular S1/S2, no S3/S4, no murmur.  No peripheral edema.  No carotid bruit.  Normal pedal pulses.  Abdomen: Soft, nontender, no hepatosplenomegaly, no distention.  Skin: Intact without lesions or rashes.  Neurologic: Alert and oriented x 3.  Psych: Normal affect. Extremities: No clubbing or cyanosis.  HEENT: Normal.   Recent Labs: 10/08/2018: B Natriuretic Peptide 392.0; Hemoglobin 12.5; Platelets 302 03/06/2019: Magnesium 1.7 06/12/2019: TSH 1.877 09/11/2019: BUN 35; Creatinine, Ser 1.27; Potassium 4.6; Sodium 141  Personally reviewed   Wt Readings from Last 3 Encounters:  09/11/19 79.2 kg (174 lb 9.6 oz)  06/23/19 79 kg (174 lb 3.2 oz)  06/12/19 79.2 kg (174 lb 9.6 oz)    ASSESSMENT AND PLAN:  1. Chronic systolic CHF: Nonischemic cardiomyopathy.  EF 20-25% on echo in 2/18.  LHC/RHC in 12/18 with low filling pressures, CI 2.0, and nonobstructive CAD. She has a Biotronik ICD.  Also of note, daughter and nephew have sarcoidosis => patient had a chest CT in 2/19 without evidence for pulmonary sarcoidosis.  We are unable to do an MRI to look for infiltrative disease due to ICD.  Echo in 12/18 showed EF 20-25%, noted to have LVH pattern that is mild in the basal segments and moderate to severe in the apical segments. This was concerning for apical HCM and study was repeated in 2/19 with Definity contrast.   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 We also did a workup for amyloidosis. She did not have evidence for AL amyloidosis, but TcPYP scan in 2/19 was strongly suggestive of transthyretin amyloidosis.  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 Genetic testing was negative, suggesting wild-type TTR amyloidosis. Echo was done today and reviewed, showing EF 20% with evidence for noncompaction cardiomyopathy. NYHA class II symptoms with stable weight.  She is not volume overloaded on exam.  - Continue torsemide 40 mg daily. BMET  today.  - She did not tolerate low dose Entresto. Losartan decreased in the past with elevated creatinine.  Continue losartan 12.5 mg daily.    - Continue spironolactone 25 mg daily.  - Continue digoxin, check digoxin level.  - Continue Coreg 12.5 mg bid.   - I will have her stop glimepiride and start dapagliflozin 10 mg daily.  BMET 10 days.  - Narrow QRS, not CRT candidate.   2. PAD: s/p fem-fem bypass, followed by [facility].  Last ABIs in 9/20 with moderate disease, stable from past. Pain in legs seems more consistent with neuropathy than claudication.  3. Carotid stenosis: Moderate stenosis on dopplers. Follows with [facility]. No change 4. Hyperlipidemia: Continue atorvastatin, goal LDL < 70 given vascular disease. Good lipids in 9/20.   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 She has peripheral neuropathy.  Genetic testing negative, wild-type.  - Continue tafamidis.    Followup in 3 wks with HF pharmacist for med titration, see me in 6 wks.   Signed, Loralie Champagne, MD  09/11/2019  [facility] [address] [facility] [place] [address] [phone] (office) [phone] (fax)

## 2019-09-11 NOTE — Patient Instructions (Signed)
STOP Glimiperide  START Farxiga 10mg  ( 1 tab) daily  A refill for protonix has been sent to your pharmacy  Labs today and repeat in 10 days We will only contact you if something comes back abnormal or we need to make some changes. Otherwise no news is good news!  Your physician recommends that you schedule a follow-up appointment in: 3 weeks with Pharmacy and 6 weeks with Dr Aundra Dubin  At the [facility], you and your health needs are our priority. As part of our continuing mission to provide you with exceptional heart care, we have created designated Provider Care Teams. These Care Teams include your primary Cardiologist (physician) and Advanced Practice Providers (APPs- Physician Assistants and Nurse Practitioners) who all work together to provide you with the care you need, when you need it.   You may see any of the following providers on your designated Care Team at your next follow up: [name] Dr Glori Bickers . Dr Loralie Champagne . Darrick Grinder, NP . Lyda Jester, PA . Audry Riles, PharmD   Please be sure to bring in all your medications bottles to every appointment.

## 2019-09-11 NOTE — Progress Notes (Signed)
Remote ICD transmission.   

## 2019-09-15 ENCOUNTER — Telehealth (HOSPITAL_COMMUNITY): Payer: Self-pay | Admitting: Pharmacist

## 2019-09-15 NOTE — Telephone Encounter (Signed)
Received message that Wilder Glade was not affordable for patient. Wilder Glade is not on her insurance's formulary. Will change to Jardiance (empagliflozin). Jardiance copay is $0.

## 2019-09-15 NOTE — Telephone Encounter (Signed)
Wilder Glade is non formulary, Jardiance 10mg  daily is no co-pay. Can we update with Jardiance instead?

## 2019-09-22 ENCOUNTER — Other Ambulatory Visit (HOSPITAL_COMMUNITY): Payer: Self-pay | Admitting: Cardiology

## 2019-09-22 ENCOUNTER — Other Ambulatory Visit: Payer: Self-pay

## 2019-09-22 ENCOUNTER — Ambulatory Visit (HOSPITAL_COMMUNITY)
Admission: RE | Admit: 2019-09-22 | Discharge: 2019-09-22 | Disposition: A | Discharge status: Home / Self Care | Payer: Medicare Other | Source: Ambulatory Visit | Attending: Cardiology | Admitting: Cardiology

## 2019-09-22 DIAGNOSIS — I428 Other cardiomyopathies: Secondary | ICD-10-CM | POA: Insufficient documentation

## 2019-09-22 LAB — BASIC METABOLIC PANEL
Anion gap: 12 (ref 5–15)
BUN: 33 mg/dL — ABNORMAL HIGH (ref 8–23)
CO2: 31 mmol/L (ref 22–32)
Calcium: 10 mg/dL (ref 8.9–10.3)
Chloride: 98 mmol/L (ref 98–111)
Creatinine, Ser: 1.5 mg/dL — ABNORMAL HIGH (ref 0.44–1.00)
GFR calc Af Amer: 40 mL/min — ABNORMAL LOW (ref >60)
GFR calc non Af Amer: 34 mL/min — ABNORMAL LOW (ref >60)
Glucose, Bld: 157 mg/dL — ABNORMAL HIGH (ref 70–99)
Potassium: 4.2 mmol/L (ref 3.5–5.1)
Sodium: 141 mmol/L (ref 135–145)

## 2019-10-02 ENCOUNTER — Inpatient Hospital Stay (HOSPITAL_COMMUNITY): Admission: RE | Admit: 2019-10-02 | Payer: Medicare Other | Source: Ambulatory Visit

## 2019-10-03 ENCOUNTER — Other Ambulatory Visit (HOSPITAL_COMMUNITY): Payer: Self-pay | Admitting: Cardiology

## 2019-10-03 ENCOUNTER — Telehealth (HOSPITAL_COMMUNITY): Payer: Self-pay | Admitting: Pharmacist

## 2019-10-03 MED ORDER — VYNDAMAX 61 MG PO CAPS: 61.0000 mg | ORAL_CAPSULE | Freq: Every day | ORAL | 11 refills | Status: AC

## 2019-10-03 NOTE — Telephone Encounter (Signed)
Received message from patient that she thought she was having an allergic reaction to Jardiance (empagliflozin). She complained of whole body itching that starting after she initiated the empagliflozin. No swelling of her tongue or mouth. No other medication changes during this time. Advised patient to stop taking empagliflozin. Will update patient medication list and allergies.   Audry Riles, PharmD, BCPS, BCCP, CPP [facility] Clinic Pharmacist [phone]

## 2019-10-03 NOTE — Telephone Encounter (Signed)
Patient Advocate Encounter  Was successful in securing patient an $8,000.00 grant from [employer] to provide copayment coverage for [employer]. This will keep the out of pocket expense at $0.     I will call the patient and set her up with our specialty pharmacy services.    The billing information is as follows and has been shared with [facility].   Member ID: [id number] Group ID: [id number] RxBin: [id number] Dates of Eligibility: 10/03/2019 through 09/01/2020  Fund:  [place]

## 2019-10-03 NOTE — Telephone Encounter (Signed)
Sent in [place] prescription to [facility].  Audry Riles, PharmD, BCPS, BCCP, CPP Heart Failure Clinic Pharmacist [phone]

## 2019-10-06 DIAGNOSIS — U071 COVID-19: Secondary | ICD-10-CM

## 2019-10-06 HISTORY — DX: COVID-19: U07.1

## 2019-10-07 ENCOUNTER — Encounter (HOSPITAL_COMMUNITY): Payer: Self-pay | Admitting: *Deleted

## 2019-10-07 ENCOUNTER — Telehealth (HOSPITAL_COMMUNITY): Payer: Self-pay | Admitting: *Deleted

## 2019-10-07 NOTE — Telephone Encounter (Signed)
error 

## 2019-10-10 ENCOUNTER — Encounter (HOSPITAL_COMMUNITY): Payer: Self-pay | Admitting: Emergency Medicine

## 2019-10-10 ENCOUNTER — Inpatient Hospital Stay (HOSPITAL_COMMUNITY)
Admission: EM | Admit: 2019-10-10 | Discharge: 2019-10-20 | DRG: 177 | Disposition: A | Discharge status: Home Health | Payer: Medicare Other | Attending: Internal Medicine | Admitting: Internal Medicine

## 2019-10-10 ENCOUNTER — Other Ambulatory Visit: Payer: Self-pay

## 2019-10-10 ENCOUNTER — Emergency Department (HOSPITAL_COMMUNITY): Payer: Medicare Other

## 2019-10-10 DIAGNOSIS — I428 Other cardiomyopathies: Secondary | ICD-10-CM | POA: Diagnosis present

## 2019-10-10 DIAGNOSIS — Z885 Allergy status to narcotic agent status: Secondary | ICD-10-CM

## 2019-10-10 DIAGNOSIS — I493 Ventricular premature depolarization: Secondary | ICD-10-CM | POA: Diagnosis present

## 2019-10-10 DIAGNOSIS — M546 Pain in thoracic spine: Secondary | ICD-10-CM | POA: Diagnosis not present

## 2019-10-10 DIAGNOSIS — N182 Chronic kidney disease, stage 2 (mild): Secondary | ICD-10-CM | POA: Diagnosis present

## 2019-10-10 DIAGNOSIS — Z95810 Presence of automatic (implantable) cardiac defibrillator: Secondary | ICD-10-CM | POA: Diagnosis not present

## 2019-10-10 DIAGNOSIS — Z23 Encounter for immunization: Secondary | ICD-10-CM

## 2019-10-10 DIAGNOSIS — U071 COVID-19: Principal | ICD-10-CM | POA: Diagnosis present

## 2019-10-10 DIAGNOSIS — D7589 Other specified diseases of blood and blood-forming organs: Secondary | ICD-10-CM | POA: Diagnosis present

## 2019-10-10 DIAGNOSIS — Z6841 Body Mass Index (BMI) 40.0 and over, adult: Secondary | ICD-10-CM

## 2019-10-10 DIAGNOSIS — J1282 Pneumonia due to coronavirus disease 2019: Secondary | ICD-10-CM | POA: Diagnosis present

## 2019-10-10 DIAGNOSIS — Z7984 Long term (current) use of oral hypoglycemic drugs: Secondary | ICD-10-CM

## 2019-10-10 DIAGNOSIS — M159 Polyosteoarthritis, unspecified: Secondary | ICD-10-CM | POA: Diagnosis present

## 2019-10-10 DIAGNOSIS — J9601 Acute respiratory failure with hypoxia: Secondary | ICD-10-CM | POA: Diagnosis present

## 2019-10-10 DIAGNOSIS — Z87891 Personal history of nicotine dependence: Secondary | ICD-10-CM

## 2019-10-10 DIAGNOSIS — E1165 Type 2 diabetes mellitus with hyperglycemia: Secondary | ICD-10-CM | POA: Diagnosis present

## 2019-10-10 DIAGNOSIS — Z7982 Long term (current) use of aspirin: Secondary | ICD-10-CM

## 2019-10-10 DIAGNOSIS — Z8249 Family history of ischemic heart disease and other diseases of the circulatory system: Secondary | ICD-10-CM

## 2019-10-10 DIAGNOSIS — E669 Obesity, unspecified: Secondary | ICD-10-CM | POA: Diagnosis present

## 2019-10-10 DIAGNOSIS — I130 Hypertensive heart and chronic kidney disease with heart failure and stage 1 through stage 4 chronic kidney disease, or unspecified chronic kidney disease: Secondary | ICD-10-CM | POA: Diagnosis present

## 2019-10-10 DIAGNOSIS — K219 Gastro-esophageal reflux disease without esophagitis: Secondary | ICD-10-CM | POA: Diagnosis present

## 2019-10-10 DIAGNOSIS — Z79890 Hormone replacement therapy (postmenopausal): Secondary | ICD-10-CM

## 2019-10-10 DIAGNOSIS — E1122 Type 2 diabetes mellitus with diabetic chronic kidney disease: Secondary | ICD-10-CM | POA: Diagnosis present

## 2019-10-10 DIAGNOSIS — E870 Hyperosmolality and hypernatremia: Secondary | ICD-10-CM | POA: Diagnosis not present

## 2019-10-10 DIAGNOSIS — N179 Acute kidney failure, unspecified: Secondary | ICD-10-CM | POA: Diagnosis present

## 2019-10-10 DIAGNOSIS — J440 Chronic obstructive pulmonary disease with acute lower respiratory infection: Secondary | ICD-10-CM | POA: Diagnosis present

## 2019-10-10 DIAGNOSIS — E1159 Type 2 diabetes mellitus with other circulatory complications: Secondary | ICD-10-CM

## 2019-10-10 DIAGNOSIS — I5022 Chronic systolic (congestive) heart failure: Secondary | ICD-10-CM | POA: Diagnosis present

## 2019-10-10 DIAGNOSIS — Z888 Allergy status to other drugs, medicaments and biological substances status: Secondary | ICD-10-CM

## 2019-10-10 DIAGNOSIS — G9341 Metabolic encephalopathy: Secondary | ICD-10-CM | POA: Diagnosis present

## 2019-10-10 DIAGNOSIS — R0902 Hypoxemia: Secondary | ICD-10-CM

## 2019-10-10 DIAGNOSIS — R4182 Altered mental status, unspecified: Secondary | ICD-10-CM | POA: Diagnosis not present

## 2019-10-10 DIAGNOSIS — E039 Hypothyroidism, unspecified: Secondary | ICD-10-CM | POA: Diagnosis present

## 2019-10-10 DIAGNOSIS — R4587 Impulsiveness: Secondary | ICD-10-CM | POA: Diagnosis not present

## 2019-10-10 DIAGNOSIS — E785 Hyperlipidemia, unspecified: Secondary | ICD-10-CM | POA: Diagnosis present

## 2019-10-10 DIAGNOSIS — Z79899 Other long term (current) drug therapy: Secondary | ICD-10-CM

## 2019-10-10 DIAGNOSIS — N390 Urinary tract infection, site not specified: Secondary | ICD-10-CM | POA: Diagnosis present

## 2019-10-10 DIAGNOSIS — B9620 Unspecified Escherichia coli [E. coli] as the cause of diseases classified elsewhere: Secondary | ICD-10-CM | POA: Diagnosis present

## 2019-10-10 DIAGNOSIS — G510 Bell's palsy: Secondary | ICD-10-CM | POA: Diagnosis present

## 2019-10-10 DIAGNOSIS — E875 Hyperkalemia: Secondary | ICD-10-CM | POA: Diagnosis not present

## 2019-10-10 DIAGNOSIS — E1151 Type 2 diabetes mellitus with diabetic peripheral angiopathy without gangrene: Secondary | ICD-10-CM | POA: Diagnosis present

## 2019-10-10 DIAGNOSIS — T380X5A Adverse effect of glucocorticoids and synthetic analogues, initial encounter: Secondary | ICD-10-CM | POA: Diagnosis present

## 2019-10-10 DIAGNOSIS — I951 Orthostatic hypotension: Secondary | ICD-10-CM | POA: Diagnosis present

## 2019-10-10 DIAGNOSIS — I2510 Atherosclerotic heart disease of native coronary artery without angina pectoris: Secondary | ICD-10-CM | POA: Diagnosis present

## 2019-10-10 DIAGNOSIS — E1142 Type 2 diabetes mellitus with diabetic polyneuropathy: Secondary | ICD-10-CM | POA: Diagnosis present

## 2019-10-10 LAB — COMPREHENSIVE METABOLIC PANEL
ALT: 18 U/L (ref 0–44)
AST: 24 U/L (ref 15–41)
Albumin: 3.7 g/dL (ref 3.5–5.0)
Alkaline Phosphatase: 52 U/L (ref 38–126)
Anion gap: 14 (ref 5–15)
BUN: 39 mg/dL — ABNORMAL HIGH (ref 8–23)
CO2: 26 mmol/L (ref 22–32)
Calcium: 9.1 mg/dL (ref 8.9–10.3)
Chloride: 94 mmol/L — ABNORMAL LOW (ref 98–111)
Creatinine, Ser: 2.05 mg/dL — ABNORMAL HIGH (ref 0.44–1.00)
GFR calc Af Amer: 27 mL/min — ABNORMAL LOW (ref >60)
GFR calc non Af Amer: 24 mL/min — ABNORMAL LOW (ref >60)
Glucose, Bld: 242 mg/dL — ABNORMAL HIGH (ref 70–99)
Potassium: 4.3 mmol/L (ref 3.5–5.1)
Sodium: 134 mmol/L — ABNORMAL LOW (ref 135–145)
Total Bilirubin: 0.8 mg/dL (ref 0.3–1.2)
Total Protein: 7.9 g/dL (ref 6.5–8.1)

## 2019-10-10 LAB — CBC WITH DIFFERENTIAL/PLATELET
Abs Immature Granulocytes: 0.02 10*3/uL (ref 0.00–0.07)
Basophils Absolute: 0 10*3/uL (ref 0.0–0.1)
Basophils Relative: 0 %
Eosinophils Absolute: 0.1 10*3/uL (ref 0.0–0.5)
Eosinophils Relative: 1 %
HCT: 39.1 % (ref 36.0–46.0)
Hemoglobin: 12.4 g/dL (ref 12.0–15.0)
Immature Granulocytes: 0 %
Lymphocytes Relative: 15 %
Lymphs Abs: 0.9 10*3/uL (ref 0.7–4.0)
MCH: 32.6 pg (ref 26.0–34.0)
MCHC: 31.7 g/dL (ref 30.0–36.0)
MCV: 102.9 fL — ABNORMAL HIGH (ref 80.0–100.0)
Monocytes Absolute: 0.3 10*3/uL (ref 0.1–1.0)
Monocytes Relative: 5 %
Neutro Abs: 5 10*3/uL (ref 1.7–7.7)
Neutrophils Relative %: 79 %
Platelets: 211 10*3/uL (ref 150–400)
RBC: 3.8 MIL/uL — ABNORMAL LOW (ref 3.87–5.11)
RDW: 14.2 % (ref 11.5–15.5)
WBC: 6.4 10*3/uL (ref 4.0–10.5)
nRBC: 0 % (ref 0.0–0.2)

## 2019-10-10 LAB — HEMOGLOBIN A1C
Hgb A1c MFr Bld: 8.3 % — ABNORMAL HIGH (ref 4.8–5.6)
Mean Plasma Glucose: 191.51 mg/dL

## 2019-10-10 LAB — LACTIC ACID, PLASMA: Lactic Acid, Venous: 1.3 mmol/L (ref 0.5–1.9)

## 2019-10-10 LAB — PROCALCITONIN: Procalcitonin: 0.32 ng/mL

## 2019-10-10 LAB — GLUCOSE, CAPILLARY: Glucose-Capillary: 391 mg/dL — ABNORMAL HIGH (ref 70–99)

## 2019-10-10 LAB — FIBRINOGEN: Fibrinogen: 770 mg/dL — ABNORMAL HIGH (ref 210–475)

## 2019-10-10 LAB — TRIGLYCERIDES: Triglycerides: 102 mg/dL (ref <150)

## 2019-10-10 LAB — FERRITIN: Ferritin: 204 ng/mL (ref 11–307)

## 2019-10-10 LAB — LACTATE DEHYDROGENASE: LDH: 228 U/L — ABNORMAL HIGH (ref 98–192)

## 2019-10-10 LAB — C-REACTIVE PROTEIN: CRP: 13 mg/dL — ABNORMAL HIGH (ref <1.0)

## 2019-10-10 LAB — BRAIN NATRIURETIC PEPTIDE: B Natriuretic Peptide: 317.2 pg/mL — ABNORMAL HIGH (ref 0.0–100.0)

## 2019-10-10 LAB — CBG MONITORING, ED: Glucose-Capillary: 305 mg/dL — ABNORMAL HIGH (ref 70–99)

## 2019-10-10 LAB — D-DIMER, QUANTITATIVE: D-Dimer, Quant: 1.48 ug/mL-FEU — ABNORMAL HIGH (ref 0.00–0.50)

## 2019-10-10 LAB — SARS CORONAVIRUS 2 (TAT 6-24 HRS): SARS Coronavirus 2: POSITIVE — AB

## 2019-10-10 MED ORDER — SODIUM CHLORIDE 0.9 % IV SOLN
100.0000 mg | Freq: Every day | INTRAVENOUS | Status: AC
  Administered 2019-10-11 – 2019-10-14 (×4): 100 mg via INTRAVENOUS
  Filled 2019-10-10 (×3): qty 20

## 2019-10-10 MED ORDER — SENNOSIDES-DOCUSATE SODIUM 8.6-50 MG PO TABS: 1.0000 | ORAL_TABLET | Freq: Every evening | ORAL | Status: DC | PRN

## 2019-10-10 MED ORDER — GUAIFENESIN-DM 100-10 MG/5ML PO SYRP: 10.0000 mL | ORAL_SOLUTION | ORAL | Status: DC | PRN

## 2019-10-10 MED ORDER — CARVEDILOL 12.5 MG PO TABS
12.5000 mg | ORAL_TABLET | Freq: Two times a day (BID) | ORAL | Status: DC
  Administered 2019-10-10: 17:00:00 12.5 mg via ORAL
  Filled 2019-10-10 (×2): qty 1

## 2019-10-10 MED ORDER — ATORVASTATIN CALCIUM 10 MG PO TABS: 20.0000 mg | ORAL_TABLET | Freq: Every day | ORAL | Status: DC

## 2019-10-10 MED ORDER — INSULIN ASPART 100 UNIT/ML ~~LOC~~ SOLN
0.0000 [IU] | Freq: Three times a day (TID) | SUBCUTANEOUS | Status: DC
  Administered 2019-10-10 – 2019-10-11 (×2): 7 [IU] via SUBCUTANEOUS

## 2019-10-10 MED ORDER — ALBUTEROL SULFATE HFA 108 (90 BASE) MCG/ACT IN AERS
2.0000 | INHALATION_SPRAY | Freq: Four times a day (QID) | RESPIRATORY_TRACT | Status: DC | PRN
  Administered 2019-10-10 – 2019-10-18 (×3): 2 via RESPIRATORY_TRACT
  Filled 2019-10-10: qty 6.7

## 2019-10-10 MED ORDER — TAFAMIDIS 61 MG PO CAPS: 61.0000 mg | ORAL_CAPSULE | Freq: Every day | ORAL | Status: DC

## 2019-10-10 MED ORDER — ACETAMINOPHEN 325 MG PO TABS
650.0000 mg | ORAL_TABLET | Freq: Four times a day (QID) | ORAL | Status: DC | PRN
  Administered 2019-10-10 – 2019-10-11 (×2): 650 mg via ORAL
  Filled 2019-10-10 (×2): qty 2

## 2019-10-10 MED ORDER — LEVOTHYROXINE SODIUM 50 MCG PO TABS
25.0000 ug | ORAL_TABLET | Freq: Every day | ORAL | Status: DC
  Administered 2019-10-11: 25 ug via ORAL
  Filled 2019-10-10: qty 1

## 2019-10-10 MED ORDER — LOSARTAN POTASSIUM 25 MG PO TABS
12.5000 mg | ORAL_TABLET | Freq: Every day | ORAL | Status: DC
  Filled 2019-10-10: qty 0.5

## 2019-10-10 MED ORDER — NORTRIPTYLINE HCL 10 MG PO CAPS
10.0000 mg | ORAL_CAPSULE | Freq: Every day | ORAL | Status: DC
  Administered 2019-10-10: 10 mg via ORAL
  Filled 2019-10-10 (×3): qty 1

## 2019-10-10 MED ORDER — SODIUM CHLORIDE 0.9 % IV SOLN
200.0000 mg | Freq: Once | INTRAVENOUS | Status: AC
  Administered 2019-10-10: 17:00:00 200 mg via INTRAVENOUS
  Filled 2019-10-10: qty 40

## 2019-10-10 MED ORDER — ASPIRIN EC 81 MG PO TBEC: 162.0000 mg | DELAYED_RELEASE_TABLET | Freq: Every day | ORAL | Status: DC

## 2019-10-10 MED ORDER — PREGABALIN 50 MG PO CAPS
100.0000 mg | ORAL_CAPSULE | Freq: Every evening | ORAL | Status: DC
  Administered 2019-10-10: 19:00:00 100 mg via ORAL
  Filled 2019-10-10: qty 1

## 2019-10-10 MED ORDER — TORSEMIDE 20 MG PO TABS
40.0000 mg | ORAL_TABLET | Freq: Every day | ORAL | Status: DC
  Filled 2019-10-10 (×2): qty 2

## 2019-10-10 MED ORDER — SPIRONOLACTONE 25 MG PO TABS: 25.0000 mg | ORAL_TABLET | Freq: Every day | ORAL | Status: DC

## 2019-10-10 MED ORDER — DEXAMETHASONE SODIUM PHOSPHATE 10 MG/ML IJ SOLN
6.0000 mg | Freq: Once | INTRAMUSCULAR | Status: AC
  Administered 2019-10-10: 6 mg via INTRAVENOUS
  Filled 2019-10-10: qty 1

## 2019-10-10 MED ORDER — ALLOPURINOL 100 MG PO TABS: 100.0000 mg | ORAL_TABLET | Freq: Every day | ORAL | Status: DC

## 2019-10-10 MED ORDER — HEPARIN SODIUM (PORCINE) 5000 UNIT/ML IJ SOLN
5000.0000 [IU] | Freq: Two times a day (BID) | INTRAMUSCULAR | Status: DC
  Administered 2019-10-10 – 2019-10-11 (×2): 5000 [IU] via SUBCUTANEOUS
  Filled 2019-10-10 (×2): qty 1

## 2019-10-10 MED ORDER — DIGOXIN 0.0625 MG HALF TABLET
0.0625 mg | ORAL_TABLET | Freq: Every day | ORAL | Status: DC
  Administered 2019-10-10: 0.0625 mg via ORAL
  Filled 2019-10-10 (×2): qty 1

## 2019-10-10 MED ORDER — COLCHICINE 0.6 MG PO TABS
0.6000 mg | ORAL_TABLET | ORAL | Status: DC | PRN
  Filled 2019-10-10: qty 1

## 2019-10-10 MED ORDER — DEXAMETHASONE 6 MG PO TABS: 6.0000 mg | ORAL_TABLET | ORAL | Status: DC

## 2019-10-10 MED ORDER — DEXAMETHASONE 4 MG PO TABS: 6.0000 mg | ORAL_TABLET | ORAL | Status: DC

## 2019-10-10 MED ORDER — HYDROCODONE-ACETAMINOPHEN 7.5-325 MG PO TABS: 1.0000 | ORAL_TABLET | Freq: Four times a day (QID) | ORAL | Status: DC | PRN

## 2019-10-10 MED ORDER — FLUTICASONE FUROATE-VILANTEROL 200-25 MCG/INH IN AEPB
1.0000 | INHALATION_SPRAY | Freq: Every day | RESPIRATORY_TRACT | Status: DC
  Filled 2019-10-10: qty 28

## 2019-10-10 NOTE — ED Provider Notes (Addendum)
[facility] EMERGENCY DEPARTMENT Provider Note   CSN: [id number] Arrival date & time: 10/10/19  1140     History Chief Complaint  Patient presents with  . Shortness of Breath    Lisa [name] is a 74 y.o. female.  HPI Lisa [name] is a 74y/o female with PMH of CHF with an AICD, COPD, HTN, T2DM, HLD, CAD who presents today with SOB and need for oxygen secondary to COVID infection. She began having symptoms last week but overall felt well. She went and got tested on 1/11 and tested positive for COVID. Yesterday she began having SOB and increased difficulty breathing. Today her symptoms worsened and EMS was called where she was found to be hypoxic and required oxygen. Her initial O2 sats via EMS was in the 60s that responded with 15L HFNC with non-rebreather mask. While in the ED she was seen to have oxygen desaturation on 3L down to 87% and required an increase to 6L where she was stable and kept an O2 sat of over 92%. She also complains of mild mid back pain that is achy and dull that began yesterday. No other complaints.    Past Medical History:  Diagnosis Date  . AICD (automatic cardioverter/defibrillator) present 2014   Biotroniks  . Arthritis    knees with arthritis as well as has back, pt. remarks that her hands fall asleep when she is in the bed or in certain positions   . Bell's palsy   . Carotid artery disease ([place])    a. duplex - moderate bilateral carotid disease (60-79% 06/2017).  . Chronic systolic CHF (congestive heart failure) ([place])   . COPD (chronic obstructive pulmonary disease) ([place])   . COVID-19 10/06/2019   rapid test positive at local urgent care  . Diabetes mellitus without complication (HCC)    borderline  . Diabetic neuropathy ([place]) 05/26/2019  . Dyspnea   . Former tobacco use   . GERD (gastroesophageal reflux disease)    uses otc for occas. heartburn   . Hyperlipidemia   . Hypertension   . Mild CAD    a. nonobstructive CAD (50%  PLOM 08/2017).  . Neuropathy   . Non-ischemic cardiomyopathy ([place])   . PAD (peripheral artery disease) ([place])    a.  s/p L-R fem-fem BPG 2016.  [name] Pneumonia    hosp. in [place]-2015  . Subclavian steal syndrome    a. h/o left subclavian steal.    Patient Active Problem List   Diagnosis Date Noted  . COVID-19 10/06/2019  . Diabetic neuropathy ([place]) 05/26/2019  . Acute on chronic combined systolic and diastolic heart failure ([place]) 10/09/2018  . COPD (chronic obstructive pulmonary disease) ([place]) 10/09/2018  . Wild-type transthyretin-related (ATTR) amyloidosis ([place])   . Chest pain 07/27/2018  . Hyperglycemia 07/27/2018  . Type 2 diabetes mellitus with vascular disease ([place]) 07/27/2018  . Community acquired pneumonia 03/18/2018  . Acute on chronic systolic CHF (congestive heart failure) ([place]) 09/20/2017  . Mild CAD 09/20/2017  . COPD exacerbation ([place]) 09/20/2017  . Wide-complex tachycardia ([place]) 09/20/2017  . Moderate mitral regurgitation 09/20/2017  . Moderate tricuspid regurgitation 09/20/2017  . Acute on chronic systolic (congestive) heart failure ([place]) 09/20/2017  . Acute on chronic combined systolic and diastolic CHF (congestive heart failure) ([place]) 04/09/2017  . Non-ischemic cardiomyopathy ([place]) 04/09/2017  . Dyslipidemia 04/09/2017  . Cardiac defibrillator in place 04/09/2017  . Essential hypertension 04/09/2017  . Discoloration of skin of toe-Right 3rd Toe 12/31/2014  . Wound drainage-Right Groin  12/31/2014  . Right leg swelling 12/31/2014  . Pain in joint, ankle and foot 12/31/2014  . 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 Paresthesia of both hands 03/19/2014  . Numbness- Right foot 03/19/2014  . Right foot pain 03/19/2014  . Peripheral vascular disease ([place]) 12/26/2012  . Occlusion and stenosis of carotid artery without mention of cerebral infarction 11/14/2012    Past Surgical History:  Procedure Laterality Date    . ABDOMINAL AORTAGRAM N/A 11/27/2014   Procedure: ABDOMINAL Maxcine Ham;  Surgeon: Elam Dutch, MD;  Location: [facility] CATH LAB;  Service: Cardiovascular;  Laterality: N/A;  . ABDOMINAL HYSTERECTOMY    . APPENDECTOMY    . CARDIAC CATHETERIZATION     02/20/13: Normal coronaries, moderate LV dysfunction, EF 35%. Medical RX ([facility])  . CARDIAC DEFIBRILLATOR PLACEMENT  Aug. 2014   dual chamber Biot[employer] ICD 05/06/13 ([facility], Dr. Minna Merritts)  . ENDARTERECTOMY FEMORAL Right 12/21/2014   Procedure: ENDARTERECTOMY FEMORAL;  Surgeon: Elam Dutch, MD;  Location: [facility];  Service: Vascular;  Laterality: Right;  . FEMORAL-FEMORAL BYPASS GRAFT Bilateral 12/21/2014   Procedure: BYPASS GRAFT LEFT FEMORAL-RIGHT FEMORAL ARTERY;  Surgeon: Elam Dutch, MD;  Location: [facility];  Service: Vascular;  Laterality: Bilateral;  . JOINT REPLACEMENT Bilateral    implants  . RIGHT/LEFT HEART CATH AND CORONARY ANGIOGRAPHY N/A 08/28/2017   Procedure: RIGHT/LEFT HEART CATH AND CORONARY ANGIOGRAPHY;  Surgeon: Larey Dresser, MD;  Location: [facility] CV LAB;  Service: Cardiovascular;  Laterality: N/A;  . TUBAL LIGATION       OB History   No obstetric history on file.     Family History  Problem Relation Age of Onset  . Heart disease Mother   . Hypertension Mother     Social History   Tobacco Use  . Smoking status: Former Smoker    Types: Cigarettes    Quit date: 11/14/2010    Years since quitting: 8.9  . Smokeless tobacco: Never Used  Substance Use Topics  . Alcohol use: No    Alcohol/week: 0.0 standard drinks  . Drug use: No    Home Medications Prior to Admission medications   Medication Sig Start Date End Date Taking? Authorizing Provider  albuterol (PROVENTIL HFA;VENTOLIN HFA) 108 (90 Base) MCG/ACT inhaler Inhale 2 puffs into the lungs every 6 (six) hours as needed for wheezing or shortness of breath. 03/20/18   Jackelynn Hosie, DO  albuterol (PROVENTIL) (2.5 MG/3ML) 0.083% nebulizer solution Take 3 mLs by  nebulization every 4 (four) hours as needed for wheezing. 08/01/17   [provider]  allopurinol (ZYLOPRIM) 100 MG tablet Take 100 mg by mouth daily.    [provider]  aspirin EC 81 MG tablet Take 162 mg by mouth daily.    [provider]  atorvastatin (LIPITOR) 20 MG tablet TAKE 1 TABLET BY MOUTH EVERY DAY 09/01/19   Larey Dresser, MD  carvedilol (COREG) 12.5 MG tablet TAKE 1 TABLET (12.5 MG TOTAL) BY MOUTH 2 (TWO) TIMES DAILY WITH A MEAL. 06/30/19   Larey Dresser, MD  colchicine 0.6 MG tablet PLEASE SEE ATTACHED FOR DETAILED DIRECTIONS 09/01/19   [provider]  Cyanocobalamin (VITAMIN B 12 PO) Take by mouth. Gummy    [provider]  digoxin (LANOXIN) 0.125 MG tablet TAKE 0.5 TABLETS (62.5 MCG TOTAL) BY MOUTH DAILY. 10/03/19   Larey Dresser, MD  digoxin (LANOXIN) 0.125 MG tablet TAKE 1/2 TABLET BY MOUTH EVERY DAY 10/03/19  Larey Dresser, MD  fluticasone furoate-vilanterol (BREO ELLIPTA) 200-25 MCG/INH AEPB Inhale 1 puff into the lungs daily.    [provider]  levothyroxine (SYNTHROID, LEVOTHROID) 25 MCG tablet Take 25 mcg by mouth daily before breakfast.    [provider]  losartan (COZAAR) 25 MG tablet TAKE 1/2 TABLET BY MOUTH EVERY DAY 09/22/19   Larey Dresser, MD  metFORMIN (GLUCOPHAGE-XR) 500 MG 24 hr tablet Take 500 mg by mouth 2 (two) times daily. 10/03/18   [provider]  NORCO 5-325 MG tablet Take 5-325 mg by mouth as needed. 10/25/18   [provider]  nortriptyline (PAMELOR) 10 MG capsule nortriptyline 10 mg capsule    [provider]  pantoprazole (PROTONIX) 40 MG tablet Take 1 tablet (40 mg total) by mouth at bedtime. 09/11/19   Larey Dresser, MD  pregabalin (LYRICA) 100 MG capsule Take 100 mg by mouth every evening.     [provider]  spironolactone (ALDACTONE) 25 MG tablet Take 1 tablet (25 mg total) by mouth daily. 01/27/19   Larey Dresser, MD  Tafamidis  [facility]) 61 MG CAPS Take 61 mg by mouth daily. 10/03/19   Larey Dresser, MD  torsemide (DEMADEX) 20 MG tablet Take 2 tablets (40 mg total) by mouth daily. 05/08/19   Larey Dresser, MD  TRULICITY [id number] SOPN Inject every Friday 05/29/19   [provider]    Allergies    Oxycodone, Cymbalta [duloxetine hcl], Empagliflozin, Entresto [sacubitril-valsartan], and Nortriptyline  Review of Systems   Review of Systems  Constitutional: Positive for activity change and fatigue. Negative for chills and fever.  HENT: Negative for postnasal drip, rhinorrhea, sneezing and sore throat.   Respiratory: Positive for cough and shortness of breath. Negative for chest tightness and wheezing.   Cardiovascular: Negative for chest pain and leg swelling.  Gastrointestinal: Positive for diarrhea and nausea. Negative for abdominal pain, constipation and vomiting.  Genitourinary: Negative for difficulty urinating, dysuria, frequency and urgency.  Skin: Negative for color change, pallor and rash.  Neurological: Positive for light-headedness. Negative for syncope and weakness.    Physical Exam Updated Vital Signs BP (!) 89/57 (BP Location: Right Arm)   Pulse (!) 27   Temp 98.2 F (36.8 C) (Oral)   Resp (!) 24   Ht 5\' 5"  (1.651 m)   Wt 79.4 kg   SpO2 93%   BMI 29.12 kg/m   Physical Exam Constitutional:      Appearance: She is ill-appearing. She is not toxic-appearing.  HENT:     Head: Normocephalic and atraumatic.  Eyes:     Pupils: Pupils are equal, round, and reactive to light.  Neck:     Vascular: No JVD.  Cardiovascular:     Rate and Rhythm: Normal rate and regular rhythm.     Heart sounds: No murmur.  Pulmonary:     Effort: No tachypnea or respiratory distress.     Breath sounds: Examination of the right-lower field reveals rales. Examination of the left-lower field reveals rales. Decreased breath sounds and rales present. No wheezing or rhonchi.  Chest:     Chest wall: No  tenderness.  Abdominal:     General: Bowel sounds are normal.     Palpations: Abdomen is soft.     Tenderness: There is no abdominal tenderness. There is no guarding or rebound.  Musculoskeletal:     Cervical back: Neck supple.  Skin:    General: Skin is warm and dry.  Capillary Refill: Capillary refill takes less than 2 seconds.  Neurological:     General: No focal deficit present.     Mental Status: She is alert.     ED Results / Procedures / Treatments   Labs (all labs ordered are listed, but only abnormal results are displayed) Labs Reviewed  BRAIN NATRIURETIC PEPTIDE - Abnormal; Notable for the following components:      Result Value   B Natriuretic Peptide 317.2 (*)    All other components within normal limits  COMPREHENSIVE METABOLIC PANEL - Abnormal; Notable for the following components:   Sodium 134 (*)    Chloride 94 (*)    Glucose, Bld 242 (*)    BUN 39 (*)    Creatinine, Ser 2.05 (*)    GFR calc non Af Amer 24 (*)    GFR calc Af Amer 27 (*)    All other components within normal limits  D-DIMER, QUANTITATIVE (NOT AT [facility]) - Abnormal; Notable for the following components:   D-Dimer, Quant 1.48 (*)    All other components within normal limits  LACTATE DEHYDROGENASE - Abnormal; Notable for the following components:   LDH 228 (*)    All other components within normal limits  FIBRINOGEN - Abnormal; Notable for the following components:   Fibrinogen 770 (*)    All other components within normal limits  SARS CORONAVIRUS 2 (TAT 6-24 HRS)  CULTURE, BLOOD (ROUTINE X 2)  CULTURE, BLOOD (ROUTINE X 2)  LACTIC ACID, PLASMA  PROCALCITONIN  CBC WITH DIFFERENTIAL/PLATELET  CBC WITH DIFFERENTIAL/PLATELET  FERRITIN  TRIGLYCERIDES  C-REACTIVE PROTEIN    EKG None  Radiology DG Chest Port 1 View  Result Date: 10/10/2019 CLINICAL DATA:  Pt from home, was having a virtual visit with PCP, Doc observed her being very short of breath and called EMS. Pt tested positive  1/11. EXAM: PORTABLE CHEST 1 VIEW COMPARISON:  07/26/2018. FINDINGS: Cardiac silhouette is mildly enlarged. No mediastinal or hilar masses. Lungs demonstrate prominent bronchovascular markings. There is also intervening hazy opacity most evident in right lower lung, more subtly suggested peripherally in the right mid lung and left lung base. No convincing pleural effusion.  No pneumothorax. Left anterior chest wall AICD is stable. Skeletal structures are demineralized but grossly intact. IMPRESSION: 1. Subtle hazy lung opacities are superimposed upon chronically prominent bronchovascular markings, most evident in the right lower lung. Findings are suspicious for multifocal infection. Electronically Signed   By: Lajean Manes M.D.   On: 10/10/2019 13:39    Procedures Procedures (including critical care time)  Medications Ordered in ED Medications  dexamethasone (DECADRON) injection 6 mg (has no administration in time range)    ED Course  I have reviewed the triage vital signs and the nursing notes.  Pertinent labs & imaging results that were available during my care of the patient were reviewed by me and considered in my medical decision making (see chart for details).  Lisa [name] is a 74y/o female with known COVID presenting for SOB and found to have acute hypoxic respiratory failure secondary to COVID-19. She had no known home oxygen requirement and was found at home by EMS with O2 sats in the 60s requiring 15L HFNC. She responded well and in the ED has been stable on 6L of  O2. Her pertinent lab work was significant for an elevated d-dimer but no chest pain, pleuritic pain, and with known COVID infection and no tachycardia and good response with oxygen a chest CTA was not  ordered as I had low suspicion for PE. COVID labs were significant for normal lactic acid at 1.3 and a Creatnine of 2.05 indicating AKI. BNP was elevated but not above her baseline.    MDM Rules/Calculators/A&P                       Lisa [name] was evaluated in Emergency Department on 10/10/2019 for the symptoms described in the history of present illness. She was evaluated in the context of the global COVID-19 pandemic, which necessitated consideration that the patient might be at risk for infection with the SARS-CoV-2 virus that causes COVID-19. Institutional protocols and algorithms that pertain to the evaluation of patients at risk for COVID-19 are in a state of rapid change based on information released by regulatory bodies including the CDC and federal and state organizations. These policies and algorithms were followed during the patient's care in the ED.   Final Clinical Impression(s) / ED Diagnoses Final diagnoses:  COVID-19  Hypoxic  Hypoxia    Rx / DC Orders ED Discharge Orders    None       Nuala Alpha, DO 10/10/19 [place]    Elnora Morrison, MD 10/10/19 [place], [place], DO 10/10/19 1449    Elnora Morrison, MD 10/13/19 1257

## 2019-10-10 NOTE — Plan of Care (Signed)
Patients will remain free from falls and injury and VS stable throughout shift.

## 2019-10-10 NOTE — ED Triage Notes (Signed)
Pt from home, was having a virtual visit with PCP, Doc observed her being very short of breath and called EMS. Pt tested positive 1/11. Fire department reported pt was 60% on RA. NRB 15 L/min came up to 99%. Hx of COPD pt says she usually sits at 95% on RA. Nasal Cannula on arrival oxygen at 93%

## 2019-10-10 NOTE — H&P (Signed)
History and Physical    Lisa [name] [id number] DOB: 29-Nov-1945 DOA: 10/10/2019  PCP: Kelton Pillar, MD (Confirm with patient/family/NH records and if not entered, this has to be entered at [facility] point of entry) Patient coming from: Home    Chief Complaint: Cough, Short of breath  HPI: Lisa [name] is a 74 y.o. female with medical history significant of CHF (EF20%) with an AICD, COPD, HTN, T2DM, HLD, CAD who presents today with cough, SOB. Her symptoms started week but was mild initially. She went and got tested on 1/11 and tested positive for COVID.  Her symptoms of SOB and increased difficulty breathing became worse yesterday. Today her symptoms worsened and EMS arrived and found her initial O2 sats was in the 60s that responded with 15L HFNC with non-rebreather mask. While in the ED she was seen to have oxygen desaturation on 3L down to 87% and required an increase to 6L where she was stable and kept an O2 sat of over 92%. She also complains of mild mid back pain that is achy and dull that began yesterday. No loss of taste no diarrhea. ED Course: O2 saturation stabilized at 92 to 93% on 6 L.  Review of Systems: As per HPI otherwise 10 point review of systems negative.    Past Medical History:  Diagnosis Date  . AICD (automatic cardioverter/defibrillator) present 2014   Biotroniks  . Arthritis    knees with arthritis as well as has back, pt. remarks that her hands fall asleep when she is in the bed or in certain positions   . Bell's palsy   . Carotid artery disease ([place])    a. duplex - moderate bilateral carotid disease (60-79% 06/2017).  . Chronic systolic CHF (congestive heart failure) ([place])   . COPD (chronic obstructive pulmonary disease) ([place])   . COVID-19 10/06/2019   rapid test positive at local urgent care  . Diabetes mellitus without complication (HCC)    borderline  . Diabetic neuropathy ([place]) 05/26/2019  . Dyspnea   . Former tobacco use   . GERD  (gastroesophageal reflux disease)    uses otc for occas. heartburn   . Hyperlipidemia   . Hypertension   . Mild CAD    a. nonobstructive CAD (50% PLOM 08/2017).  . Neuropathy   . Non-ischemic cardiomyopathy ([place])   . PAD (peripheral artery disease) ([place])    a.  s/p L-R fem-fem BPG 2016.  [name] Pneumonia    hosp. in [place]-2015  . Subclavian steal syndrome    a. h/o left subclavian steal.    Past Surgical History:  Procedure Laterality Date  . ABDOMINAL AORTAGRAM N/A 11/27/2014   Procedure: ABDOMINAL Maxcine Ham;  Surgeon: Elam Dutch, MD;  Location: [facility] CATH LAB;  Service: Cardiovascular;  Laterality: N/A;  . ABDOMINAL HYSTERECTOMY    . APPENDECTOMY    . CARDIAC CATHETERIZATION     02/20/13: Normal coronaries, moderate LV dysfunction, EF 35%. Medical RX ([facility])  . CARDIAC DEFIBRILLATOR PLACEMENT  Aug. 2014   dual chamber Biotronik ICD 05/06/13 ([facility], Dr. Minna Merritts)  . ENDARTERECTOMY FEMORAL Right 12/21/2014   Procedure: ENDARTERECTOMY FEMORAL;  Surgeon: Elam Dutch, MD;  Location: [place];  Service: Vascular;  Laterality: Right;  . FEMORAL-FEMORAL BYPASS GRAFT Bilateral 12/21/2014   Procedure: BYPASS GRAFT LEFT FEMORAL-RIGHT FEMORAL ARTERY;  Surgeon: Elam Dutch, MD;  Location: [place];  Service: Vascular;  Laterality: Bilateral;  . JOINT REPLACEMENT Bilateral    implants  . RIGHT/LEFT HEART CATH AND CORONARY  ANGIOGRAPHY N/A 08/28/2017   Procedure: RIGHT/LEFT HEART CATH AND CORONARY ANGIOGRAPHY;  Surgeon: Larey Dresser, MD;  Location: [facility] CV LAB;  Service: Cardiovascular;  Laterality: N/A;  . TUBAL LIGATION       reports that she quit smoking about 8 years ago. Her smoking use included cigarettes. She has never used smokeless tobacco. She reports that she does not drink alcohol or use drugs.  Allergies  Allergen Reactions  . Oxycodone Shortness Of Breath  . Cymbalta [Duloxetine Hcl]     Loopy feeling  . Empagliflozin Itching    Whole body itching  . Entresto  [Sacubitril-Valsartan] Other (See Comments)    Impaired coordination---causes patient to drop many things, blood pressure bottomed out  . Nortriptyline     Legs jumping    Family History  Problem Relation Age of Onset  . Heart disease Mother   . Hypertension Mother      Prior to Admission medications   Medication Sig Start Date End Date Taking? Authorizing Provider  albuterol (PROVENTIL HFA;VENTOLIN HFA) 108 (90 Base) MCG/ACT inhaler Inhale 2 puffs into the lungs every 6 (six) hours as needed for wheezing or shortness of breath. 03/20/18  Yes Lockamy, Timothy, DO  albuterol (PROVENTIL) (2.5 MG/3ML) 0.083% nebulizer solution Take 3 mLs by nebulization every 4 (four) hours as needed for wheezing. 08/01/17  Yes [provider]  allopurinol (ZYLOPRIM) 100 MG tablet Take 100 mg by mouth daily.   Yes [provider]  aspirin EC 81 MG tablet Take 162 mg by mouth daily.   Yes [provider]  atorvastatin (LIPITOR) 20 MG tablet TAKE 1 TABLET BY MOUTH EVERY DAY Patient taking differently: Take 20 mg by mouth daily at 6 PM.  09/01/19  Yes Larey Dresser, MD  carvedilol (COREG) 12.5 MG tablet TAKE 1 TABLET (12.5 MG TOTAL) BY MOUTH 2 (TWO) TIMES DAILY WITH A MEAL. 06/30/19  Yes Larey Dresser, MD  colchicine 0.6 MG tablet Take 0.6 mg by mouth as needed (gout).  09/01/19  Yes [provider]  Cyanocobalamin (VITAMIN B 12 PO) Take by mouth. Gummy   Yes [provider]  digoxin (LANOXIN) 0.125 MG tablet TAKE 0.5 TABLETS (62.5 MCG TOTAL) BY MOUTH DAILY. Patient taking differently: Take 0.0625 mg by mouth daily. 1/2 tab daily 10/03/19  Yes Larey Dresser, MD  fluticasone furoate-vilanterol (BREO ELLIPTA) 200-25 MCG/INH AEPB Inhale 1 puff into the lungs daily.   Yes [provider]  HYDROcodone-acetaminophen (NORCO) 7.5-325 MG tablet Take 1 tablet by mouth every 6 (six) hours as needed for pain.   Yes [provider]  levothyroxine (SYNTHROID,  LEVOTHROID) 25 MCG tablet Take 25 mcg by mouth daily before breakfast.   Yes [provider]  losartan (COZAAR) 25 MG tablet TAKE 1/2 TABLET BY MOUTH EVERY DAY Patient taking differently: Take 12.5 mg by mouth daily.  09/22/19  Yes Larey Dresser, MD  metFORMIN (GLUCOPHAGE-XR) 500 MG 24 hr tablet Take 500 mg by mouth 2 (two) times daily. 10/03/18  Yes [provider]  nortriptyline (PAMELOR) 10 MG capsule Take 10 mg by mouth at bedtime.    Yes [provider]  pregabalin (LYRICA) 100 MG capsule Take 100 mg by mouth every evening.    Yes [provider]  spironolactone (ALDACTONE) 25 MG tablet Take 1 tablet (25 mg total) by mouth daily. 01/27/19  Yes Larey Dresser, MD  torsemide (DEMADEX) 20 MG tablet Take 2 tablets (40 mg total) by mouth  daily. 05/08/19  Yes Larey Dresser, MD  TRULICITY 1.5 0000000 SOPN Inject 1.5 mg into the skin once a week. Inject every Friday 05/29/19  Yes [provider]  digoxin (LANOXIN) 0.125 MG tablet TAKE 1/2 TABLET BY MOUTH EVERY DAY Patient not taking: Reported on 10/10/2019 10/03/19   Larey Dresser, MD  pantoprazole (PROTONIX) 40 MG tablet Take 1 tablet (40 mg total) by mouth at bedtime. Patient not taking: Reported on 10/10/2019 09/11/19   Larey Dresser, MD  Tafamidis [facility]) 61 MG CAPS Take 61 mg by mouth daily. 10/03/19   Larey Dresser, MD    Physical Exam: Vitals:   10/10/19 1149 10/10/19 1152 10/10/19 1300  BP: (!) 89/57  (!) 106/95  Pulse: (!) 27  60  Resp: (!) 24  15  Temp: 98.2 F (36.8 C)    TempSrc: Oral    SpO2: 93%  92%  Weight:  79.4 kg   Height:  5\' 5"  (1.651 m)     Constitutional: NAD, calm, comfortable Vitals:   10/10/19 1149 10/10/19 1152 10/10/19 1300  BP: (!) 89/57  (!) 106/95  Pulse: (!) 27  60  Resp: (!) 24  15  Temp: 98.2 F (36.8 C)    TempSrc: Oral    SpO2: 93%  92%  Weight:  79.4 kg   Height:  5\' 5"  (1.651 m)    Eyes: PERRL, lids and conjunctivae normal ENMT: Mucous  membranes are dry. Posterior pharynx clear of any exudate or lesions.Normal dentition.  Neck: normal, supple, no masses, no thyromegaly Respiratory: clear to auscultation bilaterally, no crackles or wheezing, increased respiratory effort.  Signs of accessory muscle use positive.  Cardiovascular: Regular rate and rhythm, no murmurs / rubs / gallops.  1+ extremity edema. 2+ pedal pulses. No carotid bruits.  Abdomen: no tenderness, no masses palpated. No hepatosplenomegaly. Bowel sounds positive.  Musculoskeletal: no clubbing / cyanosis. No joint deformity upper and lower extremities. Good ROM, no contractures. Normal muscle tone.  Skin: no rashes, lesions, ulcers. No induration Neurologic: CN 2-12 grossly intact. Sensation intact, DTR normal. Strength 5/5 in all 4.  Psychiatric: Normal judgment and insight. Alert and oriented x 3. Normal mood.     Labs on Admission: I have personally reviewed following labs and imaging studies  CBC: No results for input(s): WBC, NEUTROABS, HGB, HCT, MCV, PLT in the last 168 hours. Basic Metabolic Panel: Recent Labs  Lab 10/10/19 1257  NA 134*  K 4.3  CL 94*  CO2 26  GLUCOSE 242*  BUN 39*  CREATININE 2.05*  CALCIUM 9.1   GFR: Estimated Creatinine Clearance: 25.8 mL/min (A) (by C-G formula based on SCr of 2.05 mg/dL (H)). Liver Function Tests: Recent Labs  Lab 10/10/19 1257  AST 24  ALT 18  ALKPHOS 52  BILITOT 0.8  PROT 7.9  ALBUMIN 3.7   No results for input(s): LIPASE, AMYLASE in the last 168 hours. No results for input(s): AMMONIA in the last 168 hours. Coagulation Profile: No results for input(s): INR, PROTIME in the last 168 hours. Cardiac Enzymes: No results for input(s): CKTOTAL, CKMB, CKMBINDEX, TROPONINI in the last 168 hours. BNP (last 3 results) No results for input(s): PROBNP in the last 8760 hours. HbA1C: No results for input(s): HGBA1C in the last 72 hours. CBG: No results for input(s): GLUCAP in the last 168  hours. Lipid Profile: Recent Labs    10/10/19 1257  TRIG 102   Thyroid Function Tests: No results for input(s): TSH, T4TOTAL, FREET4,  T3FREE, THYROIDAB in the last 72 hours. Anemia Panel: Recent Labs    10/10/19 1257  FERRITIN 204   Urine analysis:    Component Value Date/Time   COLORURINE YELLOW 07/27/2018 0250   APPEARANCEUR CLEAR 07/27/2018 0250   LABSPEC 1.011 07/27/2018 0250   PHURINE 5.0 07/27/2018 0250   GLUCOSEU 150 (A) 07/27/2018 0250   HGBUR NEGATIVE 07/27/2018 0250   BILIRUBINUR NEGATIVE 07/27/2018 0250   KETONESUR NEGATIVE 07/27/2018 0250   PROTEINUR NEGATIVE 07/27/2018 0250   UROBILINOGEN 0.2 12/11/2014 1131   NITRITE NEGATIVE 07/27/2018 0250   LEUKOCYTESUR TRACE (A) 07/27/2018 0250    Radiological Exams on Admission: DG Chest Port 1 View  Result Date: 10/10/2019 CLINICAL DATA:  Pt from home, was having a virtual visit with PCP, Doc observed her being very short of breath and called EMS. Pt tested positive 1/11. EXAM: PORTABLE CHEST 1 VIEW COMPARISON:  07/26/2018. FINDINGS: Cardiac silhouette is mildly enlarged. No mediastinal or hilar masses. Lungs demonstrate prominent bronchovascular markings. There is also intervening hazy opacity most evident in right lower lung, more subtly suggested peripherally in the right mid lung and left lung base. No convincing pleural effusion.  No pneumothorax. Left anterior chest wall AICD is stable. Skeletal structures are demineralized but grossly intact. IMPRESSION: 1. Subtle hazy lung opacities are superimposed upon chronically prominent bronchovascular markings, most evident in the right lower lung. Findings are suspicious for multifocal infection. Electronically Signed   By: Lajean Manes M.D.   On: 10/10/2019 13:39    EKG: Independently reviewed.  Frequent PVCs and prolonged QTC of 579  Assessment/Plan Active Problems:   COVID-19  Acute hypoxic respite failure secondary to COVID-19 pneumonia, start patient on remdesivir  and Decadron, procalcitonin level low will hold off additional antibiotics.  Discussed with patient regarding her CODE STATUS, patient says " if it becomes necessary, I would like to be intubated".  Encourage frequent proning.  D-dimer elevated, which will be trended and consider PE study if trending up.  Chronic systolic CHF, clinically she looks a bit overload, will continue her home CHF medication including p.o. Lasix, but hold ARB and Spironolactone for AKI.  AKI on CKD stage II, will hold off ARB and spironolactone untill her kidney function improves/stabilized.  Frequent PVCs, she has AICD, Check Mg and Phos, continue Coreg.  Prolonged QTC, Repeat EKG in the morning.  Hypothyroidism, on Synthroid.    DVT prophylaxis: Heparin subQ Code Status: Full code Family Communication: Left message to patient's daughter Dmitry Disposition Plan: Pending clinical progress Consults called: None Admission status: Tele admission to [facility] MD [facility] Pager [phone]  If 7PM-7AM, please contact night-coverage [email or web address] Password [facility]  10/10/2019, 4:24 PM

## 2019-10-10 NOTE — ED Notes (Signed)
[phone] [place] pts niece, please update

## 2019-10-10 NOTE — Progress Notes (Signed)
Spoke to pts daughter to give update. Family will be notified when bed assignment is given.

## 2019-10-10 NOTE — ED Notes (Signed)
Pt daughter given update. Notification will be made when bed assignment is made.

## 2019-10-10 NOTE — ED Notes (Signed)
[phone] pts daughter Adela Lank pts daughter

## 2019-10-11 DIAGNOSIS — G9341 Metabolic encephalopathy: Secondary | ICD-10-CM

## 2019-10-11 LAB — COMPREHENSIVE METABOLIC PANEL
ALT: 17 U/L (ref 0–44)
AST: 28 U/L (ref 15–41)
Albumin: 3.5 g/dL (ref 3.5–5.0)
Alkaline Phosphatase: 47 U/L (ref 38–126)
Anion gap: 14 (ref 5–15)
BUN: 43 mg/dL — ABNORMAL HIGH (ref 8–23)
CO2: 22 mmol/L (ref 22–32)
Calcium: 8.6 mg/dL — ABNORMAL LOW (ref 8.9–10.3)
Chloride: 98 mmol/L (ref 98–111)
Creatinine, Ser: 1.84 mg/dL — ABNORMAL HIGH (ref 0.44–1.00)
GFR calc Af Amer: 31 mL/min — ABNORMAL LOW (ref >60)
GFR calc non Af Amer: 27 mL/min — ABNORMAL LOW (ref >60)
Glucose, Bld: 376 mg/dL — ABNORMAL HIGH (ref 70–99)
Potassium: 5 mmol/L (ref 3.5–5.1)
Sodium: 134 mmol/L — ABNORMAL LOW (ref 135–145)
Total Bilirubin: 0.7 mg/dL (ref 0.3–1.2)
Total Protein: 7.5 g/dL (ref 6.5–8.1)

## 2019-10-11 LAB — D-DIMER, QUANTITATIVE: D-Dimer, Quant: 2.49 ug/mL-FEU — ABNORMAL HIGH (ref 0.00–0.50)

## 2019-10-11 LAB — POCT I-STAT 7, (LYTES, BLD GAS, ICA,H+H)
Acid-Base Excess: 2 mmol/L (ref 0.0–2.0)
Bicarbonate: 29.1 mmol/L — ABNORMAL HIGH (ref 20.0–28.0)
Calcium, Ion: 1.19 mmol/L (ref 1.15–1.40)
HCT: 39 % (ref 36.0–46.0)
Hemoglobin: 13.3 g/dL (ref 12.0–15.0)
O2 Saturation: 90 %
Potassium: 4.6 mmol/L (ref 3.5–5.1)
Sodium: 137 mmol/L (ref 135–145)
TCO2: 31 mmol/L (ref 22–32)
pCO2 arterial: 56.1 mmHg — ABNORMAL HIGH (ref 32.0–48.0)
pH, Arterial: 7.323 — ABNORMAL LOW (ref 7.350–7.450)
pO2, Arterial: 65 mmHg — ABNORMAL LOW (ref 83.0–108.0)

## 2019-10-11 LAB — CBC WITH DIFFERENTIAL/PLATELET
Abs Immature Granulocytes: 0.03 10*3/uL (ref 0.00–0.07)
Basophils Absolute: 0 10*3/uL (ref 0.0–0.1)
Basophils Relative: 0 %
Eosinophils Absolute: 0 10*3/uL (ref 0.0–0.5)
Eosinophils Relative: 0 %
HCT: 38.4 % (ref 36.0–46.0)
Hemoglobin: 12 g/dL (ref 12.0–15.0)
Immature Granulocytes: 1 %
Lymphocytes Relative: 8 %
Lymphs Abs: 0.5 10*3/uL — ABNORMAL LOW (ref 0.7–4.0)
MCH: 32.5 pg (ref 26.0–34.0)
MCHC: 31.3 g/dL (ref 30.0–36.0)
MCV: 104.1 fL — ABNORMAL HIGH (ref 80.0–100.0)
Monocytes Absolute: 0.3 10*3/uL (ref 0.1–1.0)
Monocytes Relative: 4 %
Neutro Abs: 5.6 10*3/uL (ref 1.7–7.7)
Neutrophils Relative %: 87 %
Platelets: 170 10*3/uL (ref 150–400)
RBC: 3.69 MIL/uL — ABNORMAL LOW (ref 3.87–5.11)
RDW: 14.2 % (ref 11.5–15.5)
WBC: 6.4 10*3/uL (ref 4.0–10.5)
nRBC: 0 % (ref 0.0–0.2)

## 2019-10-11 LAB — MAGNESIUM: Magnesium: 1.7 mg/dL (ref 1.7–2.4)

## 2019-10-11 LAB — GLUCOSE, CAPILLARY
Glucose-Capillary: 348 mg/dL — ABNORMAL HIGH (ref 70–99)
Glucose-Capillary: 229 mg/dL — ABNORMAL HIGH (ref 70–99)
Glucose-Capillary: 195 mg/dL — ABNORMAL HIGH (ref 70–99)

## 2019-10-11 LAB — C-REACTIVE PROTEIN: CRP: 13.47 mg/dL — ABNORMAL HIGH (ref <1.0)

## 2019-10-11 LAB — FERRITIN: Ferritin: 286 ng/mL (ref 11–307)

## 2019-10-11 LAB — PHOSPHORUS: Phosphorus: 4.6 mg/dL (ref 2.5–4.6)

## 2019-10-11 MED ORDER — MAGNESIUM SULFATE 2 GM/50ML IV SOLN
2.0000 g | Freq: Once | INTRAVENOUS | Status: AC
  Administered 2019-10-11: 12:00:00 2 g via INTRAVENOUS
  Filled 2019-10-11: qty 50

## 2019-10-11 MED ORDER — DIGOXIN 0.25 MG/ML IJ SOLN
0.0625 mg | Freq: Every day | INTRAMUSCULAR | Status: DC
  Administered 2019-10-11 – 2019-10-12 (×2): 0.0625 mg via INTRAVENOUS
  Filled 2019-10-11 (×2): qty 0.5

## 2019-10-11 MED ORDER — OLANZAPINE 10 MG IM SOLR
5.0000 mg | Freq: Once | INTRAMUSCULAR | Status: AC
  Administered 2019-10-11: 03:00:00 5 mg via INTRAMUSCULAR
  Filled 2019-10-11: qty 10

## 2019-10-11 MED ORDER — ACETAMINOPHEN 10 MG/ML IV SOLN
1000.0000 mg | Freq: Four times a day (QID) | INTRAVENOUS | Status: DC
  Administered 2019-10-11 – 2019-10-12 (×3): 1000 mg via INTRAVENOUS
  Filled 2019-10-11 (×5): qty 100

## 2019-10-11 MED ORDER — ASPIRIN 300 MG RE SUPP
150.0000 mg | Freq: Every day | RECTAL | Status: DC
  Administered 2019-10-11 – 2019-10-12 (×2): 150 mg via RECTAL
  Filled 2019-10-11 (×3): qty 1

## 2019-10-11 MED ORDER — METOPROLOL TARTRATE 5 MG/5ML IV SOLN
2.5000 mg | Freq: Three times a day (TID) | INTRAVENOUS | Status: DC
  Administered 2019-10-11 – 2019-10-12 (×3): 2.5 mg via INTRAVENOUS
  Filled 2019-10-11 (×3): qty 5

## 2019-10-11 MED ORDER — LEVOTHYROXINE SODIUM 100 MCG/5ML IV SOLN
25.0000 ug | Freq: Every day | INTRAVENOUS | Status: DC
  Administered 2019-10-11 – 2019-10-12 (×2): 25 ug via INTRAVENOUS
  Filled 2019-10-11 (×2): qty 5

## 2019-10-11 MED ORDER — HEPARIN SODIUM (PORCINE) 5000 UNIT/ML IJ SOLN
5000.0000 [IU] | Freq: Three times a day (TID) | INTRAMUSCULAR | Status: DC
  Administered 2019-10-11 – 2019-10-12 (×2): 5000 [IU] via SUBCUTANEOUS
  Filled 2019-10-11 (×2): qty 1

## 2019-10-11 MED ORDER — INSULIN ASPART 100 UNIT/ML ~~LOC~~ SOLN
0.0000 [IU] | SUBCUTANEOUS | Status: DC
  Administered 2019-10-11: 3 [IU] via SUBCUTANEOUS
  Administered 2019-10-11: 2 [IU] via SUBCUTANEOUS
  Administered 2019-10-11 – 2019-10-12 (×2): 9 [IU] via SUBCUTANEOUS
  Administered 2019-10-12 (×2): 5 [IU] via SUBCUTANEOUS

## 2019-10-11 MED ORDER — DEXAMETHASONE SODIUM PHOSPHATE 10 MG/ML IJ SOLN
6.0000 mg | INTRAMUSCULAR | Status: DC
  Administered 2019-10-11 – 2019-10-14 (×4): 6 mg via INTRAVENOUS
  Filled 2019-10-11 (×4): qty 1

## 2019-10-11 NOTE — Progress Notes (Signed)
Lisa [name] had a difficult day today. She was noted to be unarousable upon my entry into her room. She received zyprea overnight at 0238am and from 0730-1030 she was lethargic and unable to open her eyes without a sternal rub. Throughout the day she managed to open her eyes, answer questions including her DOB and day of the week. During the am she was kept NPO due to her mental status and this evening at 1830 she was awake enough to attempt ice chips to which she tolerated without complaint.   She does forget at times her location and situation which she has to be reminded frequently.   Her oxygen status declined at 0730 requiring her to receive both 15l NRB and 6l HFNC. Her oxygen saturation was 98-100% by later afternoon and her oxygen was decreased to 6lpm via HFNC and she was holding her O2 sats at 96%.   Overall her appearance is better than this am.

## 2019-10-11 NOTE — Consult Note (Addendum)
NEURO HOSPITALIST CONSULT NOTE   Requestig physician: Dr. Thereasa Solo  Reason for Consult: AMS in a patient with Covid pneumonia  History obtained from:  Chart     HPI:                                                                                                                                          Lisa [name] is an 74 y.o. female with neuropathy, COPD, CHF (EF 20%), PAD, subclavian steal syndrome and moderate bilateral carotid disease (60-79% 06/2017) who tested positive for Covid on 1/11 after developing a cough, then presented to the ED on Friday 1/15 from home after her PCP observed her to be very SOB during a virtual visit. The fire department was called and reported that the patient's O2 sat was 60% on RA. With a NRB mask at 15 L/min her sats came up to 99%. At her pre-Covid infection baseline she stated that she usually sits at 95% on RA.   While in the ED her oxygen saturation on 3L dropped to 87% requiring an increase to 6L where she was then stable, maintaining an O2 sat of over 92%. She was also complaining of new onset mild mid-back pain that was achy, dull and had begun on 1/14.   She was diagnosed with acute hypoxic respiratory failure secondary to COVID-19 pneumonia, started on remdesivir and Decadron. Her D-dimer was elevated and plane was to consider PE study if D-dimer trended up. Also diagnosed with AKI on CKD stage II. She was continued on her home CHF medication including Lasix, but ARB and spironolactone were to be held due to her AKI. She had frequent PVCs as well as prolonged QTc. Synthroid was continued for her hypothyroidism.   Home meds: Albuterol, allopurinol, ASA, atorvastatin, carvedilol, colchicine, vitamin B12, digoxin, Breo-Ellipta, Norco, synthroid, metformin, nortriptyline, pregabalin, spironolactone, torsemide, Trulicity, Protonix and Tafamidis.   Of note, Tafamidis is used to treat cardiomyopathy caused by transthyretin mediated  amyloidosis (ATTR-CM) - however, this is not listed in her PMHx within Epic.    Overnight, in the setting of hypoxia, she developed agitation and was administered an antipsychotic (Zyprexa) at 2:30 AM. Later this AM, she was somnolent and "not herself" per family. She has gradually improved somewhat since worsened AMS was noted, but is not back to baseline. At her baseline she is cognitively impaired  Apparently, this AM she was oriented only to her name and DOB, but confused to other items, which is worse than her baseline per RN.   She has an AICD, which may preclude obtaining an MRI.    Past Medical History:  Diagnosis Date  . AICD (automatic cardioverter/defibrillator) present 2014   Biotroniks  . Arthritis    knees with arthritis as well as has back,  pt. remarks that her hands fall asleep when she is in the bed or in certain positions   . Bell's palsy   . Carotid artery disease ([place])    a. duplex - moderate bilateral carotid disease (60-79% 06/2017).  . Chronic systolic CHF (congestive heart failure) ([place])   . COPD (chronic obstructive pulmonary disease) ([place])   . COVID-19 10/06/2019   rapid test positive at local urgent care  . Diabetes mellitus without complication (HCC)    borderline  . Diabetic neuropathy ([place]) 05/26/2019  . Dyspnea   . Former tobacco use   . GERD (gastroesophageal reflux disease)    uses otc for occas. heartburn   . Hyperlipidemia   . Hypertension   . Mild CAD    a. nonobstructive CAD (50% PLOM 08/2017).  . Neuropathy   . Non-ischemic cardiomyopathy ([place])   . PAD (peripheral artery disease) ([place])    a.  s/p L-R fem-fem BPG 2016.  [name] Pneumonia    hosp. in [place]-2015  . Subclavian steal syndrome    a. h/o left subclavian steal.    Past Surgical History:  Procedure Laterality Date  . ABDOMINAL AORTAGRAM N/A 11/27/2014   Procedure: ABDOMINAL Maxcine Ham;  Surgeon: Elam Dutch, MD;  Location[facility] C[facility] LAB;  Service: Cardiovascular;  Laterality: N/A;   . ABDOMINAL HYSTERECTOMY    . APPENDECTOMY    . CARDIAC CATHETERIZATION     02/20/13: Normal coronaries, moderate LV dysfunction, EF 35%. Medical RX ([facility])  . CARDIAC DEFIBRILLATOR PLACEMENT  Aug. 2014   dual chamber [employer] ICD 05/06/13 ([facility], Dr. Minna Merritts)  . ENDARTERECTOMY FEMORAL Right 12/21/2014   Procedure: ENDARTERECTOMY FEMORAL;  Surgeon: Elam Dutch, MD;  Location: [place];  Service: Vascular;  Laterality: Right;  . FEMORAL-FEMORAL BYPASS GRAFT Bilateral 12/21/2014   Procedure: BYPASS GRAFT LEFT FEMORAL-RIGHT FEMORAL ARTERY;  Surgeon: Elam Dutch, MD;  Location: [place];  Service: Vascular;  Laterality: Bilateral;  . JOINT REPLACEMENT Bilateral    implants  . RIGHT/LEFT HEART CATH AND CORONARY ANGIOGRAPHY N/A 08/28/2017   Procedure: RIGHT/LEFT HEART CATH AND CORONARY ANGIOGRAPHY;  Surgeon: Larey Dresser, MD;  Location: [place] CV LAB;  Service: Cardiovascular;  Laterality: N/A;  . TUBAL LIGATION      Family History  Problem Relation Age of Onset  . Heart disease Mother   . Hypertension Mother               Social History:  reports that she quit smoking about 8 years ago. Her smoking use included cigarettes. She has never used smokeless tobacco. She reports that she does not drink alcohol or use drugs.  Allergies  Allergen Reactions  . Oxycodone Shortness Of Breath  . Cymbalta [Duloxetine Hcl]     Loopy feeling  . Empagliflozin Itching    Whole body itching  . Entresto [Sacubitril-Valsartan] Other (See Comments)    Impaired coordination---causes patient to drop many things, blood pressure bottomed out  . Nortriptyline     Legs jumping    MEDICATIONS:  Prior to Admission:  Medications Prior to Admission  Medication Sig Dispense Refill Last Dose  . albuterol (PROVENTIL HFA;VENTOLIN HFA) 108 (90 Base) MCG/ACT inhaler Inhale 2 puffs into the lungs  every 6 (six) hours as needed for wheezing or shortness of breath. 1 Inhaler 6 10/10/2019 at Unknown time  . albuterol (PROVENTIL) (2.5 MG/3ML) 0.083% nebulizer solution Take 3 mLs by nebulization every 4 (four) hours as needed for wheezing.   10/10/2019 at Unknown time  . allopurinol (ZYLOPRIM) 100 MG tablet Take 100 mg by mouth daily.   10/10/2019 at Unknown time  . aspirin EC 81 MG tablet Take 162 mg by mouth daily.   10/10/2019 at Unknown time  . atorvastatin (LIPITOR) 20 MG tablet TAKE 1 TABLET BY MOUTH EVERY DAY (Patient taking differently: Take 20 mg by mouth daily at 6 PM. ) 90 tablet 1 10/10/2019 at Unknown time  . carvedilol (COREG) 12.5 MG tablet TAKE 1 TABLET (12.5 MG TOTAL) BY MOUTH 2 (TWO) TIMES DAILY WITH A MEAL. 180 tablet 1 10/10/2019 at 0730  . colchicine 0.6 MG tablet Take 0.6 mg by mouth as needed (gout).    unk  . Cyanocobalamin (VITAMIN B 12 PO) Take by mouth. Gummy   10/09/2019 at Unknown time  . digoxin (LANOXIN) 0.125 MG tablet TAKE 0.5 TABLETS (62.5 MCG TOTAL) BY MOUTH DAILY. (Patient taking differently: Take 0.0625 mg by mouth daily. 1/2 tab daily) 45 tablet 0 10/10/2019 at Unknown time  . fluticasone furoate-vilanterol (BREO ELLIPTA) 200-25 MCG/INH AEPB Inhale 1 puff into the lungs daily.   10/10/2019 at Unknown time  . HYDROcodone-acetaminophen (NORCO) 7.5-325 MG tablet Take 1 tablet by mouth every 6 (six) hours as needed for pain.   10/09/2019 at Unknown time  . levothyroxine (SYNTHROID, LEVOTHROID) 25 MCG tablet Take 25 mcg by mouth daily before breakfast.   10/10/2019 at Unknown time  . losartan (COZAAR) 25 MG tablet TAKE 1/2 TABLET BY MOUTH EVERY DAY (Patient taking differently: Take 12.5 mg by mouth daily. ) 45 tablet 1 10/09/2019 at Unknown time  . metFORMIN (GLUCOPHAGE-XR) 500 MG 24 hr tablet Take 500 mg by mouth 2 (two) times daily.   10/10/2019 at Unknown time  . nortriptyline (PAMELOR) 10 MG capsule Take 10 mg by mouth at bedtime.    10/09/2019 at Unknown time  . pregabalin  (LYRICA) 100 MG capsule Take 100 mg by mouth every evening.    10/09/2019 at Unknown time  . spironolactone (ALDACTONE) 25 MG tablet Take 1 tablet (25 mg total) by mouth daily. 90 tablet 3 10/10/2019 at Unknown time  . torsemide (DEMADEX) 20 MG tablet Take 2 tablets (40 mg total) by mouth daily. 180 tablet 1 10/10/2019 at Unknown time  . TRULICITY 1.5 0000000 SOPN Inject 1.5 mg into the skin once a week. Inject every Friday   10/03/2019  . digoxin (LANOXIN) 0.125 MG tablet TAKE 1/2 TABLET BY MOUTH EVERY DAY (Patient not taking: Reported on 10/10/2019) 45 tablet 0 Not Taking at Unknown time  . pantoprazole (PROTONIX) 40 MG tablet Take 1 tablet (40 mg total) by mouth at bedtime. (Patient not taking: Reported on 10/10/2019) 30 tablet 5 Not Taking at Unknown time  . Tafamidis (VYNDAMAX) 61 MG CAPS Take 61 mg by mouth daily. 30 capsule 11    Scheduled: . aspirin  150 mg Rectal Daily  . dexamethasone (DECADRON) injection  6 mg Intravenous Q24H  . digoxin  0.0625 mg Intravenous Daily  . heparin  5,000 Units Subcutaneous Q8H  . insulin  aspart  0-9 Units Subcutaneous Q4H  . levothyroxine  25 mcg Intravenous Daily  . metoprolol tartrate  2.5 mg Intravenous Q8H   Continuous: . acetaminophen 1,000 mg (10/11/19 1330)  . remdesivir 100 mg in NS 100 mL 100 mg (10/11/19 1020)     ROS:                                                                                                                                       Unable to obtain due to AMS.    Blood pressure (!) 114/96, pulse (!) 102, temperature (!) 102.4 F (39.1 C), temperature source Axillary, resp. rate (!) 28, height 5\' 10"  (1.778 m), weight (!) 177.7 kg, SpO2 91 %.   General Examination:                                                                                                       Physical Exam  HEENT-  [place]/AT. Neck supple    Lungs- On NRB mask  Skin: No rash  Neurological Examination Exam performed with Telemedicine equipment and RN  assistance. Mental Status: Only verbal output is mumbling, with non-discernible words. Opens eyes to name. Does not gaze at RN who helps with exam, except one instance at end of assessment when she is asked to turn her head towards RN on the left side of the bed and then the right. Followed one additional simple command, otherwise not following verbal or pantomimed instructions. Severely impaired attention. Decreased level of alertness, but consistently awake during exam.  Moves upper and lower extremities semipurposefully to sternal rub. Picking movements of right hand and RUE spontaneously noted.  Cranial Nerves: II: PERRL 4 mm in ambient light with brisk pupillary constriction to light. Will fixate but will not track visually. Eyes are conjugate with saccades to left and right present. No forced gaze deviation. No grimace to noxious, but face is symmetric.  III,IV, VI: Saccades to left and right grossly intact with RN assisting in evaluation.  VII: Face symmetric. Did not smile to command.  VIII: hearing intact to some questions.  XI: Grossly symmetric.  XII: Not following command Motor/Sensory: I[[er extremities fall back to bed after passive elevation. Will move BUE spontaneously - picks at railing of bed with right hand, which is in a mitt. Does not move BUE to noxious.  Withdraws BLE weakly to noxious.   Spontaneous movements increase with plantar stimulation bilaterally.  Intermittent tremoring movements of upper extremities.  Deep Tendon  Reflexes: 2+ at triceps bilaterally. Unable to elicit patellar reflexes.  Cerebellar/Gait: Unable to assess   Lab Results: Basic Metabolic Panel: Recent Labs  Lab 10/10/19 1257 10/11/19 0121 10/11/19 1131  NA 134* 134* 137  K 4.3 5.0 4.6  CL 94* 98  --   CO2 26 22  --   GLUCOSE 242* 376*  --   BUN 39* 43*  --   CREATININE 2.05* 1.84*  --   CALCIUM 9.1 8.6*  --   MG  --  1.7  --   PHOS  --  4.6  --     CBC: Recent Labs  Lab 10/10/19 1710  10/11/19 0121 10/11/19 1131  WBC 6.4 6.4  --   NEUTROABS 5.0 5.6  --   HGB 12.4 12.0 13.3  HCT 39.1 38.4 39.0  MCV 102.9* 104.1*  --   PLT 211 170  --     Cardiac Enzymes: No results for input(s): CKTOTAL, CKMB, CKMBINDEX, TROPONINI in the last 168 hours.  Lipid Panel: Recent Labs  Lab 10/10/19 1257  TRIG 102    Imaging: DG Chest Port 1 View  Result Date: 10/10/2019 CLINICAL DATA:  Pt from home, was having a virtual visit with PCP, Doc observed her being very short of breath and called EMS. Pt tested positive 1/11. EXAM: PORTABLE CHEST 1 VIEW COMPARISON:  07/26/2018. FINDINGS: Cardiac silhouette is mildly enlarged. No mediastinal or hilar masses. Lungs demonstrate prominent bronchovascular markings. There is also intervening hazy opacity most evident in right lower lung, more subtly suggested peripherally in the right mid lung and left lung base. No convincing pleural effusion.  No pneumothorax. Left anterior chest wall AICD is stable. Skeletal structures are demineralized but grossly intact. IMPRESSION: 1. Subtle hazy lung opacities are superimposed upon chronically prominent bronchovascular markings, most evident in the right lower lung. Findings are suspicious for multifocal infection. Electronically Signed   By: Lajean Manes M.D.   On: 10/10/2019 13:39    Assessment: 74 year old female with Covid pneumonia and encephalopathy.  1. Gradually improving since earlier this AM per RN.  2. Neurological exam non-lateralizing. Findings most consistent with a diffuse encephalopathy, most likely secondary to residual effect of sedating medication given overnight, possibly on a background of decreased neurological reserve in the setting of infection.   Recommendations: 1. She has an AICD, which may preclude obtaining an MRI.  2. CT head. 3. EEG.  4. Avoid antipsychotics and other sedating medications.     Electronically signed: Dr. Kerney Elbe 10/11/2019, 12:16 PM

## 2019-10-11 NOTE — Evaluation (Signed)
Physical Therapy Evaluation Patient Details Name: Lisa [name] MRN: [id number] DOB: 04/20/1946 Today's Date: 10/11/2019   History of Present Illness  74 y/o female w/ PMHx; PNA, PAD, non ischemic cardiomyopathy, neuropathy, mild CAD, HTN, HLD, GERD, former tobacco use, dyspnea, DM, COPD, chronic systolic CHF, Bell's Palsy, arthritis, AICD, B joint replacements, COVID + 1/11  Clinical Impression   Therapist arrived in room to find nurse in process of arousing pt, seems pt has been attempting to ambulate in room during night and has been medicated. This am she is quite lethargic, minimally able to respond to own name and track therapist from one side of bed to other. PROM in BLE and BUE is functional, pt able to minimally grip hand on command, noted that pt attempted to get up on her on when BP was set to go, needed cues for return to supine position. Rolling left and right to complete hygiene with max x 2, pt minimally able to hold rail to maintain sideling for this. Overall will need to continue to assess as pt becomes more alert and progress with mobility as pt tolerates. Pt was on 15L/min via NRB and sats in 90s w/ RR I 30-40s range. Pt will benefit from continued PT tx while in hospital to progress with mobility, should she be able to progress with mobility close PLOF she may dc back home with family otherwise post acute rehab placement may be needed at dc.     Follow Up Recommendations Other (comment)(TBD pending progress with mobility and tx)    Equipment Recommendations  Other (comment)    Recommendations for Other Services       Precautions / Restrictions Precautions Precautions: Fall Precaution Comments: cognition 02 sats, RR Restrictions Weight Bearing Restrictions: No      Mobility  Bed Mobility Overal bed mobility: Needs Assistance Bed Mobility: Rolling Rolling: Total assist         General bed mobility comments: rollin L/R to clean and reposition with total a,  at one point in assessment pt was noted to attempt and get up when blood pressure cuff was set to go.  Transfers                 General transfer comment: unable to asses sec to level of consciousness  Ambulation/Gait             General Gait Details: unable to assess at this time  Stairs            Wheelchair Mobility    Modified Rankin (Stroke Patients Only)       Balance Overall balance assessment: Needs assistance   Sitting balance-Leahy Scale: Zero       Standing balance-Leahy Scale: Zero                               Pertinent Vitals/Pain Pain Assessment: (responds to noxious stimuli)    Home Living Family/patient expects to be discharged to:: Private residence Living Arrangements: Children               Additional Comments: as per chart pt was living home with daughter, therapist attempted to contact daughter for info on PLOf but was unable to reach her    Prior Function           Comments: unable to reach daughter for [facility] but staff report pt was sedated last night sec to being active and attempting to ambulate in  room     Hand Dominance        Extremity/Trunk Assessment   Upper Extremity Assessment Upper Extremity Assessment: Difficult to assess due to impaired cognition    Lower Extremity Assessment Lower Extremity Assessment: Difficult to assess due to impaired cognition(PROm intact)    Cervical / Trunk Assessment Cervical / Trunk Assessment: Other exceptions(hyper extended, nurse reports this was baseline)  Communication      Cognition Arousal/Alertness: Suspect due to medications(sedated at last shift ) Behavior During Therapy: Restless;Agitated Overall Cognitive Status: Impaired/Different from baseline                                        General Comments      Exercises     Assessment/Plan    PT Assessment Patient needs continued PT services  PT Problem List Decreased  strength;Decreased activity tolerance;Decreased balance;Decreased mobility;Decreased coordination;Decreased knowledge of use of DME;Decreased safety awareness       PT Treatment Interventions Gait training;Functional mobility training;Therapeutic activities;Therapeutic exercise;Balance training;Neuromuscular re-education;Patient/family education    PT Goals (Current goals can be found in the Care Plan section)  Acute Rehab PT Goals Patient Stated Goal: unable to state goals this am Time For Goal Achievement: 10/25/19 Potential to Achieve Goals: Fair    Frequency Min 3X/week   Barriers to discharge        Co-evaluation               AM-PAC PT "6 Clicks" Mobility  Outcome Measure Help needed turning from your back to your side while in a flat bed without using bedrails?: Total Help needed moving from lying on your back to sitting on the side of a flat bed without using bedrails?: Total Help needed moving to and from a bed to a chair (including a wheelchair)?: Total Help needed standing up from a chair using your arms (e.g., wheelchair or bedside chair)?: Total Help needed to walk in hospital room?: Total Help needed climbing 3-5 steps with a railing? : Total 6 Click Score: 6    End of Session Equipment Utilized During Treatment: Oxygen Activity Tolerance: Treatment limited secondary to medical complications (Comment);Patient limited by fatigue;Patient limited by lethargy Patient left: in bed;with call bell/phone within reach;with nursing/sitter in room(physician also in room)   PT Visit Diagnosis: Other abnormalities of gait and mobility (R26.89)    Time: [id number] PT Time Calculation (min) (ACUTE ONLY): 29 min   Charges:   PT Evaluation $PT Eval High Complexity: 1 High          Horald Chestnut, PT   Delford Field 10/11/2019, 1:48 PM

## 2019-10-11 NOTE — Progress Notes (Addendum)
Lisa [name]  [id number] DOB: Feb 09, 1946 DOA: 10/10/2019 PCP: Kelton Pillar, MD    Brief Narrative:  74yo w/ a hx of Systolic CHF ([id number]), AICD, COPD, HTN, and DM2 who presented w/ SOB and was found to have a sat in the 60s on RA.   Significant Events: 1/11 COVID+ 1/15 admit via [place]   COVID-19 specific Treatment: Remdesivir 1/15 > Decadron 1/15 >  Antimicrobials:  none  Subjective: I was called to assess the pt by her RN due to altered mental status this morning. Was given 5mg  Zyprexa IM at ~2:30AM today.  When I enter the room the patient is encephalopathic.  She will open her eyes to my command but will not follow other simple commands.  She does grip my fingers very loosely with both hands when asked.  She will not raise her arms and legs to command but during my time in the room she is noted to attempt to sit up in bed using both her arms and legs.  There are twitching myoclonic type movements of both upper extremities but the patient appears to have voluntary movement of these extremities during this twitching.  Her oxygen saturations are stable as are her blood pressure and heart rate despite her altered mentation.  Assessment & Plan:  COVID Pneumonia - acute hypoxic respiratory failure  Continue Decadron and remdesivir -titrate oxygen as required  Recent Labs  Lab 10/10/19 1257 10/11/19 0121  DDIMER 1.48* 2.49*  FERRITIN 204  --   CRP 13.0* 13.47*  ALT 18 17  PROCALCITON 0.32  --     Altered mental status Differential is broad -ABG not consistent with severe acute hypercarbia - exam is not suggestive of a stroke -?  Covid encephalopathy -?  Febrile encephalopathy -?  Medication effect from Zyprexa -I have asked Neurology to perform a video consultation -at present I am most convinced that this is a result of her Zyprexa dosing -we will avoid using this medication further -monitor very closely -check 123456 and folic acid levels -check TSH -EEG as recommended  by Neurology -attempt to avoid sedatives   Chronic Systolic CHF No gross volume overload on exam with patient in fact appearing mildly dehydrated -avoid volume challenge at this time given significance of her systolic failure -must hold oral medications at present due to her encephalopathy but wish to resume as soon as possible -substitute IV medications as able  AKI on CKD Stage 2 Creatinine trending downward since admission -follow trend  Macrocytosis Check 123456 and folic acid levels  Prolonged QTc Continue to monitor on telemetry -likely related to electrolyte disturbances -supplement electrolytes as indicated  Hypomagnesemia  Supplement with IV magnesium today and recheck in a.m.  DM -uncontrolled with hyperglycemia A1c 8.3 -no hypoglycemia has been noted -presently hyperglycemic with use of steroids -adjust insulin and follow closely  Hypothyroidism  Resume Synthroid as soon as oral intake possible -if not able to tolerate oral intake soon will transition to IV Synthroid  HTN Not an active issue at this time  HLD Unable to take oral medications at present  PVD s/p fem/fem bypass   DVT prophylaxis: SQ heparin  Code Status: FULL CODE Family Communication: call placed to contact number in chart (dgtr) - no answer/VM - did not leave message as was not able to confirm proper emergency contact  Disposition Plan: Progressive care unit  Consultants:  none  Objective: Blood pressure (!) 122/50, pulse 97, temperature 98.3 F (36.8 C), temperature source Axillary, resp.  rate (!) 29, height 5\' 10"  (1.778 m), weight (!) 177.7 kg, SpO2 95 %.  Intake/Output Summary (Last 24 hours) at 10/11/2019 1049 Last data filed at 10/11/2019 0800 Gross per 24 hour  Intake --  Output 551 ml  Net -551 ml   Filed Weights   10/10/19 1152 10/10/19 2228  Weight: 79.4 kg (!) 177.7 kg    Examination: General: No acute respiratory distress Lungs: Clear to auscultation bilaterally without  wheezes or crackles Cardiovascular: Regular rate and rhythm without murmur gallop or rub normal S1 and S2 Abdomen: Nontender, nondistended, soft, bowel sounds positive, no rebound, no ascites, no appreciable mass Extremities: No significant cyanosis, clubbing, or edema bilateral lower extremities Neuro: No facial asymmetry, no apparent cranial nerve deficit though patient cannot fully participate in exam, moves all 4 extremities spontaneously but not to command, no Babinski, does not withdraw from painful stimuli at any of the extremities but does awaken with sternal rub  CBC: Recent Labs  Lab 10/10/19 1710 10/11/19 0121  WBC 6.4 6.4  NEUTROABS 5.0 5.6  HGB 12.4 12.0  HCT 39.1 38.4  MCV 102.9* 104.1*  PLT 211 [id number]   Basic Metabolic Panel: Recent Labs  Lab 10/10/19 1257 10/11/19 0121  NA 134* 134*  K 4.3 5.0  CL 94* 98  CO2 26 22  GLUCOSE 242* 376*  BUN 39* 43*  CREATININE 2.05* 1.84*  CALCIUM 9.1 8.6*  MG  --  1.7  PHOS  --  4.6   GFR: Estimated Creatinine Clearance: 49 mL/min (A) (by C-G formula based on SCr of 1.84 mg/dL (H)).  Liver Function Tests: Recent Labs  Lab 10/10/19 1257 10/11/19 0121  AST 24 28  ALT 18 17  ALKPHOS 52 47  BILITOT 0.8 0.7  PROT 7.9 7.5  ALBUMIN 3.7 3.5    HbA1C: Hgb A1c MFr Bld  Date/Time Value Ref Range Status  10/10/2019 05:10 PM 8.3 (H) 4.8 - 5.6 % Final    Comment:    (NOTE) Pre diabetes:          5.7%-6.4% Diabetes:              >6.4% Glycemic control for   <7.0% adults with diabetes   03/19/2018 04:00 AM 7.0 (H) 4.8 - 5.6 % Final    Comment:    (NOTE) Pre diabetes:          5.7%-6.4% Diabetes:              >6.4% Glycemic control for   <7.0% adults with diabetes     CBG: Recent Labs  Lab 10/10/19 1841 10/10/19 2222  GLUCAP 305* 391*    Recent Results (from the past 240 hour(s))  SARS CORONAVIRUS 2 (TAT 6-24 HRS) Nasopharyngeal Nasopharyngeal Swab     Status: Abnormal   Collection Time: 10/10/19 12:38 PM    Specimen: Nasopharyngeal Swab  Result Value Ref Range Status   SARS Coronavirus 2 POSITIVE (A) NEGATIVE Final    Comment: RESULT CALLED TO, READ BACK BY AND VERIFIED WITH: A.OLEARY RN 2147 10/10/19 MCCORMICK K  (NOTE) SARS-CoV-2 target nucleic acids are DETECTED. The SARS-CoV-2 RNA is generally detectable in upper and lower respiratory specimens during the acute phase of infection. Positive results are indicative of the presence of SARS-CoV-2 RNA. Clinical correlation with patient history and other diagnostic information is  necessary to determine patient infection status. Positive results do not rule out bacterial infection or co-infection with other viruses.  The expected result is Negative. Fact Sheet for Patients:  SugarRoll.be Fact Sheet for Healthcare Providers: https://www.woods-mathews.com/ This test is not yet approved or cleared by the [place] FDA and  has been authorized for detection and/or diagnosis of SARS-CoV-2 by FDA under an Emergency Use Authorization (EUA). This EUA will remain  in effect (meaning this test can be used) for  the duration of the COVID-19 declaration under Section 564(b)(1) of the Act, 21 U.S.C. section 360bbb-3(b)(1), unless the authorization is terminated or revoked sooner. Performed at [facility], [place] [address], [place], [place] [address]   Blood Culture (routine x 2)     Status: None (Preliminary result)   Collection Time: 10/10/19  2:48 PM   Specimen: BLOOD  Result Value Ref Range Status   Specimen Description BLOOD RIGHT ANTECUBITAL  Final   Special Requests   Final    BOTTLES DRAWN AEROBIC AND ANAEROBIC Blood Culture adequate volume   Culture   Final    NO GROWTH < 24 HOURS Performed at [facility], [place] [address], [place], [place] [address]    Report Status PENDING  Incomplete  Blood Culture (routine x 2)     Status: None (Preliminary result)   Collection Time: 10/10/19   5:10 PM   Specimen: BLOOD RIGHT HAND  Result Value Ref Range Status   Specimen Description BLOOD RIGHT HAND  Final   Special Requests   Final    BOTTLES DRAWN AEROBIC AND ANAEROBIC Blood Culture adequate volume   Culture   Final    NO GROWTH < 12 HOURS Performed at [facility], [place] [address], [place], [place] [address]    Report Status PENDING  Incomplete     Scheduled Meds: . allopurinol  100 mg Oral Daily  . aspirin EC  162 mg Oral Daily  . atorvastatin  20 mg Oral q1800  . carvedilol  12.5 mg Oral BID WC  . dexamethasone  6 mg Oral Q24H  . digoxin  0.0625 mg Oral Daily  . fluticasone furoate-vilanterol  1 puff Inhalation Daily  . heparin  5,000 Units Subcutaneous Q12H  . insulin aspart  0-9 Units Subcutaneous TID WC  . levothyroxine  25 mcg Oral Q0600  . nortriptyline  10 mg Oral QHS  . pregabalin  100 mg Oral QPM  . Tafamidis  61 mg Oral Daily  . torsemide  40 mg Oral Daily   Continuous Infusions: . remdesivir 100 mg in NS 100 mL       LOS: 1 day   Cherene Altes, MD [facility] Office  [phone] Pager - Text Page per Amion  If 7PM-7AM, please contact night-coverage per Amion 10/11/2019, 10:49 AM

## 2019-10-12 ENCOUNTER — Other Ambulatory Visit: Payer: Self-pay | Admitting: Neurology

## 2019-10-12 ENCOUNTER — Inpatient Hospital Stay (HOSPITAL_COMMUNITY)
Admit: 2019-10-12 | Discharge: 2019-10-12 | Disposition: A | Discharge status: Home / Self Care | Payer: Medicare Other | Attending: Neurology | Admitting: Neurology

## 2019-10-12 DIAGNOSIS — R4182 Altered mental status, unspecified: Secondary | ICD-10-CM

## 2019-10-12 LAB — CBC WITH DIFFERENTIAL/PLATELET
Abs Immature Granulocytes: 0.03 10*3/uL (ref 0.00–0.07)
Basophils Absolute: 0 10*3/uL (ref 0.0–0.1)
Basophils Relative: 0 %
Eosinophils Absolute: 0 10*3/uL (ref 0.0–0.5)
Eosinophils Relative: 0 %
HCT: 39.9 % (ref 36.0–46.0)
Hemoglobin: 12.3 g/dL (ref 12.0–15.0)
Immature Granulocytes: 0 %
Lymphocytes Relative: 5 %
Lymphs Abs: 0.5 10*3/uL — ABNORMAL LOW (ref 0.7–4.0)
MCH: 32.5 pg (ref 26.0–34.0)
MCHC: 30.8 g/dL (ref 30.0–36.0)
MCV: 105.6 fL — ABNORMAL HIGH (ref 80.0–100.0)
Monocytes Absolute: 0.1 10*3/uL (ref 0.1–1.0)
Monocytes Relative: 1 %
Neutro Abs: 7.8 10*3/uL — ABNORMAL HIGH (ref 1.7–7.7)
Neutrophils Relative %: 94 %
Platelets: 196 10*3/uL (ref 150–400)
RBC: 3.78 MIL/uL — ABNORMAL LOW (ref 3.87–5.11)
RDW: 14.4 % (ref 11.5–15.5)
WBC: 8.4 10*3/uL (ref 4.0–10.5)
nRBC: 0 % (ref 0.0–0.2)

## 2019-10-12 LAB — COMPREHENSIVE METABOLIC PANEL
ALT: 16 U/L (ref 0–44)
AST: 32 U/L (ref 15–41)
Albumin: 3 g/dL — ABNORMAL LOW (ref 3.5–5.0)
Alkaline Phosphatase: 43 U/L (ref 38–126)
Anion gap: 13 (ref 5–15)
BUN: 33 mg/dL — ABNORMAL HIGH (ref 8–23)
CO2: 24 mmol/L (ref 22–32)
Calcium: 8.6 mg/dL — ABNORMAL LOW (ref 8.9–10.3)
Chloride: 99 mmol/L (ref 98–111)
Creatinine, Ser: 1.18 mg/dL — ABNORMAL HIGH (ref 0.44–1.00)
GFR calc Af Amer: 53 mL/min — ABNORMAL LOW (ref >60)
GFR calc non Af Amer: 46 mL/min — ABNORMAL LOW (ref >60)
Glucose, Bld: 309 mg/dL — ABNORMAL HIGH (ref 70–99)
Potassium: 5.4 mmol/L — ABNORMAL HIGH (ref 3.5–5.1)
Sodium: 136 mmol/L (ref 135–145)
Total Bilirubin: 0.7 mg/dL (ref 0.3–1.2)
Total Protein: 7 g/dL (ref 6.5–8.1)

## 2019-10-12 LAB — GLUCOSE, CAPILLARY
Glucose-Capillary: 295 mg/dL — ABNORMAL HIGH (ref 70–99)
Glucose-Capillary: 297 mg/dL — ABNORMAL HIGH (ref 70–99)
Glucose-Capillary: 333 mg/dL — ABNORMAL HIGH (ref 70–99)
Glucose-Capillary: 346 mg/dL — ABNORMAL HIGH (ref 70–99)
Glucose-Capillary: 372 mg/dL — ABNORMAL HIGH (ref 70–99)
Glucose-Capillary: 388 mg/dL — ABNORMAL HIGH (ref 70–99)

## 2019-10-12 LAB — D-DIMER, QUANTITATIVE: D-Dimer, Quant: 3.61 ug/mL-FEU — ABNORMAL HIGH (ref 0.00–0.50)

## 2019-10-12 LAB — TSH: TSH: 0.606 u[IU]/mL (ref 0.350–4.500)

## 2019-10-12 LAB — FOLATE: Folate: 21.1 ng/mL (ref >5.9)

## 2019-10-12 LAB — FERRITIN: Ferritin: 448 ng/mL — ABNORMAL HIGH (ref 11–307)

## 2019-10-12 LAB — MAGNESIUM: Magnesium: 2.7 mg/dL — ABNORMAL HIGH (ref 1.7–2.4)

## 2019-10-12 LAB — PHOSPHORUS: Phosphorus: 3.2 mg/dL (ref 2.5–4.6)

## 2019-10-12 LAB — AMMONIA: Ammonia: 49 umol/L — ABNORMAL HIGH (ref 9–35)

## 2019-10-12 LAB — VITAMIN B12: Vitamin B-12: 1011 pg/mL — ABNORMAL HIGH (ref 180–914)

## 2019-10-12 LAB — C-REACTIVE PROTEIN: CRP: 21.5 mg/dL — ABNORMAL HIGH (ref <1.0)

## 2019-10-12 MED ORDER — ENOXAPARIN SODIUM 100 MG/ML ~~LOC~~ SOLN
90.0000 mg | SUBCUTANEOUS | Status: DC
  Administered 2019-10-12 – 2019-10-20 (×9): 90 mg via SUBCUTANEOUS
  Filled 2019-10-12 (×9): qty 1

## 2019-10-12 MED ORDER — TAFAMIDIS 61 MG PO CAPS
61.0000 mg | ORAL_CAPSULE | Freq: Every day | ORAL | Status: DC
  Filled 2019-10-12 (×2): qty 1

## 2019-10-12 MED ORDER — PREGABALIN 50 MG PO CAPS
100.0000 mg | ORAL_CAPSULE | Freq: Every evening | ORAL | Status: DC
  Administered 2019-10-12 – 2019-10-20 (×9): 100 mg via ORAL
  Filled 2019-10-12 (×10): qty 2

## 2019-10-12 MED ORDER — LEVOTHYROXINE SODIUM 50 MCG PO TABS
25.0000 ug | ORAL_TABLET | Freq: Every day | ORAL | Status: DC
  Administered 2019-10-13 – 2019-10-20 (×7): 25 ug via ORAL
  Filled 2019-10-12 (×7): qty 1

## 2019-10-12 MED ORDER — ALLOPURINOL 100 MG PO TABS
100.0000 mg | ORAL_TABLET | Freq: Every day | ORAL | Status: DC
  Administered 2019-10-12 – 2019-10-20 (×9): 100 mg via ORAL
  Filled 2019-10-12 (×9): qty 1

## 2019-10-12 MED ORDER — PNEUMOCOCCAL VAC POLYVALENT 25 MCG/0.5ML IJ INJ: 0.5000 mL | INJECTION | INTRAMUSCULAR | Status: DC

## 2019-10-12 MED ORDER — INSULIN ASPART 100 UNIT/ML ~~LOC~~ SOLN
0.0000 [IU] | Freq: Three times a day (TID) | SUBCUTANEOUS | Status: DC
  Administered 2019-10-12: 18:00:00 11 [IU] via SUBCUTANEOUS
  Administered 2019-10-12: 15 [IU] via SUBCUTANEOUS
  Administered 2019-10-13: 17:00:00 8 [IU] via SUBCUTANEOUS
  Administered 2019-10-13 – 2019-10-14 (×4): 11 [IU] via SUBCUTANEOUS
  Administered 2019-10-14: 15 [IU] via SUBCUTANEOUS
  Administered 2019-10-15: 08:00:00 11 [IU] via SUBCUTANEOUS
  Administered 2019-10-15: 12:00:00 5 [IU] via SUBCUTANEOUS
  Administered 2019-10-15: 18:00:00 3 [IU] via SUBCUTANEOUS
  Administered 2019-10-16: 15 [IU] via SUBCUTANEOUS
  Administered 2019-10-16: 17:00:00 11 [IU] via SUBCUTANEOUS
  Administered 2019-10-16 – 2019-10-17 (×2): 8 [IU] via SUBCUTANEOUS
  Administered 2019-10-17: 16:00:00 15 [IU] via SUBCUTANEOUS
  Administered 2019-10-17 – 2019-10-18 (×2): 11 [IU] via SUBCUTANEOUS
  Administered 2019-10-18: 08:00:00 8 [IU] via SUBCUTANEOUS
  Administered 2019-10-18 – 2019-10-19 (×3): 15 [IU] via SUBCUTANEOUS
  Administered 2019-10-19: 09:00:00 11 [IU] via SUBCUTANEOUS
  Administered 2019-10-20: 11:00:00 8 [IU] via SUBCUTANEOUS
  Administered 2019-10-20: 08:00:00 5 [IU] via SUBCUTANEOUS
  Administered 2019-10-20: 16:00:00 15 [IU] via SUBCUTANEOUS

## 2019-10-12 MED ORDER — QUETIAPINE FUMARATE 25 MG PO TABS
12.5000 mg | ORAL_TABLET | Freq: Every day | ORAL | Status: DC
  Administered 2019-10-12: 21:00:00 12.5 mg via ORAL
  Filled 2019-10-12: qty 1

## 2019-10-12 MED ORDER — CHLORHEXIDINE GLUCONATE CLOTH 2 % EX PADS
6.0000 | MEDICATED_PAD | Freq: Every day | CUTANEOUS | Status: DC
  Administered 2019-10-12 – 2019-10-19 (×8): 6 via TOPICAL

## 2019-10-12 MED ORDER — CARVEDILOL 12.5 MG PO TABS
12.5000 mg | ORAL_TABLET | Freq: Two times a day (BID) | ORAL | Status: DC
  Filled 2019-10-12: qty 1

## 2019-10-12 MED ORDER — ASPIRIN EC 81 MG PO TBEC
162.0000 mg | DELAYED_RELEASE_TABLET | Freq: Every day | ORAL | Status: DC
  Administered 2019-10-13 – 2019-10-20 (×8): 162 mg via ORAL
  Filled 2019-10-12 (×8): qty 2

## 2019-10-12 MED ORDER — CARVEDILOL 3.125 MG PO TABS
3.1250 mg | ORAL_TABLET | Freq: Two times a day (BID) | ORAL | Status: DC
  Administered 2019-10-13: 08:00:00 3.125 mg via ORAL
  Filled 2019-10-12 (×2): qty 1

## 2019-10-12 MED ORDER — ATORVASTATIN CALCIUM 10 MG PO TABS
20.0000 mg | ORAL_TABLET | Freq: Every day | ORAL | Status: DC
  Administered 2019-10-12 – 2019-10-20 (×9): 20 mg via ORAL
  Filled 2019-10-12 (×9): qty 2

## 2019-10-12 MED ORDER — INFLUENZA VAC A&B SA ADJ QUAD 0.5 ML IM PRSY: 0.5000 mL | PREFILLED_SYRINGE | INTRAMUSCULAR | Status: DC

## 2019-10-12 MED ORDER — PANTOPRAZOLE SODIUM 40 MG PO TBEC
40.0000 mg | DELAYED_RELEASE_TABLET | Freq: Every day | ORAL | Status: DC
  Administered 2019-10-12 – 2019-10-20 (×9): 40 mg via ORAL
  Filled 2019-10-12 (×9): qty 1

## 2019-10-12 MED ORDER — DIGOXIN 0.0625 MG HALF TABLET
0.0625 mg | ORAL_TABLET | Freq: Every day | ORAL | Status: DC
  Administered 2019-10-13 – 2019-10-20 (×8): 0.0625 mg via ORAL
  Filled 2019-10-12 (×9): qty 1

## 2019-10-12 MED ORDER — INSULIN GLARGINE 100 UNIT/ML ~~LOC~~ SOLN
14.0000 [IU] | Freq: Every day | SUBCUTANEOUS | Status: DC
  Administered 2019-10-12 – 2019-10-13 (×2): 14 [IU] via SUBCUTANEOUS
  Filled 2019-10-12 (×2): qty 0.14

## 2019-10-12 MED ORDER — FLUTICASONE FUROATE-VILANTEROL 200-25 MCG/INH IN AEPB
1.0000 | INHALATION_SPRAY | Freq: Every day | RESPIRATORY_TRACT | Status: DC
  Administered 2019-10-12 – 2019-10-20 (×7): 1 via RESPIRATORY_TRACT
  Filled 2019-10-12 (×2): qty 28

## 2019-10-12 MED ORDER — LINAGLIPTIN 5 MG PO TABS
5.0000 mg | ORAL_TABLET | Freq: Every day | ORAL | Status: DC
  Administered 2019-10-12 – 2019-10-20 (×9): 5 mg via ORAL
  Filled 2019-10-12 (×9): qty 1

## 2019-10-12 MED ORDER — INSULIN ASPART 100 UNIT/ML ~~LOC~~ SOLN
0.0000 [IU] | Freq: Every day | SUBCUTANEOUS | Status: DC
  Administered 2019-10-12: 21:00:00 4 [IU] via SUBCUTANEOUS
  Administered 2019-10-13: 21:00:00 3 [IU] via SUBCUTANEOUS
  Administered 2019-10-14 – 2019-10-15 (×2): 5 [IU] via SUBCUTANEOUS
  Administered 2019-10-16: 22:00:00 2 [IU] via SUBCUTANEOUS
  Administered 2019-10-17: 20:00:00 5 [IU] via SUBCUTANEOUS
  Administered 2019-10-19: 21:00:00 4 [IU] via SUBCUTANEOUS

## 2019-10-12 NOTE — Progress Notes (Signed)
Pt impulsive and restless this A.M. Redirects easily. New orders received. Sitter now at bedside. Pt remains on 15L HFNC. Vitals stable. Maintaining good PO intake. Will continue plan of care.

## 2019-10-12 NOTE — Progress Notes (Signed)
EEG complete - results pending 

## 2019-10-12 NOTE — Progress Notes (Signed)
Lisa [name]  [id number] DOB: 09/04/1946 DOA: 10/10/2019 PCP: Kelton Pillar, MD    Brief Narrative:  74yo w/ a hx of Systolic CHF ([id number]), AICD, COPD, HTN, and DM2 who presented w/ SOB and was found to have a sat in the 60s on RA.   Significant Events: 1/11 COVID+ 1/15 admit via [place]  1/16 severe encephalopathy -Neurology consultation -EEG without evidence of seizure  COVID-19 specific Treatment: Remdesivir 1/15 > Decadron 1/15 >  Antimicrobials:  none  Subjective: The patient is dramatically improved today.  She is mildly confused but is able to tell me who she is and where she is.  She has been impulsive frequently attempting to get out of bed.  We have therefore arranged for a one-to-one sitter.  She herself denies chest pain nausea vomiting or significant shortness of breath at present.  She does however continue to require high flow nasal cannula at 15 L for support.  Assessment & Plan:  COVID Pneumonia - acute hypoxic respiratory failure  Continue Decadron and remdesivir - titrate oxygen as required -encouraged recruitment maneuvers - appears there is room to titrate oxygen downward at present -I have personally reviewed her CXR from 1/15  Recent Labs  Lab 10/10/19 1257 10/11/19 0121 10/11/19 0810 10/12/19 0530  DDIMER 1.48* 2.49*  --  3.61*  FERRITIN 204  --  286 448*  CRP 13.0* 13.47*  --  21.5*  ALT 18 17  --  16  PROCALCITON 0.32  --   --   --     Altered mental status Differential is broad -ABG not consistent with severe acute hypercarbia - exam is not suggestive of a stroke -?Covid encephalopathy - ?Febrile encephalopathy -?medication effect from Zyprexa -patient much improved today  -suspect this is primarily due to Zyprexa dosing with what appears to be an acute component of sundowning -123456 not low -folic acid pending - TSH is normal - ammonia is modestly elevated but clinically I do not suspect this is significant -neurology also felt this was  likely medication effect -EEG without evidence of seizure   Chronic Systolic CHF No gross volume overload on exam -net negative approximately 400 cc since admission -resume usual oral medications with exception to diuretics today -clinic note from Dr. Benjamine Mola reviewed  AKI on CKD Stage 2 Creatinine trending downward since admission -continue to follow trend  Recent Labs  Lab 10/10/19 1257 10/11/19 0121 10/12/19 0530  CREATININE 2.05* 1.84* 1.18*    Macrocytosis Folate pending - B12 not low  Prolonged QTc Continue to monitor on telemetry -likely related to electrolyte disturbances -magnesium and potassium now optimized  Hypomagnesemia  Magnesium significantly improved with supplementation -recheck in a.m.  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 - she has peripheral neuropathy - genetic testing negative, wild-type - continue tafamidis now that able to tolerate oral intake  HTN Not an active issue at this time - holding diuretic in setting of mild hypotension  HLD Resuming usual home medical treatment  PVD s/p fem/fem bypass - PAD  DVT prophylaxis: SQ heparin  Code Status: FULL CODE Family Communication: call placed to contact number in chart again 1/17 (dgtr) - no answer/VM - did not leave message as was not able to confirm proper emergency contact  Disposition  Plan: Progressive care unit  Consultants:  none  Objective: Blood pressure (!) 107/49, pulse 79, temperature 97.7 F (36.5 C), temperature source Axillary, resp. rate 20, height 5\' 10"  (1.778 m), weight (!) 177.7 kg, SpO2 99 %.  Intake/Output Summary (Last 24 hours) at 10/12/2019 0923 Last data filed at 10/12/2019 0600 Gross per 24 hour  Intake 1500 ml    Output 1350 ml  Net 150 ml   Filed Weights   10/10/19 1152 10/10/19 2228  Weight: 79.4 kg (!) 177.7 kg    Examination: General: No acute respiratory distress Lungs: Fine crackles most prominent in bases with no wheezing Cardiovascular: Regular rate and rhythm without rub Abdomen: NT/ND, soft, no mass, no rebound, BS positive Extremities: No edema bilateral lower extremities Neuro: Much more alert, oriented to person and place, no facial asymmetry, moves all 4 extremities spontaneously  CBC: Recent Labs  Lab 10/10/19 1710 10/10/19 1710 10/11/19 0121 10/11/19 1131 10/12/19 0530  WBC 6.4  --  6.4  --  8.4  NEUTROABS 5.0  --  5.6  --  7.8*  HGB 12.4   < > 12.0 13.3 12.3  HCT 39.1   < > 38.4 39.0 39.9  MCV 102.9*  --  104.1*  --  105.6*  PLT 211  --  170  --  196   < > = values in this interval not displayed.   Basic Metabolic Panel: Recent Labs  Lab 10/10/19 1257 10/10/19 1257 10/11/19 0121 10/11/19 1131 10/12/19 0530  NA 134*   < > 134* 137 136  K 4.3   < > 5.0 4.6 5.4*  CL 94*  --  98  --  99  CO2 26  --  22  --  24  GLUCOSE 242*  --  376*  --  309*  BUN 39*  --  43*  --  33*  CREATININE 2.05*  --  1.84*  --  1.18*  CALCIUM 9.1  --  8.6*  --  8.6*  MG  --   --  1.7  --  2.7*  PHOS  --   --  4.6  --  3.2   < > = values in this interval not displayed.   GFR: Estimated Creatinine Clearance: 76.3 mL/min (A) (by C-G formula based on SCr of 1.18 mg/dL (H)).  Liver Function Tests: Recent Labs  Lab 10/10/19 1257 10/11/19 0121 10/12/19 0530  AST 24 28 32  ALT 18 17 16   ALKPHOS 52 47 43  BILITOT 0.8 0.7 0.7  PROT 7.9 7.5 7.0  ALBUMIN 3.7 3.5 3.0*    HbA1C: Hgb A1c MFr Bld  Date/Time Value Ref Range Status  10/10/2019 05:10 PM 8.3 (H) 4.8 - 5.6 % Final    Comment:    (NOTE) Pre diabetes:          5.7%-6.4% Diabetes:              >6.4% Glycemic control for   <7.0% adults with diabetes   03/19/2018 04:00 AM 7.0 (H) 4.8 - 5.6 % Final    Comment:     (NOTE) Pre diabetes:          5.7%-6.4% Diabetes:              >6.4% Glycemic control for   <7.0% adults with diabetes     CBG: Recent Labs  Lab 10/11/19 1124 10/11/19 1556 10/11/19 1949 10/11/19 2351 10/12/19 0353  GLUCAP 348* 229* 195* 297* 295*    Recent Results (  from the past 240 hour(s))  SARS CORONAVIRUS 2 (TAT 6-24 HRS) Nasopharyngeal Nasopharyngeal Swab     Status: Abnormal   Collection Time: 10/10/19 12:38 PM   Specimen: Nasopharyngeal Swab  Result Value Ref Range Status   SARS Coronavirus 2 POSITIVE (A) NEGATIVE Final    Comment: RESULT CALLED TO, READ BACK BY AND VERIFIED WITH: A.OLEARY RN 2147 10/10/19 MCCORMICK K  (NOTE) SARS-CoV-2 target nucleic acids are DETECTED. The SARS-CoV-2 RNA is generally detectable in upper and lower respiratory specimens during the acute phase of infection. Positive results are indicative of the presence of SARS-CoV-2 RNA. Clinical correlation with patient history and other diagnostic information is  necessary to determine patient infection status. Positive results do not rule out bacterial infection or co-infection with other viruses.  The expected result is Negative. Fact Sheet for Patients: SugarRoll.be Fact Sheet for Healthcare Providers: https://www.woods-mathews.com/ This test is not yet approved or cleared by the [place] FDA and  has been authorized for detection and/or diagnosis of SARS-CoV-2 by FDA under an Emergency Use Authorization (EUA). This EUA will remain  in effect (meaning this test can be used) for  the duration of the COVID-19 declaration under Section 564(b)(1) of the Act, 21 U.S.C. section 360bbb-3(b)(1), unless the authorization is terminated or revoked sooner. Performed at [facility], [place] [address], [place], [place] [address]   Blood Culture (routine x 2)     Status: None (Preliminary result)   Collection Time: 10/10/19  2:48 PM   Specimen: BLOOD    Result Value Ref Range Status   Specimen Description BLOOD RIGHT ANTECUBITAL  Final   Special Requests   Final    BOTTLES DRAWN AEROBIC AND ANAEROBIC Blood Culture adequate volume   Culture   Final    NO GROWTH 2 DAYS Performed at [facility], [place] [address] [place], [place] [address]    Report Status PENDING  Incomplete  Blood Culture (routine x 2)     Status: None (Preliminary result)   Collection Time: 10/10/19  5:10 PM   Specimen: BLOOD RIGHT HAND  Result Value Ref Range Status   Specimen Description BLOOD RIGHT HAND  Final   Special Requests   Final    BOTTLES DRAWN AEROBIC AND ANAEROBIC Blood Culture adequate volume   Culture   Final    NO GROWTH 2 DAYS Performed at [facility], [place] [address] [place], [place] [address]    Report Status PENDING  Incomplete     Scheduled Meds: . aspirin  150 mg Rectal Daily  . dexamethasone (DECADRON) injection  6 mg Intravenous Q24H  . digoxin  0.0625 mg Intravenous Daily  . heparin  5,000 Units Subcutaneous Q8H  . insulin aspart  0-9 Units Subcutaneous Q4H  . levothyroxine  25 mcg Intravenous Daily  . metoprolol tartrate  2.5 mg Intravenous Q8H  . QUEtiapine  12.5 mg Oral QHS   Continuous Infusions: . remdesivir 100 mg in NS 100 mL 100 mg (10/11/19 1020)     LOS: 2 days   Cherene Altes, MD [facility] Office  [phone] Pager - Text Page per Shea Evans  If 7PM-7AM, please contact night-coverage per Amion 10/12/2019, 9:23 AM

## 2019-10-12 NOTE — Progress Notes (Signed)
Attempted to update pt's family x2. Messages left for both daughters Freda Munro and Dimitri)

## 2019-10-12 NOTE — Procedures (Signed)
Patient Name: Lisa [name]  MRN: [id number]  Epilepsy Attending: Lora Havens  Referring Physician/Provider: Dr Einar Gip Date: 10/12/2019 Duration: 23.34 mins  Patient history: 74yo COVID female with worsening ams. EEG to evaluate for seizure.  Level of alertness: awake  AEDs during EEG study: None  Technical aspects: This EEG study was done with scalp electrodes positioned according to the 10-20 International system of electrode placement. Electrical activity was acquired at a sampling rate of 500Hz  and reviewed with a high frequency filter of 70Hz  and a low frequency filter of 1Hz . EEG data were recorded continuously and digitally stored.   DESCRIPTION: No clear posterior dominant rhythm was seen. EEG showed continuous generalized 3-5Hz  theta-delta slowing. Hyperventilation and photic stimulation were not performed.  Of note, eeg was difficult to interpret dur to significant movement artifact.   ABNORMALITY - Continuous slow, generalized   IMPRESSION: This technically difficult study is suggestive of moderate diffuse encephalopathy, non specific to etiology.No seizures or epileptiform discharges were seen throughout the recording.  Ilean Spradlin Barbra Sarks

## 2019-10-12 NOTE — Progress Notes (Signed)
Pt is currently on SQ heparin for DVT px. She is morbidly obese and scr is down to 1.18. Ok to change to Lovenox 0.5mg /kg/day per Dr. Thereasa Solo.   Onnie Boer, PharmD, BCIDP, AAHIVP, CPP Infectious Disease Pharmacist 10/12/2019 10:38 AM

## 2019-10-12 NOTE — Progress Notes (Addendum)
Pt did not require restraints this shift. Redirectable with sitter at bedside. Vitals stable, denies pain and O2 remains at 14L HFNC. Update given to Adela Lank (daughter) by phone.

## 2019-10-13 DIAGNOSIS — R0902 Hypoxemia: Secondary | ICD-10-CM

## 2019-10-13 LAB — FERRITIN: Ferritin: 520 ng/mL — ABNORMAL HIGH (ref 11–307)

## 2019-10-13 LAB — CBC WITH DIFFERENTIAL/PLATELET
Abs Immature Granulocytes: 0.05 10*3/uL (ref 0.00–0.07)
Basophils Absolute: 0 10*3/uL (ref 0.0–0.1)
Basophils Relative: 0 %
Eosinophils Absolute: 0 10*3/uL (ref 0.0–0.5)
Eosinophils Relative: 0 %
HCT: 41.3 % (ref 36.0–46.0)
Hemoglobin: 13.2 g/dL (ref 12.0–15.0)
Immature Granulocytes: 1 %
Lymphocytes Relative: 4 %
Lymphs Abs: 0.4 10*3/uL — ABNORMAL LOW (ref 0.7–4.0)
MCH: 32.6 pg (ref 26.0–34.0)
MCHC: 32 g/dL (ref 30.0–36.0)
MCV: 102 fL — ABNORMAL HIGH (ref 80.0–100.0)
Monocytes Absolute: 0.2 10*3/uL (ref 0.1–1.0)
Monocytes Relative: 2 %
Neutro Abs: 10 10*3/uL — ABNORMAL HIGH (ref 1.7–7.7)
Neutrophils Relative %: 93 %
Platelets: 271 10*3/uL (ref 150–400)
RBC: 4.05 MIL/uL (ref 3.87–5.11)
RDW: 14.2 % (ref 11.5–15.5)
WBC: 10.7 10*3/uL — ABNORMAL HIGH (ref 4.0–10.5)
nRBC: 0 % (ref 0.0–0.2)

## 2019-10-13 LAB — GLUCOSE, CAPILLARY
Glucose-Capillary: 271 mg/dL — ABNORMAL HIGH (ref 70–99)
Glucose-Capillary: 284 mg/dL — ABNORMAL HIGH (ref 70–99)
Glucose-Capillary: 320 mg/dL — ABNORMAL HIGH (ref 70–99)
Glucose-Capillary: 352 mg/dL — ABNORMAL HIGH (ref 70–99)
Glucose-Capillary: 306 mg/dL — ABNORMAL HIGH (ref 70–99)
Glucose-Capillary: 298 mg/dL — ABNORMAL HIGH (ref 70–99)
Glucose-Capillary: 262 mg/dL — ABNORMAL HIGH (ref 70–99)

## 2019-10-13 LAB — MAGNESIUM: Magnesium: 2.8 mg/dL — ABNORMAL HIGH (ref 1.7–2.4)

## 2019-10-13 LAB — COMPREHENSIVE METABOLIC PANEL
ALT: 17 U/L (ref 0–44)
AST: 30 U/L (ref 15–41)
Albumin: 3.2 g/dL — ABNORMAL LOW (ref 3.5–5.0)
Alkaline Phosphatase: 51 U/L (ref 38–126)
Anion gap: 8 (ref 5–15)
BUN: 29 mg/dL — ABNORMAL HIGH (ref 8–23)
CO2: 28 mmol/L (ref 22–32)
Calcium: 9.1 mg/dL (ref 8.9–10.3)
Chloride: 102 mmol/L (ref 98–111)
Creatinine, Ser: 1.01 mg/dL — ABNORMAL HIGH (ref 0.44–1.00)
GFR calc Af Amer: >60 mL/min (ref >60)
GFR calc non Af Amer: 56 mL/min — ABNORMAL LOW (ref >60)
Glucose, Bld: 292 mg/dL — ABNORMAL HIGH (ref 70–99)
Potassium: 5.5 mmol/L — ABNORMAL HIGH (ref 3.5–5.1)
Sodium: 138 mmol/L (ref 135–145)
Total Bilirubin: 0.5 mg/dL (ref 0.3–1.2)
Total Protein: 7.5 g/dL (ref 6.5–8.1)

## 2019-10-13 LAB — PHOSPHORUS: Phosphorus: 2.1 mg/dL — ABNORMAL LOW (ref 2.5–4.6)

## 2019-10-13 LAB — D-DIMER, QUANTITATIVE: D-Dimer, Quant: 2.44 ug/mL-FEU — ABNORMAL HIGH (ref 0.00–0.50)

## 2019-10-13 LAB — FOLATE: Folate: 16 ng/mL (ref >5.9)

## 2019-10-13 LAB — RPR: RPR Ser Ql: NONREACTIVE

## 2019-10-13 LAB — C-REACTIVE PROTEIN: CRP: 18.4 mg/dL — ABNORMAL HIGH (ref <1.0)

## 2019-10-13 MED ORDER — INSULIN GLARGINE 100 UNIT/ML ~~LOC~~ SOLN
14.0000 [IU] | Freq: Two times a day (BID) | SUBCUTANEOUS | Status: DC
  Administered 2019-10-13 – 2019-10-14 (×2): 14 [IU] via SUBCUTANEOUS
  Filled 2019-10-13 (×3): qty 0.14

## 2019-10-13 MED ORDER — INSULIN ASPART 100 UNIT/ML ~~LOC~~ SOLN
5.0000 [IU] | Freq: Three times a day (TID) | SUBCUTANEOUS | Status: DC
  Administered 2019-10-13 – 2019-10-18 (×14): 5 [IU] via SUBCUTANEOUS

## 2019-10-13 MED ORDER — ACETAMINOPHEN 325 MG PO TABS
650.0000 mg | ORAL_TABLET | Freq: Four times a day (QID) | ORAL | Status: DC | PRN
  Administered 2019-10-13 – 2019-10-20 (×6): 650 mg via ORAL
  Filled 2019-10-13 (×6): qty 2

## 2019-10-13 MED ORDER — TAFAMIDIS MEGLUMINE (CARDIAC) 20 MG PO CAPS
80.0000 mg | ORAL_CAPSULE | Freq: Every day | ORAL | Status: DC
  Filled 2019-10-13: qty 4

## 2019-10-13 NOTE — Progress Notes (Addendum)
Patient assessed, she is resting in bed, confused but easily reoriented, vital signs are stable, tele monitor active, she is currently on 12L HFNC, sats stable mid 90's. Blood sugar obtained, she is complaining of pain by pointing to her stomach region. Tylenol administered. She is sitting up in bed, able to swallow crushed meds in applesauce. Sitter at the bedside, update on plan of care for the evening. Will continue to monitor for acute changes.   Noted from the sitter that the patient had dispelled some blood tinged sputum from coughing, not sure if this is an acute change in her condition but relayed findings to the night provider.   Order received for prn guaifenesin

## 2019-10-13 NOTE — Progress Notes (Signed)
Pharmacy Medication Storage Note  Storing home medications for [name] in pharmacy secured storage.   Medication storage bag number: [id number]  Delivered to pharmacy @ 1400 10/13/2019  Medications will be returned to patient/caregiver upon discharge.  Onnie Boer 10/13/19 3:31 PM

## 2019-10-13 NOTE — Progress Notes (Signed)
Lisa [name]  [id number] DOB: Sep 27, 1945 DOA: 10/10/2019 PCP: Kelton Pillar, MD    Brief Narrative:  74yo w/ a hx of Systolic CHF ([id number]), AICD, COPD, HTN, and DM2 who presented w/ SOB and was found to have a sat in the 60s on RA.   Significant Events: 1/11 COVID+ 1/15 admit via [place]  1/16 severe encephalopathy -Neurology consultation -EEG without evidence of seizure  COVID-19 specific Treatment: Remdesivir 1/15 > Decadron 1/15 >  Antimicrobials:  none  Subjective: Continues to require high level oxygen support at 15 L HFNC but saturations are in the mid to upper 90s.  Blood pressure and heart rate are stable.  Patient is afebrile.  CRP and D-dimer are trending downward. Much more alert, but remains confused. Does not appear uncomfortable. In no resp distress.   Assessment & Plan:  COVID Pneumonia - acute hypoxic respiratory failure  Continue Decadron and remdesivir - titrate oxygen as required -encouraged recruitment maneuvers - consider early discontinuation of steroid if O2 requirement improves significantly as it is likely contributing to her delirium   Recent Labs  Lab 10/10/19 1257 10/11/19 0121 10/11/19 0810 10/12/19 0530 10/13/19 0120  DDIMER 1.48* 2.49*  --  3.61* 2.44*  FERRITIN 204  --  286 448* 520*  CRP 13.0* 13.47*  --  21.5* 18.4*  ALT 18 17  --  16 17  PROCALCITON 0.32  --   --   --   --     Altered mental status Differential is broad -ABG not consistent with severe acute hypercarbia - exam is not suggestive of a stroke -?Covid encephalopathy - ?Febrile encephalopathy -?medication effect from Zyprexa - suspect this is primarily due to Zyprexa dosing with what appears to be an acute component of sundowning - B12 and folate not low - TSH is normal - ammonia is modestly elevated but clinically I do not suspect this is significant - Neurology also felt this was likely medication effect - EEG without evidence of seizure - stop all sedatives as able  - continue sitter - ?underlying component of mild dementia   Chronic Severe Systolic CHF No gross volume overload on exam - net negative approximately 200 cc since admission - resumed usual oral medications with exception to diuretics -clinic note from Dr. Benjamine Mola reviewed  AKI on CKD Stage 2 Creatinine trending downward since admission - continue to follow trend  Recent Labs  Lab 10/10/19 1257 10/11/19 0121 10/12/19 0530 10/13/19 0120  CREATININE 2.05* 1.84* 1.18* 1.01*    Macrocytosis B12 and folate not low   Prolonged QTc Continue to monitor on telemetry -likely related to electrolyte disturbances -magnesium and potassium now optimized  Hypomagnesemia  Magnesium significantly improved with supplementation -recheck in a.m.  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 - she has peripheral neuropathy - genetic testing negative, wild-type - continue tafamidis now that able to tolerate oral intake  HTN Not an active issue at this time - holding diuretic at present w/ limited intake   HLD Resumed usual home medical treatment  PVD s/p fem/fem bypass - PAD  DVT prophylaxis: SQ heparin  Code Status: FULL CODE Family Communication: call placed to contact number in chart again 1/17 (dgtr) - no answer/VM - did not leave message as was not able to confirm proper emergency contact  Disposition Plan: Progressive care unit  Consultants:  Neurology   Objective: Blood pressure 96/73, pulse (!) 103, temperature (!) 97.2 F (36.2 C), temperature source Axillary, resp. rate 20, height 5\' 10"  (1.778 m), weight (!) 177.7 kg, SpO2 91 %.  Intake/Output Summary (Last 24 hours) at 10/13/2019 1012 Last data filed at 10/13/2019 0500 Gross per 24 hour  Intake 1100 ml  Output 1550 ml  Net -450  ml   Filed Weights   10/10/19 1152 10/10/19 2228  Weight: 79.4 kg (!) 177.7 kg    Examination: General: No acute respiratory distress Lungs: Fine crackles B - no wheezing  Cardiovascular: RRR - no M  Abdomen: NT/ND, soft, no mass, no rebound, BS positive Extremities: No edema B LE  Neuro: Much more alert, oriented to person and place, no facial asymmetry, moves all 4 extremities spontaneously  CBC: Recent Labs  Lab 10/11/19 0121 10/11/19 0121 10/11/19 1131 10/12/19 0530 10/13/19 0120  WBC 6.4  --   --  8.4 10.7*  NEUTROABS 5.6  --   --  7.8* 10.0*  HGB 12.0   < > 13.3 12.3 13.2  HCT 38.4   < > 39.0 39.9 41.3  MCV 104.1*  --   --  105.6* 102.0*  PLT 170  --   --  196 271   < > = values in this interval not displayed.   Basic Metabolic Panel: Recent Labs  Lab 10/11/19 0121 10/11/19 0121 10/11/19 1131 10/12/19 0530 10/13/19 0120  NA 134*   < > 137 136 138  K 5.0   < > 4.6 5.4* 5.5*  CL 98  --   --  99 102  CO2 22  --   --  24 28  GLUCOSE 376*  --   --  309* 292*  BUN 43*  --   --  33* 29*  CREATININE 1.84*  --   --  1.18* 1.01*  CALCIUM 8.6*  --   --  8.6* 9.1  MG 1.7  --   --  2.7* 2.8*  PHOS 4.6  --   --  3.2 2.1*   < > = values in this interval not displayed.   GFR: Estimated Creatinine Clearance: 89.2 mL/min (A) (by C-G formula based on SCr of 1.01 mg/dL (H)).  Liver Function Tests: Recent Labs  Lab 10/10/19 1257 10/11/19 0121 10/12/19 0530 10/13/19 0120  AST 24 28 32 30  ALT 18 17 16 17   ALKPHOS 52 47 43 51  BILITOT 0.8 0.7 0.7 0.5  PROT 7.9 7.5 7.0 7.5  ALBUMIN 3.7 3.5 3.0* 3.2*    HbA1C: Hgb A1c MFr Bld  Date/Time Value Ref Range Status  10/10/2019 05:10 PM 8.3 (H) 4.8 - 5.6 % Final    Comment:    (NOTE) Pre diabetes:          5.7%-6.4% Diabetes:              >6.4% Glycemic control for   <7.0% adults with diabetes   03/19/2018 04:00 AM 7.0 (H) 4.8 - 5.6 % Final    Comment:    (NOTE) Pre diabetes:          5.7%-6.4% Diabetes:               >6.4% Glycemic control for   <7.0% adults with diabetes     CBG: Recent Labs  Lab 10/12/19 1642 10/12/19 1959 10/12/19 2350 10/13/19 0348 10/13/19 0725  GLUCAP 333* 346* 271* 284* 320*  Recent Results (from the past 240 hour(s))  SARS CORONAVIRUS 2 (TAT 6-24 HRS) Nasopharyngeal Nasopharyngeal Swab     Status: Abnormal   Collection Time: 10/10/19 12:38 PM   Specimen: Nasopharyngeal Swab  Result Value Ref Range Status   SARS Coronavirus 2 POSITIVE (A) NEGATIVE Final    Comment: RESULT CALLED TO, READ BACK BY AND VERIFIED WITH: A.OLEARY RN 2147 10/10/19 MCCORMICK K  (NOTE) SARS-CoV-2 target nucleic acids are DETECTED. The SARS-CoV-2 RNA is generally detectable in upper and lower respiratory specimens during the acute phase of infection. Positive results are indicative of the presence of SARS-CoV-2 RNA. Clinical correlation with patient history and other diagnostic information is  necessary to determine patient infection status. Positive results do not rule out bacterial infection or co-infection with other viruses.  The expected result is Negative. Fact Sheet for Patients: SugarRoll.be Fact Sheet for Healthcare Providers: https://www.woods-mathews.com/ This test is not yet approved or cleared by the [place] FDA and  has been authorized for detection and/or diagnosis of SARS-CoV-2 by FDA under an Emergency Use Authorization (EUA). This EUA will remain  in effect (meaning this test can be used) for  the duration of the COVID-19 declaration under Section 564(b)(1) of the Act, 21 U.S.C. section 360bbb-3(b)(1), unless the authorization is terminated or revoked sooner. Performed at [facility], [place] [address], [place], [place] [address]   Blood Culture (routine x 2)     Status: None (Preliminary result)   Collection Time: 10/10/19  2:48 PM   Specimen: BLOOD  Result Value Ref Range Status   Specimen Description  BLOOD RIGHT ANTECUBITAL  Final   Special Requests   Final    BOTTLES DRAWN AEROBIC AND ANAEROBIC Blood Culture adequate volume   Culture   Final    NO GROWTH 2 DAYS Performed at [facility], [place] [address], [place], [place] [address]    Report Status PENDING  Incomplete  Blood Culture (routine x 2)     Status: None (Preliminary result)   Collection Time: 10/10/19  5:10 PM   Specimen: BLOOD RIGHT HAND  Result Value Ref Range Status   Specimen Description BLOOD RIGHT HAND  Final   Special Requests   Final    BOTTLES DRAWN AEROBIC AND ANAEROBIC Blood Culture adequate volume   Culture   Final    NO GROWTH 2 DAYS Performed at [facility], [place] [address], [place],  [address]    Report Status PENDING  Incomplete     Scheduled Meds: . allopurinol  100 mg Oral Daily  . aspirin EC  162 mg Oral Daily  . atorvastatin  20 mg Oral q1800  . carvedilol  3.125 mg Oral BID WC  . Chlorhexidine Gluconate Cloth  6 each Topical Daily  . dexamethasone (DECADRON) injection  6 mg Intravenous Q24H  . digoxin  0.0625 mg Oral Daily  . enoxaparin (LOVENOX) injection  90 mg Subcutaneous Q24H  . fluticasone furoate-vilanterol  1 puff Inhalation Daily  . insulin aspart  0-15 Units Subcutaneous TID WC  . insulin aspart  0-5 Units Subcutaneous QHS  . insulin glargine  14 Units Subcutaneous Daily  . levothyroxine  25 mcg Oral Q0600  . linagliptin  5 mg Oral Daily  . pantoprazole  40 mg Oral Daily  . pregabalin  100 mg Oral QPM  . QUEtiapine  12.5 mg Oral QHS  . Tafamidis  61 mg Oral Daily   Continuous Infusions: . remdesivir 100 mg in NS  100 mL 100 mg (10/13/19 0825)     LOS: 3 days   Cherene Altes, MD [facility] Office  [phone] Pager - Text Page per Amion  If 7PM-7AM, please contact night-coverage per Amion 10/13/2019, 10:12 AM

## 2019-10-13 NOTE — Progress Notes (Signed)
Spoke with Dr. Thereasa Solo. Patient is much improved. Encephalopathy was likely multifactorial including hospitalization, covid infection, hypoxemia, and medication effect.   Neurology will be available as needed, please call with questions or concerns.   Roland Rack, MD [facility] [phone]  If 7pm- 7am, please page neurology on call as listed in [place].

## 2019-10-13 NOTE — Progress Notes (Signed)
Rn called and said that patient can't swallow unless the tafamidis capsule is open. It can't be so we are going to stop it per Dr. Thereasa Solo.   Onnie Boer, PharmD, BCIDP, AAHIVP, CPP Infectious Disease Pharmacist 10/13/2019 5:46 PM

## 2019-10-13 NOTE — Progress Notes (Signed)
Inpatient Diabetes Program Recommendations  AACE/ADA: New Consensus Statement on Inpatient Glycemic Control (2015)  Target Ranges:  Prepandial:   less than 140 mg/dL      Peak postprandial:   less than 180 mg/dL (1-2 hours)      Critically ill patients:  140 - 180 mg/dL   Lab Results  Component Value Date   GLUCAP 320 (H) 10/13/2019   HGBA1C 8.3 (H) 10/10/2019    Review of Glycemic Control Results for LILLEY, FLEEGE (MRN [id number]) as of 10/13/2019 10:30  Ref. Range 10/12/2019 23:50 10/13/2019 03:48 10/13/2019 07:25  Glucose-Capillary Latest Ref Range: 70 - 99 mg/dL 271 (H) 284 (H) 320 (H)   Diabetes history: Type 2 Dm Outpatient Diabetes medications: Metformin [id number] mg BID, Trulicity 1.5 mg Qwk,  Current orders for Inpatient glycemic control: Novolog 0-5 units QHS, Novolog 0-15 units TID, Lantus 14 units QD, Tradjenta 5 mg QD Decadron 6 mg QD  Inpatient Diabetes Program Recommendations:    Consider increasing Lantus to 14 units BID and adding Novolog 5 units TID (Assuming patient is consuming >50% of meal).   Thanks, Bronson Curb, MSN, RNC-OB Diabetes Coordinator [phone] (8a-5p)

## 2019-10-14 ENCOUNTER — Inpatient Hospital Stay (HOSPITAL_COMMUNITY): Payer: Medicare Other

## 2019-10-14 LAB — CBC WITH DIFFERENTIAL/PLATELET
Abs Immature Granulocytes: 0.04 10*3/uL (ref 0.00–0.07)
Basophils Absolute: 0 10*3/uL (ref 0.0–0.1)
Basophils Relative: 0 %
Eosinophils Absolute: 0 10*3/uL (ref 0.0–0.5)
Eosinophils Relative: 0 %
HCT: 45.2 % (ref 36.0–46.0)
Hemoglobin: 14.2 g/dL (ref 12.0–15.0)
Immature Granulocytes: 0 %
Lymphocytes Relative: 5 %
Lymphs Abs: 0.5 10*3/uL — ABNORMAL LOW (ref 0.7–4.0)
MCH: 32.9 pg (ref 26.0–34.0)
MCHC: 31.4 g/dL (ref 30.0–36.0)
MCV: 104.9 fL — ABNORMAL HIGH (ref 80.0–100.0)
Monocytes Absolute: 0.2 10*3/uL (ref 0.1–1.0)
Monocytes Relative: 2 %
Neutro Abs: 8.7 10*3/uL — ABNORMAL HIGH (ref 1.7–7.7)
Neutrophils Relative %: 93 %
Platelets: 333 10*3/uL (ref 150–400)
RBC: 4.31 MIL/uL (ref 3.87–5.11)
RDW: 14.6 % (ref 11.5–15.5)
WBC: 9.4 10*3/uL (ref 4.0–10.5)
nRBC: 0 % (ref 0.0–0.2)

## 2019-10-14 LAB — COMPREHENSIVE METABOLIC PANEL
ALT: 20 U/L (ref 0–44)
AST: 29 U/L (ref 15–41)
Albumin: 3.5 g/dL (ref 3.5–5.0)
Alkaline Phosphatase: 66 U/L (ref 38–126)
Anion gap: 12 (ref 5–15)
BUN: 40 mg/dL — ABNORMAL HIGH (ref 8–23)
CO2: 26 mmol/L (ref 22–32)
Calcium: 9.5 mg/dL (ref 8.9–10.3)
Chloride: 105 mmol/L (ref 98–111)
Creatinine, Ser: 1.22 mg/dL — ABNORMAL HIGH (ref 0.44–1.00)
GFR calc Af Amer: 51 mL/min — ABNORMAL LOW (ref >60)
GFR calc non Af Amer: 44 mL/min — ABNORMAL LOW (ref >60)
Glucose, Bld: 302 mg/dL — ABNORMAL HIGH (ref 70–99)
Potassium: 5.7 mmol/L — ABNORMAL HIGH (ref 3.5–5.1)
Sodium: 143 mmol/L (ref 135–145)
Total Bilirubin: 0.6 mg/dL (ref 0.3–1.2)
Total Protein: 8.3 g/dL — ABNORMAL HIGH (ref 6.5–8.1)

## 2019-10-14 LAB — DIGOXIN LEVEL: Digoxin Level: <0.2 ng/mL — ABNORMAL LOW (ref 0.8–2.0)

## 2019-10-14 LAB — GLUCOSE, CAPILLARY
Glucose-Capillary: 256 mg/dL — ABNORMAL HIGH (ref 70–99)
Glucose-Capillary: 317 mg/dL — ABNORMAL HIGH (ref 70–99)
Glucose-Capillary: 319 mg/dL — ABNORMAL HIGH (ref 70–99)
Glucose-Capillary: 349 mg/dL — ABNORMAL HIGH (ref 70–99)
Glucose-Capillary: 386 mg/dL — ABNORMAL HIGH (ref 70–99)
Glucose-Capillary: 397 mg/dL — ABNORMAL HIGH (ref 70–99)

## 2019-10-14 LAB — D-DIMER, QUANTITATIVE: D-Dimer, Quant: 2.59 ug/mL-FEU — ABNORMAL HIGH (ref 0.00–0.50)

## 2019-10-14 LAB — FERRITIN: Ferritin: 481 ng/mL — ABNORMAL HIGH (ref 11–307)

## 2019-10-14 LAB — C-REACTIVE PROTEIN: CRP: 8.9 mg/dL — ABNORMAL HIGH (ref <1.0)

## 2019-10-14 LAB — MAGNESIUM: Magnesium: 3.1 mg/dL — ABNORMAL HIGH (ref 1.7–2.4)

## 2019-10-14 LAB — PHOSPHORUS: Phosphorus: 3.7 mg/dL (ref 2.5–4.6)

## 2019-10-14 MED ORDER — CARVEDILOL 12.5 MG PO TABS
12.5000 mg | ORAL_TABLET | Freq: Two times a day (BID) | ORAL | Status: DC
  Administered 2019-10-14 – 2019-10-17 (×7): 12.5 mg via ORAL
  Filled 2019-10-14 (×7): qty 1

## 2019-10-14 MED ORDER — GUAIFENESIN-DM 100-10 MG/5ML PO SYRP
5.0000 mL | ORAL_SOLUTION | ORAL | Status: DC | PRN
  Administered 2019-10-14 – 2019-10-18 (×4): 5 mL via ORAL
  Filled 2019-10-14 (×4): qty 5

## 2019-10-14 MED ORDER — CARVEDILOL 3.125 MG PO TABS
6.2500 mg | ORAL_TABLET | Freq: Once | ORAL | Status: AC
  Administered 2019-10-14: 09:00:00 6.25 mg via ORAL
  Filled 2019-10-14: qty 2

## 2019-10-14 MED ORDER — FUROSEMIDE 10 MG/ML IJ SOLN
40.0000 mg | Freq: Every day | INTRAMUSCULAR | Status: DC
  Administered 2019-10-14 – 2019-10-18 (×5): 40 mg via INTRAVENOUS
  Filled 2019-10-14 (×5): qty 4

## 2019-10-14 MED ORDER — INSULIN DETEMIR 100 UNIT/ML ~~LOC~~ SOLN
20.0000 [IU] | Freq: Two times a day (BID) | SUBCUTANEOUS | Status: DC
  Administered 2019-10-14 – 2019-10-16 (×4): 20 [IU] via SUBCUTANEOUS
  Filled 2019-10-14 (×5): qty 0.2

## 2019-10-14 NOTE — Progress Notes (Addendum)
Lisa [name]  [id number] DOB: April 18, 1946 DOA: 10/10/2019 PCP: Kelton Pillar, MD    Brief Narrative:  74yo w/ a hx of Systolic CHF ([id number]), AICD, COPD, HTN, and DM2 who presented w/ SOB and was found to have a sat in the 60s on RA.   Significant Events: 1/11 COVID+ 1/15 admit via [place]  1/16 severe encephalopathy -Neurology consultation -EEG without evidence of seizure  COVID-19 specific Treatment: Remdesivir 1/15 > 1/19 Decadron 1/15 >   Antimicrobials:  none  Subjective: Remains delirious but much more alert and interactive.  Continues to require high flow nasal cannula in excess of 10 L.  Blood pressure trending up significantly today.  Family confirmed to me yesterday that the patient had absolutely no cognitive deficits prior to this admission.  She was fully independent prior to this illness.  Assessment & Plan:  COVID Pneumonia - acute hypoxic respiratory failure  Continue Decadron and remdesivir - titrate oxygen as required -encouraged recruitment maneuvers - consider early discontinuation of steroid if O2 requirement improves significantly as it is likely contributing to her delirium -some progress has been made today in weaning her oxygen support - resume partial dose of her home diuretic   Recent Labs  Lab 10/10/19 1257 10/11/19 0121 10/11/19 0810 10/12/19 0530 10/13/19 0120 10/14/19 0511  DDIMER 1.48* 2.49*  --  3.61* 2.44* 2.59*  FERRITIN 204  --  286 448* 520* 481*  CRP 13.0* 13.47*  --  21.5* 18.4* 8.9*  ALT 18 17  --  16 17 20   PROCALCITON 0.32  --   --   --   --   --     Altered mental status Differential is broad -ABG not consistent with severe acute hypercarbia - exam is not suggestive of a stroke -?Covid encephalopathy - ?Febrile encephalopathy -?medication effect from Zyprexa - suspect this is primarily due to Zyprexa dosing with what appears to be an acute component of sundowning - B12 and folate not low - TSH is normal - ammonia is  modestly elevated but clinically I do not suspect this is significant - Neurology also felt this was likely medication effect - EEG without evidence of seizure - stopped all sedatives - continue sitter - no cognitive issues at baseline per family - suspect sundowning playing a role   Chronic Severe Systolic CHF No gross volume overload on exam - net negative approximately 1200 cc since admission - resumed usual oral medications, and partial dose of her usual diuretic -clinic notes from Dr. Benjamine Mola reviewed  AKI on CKD Stage 2 Cont to monitor creatinine - diuretic resumed at lower dose in setting of poor intake   Recent Labs  Lab 10/10/19 1257 10/11/19 0121 10/12/19 0530 10/13/19 0120 10/14/19 0511  CREATININE 2.05* 1.84* 1.18* 1.01* 1.22*    Macrocytosis B12 and folate not low   Prolonged QTc Continue to monitor on telemetry -likely related to electrolyte disturbances -magnesium and potassium now optimized  DM - uncontrolled with hyperglycemia A1c 8.3 - no hypoglycemia has been noted - CBG trending upward - adjust tx again today and follow    Hypothyroidism  Cont usual home Synthroid dose  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 rate 18, height 5\' 10"  (1.778 m), weight (!) 177.7 kg, SpO2 98 %.  Intake/Output Summary (Last 24 hours) at 10/14/2019 0907 Last data filed at 10/14/2019 0900 Gross per 24 hour  Intake  690 ml  Output 1150 ml  Net -460 ml   Filed Weights   10/10/19 1152 10/10/19 2228  Weight: 79.4 kg (!) 177.7 kg    Examination: General: No acute respiratory distress - delirious - no distress otherwise  Lungs: Fine crackles B w/o change   Cardiovascular: RRR w/o M or rub  Abdomen: NT/ND, soft, no mass, no rebound Extremities: No signif edema B LE  Neuro: alert, no facial asymmetry, moves all 4 extremities spontaneously, does not follow simple commands   CBC: Recent Labs  Lab 10/12/19 0530 10/13/19 0120 10/14/19 0511  WBC 8.4 10.7* 9.4  NEUTROABS 7.8* 10.0* 8.7*  HGB 12.3 13.2 14.2  HCT 39.9 41.3 45.2  MCV 105.6* 102.0* 104.9*  PLT 196 271 0000000   Basic Metabolic Panel: Recent Labs  Lab 10/12/19 0530 10/13/19 0120 10/14/19 0511  NA 136 138 143  K 5.4* 5.5* 5.7*  CL 99 102 105  CO2 24 28 26   GLUCOSE 309* 292* 302*  BUN 33* 29* 40*  CREATININE 1.18* 1.01* 1.22*  CALCIUM 8.6* 9.1 9.5  MG 2.7* 2.8* 3.1*  PHOS 3.2 2.1* 3.7   GFR: Estimated Creatinine Clearance: 73.8 mL/min (A) (by C-G formula based on SCr of 1.22 mg/dL (H)).  Liver Function Tests: Recent Labs  Lab 10/11/19 0121 10/12/19 0530 10/13/19 0120 10/14/19 0511  AST 28 32 30 29  ALT 17 16 17 20   ALKPHOS 47 43 51 66  BILITOT 0.7 0.7 0.5 0.6  PROT 7.5 7.0 7.5 8.3*  ALBUMIN 3.5 3.0* 3.2* 3.5    HbA1C: Hgb A1c MFr Bld  Date/Time Value Ref Range Status  10/10/2019 05:10 PM 8.3 (H) 4.8 - 5.6 % Final    Comment:    (NOTE) Pre diabetes:          5.7%-6.4% Diabetes:              >6.4% Glycemic control for   <7.0% adults with diabetes   03/19/2018 04:00 AM 7.0 (H) 4.8 - 5.6 % Final    Comment:    (NOTE) Pre diabetes:          5.7%-6.4% Diabetes:              >6.4% Glycemic control for   <7.0% adults with diabetes     CBG: Recent Labs  Lab 10/13/19 1616 10/13/19 1956 10/14/19 0012 10/14/19 0422 10/14/19 0753  GLUCAP 298* 262* 256* 319* 317*    Recent Results (from the past 240  hour(s))  SARS CORONAVIRUS 2 (TAT 6-24 HRS) Nasopharyngeal Nasopharyngeal Swab     Status: Abnormal   Collection Time: 10/10/19 12:38 PM   Specimen: Nasopharyngeal Swab  Result Value Ref Range Status   SARS Coronavirus 2 POSITIVE (A) NEGATIVE Final    Comment: RESULT CALLED TO, READ BACK BY AND VERIFIED WITH: A.OLEARY RN 2147 10/10/19 MCCORMICK K  (NOTE) SARS-CoV-2 target nucleic acids are DETECTED. The SARS-CoV-2 RNA is generally detectable in upper and lower respiratory specimens during the acute phase of infection. Positive results are indicative of  the presence of SARS-CoV-2 RNA. Clinical correlation with patient history and other diagnostic information is  necessary to determine patient infection status. Positive results do not rule out bacterial infection or co-infection with other viruses.  The expected result is Negative. Fact Sheet for Patients: SugarRoll.be Fact Sheet for Healthcare Providers: https://www.woods-mathews.com/ This test is not yet approved or cleared by the [place] FDA and  has been authorized for detection and/or diagnosis of SARS-CoV-2 by FDA under an Emergency Use Authorization (EUA). This EUA will remain  in effect (meaning this test can be used) for  the duration of the COVID-19 declaration under Section 564(b)(1) of the Act, 21 U.S.C. section 360bbb-3(b)(1), unless the authorization is terminated or revoked sooner. Performed at [facility], [place] [address], [place], [place] [address]   Blood Culture (routine x 2)     Status: None (Preliminary result)   Collection Time: 10/10/19  2:48 PM   Specimen: BLOOD  Result Value Ref Range Status   Specimen Description BLOOD RIGHT ANTECUBITAL  Final   Special Requests   Final    BOTTLES DRAWN AEROBIC AND ANAEROBIC Blood Culture adequate volume   Culture   Final    NO GROWTH 4 DAYS Performed at [facility], [place] [address] [place], [place] [address]      Report Status PENDING  Incomplete  Blood Culture (routine x 2)     Status: None (Preliminary result)   Collection Time: 10/10/19  5:10 PM   Specimen: BLOOD RIGHT HAND  Result Value Ref Range Status   Specimen Description BLOOD RIGHT HAND  Final   Special Requests   Final    BOTTLES DRAWN AEROBIC AND ANAEROBIC Blood Culture adequate volume   Culture   Final    NO GROWTH 4 DAYS Performed at [facility], [place] [address], [place],  [address]    Report Status PENDING  Incomplete     Scheduled Meds: . allopurinol  100 mg Oral Daily  . aspirin EC  162 mg Oral Daily  . atorvastatin  20 mg Oral q1800  . carvedilol  12.5 mg Oral BID WC  . Chlorhexidine Gluconate Cloth  6 each Topical Daily  . dexamethasone (DECADRON) injection  6 mg Intravenous Q24H  . digoxin  0.0625 mg Oral Daily  . enoxaparin (LOVENOX) injection  90 mg Subcutaneous Q24H  . fluticasone furoate-vilanterol  1 puff Inhalation Daily  . insulin aspart  0-15 Units Subcutaneous TID WC  . insulin aspart  0-5 Units Subcutaneous QHS  . insulin aspart  5 Units Subcutaneous TID WC  . insulin glargine  14 Units Subcutaneous BID  . levothyroxine  25 mcg Oral Q0600  . linagliptin  5 mg Oral Daily  . pantoprazole  40 mg Oral Daily  . pregabalin  100 mg Oral QPM   Continuous Infusions: . remdesivir 100 mg in NS 100 mL 100 mg (10/13/19 0825)     LOS: 4 days   Cherene Altes, MD [facility] Office  [phone] Pager - Text Page per Shea Evans  If 7PM-7AM, please contact night-coverage per Amion 10/14/2019, 9:07 AM

## 2019-10-14 NOTE — Progress Notes (Signed)
Physical Therapy Treatment Patient Details Name: Lisa [name] MRN: [id number] DOB: 07-Oct-1945 Today's Date: 10/14/2019    History of Present Illness 74 y/o female w/ PMHx; PNA, PAD, non ischemic cardiomyopathy, neuropathy, mild CAD, HTN, HLD, GERD, former tobacco use, dyspnea, DM, COPD, chronic systolic CHF, Bell's Palsy, arthritis, AICD, B joint replacements, COVID + 1/11    PT Comments    Pt did much better with tx this day than previous session. Pt was able to tolerate some mobility and sit edge of bed for a few minutes. Noted pt was quite shaky and self soothing. She was able to get to edge of bed with mod assist, attempted to stand several times on her own but was not quite able to, was able to stand x 3 with mod a and hand held assist and took few steps at edge of bed to scoot up in bed.     Follow Up Recommendations  Supervision/Assistance - 24 hour     Equipment Recommendations       Recommendations for Other Services       Precautions / Restrictions Precautions Precautions: Fall Restrictions Weight Bearing Restrictions: No    Mobility  Bed Mobility Overal bed mobility: Needs Assistance Bed Mobility: Supine to Sit;Sit to Supine Rolling: Mod assist   Supine to sit: Mod assist Sit to supine: Mod assist   General bed mobility comments: was able to get to edge of bed and sit supported, pt was noted to attempt to stand several times but not very sucessful unless assisted  Transfers Overall transfer level: Needs assistance   Transfers: Sit to/from Stand Sit to Stand: Mod assist         General transfer comment: was able to stand x 3 with mod a and hand held assist x 3 and take few side steps to reposition in bed. difficult to assess 02 sats as pt tends to lean on or lay on probe and getting poor reading, pt did have episode of coughing and produced some dark red sputum   Ambulation/Gait                 Stairs             Wheelchair  Mobility    Modified Rankin (Stroke Patients Only)       Balance Overall balance assessment: Needs assistance Sitting-balance support: Feet supported;Bilateral upper extremity supported Sitting balance-Leahy Scale: Fair   Postural control: (noted to be tripoding at edge of bed) Standing balance support: During functional activity;Bilateral upper extremity supported Standing balance-Leahy Scale: Poor                              Cognition Arousal/Alertness: Lethargic Behavior During Therapy: Anxious;Agitated Overall Cognitive Status: Impaired/Different from baseline Area of Impairment: Orientation;Attention;Memory;Following commands;Safety/judgement;Awareness;Problem solving                 Orientation Level: Disoriented to;Place;Time;Situation   Memory: Decreased recall of precautions;Decreased short-term memory Following Commands: Follows one step commands inconsistently;Follows one step commands with increased time Safety/Judgement: Decreased awareness of safety;Decreased awareness of deficits   Problem Solving: Decreased initiation;Difficulty sequencing;Requires verbal cues;Requires tactile cues        Exercises      General Comments        Pertinent Vitals/Pain Pain Assessment: No/denies pain(but noted to be shaky and self soothing)    Home Living  Prior Function            PT Goals (current goals can now be found in the care plan section) Acute Rehab PT Goals Patient Stated Goal: answers questions posed but does not voice much else Time For Goal Achievement: 10/25/19 Potential to Achieve Goals: Fair Progress towards PT goals: Progressing toward goals    Frequency    Min 3X/week      PT Plan Discharge plan needs to be updated    Co-evaluation              AM-PAC PT "6 Clicks" Mobility   Outcome Measure  Help needed turning from your back to your side while in a flat bed without using  bedrails?: A Little Help needed moving from lying on your back to sitting on the side of a flat bed without using bedrails?: A Lot Help needed moving to and from a bed to a chair (including a wheelchair)?: A Lot Help needed standing up from a chair using your arms (e.g., wheelchair or bedside chair)?: A Lot Help needed to walk in hospital room?: Total Help needed climbing 3-5 steps with a railing? : Total 6 Click Score: 11    End of Session Equipment Utilized During Treatment: Oxygen Activity Tolerance: Patient limited by fatigue;Patient limited by lethargy;Treatment limited secondary to medical complications (Comment) Patient left: in bed;with call bell/phone within reach;with nursing/sitter in room Nurse Communication: Other (comment)(nurse was in room during tx) PT Visit Diagnosis: Other abnormalities of gait and mobility (R26.89)     Time: [id number] PT Time Calculation (min) (ACUTE ONLY): 16 min  Charges:  $Therapeutic Activity: 8-22 mins                     Horald Chestnut, PT    Delford Field 10/14/2019, 1:04 PM

## 2019-10-14 NOTE — Progress Notes (Signed)
Inpatient Diabetes Program Recommendations  AACE/ADA: New Consensus Statement on Inpatient Glycemic Control (2015)  Target Ranges:  Prepandial:   less than 140 mg/dL      Peak postprandial:   less than 180 mg/dL (1-2 hours)      Critically ill patients:  140 - 180 mg/dL   Lab Results  Component Value Date   GLUCAP 397 (H) 10/14/2019   HGBA1C 8.3 (H) 10/10/2019    Review of Glycemic Control  Results for EKAM, KUI (MRN [id number]) as of 10/14/2019 14:33  Ref. Range 10/13/2019 19:56 10/14/2019 00:12 10/14/2019 04:22 10/14/2019 07:53 10/14/2019 12:14  Glucose-Capillary Latest Ref Range: 70 - 99 mg/dL 262 (H) 256 (H) 319 (H) 317 (H) 397 (H)   Diabetes history: DM 2 Outpatient Diabetes medications:  Metformin XX123456 mg bid, Trulicity 1.5 weekly Current orders for Inpatient glycemic control:  Novolog moderate tid with meals and HS Decadron 6 mg IV q 24 hours Lantus 14 units bid Novolog 5 units tid with meals Tradjenta 5 mg daily  Inpatient Diabetes Program Recommendations:    May consider d/c of Lantus and add Levemir 20 units bid.   Thanks  Adah Perl, RN, BC-ADM Inpatient Diabetes Coordinator Pager [phone] (8a-5p)

## 2019-10-14 NOTE — Progress Notes (Addendum)
Patient alert but not oriented, she is resting in bed, sitter present in the room. Vitals obtained, 108/95, HR 67, temp 97.3, she is on HFNC 7L sats upper 90's. Patient has been reoriented to situation, but remains confused, pulling at her foley, and trying to climb out the bed. Bed alarm is active, sitter updated on plan of care for the evening and patients condition remains unchanged from yesterday evening.   Family member Shelia called for update on the patient.

## 2019-10-14 NOTE — Progress Notes (Signed)
OT Cancellation Note  Patient Details Name: MARITERE BRUNJES MRN: [id number] DOB: 1946-07-31   Cancelled Treatment:    Reason Eval/Treat Not Completed: Medical issues which prohibited therapy. Attempted to see pt this morning. Pt in bed with sitter present. Per Retail banker, pt has been attempting to sleep but has been restless. Pt moaning and reaching for sitters hand. Pt stating that she has a headache. BP in RUE 183/163mmHg. BP taken again in RUE 2 minutes later 219/256mmHg. Placed BP cuff on LUE with BP reading 257/257mmHg. RN notified. RN and OT in agreement to hold therapy at this time. OT will continue to follow acutely.   Mauri Brooklyn 10/14/2019, 8:03 AM

## 2019-10-15 DIAGNOSIS — J1282 Pneumonia due to coronavirus disease 2019: Secondary | ICD-10-CM

## 2019-10-15 DIAGNOSIS — E1165 Type 2 diabetes mellitus with hyperglycemia: Secondary | ICD-10-CM

## 2019-10-15 DIAGNOSIS — J9601 Acute respiratory failure with hypoxia: Secondary | ICD-10-CM

## 2019-10-15 LAB — COMPREHENSIVE METABOLIC PANEL
ALT: 20 U/L (ref 0–44)
AST: 24 U/L (ref 15–41)
Albumin: 3.4 g/dL — ABNORMAL LOW (ref 3.5–5.0)
Alkaline Phosphatase: 65 U/L (ref 38–126)
Anion gap: 10 (ref 5–15)
BUN: 42 mg/dL — ABNORMAL HIGH (ref 8–23)
CO2: 33 mmol/L — ABNORMAL HIGH (ref 22–32)
Calcium: 9.5 mg/dL (ref 8.9–10.3)
Chloride: 100 mmol/L (ref 98–111)
Creatinine, Ser: 1.14 mg/dL — ABNORMAL HIGH (ref 0.44–1.00)
GFR calc Af Amer: 56 mL/min — ABNORMAL LOW (ref >60)
GFR calc non Af Amer: 48 mL/min — ABNORMAL LOW (ref >60)
Glucose, Bld: 290 mg/dL — ABNORMAL HIGH (ref 70–99)
Potassium: 5.2 mmol/L — ABNORMAL HIGH (ref 3.5–5.1)
Sodium: 143 mmol/L (ref 135–145)
Total Bilirubin: 0.7 mg/dL (ref 0.3–1.2)
Total Protein: 8.1 g/dL (ref 6.5–8.1)

## 2019-10-15 LAB — CULTURE, BLOOD (ROUTINE X 2)
Culture: NO GROWTH
Culture: NO GROWTH
Special Requests: ADEQUATE
Special Requests: ADEQUATE

## 2019-10-15 LAB — CBC WITH DIFFERENTIAL/PLATELET
Abs Immature Granulocytes: 0.04 10*3/uL (ref 0.00–0.07)
Basophils Absolute: 0 10*3/uL (ref 0.0–0.1)
Basophils Relative: 0 %
Eosinophils Absolute: 0 10*3/uL (ref 0.0–0.5)
Eosinophils Relative: 0 %
HCT: 45.2 % (ref 36.0–46.0)
Hemoglobin: 14 g/dL (ref 12.0–15.0)
Immature Granulocytes: 1 %
Lymphocytes Relative: 5 %
Lymphs Abs: 0.5 10*3/uL — ABNORMAL LOW (ref 0.7–4.0)
MCH: 32.3 pg (ref 26.0–34.0)
MCHC: 31 g/dL (ref 30.0–36.0)
MCV: 104.4 fL — ABNORMAL HIGH (ref 80.0–100.0)
Monocytes Absolute: 0.3 10*3/uL (ref 0.1–1.0)
Monocytes Relative: 3 %
Neutro Abs: 8 10*3/uL — ABNORMAL HIGH (ref 1.7–7.7)
Neutrophils Relative %: 91 %
Platelets: 370 10*3/uL (ref 150–400)
RBC: 4.33 MIL/uL (ref 3.87–5.11)
RDW: 14.2 % (ref 11.5–15.5)
WBC: 8.8 10*3/uL (ref 4.0–10.5)
nRBC: 0 % (ref 0.0–0.2)

## 2019-10-15 LAB — URINALYSIS, ROUTINE W REFLEX MICROSCOPIC
Bilirubin Urine: NEGATIVE
Glucose, UA: >=500 mg/dL — AB
Ketones, ur: NEGATIVE mg/dL
Nitrite: NEGATIVE
Protein, ur: NEGATIVE mg/dL
Specific Gravity, Urine: 1.007 (ref 1.005–1.030)
pH: 6 (ref 5.0–8.0)

## 2019-10-15 LAB — AMMONIA: Ammonia: 43 umol/L — ABNORMAL HIGH (ref 9–35)

## 2019-10-15 LAB — D-DIMER, QUANTITATIVE: D-Dimer, Quant: 2.64 ug/mL-FEU — ABNORMAL HIGH (ref 0.00–0.50)

## 2019-10-15 LAB — GLUCOSE, CAPILLARY
Glucose-Capillary: 307 mg/dL — ABNORMAL HIGH (ref 70–99)
Glucose-Capillary: 249 mg/dL — ABNORMAL HIGH (ref 70–99)
Glucose-Capillary: 179 mg/dL — ABNORMAL HIGH (ref 70–99)
Glucose-Capillary: 407 mg/dL — ABNORMAL HIGH (ref 70–99)

## 2019-10-15 LAB — MAGNESIUM: Magnesium: 2.6 mg/dL — ABNORMAL HIGH (ref 1.7–2.4)

## 2019-10-15 LAB — PHOSPHORUS: Phosphorus: 4.6 mg/dL (ref 2.5–4.6)

## 2019-10-15 LAB — FERRITIN: Ferritin: 302 ng/mL (ref 11–307)

## 2019-10-15 LAB — C-REACTIVE PROTEIN: CRP: 5.1 mg/dL — ABNORMAL HIGH (ref <1.0)

## 2019-10-15 MED ORDER — DEXAMETHASONE SODIUM PHOSPHATE 4 MG/ML IJ SOLN
4.0000 mg | INTRAMUSCULAR | Status: DC
  Administered 2019-10-15 – 2019-10-17 (×3): 4 mg via INTRAVENOUS
  Filled 2019-10-15 (×4): qty 1

## 2019-10-15 MED ORDER — HALOPERIDOL LACTATE 5 MG/ML IJ SOLN
2.0000 mg | Freq: Once | INTRAMUSCULAR | Status: AC
  Administered 2019-10-15: 2 mg via INTRAVENOUS
  Filled 2019-10-15: qty 1

## 2019-10-15 MED ORDER — SODIUM CHLORIDE 0.9 % IV SOLN
1.0000 g | INTRAVENOUS | Status: AC
  Administered 2019-10-15 – 2019-10-18 (×5): 1 g via INTRAVENOUS
  Filled 2019-10-15 (×4): qty 10

## 2019-10-15 NOTE — Progress Notes (Signed)
Called Lisa [name] twice this am and no answer her phone would not accept any messages.  Spoke to pt other daughter Lisa [name].  She was inquiring about her mothers mentation and I explained that pt continues to need sitter due to pt trying to climb out of bed and pt trying to scratch and swing at staff.  I explained that pt was not restrained and that we apply soft mitts to keep pt from removing IVs and taking off her heart monitor.  Per Lisa [name] this is not the norm for her mom and she became tearful that she's in this state She began to blame the medication that was given to her mom Sunday Night.  The Dr on call yesterday called family and told them, per [facility], that this medication hit her like a ton of bricks.  Rather than focus on medication given Sunday night I explained that pts Covid Positive can become confused at times. I then asked to call the Dr and have him call [name].  Dr Maryland Pink called daughter  Lisa [name] I dont know how the conversation went.  [name] called me back and said that she was gonna try and come up to see her mom.  I explained this was as Covid unit with Positive patients and Im pretty sure they would not allow visitation I then explained the staff would really appreciate family being here to aide in pt progression

## 2019-10-15 NOTE — Progress Notes (Signed)
0800Intial Assessment Completed.  Pt alert but more confused this am.  Pt Iv Right AC flushed without difficulty.  Pt continuously trying to climb out of bed Sitter at her side.  Foley to gravity at bedside.

## 2019-10-15 NOTE — Progress Notes (Addendum)
Lisa [name]  [id number] DOB: 1946/03/10 DOA: 10/10/2019 PCP: Kelton Pillar, MD    Brief Narrative:  74yo w/ a hx of Systolic CHF ([id number]), AICD, COPD, HTN, and DM2 who presented w/ SOB and was found to have a sat in the 60s on RA.   Significant Events: 1/11 COVID+ 1/15 admit via   1/16 severe encephalopathy -Neurology consultation -EEG without evidence of seizure  COVID-19 specific Treatment: Remdesivir 1/15 > 1/19 Decadron 1/15 >   Antimicrobials:  Anti-infectives (From admission, onward)   Start     Dose/Rate Route Frequency Ordered Stop   10/15/19 1415  cefTRIAXone (ROCEPHIN) 1 g in sodium chloride 0.9 % 100 mL IVPB     1 g 200 mL/hr over 30 Minutes Intravenous Every 24 hours 10/15/19 1412 10/20/19 1414   10/11/19 1000  remdesivir 100 mg in sodium chloride 0.9 % 100 mL IVPB     100 mg 200 mL/hr over 30 Minutes Intravenous Daily 10/10/19 1545 10/14/19 1912   10/10/19 1545  remdesivir 200 mg in sodium chloride 0.9% 250 mL IVPB     200 mg 580 mL/hr over 30 Minutes Intravenous Once 10/10/19 1545 10/10/19 1804       Subjective: According to nursing staff patient's mentation appears to be worse today.  Patient does look at me when I call her name.  She follows certain commands but not all.    Assessment & Plan:  Pneumonia due to COVID-19/acute respiratory failure with hypoxia Looks like patient's oxygen requirements have improved.  She is down to 6 L by HFNC.  She has completed course of remdesivir.  D-dimer elevated at 2.64.  Ferritin normal this morning.  CRP improved to 5.1.  Could consider decreasing the dose of dexamethasone which could be contributing to her encephalopathy.  Continue to monitor for now.    Recent Labs  Lab 10/10/19 1257 10/10/19 1257 10/11/19 0121 10/11/19 0810 10/12/19 0530 10/13/19 0120 10/14/19 0511 10/15/19 0610  DDIMER 1.48*   < > 2.49*  --  3.61* 2.44* 2.59* 2.64*  FERRITIN 204   < >  --  286 448* 520* 481* 302  CRP  13.0*   < > 13.47*  --  21.5* 18.4* 8.9* 5.1*  ALT 18   < > 17  --  16 17 20 20   PROCALCITON 0.32  --   --   --   --   --   --   --    < > = values in this interval not displayed.    Acute metabolic encephalopathy Reason for this is not entirely clear but likely multifactorial including COVID-19, medication effect.  She was apparently given Zyprexa.  B12 and folate acid levels were not low.  TSH was normal.  Ammonia only mildly elevated.  This case was also discussed with neurology who felt that this was likely medication effect.  EEG did not show any seizure activity.  All sedatives were stopped.  We will decrease dose of steroids today.  Digoxin level was less than 0.2 on 1/19.[name]  Per family patient does not have any cognitive issues at baseline.  UA noted to be abnormal.  Will treat with ceftriaxone.  Urinary tract infection UA noted to be abnormal.  Send urine culture.  Started ceftriaxone.  Chronic systolic CHF EF is 123456 based on echocardiogram from December.  Followed by cardiology.  Continue ins and outs.  Daily weights.  Does not appear to be overtly fluid overloaded.  Acute kidney injury on  chronic kidney disease stage II Creatinine seems to be back to her baseline.  Monitor urine output.  Diuretics continued at a lower dose.  Potassium noted to be mildly elevated at 5.2.  Will recheck tomorrow morning.  Macrocytosis 123456 folic acid and TSH levels were not abnormal.  Prolonged QTC Noted to be normal this morning on telemetry.  Monitor electrolytes.  Diabetes mellitus type 2, uncontrolled with hyperglycemia A1c 8.3.  Elevated blood glucose levels likely due to steroids.  Patient on Levemir, SSI.  Continue to monitor.  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  PVD s/p fem/fem bypass Stable  DVT prophylaxis: On Lovenox Code Status: FULL CODE Family Communication: We will update daughter later today Disposition Plan: Mobilize when able.  Hopefully return home when improved.  Consultants:  Neurology   Objective: Blood pressure (!) 123/51, pulse 70, temperature 98.1 F (36.7 C), temperature source Axillary, resp. rate 20, height 5\' 10"  (1.778 m), weight (!) 177.7 kg, SpO2 99 %.  Intake/Output Summary (Last 24 hours) at 10/15/2019 1411 Last data filed at 10/15/2019 1304 Gross per 24 hour  Intake 540 ml  Output 3425 ml  Net -2885 ml   Filed Weights   10/10/19 1152 10/10/19 2228  Weight: 79.4 kg (!) 177.7 kg    Examination:  General appearance: Awake alert but does not follow commands.  Noted to be encephalopathic Resp: Mildly tachypneic at rest.  Coarse breath sounds bilaterally with crackles at the bases. Cardio: S1-S2 is normal regular.  No S3-S4.  No rubs murmurs or bruit GI: Abdomen is soft.  Nontender nondistended.  Bowel sounds are present normal.  No masses organomegaly Extremities: No edema.  Moving all her extremities Neurologic: No obvious facial asymmetry.  Moving all her extremities.   CBC: Recent Labs  Lab 10/13/19 0120 10/14/19 0511 10/15/19 0610  WBC 10.7* 9.4 8.8  NEUTROABS 10.0* 8.7* 8.0*  HGB 13.2 14.2 14.0  HCT 41.3 45.2 45.2  MCV 102.0* 104.9* 104.4*  PLT 271 333 0000000   Basic Metabolic Panel: Recent Labs  Lab 10/13/19 0120 10/14/19 0511 10/15/19 0610  NA 138 143 143  K 5.5* 5.7* 5.2*  CL 102 105 100  CO2 28 26 33*  GLUCOSE 292* 302* 290*  BUN 29* 40* 42*  CREATININE 1.01* 1.22* 1.14*  CALCIUM 9.1 9.5 9.5  MG 2.8* 3.1* 2.6*  PHOS 2.1* 3.7 4.6   GFR: Estimated Creatinine Clearance: 79 mL/min (A) (by C-G formula based on SCr of 1.14 mg/dL (H)).  Liver Function Tests: Recent Labs  Lab 10/12/19 0530 10/13/19 0120 10/14/19 0511 10/15/19 0610  AST 32 30 29 24   ALT 16 17 20 20     ALKPHOS 43 51 66 65  BILITOT 0.7 0.5 0.6 0.7  PROT 7.0 7.5 8.3* 8.1  ALBUMIN 3.0* 3.2* 3.5 3.4*    HbA1C: Hgb A1c MFr Bld  Date/Time Value Ref Range Status  10/10/2019 05:10 PM 8.3 (H) 4.8 - 5.6 % Final    Comment:    (NOTE) Pre diabetes:          5.7%-6.4% Diabetes:              >6.4% Glycemic control for   <7.0% adults with diabetes   03/19/2018 04:00 AM 7.0 (H) 4.8 - 5.6 % Final    Comment:    (  NOTE) Pre diabetes:          5.7%-6.4% Diabetes:              >6.4% Glycemic control for   <7.0% adults with diabetes     CBG: Recent Labs  Lab 10/14/19 1214 10/14/19 1637 10/14/19 2029 10/15/19 0757 10/15/19 1148  GLUCAP 397* 349* 386* 307* 249*    Recent Results (from the past 240 hour(s))  SARS CORONAVIRUS 2 (TAT 6-24 HRS) Nasopharyngeal Nasopharyngeal Swab     Status: Abnormal   Collection Time: 10/10/19 12:38 PM   Specimen: Nasopharyngeal Swab  Result Value Ref Range Status   SARS Coronavirus 2 POSITIVE (A) NEGATIVE Final    Comment: RESULT CALLED TO, READ BACK BY AND VERIFIED WITH: A.OLEARY RN 2147 10/10/19 MCCORMICK K  (NOTE) SARS-CoV-2 target nucleic acids are DETECTED. The SARS-CoV-2 RNA is generally detectable in upper and lower respiratory specimens during the acute phase of infection. Positive results are indicative of the presence of SARS-CoV-2 RNA. Clinical correlation with patient history and other diagnostic information is  necessary to determine patient infection status. Positive results do not rule out bacterial infection or co-infection with other viruses.  The expected result is Negative. Fact Sheet for Patients: SugarRoll.be Fact Sheet for Healthcare Providers: https://www.woods-mathews.com/ This test is not yet approved or cleared by the [place] FDA and  has been authorized for detection and/or diagnosis of SARS-CoV-2 by FDA under an Emergency Use Authorization (EUA). This EUA will remain  in  effect (meaning this test can be used) for  the duration of the COVID-19 declaration under Section 564(b)(1) of the Act, 21 U.S.C. section 360bbb-3(b)(1), unless the authorization is terminated or revoked sooner. Performed at [facility], [place] [address] [place], [place] [address]   Blood Culture (routine x 2)     Status: None   Collection Time: 10/10/19  2:48 PM   Specimen: BLOOD  Result Value Ref Range Status   Specimen Description BLOOD RIGHT ANTECUBITAL  Final   Special Requests   Final    BOTTLES DRAWN AEROBIC AND ANAEROBIC Blood Culture adequate volume   Culture   Final    NO GROWTH 5 DAYS Performed at [facility], [place] [address], [place], [place] [address]    Report Status 10/15/2019 FINAL  Final  Blood Culture (routine x 2)     Status: None   Collection Time: 10/10/19  5:10 PM   Specimen: BLOOD RIGHT HAND  Result Value Ref Range Status   Specimen Description BLOOD RIGHT HAND  Final   Special Requests   Final    BOTTLES DRAWN AEROBIC AND ANAEROBIC Blood Culture adequate volume   Culture   Final    NO GROWTH 5 DAYS Performed at [facility], [place] [address], [place], [place] [address]    Report Status 10/15/2019 FINAL  Final     Scheduled Meds: . allopurinol  100 mg Oral Daily  . aspirin EC  162 mg Oral Daily  . atorvastatin  20 mg Oral q1800  . carvedilol  12.5 mg Oral BID WC  . Chlorhexidine Gluconate Cloth  6 each Topical Daily  . dexamethasone (DECADRON) injection  6 mg Intravenous Q24H  . digoxin  0.0625 mg Oral Daily  . enoxaparin (LOVENOX) injection  90 mg Subcutaneous Q24H  . fluticasone furoate-vilanterol  1 puff Inhalation Daily  . furosemide  40 mg Intravenous Daily  . insulin aspart  0-15 Units Subcutaneous TID WC  . insulin aspart  0-5 Units Subcutaneous QHS  . insulin aspart  5 Units Subcutaneous TID WC  . insulin detemir  20 Units Subcutaneous BID  . levothyroxine  25 mcg Oral Q0600  . linagliptin  5 mg Oral Daily  .  pantoprazole  40 mg Oral Daily  . pregabalin  100 mg Oral QPM   Continuous Infusions:    LOS: 5 days   [facility] Office  [phone] Pager - Text Page per Shea Evans  If 7PM-7AM, please contact night-coverage per Amion 10/15/2019, 2:11 PM

## 2019-10-15 NOTE — Evaluation (Addendum)
Occupational Therapy Evaluation Patient Details Name: Lisa [name] MRN: [id number] DOB: 1946/08/11 Today's Date: 10/15/2019    History of Present Illness 74 y/o female w/ PMHx; PNA, PAD, non ischemic cardiomyopathy, neuropathy, mild CAD, HTN, HLD, GERD, former tobacco use, dyspnea, DM, COPD, chronic systolic CHF, Bell's Palsy, arthritis, AICD, B joint replacements, COVID + 1/11   Clinical Impression   This 74 y/o female presents with the above. PLOF obtained via daughter who reports pt very independent PTA, going to the gym daily and performing iADL including driving. Pt presenting with weakness, decreased respiratory status and significant cognitive impairments impacting her functional performance. Pt requiring maxA+2 for safe completion of bed mobility and for assisting with pericare/changing bed linens during session. Pt minimally following basic commands, but with increased time and cues able to state her last name and DOB. Attempted hand over hand for simple grooming ADL (face washing) however pt with no initiation to carry over task on her own, overall is currently totalA for ADL. Pt will benefit from continued acute OT services, and will benefit from post acute rehab services prior to return home to progress her towards her PLOF. Given pt's level of independence PTA she may be a good candidate for CIR (pending ability to meet admission criteria). Will continue to follow.   Follow Up Recommendations  SNF;CIR;Supervision/Assistance - 24 hour(pending pt progress)    Equipment Recommendations  Other (comment)(TBD)    Recommendations for Other Services  Rehab Consult     Precautions / Restrictions Precautions Precautions: Fall Restrictions Weight Bearing Restrictions: No      Mobility Bed Mobility Overal bed mobility: Needs Assistance Bed Mobility: Rolling Rolling: Max assist;Total assist;+2 for physical assistance;+2 for safety/equipment         General bed mobility  comments: assist for rolling to L/R for pericare/changing bed linens, increased assist as pt not following commands, however noted pt able to roll herself towards the L without assist when she chooses   Transfers                 General transfer comment: deferred    Balance                                           ADL either performed or assessed with clinical judgement   ADL Overall ADL's : Needs assistance/impaired       Grooming Details (indicate cue type and reason): max-total hand over hand assist required to attempt simple grooming ADL                               General ADL Comments: pt overall totalA for ADL tasks, appears largely due to current cognition level, assisted with pericare and changing gown/bed linens during session                          Pertinent Vitals/Pain Pain Assessment: Faces Faces Pain Scale: Hurts little more Pain Location: grimacing with posterior pericare Pain Descriptors / Indicators: Grimacing;Discomfort Pain Intervention(s): Limited activity within patient's tolerance;Monitored during session;Repositioned     Hand Dominance     Extremity/Trunk Assessment Upper Extremity Assessment Upper Extremity Assessment: Difficult to assess due to impaired cognition;Generalized weakness   Lower Extremity Assessment Lower Extremity Assessment: Defer to PT evaluation  Communication Communication Communication: Expressive difficulties   Cognition Arousal/Alertness: Lethargic Behavior During Therapy: Restless Overall Cognitive Status: Impaired/Different from baseline Area of Impairment: Orientation;Attention;Memory;Following commands;Safety/judgement;Awareness;Problem solving                 Orientation Level: Disoriented to;Place;Time;Situation Current Attention Level: Focused Memory: Decreased recall of precautions;Decreased short-term memory Following Commands: Follows one step  commands inconsistently Safety/Judgement: Decreased awareness of safety;Decreased awareness of deficits Awareness: Intellectual Problem Solving: Decreased initiation;Difficulty sequencing;Requires verbal cues;Requires tactile cues General Comments: pt very minimally following commands (<25%), required max cues/repetition to state her last name and DOB, but given increased time pt able to do so   General Comments  VSS on 6L HFNC    Exercises     Shoulder Instructions      Home Living Family/patient expects to be discharged to:: Private residence Living Arrangements: Children;Spouse/significant other(grandchildren) Available Help at Discharge: Family Type of Home: House       Home Layout: One level     Bathroom Shower/Tub: Teacher, early years/pre: Standard     Home Equipment: None   Additional Comments: home setup obtained from daughter      Prior Functioning/Environment Level of Independence: Independent        Comments: driving, active, going to the gym every day         OT Problem List: Decreased strength;Decreased range of motion;Decreased activity tolerance;Impaired balance (sitting and/or standing);Decreased cognition;Decreased safety awareness;Decreased knowledge of use of DME or AE;Cardiopulmonary status limiting activity;Decreased knowledge of precautions      OT Treatment/Interventions: Self-care/ADL training;Therapeutic exercise;Energy conservation;DME and/or AE instruction;Therapeutic activities;Cognitive remediation/compensation;Patient/family education;Balance training    OT Goals(Current goals can be found in the care plan section) Acute Rehab OT Goals Patient Stated Goal: none stated OT Goal Formulation: Patient unable to participate in goal setting Time For Goal Achievement: 10/29/19 Potential to Achieve Goals: Good ADL Goals Pt Will Perform Eating: with supervision;with set-up;sitting Pt Will Perform Grooming: with min  assist;sitting Pt Will Perform Upper Body Bathing: with min assist;sitting Pt Will Perform Lower Body Bathing: with mod assist;sit to/from stand;sitting/lateral leans Pt Will Perform Upper Body Dressing: with min assist;sitting Pt Will Transfer to Toilet: with min assist;stand pivot transfer Additional ADL Goal #1: Pt will follow 1 step simple commands with 75% accuracy. Additional ADL Goal #2: Pt will sustain attention to ADL/functional task >2min with no more than min cueing.  OT Frequency: Min 2X/week   Barriers to D/C:            Co-evaluation              AM-PAC OT "6 Clicks" Daily Activity     Outcome Measure Help from another person eating meals?: Total Help from another person taking care of personal grooming?: Total Help from another person toileting, which includes using toliet, bedpan, or urinal?: Total Help from another person bathing (including washing, rinsing, drying)?: Total Help from another person to put on and taking off regular upper body clothing?: Total Help from another person to put on and taking off regular lower body clothing?: Total 6 Click Score: 6   End of Session Equipment Utilized During Treatment: Oxygen Nurse Communication: Mobility status  Activity Tolerance: Patient limited by lethargy Patient left: in bed;with call bell/phone within reach;with restraints reapplied;with nursing/sitter in room(safety sitter present )  OT Visit Diagnosis: Muscle weakness (generalized) (M62.81);Other symptoms and signs involving cognitive function                Time:  [id number] OT Time Calculation (min): 25 min Charges:  OT General Charges $OT Visit: 1 Visit OT Evaluation $OT Eval Moderate Complexity: 1 Mod OT Treatments $Self Care/Home Management : 8-22 mins  Lou Cal, OT [facility] Services Pager [phone] Office [phone]   Raymondo Band 10/15/2019, 4:21 PM

## 2019-10-16 LAB — CBC
HCT: 45.5 % (ref 36.0–46.0)
Hemoglobin: 14.1 g/dL (ref 12.0–15.0)
MCH: 32.3 pg (ref 26.0–34.0)
MCHC: 31 g/dL (ref 30.0–36.0)
MCV: 104.4 fL — ABNORMAL HIGH (ref 80.0–100.0)
Platelets: 392 10*3/uL (ref 150–400)
RBC: 4.36 MIL/uL (ref 3.87–5.11)
RDW: 14.3 % (ref 11.5–15.5)
WBC: 11 10*3/uL — ABNORMAL HIGH (ref 4.0–10.5)
nRBC: 0 % (ref 0.0–0.2)

## 2019-10-16 LAB — COMPREHENSIVE METABOLIC PANEL
ALT: 19 U/L (ref 0–44)
AST: 24 U/L (ref 15–41)
Albumin: 3.3 g/dL — ABNORMAL LOW (ref 3.5–5.0)
Alkaline Phosphatase: 72 U/L (ref 38–126)
Anion gap: 8 (ref 5–15)
BUN: 38 mg/dL — ABNORMAL HIGH (ref 8–23)
CO2: 36 mmol/L — ABNORMAL HIGH (ref 22–32)
Calcium: 9.5 mg/dL (ref 8.9–10.3)
Chloride: 104 mmol/L (ref 98–111)
Creatinine, Ser: 1.08 mg/dL — ABNORMAL HIGH (ref 0.44–1.00)
GFR calc Af Amer: 59 mL/min — ABNORMAL LOW (ref >60)
GFR calc non Af Amer: 51 mL/min — ABNORMAL LOW (ref >60)
Glucose, Bld: 277 mg/dL — ABNORMAL HIGH (ref 70–99)
Potassium: 4.7 mmol/L (ref 3.5–5.1)
Sodium: 148 mmol/L — ABNORMAL HIGH (ref 135–145)
Total Bilirubin: 0.6 mg/dL (ref 0.3–1.2)
Total Protein: 7.9 g/dL (ref 6.5–8.1)

## 2019-10-16 LAB — GLUCOSE, CAPILLARY
Glucose-Capillary: 228 mg/dL — ABNORMAL HIGH (ref 70–99)
Glucose-Capillary: 274 mg/dL — ABNORMAL HIGH (ref 70–99)
Glucose-Capillary: 323 mg/dL — ABNORMAL HIGH (ref 70–99)
Glucose-Capillary: 329 mg/dL — ABNORMAL HIGH (ref 70–99)
Glucose-Capillary: 371 mg/dL — ABNORMAL HIGH (ref 70–99)

## 2019-10-16 MED ORDER — TAFAMIDIS 61 MG PO CAPS: 61.0000 mg | ORAL_CAPSULE | Freq: Every day | ORAL | Status: DC

## 2019-10-16 MED ORDER — INSULIN DETEMIR 100 UNIT/ML ~~LOC~~ SOLN
25.0000 [IU] | Freq: Two times a day (BID) | SUBCUTANEOUS | Status: DC
  Administered 2019-10-16 – 2019-10-18 (×4): 25 [IU] via SUBCUTANEOUS
  Filled 2019-10-16 (×5): qty 0.25

## 2019-10-16 NOTE — Progress Notes (Signed)
Inpatient Diabetes Program Recommendations  AACE/ADA: New Consensus Statement on Inpatient Glycemic Control (2015)  Target Ranges:  Prepandial:   less than 140 mg/dL      Peak postprandial:   less than 180 mg/dL (1-2 hours)      Critically ill patients:  140 - 180 mg/dL   Lab Results  Component Value Date   GLUCAP 274 (H) 10/16/2019   HGBA1C 8.3 (H) 10/10/2019    Review of Glycemic Control Results for MAYA, CALER (MRN [id number]) as of 10/16/2019 11:15  Ref. Range 10/15/2019 07:57 10/15/2019 11:48 10/15/2019 16:47 10/15/2019 19:59 10/16/2019 00:19 10/16/2019 07:46  Glucose-Capillary Latest Ref Range: 70 - 99 mg/dL 307 (H) 249 (H) 179 (H) 407 (H) 329 (H) 274 (H)  Diabetes history: DM 2 Outpatient Diabetes medications:  Metformin [id number] mg bid, Trulicity 1.5 weekly Current orders for Inpatient glycemic control:  Novolog moderate tid with meals and HS Decadron 4 mg IV q 24 hours Levemir 20 units q 12 hours Novolog 5 units tid with meals Tradjenta 5 mg daily Inpatient Diabetes Program Recommendations:   Please consider increasing Levemir to 25 units bid.    Thanks  Adah Perl, RN, BC-ADM Inpatient Diabetes Coordinator Pager [phone]

## 2019-10-16 NOTE — Progress Notes (Signed)
Physical Therapy Treatment Patient Details Name: Lisa [name] MRN: [id number] DOB: 1946-01-04 Today's Date: 10/16/2019    History of Present Illness 74 y/o female w/ PMHx; PNA, PAD, non ischemic cardiomyopathy, neuropathy, mild CAD, HTN, HLD, GERD, former tobacco use, dyspnea, DM, COPD, chronic systolic CHF, Bell's Palsy, arthritis, AICD, B joint replacements, COVID + 1/11    PT Comments    Slight progress from last session noted. Pt was able to get to edge of bed today with max a, able to sit approx 10 mins with BUE support and no extremal support (sitter/ CNA assisted with feeding while up). Attempted to stand and pt was able to stand with max a x 2 with hand held assist, was not able to take any steps at edge of bed. Pt was on 6L/min via HFNC and managed to keep saturations in 90s throughout session.     Follow Up Recommendations  Supervision/Assistance - 24 hour;Other (comment);SNF(may benefit from post acute rehab but unsure of tolerance)     Equipment Recommendations  None recommended by PT    Recommendations for Other Services       Precautions / Restrictions Precautions Precautions: Fall;Other (comment) Precaution Comments: cognition 02 sats, RR, B hands in soft mitt restraints Restrictions Weight Bearing Restrictions: No    Mobility  Bed Mobility Overal bed mobility: Needs Assistance Bed Mobility: Rolling;Supine to Sit;Sit to Supine Rolling: Max assist   Supine to sit: Max assist Sit to supine: Mod assist   General bed mobility comments: needed more assist to get to edge of bed today, once up has moment of clutching at therapist needing support and encouragement to hold on to rail and maintain sitting without extrenal support  Transfers Overall transfer level: Needs assistance Equipment used: 2 person hand held assist;None Transfers: Sit to/from Stand Sit to Stand: +2 physical assistance;Max assist         General transfer comment: needed more assist  to complete this also today, noted to be clutching again in front of her (but nothing was there), was able to stand with max a x 2 and HHA but unable to scoot or take side steps this pm  Ambulation/Gait             General Gait Details: unable to complete at this time   Stairs             Wheelchair Mobility    Modified Rankin (Stroke Patients Only)       Balance Overall balance assessment: Needs assistance Sitting-balance support: Feet supported;Bilateral upper extremity supported Sitting balance-Leahy Scale: Fair Sitting balance - Comments: able to sit edge of bed, hold on to bed rail and bed and maintains sitting w/o extrenal support CNA assisted with feeding while sitting edge of bed     Standing balance-Leahy Scale: Poor Standing balance comment: needs max a x2 to complete                            Cognition Arousal/Alertness: Lethargic Behavior During Therapy: Anxious;Flat affect Overall Cognitive Status: Impaired/Different from baseline Area of Impairment: Orientation;Attention;Memory;Following commands;Safety/judgement;Awareness;Problem solving                 Orientation Level: Disoriented to;Place;Time;Situation   Memory: Decreased recall of precautions;Decreased short-term memory Following Commands: Follows one step commands inconsistently;Follows one step commands with increased time Safety/Judgement: Decreased awareness of safety;Decreased awareness of deficits   Problem Solving: Decreased initiation;Difficulty sequencing;Requires verbal cues;Requires tactile cues General  Comments: would only nod and shake head every so often, and squeeze therapist's hand (not requested).      Exercises      General Comments General comments (skin integrity, edema, etc.): pt on 6L/min via HFNC and able to maintain sats in 90s, HR did not increase past 110s, RR in 20-30s      Pertinent Vitals/Pain Pain Assessment: Faces Faces Pain Scale:  Hurts little more Pain Descriptors / Indicators: Grimacing;Discomfort Pain Intervention(s): Limited activity within patient's tolerance;Monitored during session    Home Living                      Prior Function            PT Goals (current goals can now be found in the care plan section) Acute Rehab PT Goals Patient Stated Goal: not verbalizing today (usually will say few words) Time For Goal Achievement: 10/25/19 Potential to Achieve Goals: Fair Progress towards PT goals: Progressing toward goals(min progress noted)    Frequency    Min 3X/week      PT Plan Discharge plan needs to be updated    Co-evaluation              AM-PAC PT "6 Clicks" Mobility   Outcome Measure  Help needed turning from your back to your side while in a flat bed without using bedrails?: A Lot Help needed moving from lying on your back to sitting on the side of a flat bed without using bedrails?: A Lot Help needed moving to and from a bed to a chair (including a wheelchair)?: A Lot Help needed standing up from a chair using your arms (e.g., wheelchair or bedside chair)?: A Lot Help needed to walk in hospital room?: Total Help needed climbing 3-5 steps with a railing? : Total 6 Click Score: 10    End of Session Equipment Utilized During Treatment: Oxygen Activity Tolerance: Patient limited by fatigue;Patient limited by lethargy;Treatment limited secondary to medical complications (Comment) Patient left: in bed;with call bell/phone within reach;with nursing/sitter in room Nurse Communication: (sitter was in room throughout) PT Visit Diagnosis: Other abnormalities of gait and mobility (R26.89)     Time: [id number] PT Time Calculation (min) (ACUTE ONLY): 28 min  Charges:  $Therapeutic Activity: 8-22 mins $Neuromuscular Re-education: 8-22 mins                     Horald Chestnut, PT    Delford Field 10/16/2019, 3:25 PM

## 2019-10-16 NOTE — Progress Notes (Signed)
Ok to continue to hold tafamidis for now per Dr Curly Rim due to swallowing issue.   Onnie Boer, PharmD, BCIDP, AAHIVP, CPP Infectious Disease Pharmacist 10/16/2019 3:28 PM

## 2019-10-16 NOTE — Progress Notes (Signed)
1800 Pt has had bouts of  Confusion today but overall seems to be more calm that previous day.  Removed pt foley cath  Pt voiding incontinent of bowel and bladder  Pt daughters Lenna Sciara and Freda Munro were contacted this am and updated on pt progression. Pt pulled IV from right [facility] RN replaced to right wrist with 22G. [name] called back this afternoon to get Dr to call her back  Dr attempted around 2 pm  I texted him but no response

## 2019-10-16 NOTE — Progress Notes (Signed)
Lisa [name]  [id number] DOB: [birth year] DOA: 10/10/2019 PCP: Kelton Pillar, MD    Brief Narrative:  74yo w/ a hx of Systolic CHF ([id number]), AICD, COPD, HTN, and DM2 who presented w/ SOB and was found to have a sat in the 60s on RA.   Significant Events: 1/11 COVID+ 1/15 admit via [place]  1/16 severe encephalopathy -Neurology consultation -EEG without evidence of seizure    Antimicrobials:  Anti-infectives (From admission, onward)   Start     Dose/Rate Route Frequency Ordered Stop   10/15/19 1500  cefTRIAXone (ROCEPHIN) 1 g in sodium chloride 0.9 % 100 mL IVPB     1 g 200 mL/hr over 30 Minutes Intravenous Every 24 hours 10/15/19 1412 10/20/19 1459   10/11/19 1000  remdesivir 100 mg in sodium chloride 0.9 % 100 mL IVPB     100 mg 200 mL/hr over 30 Minutes Intravenous Daily 10/10/19 1545 10/14/19 1912   10/10/19 1545  remdesivir 200 mg in sodium chloride 0.9% 250 mL IVPB     200 mg 580 mL/hr over 30 Minutes Intravenous Once 10/10/19 1545 10/10/19 1804       Subjective: According to nursing staff patient seems to be more cooperative and following more commands today.  She does appear to be much more calmer today compared to yesterday.  She did respond to me however not following commands consistently.  Assessment & Plan:  Pneumonia due to COVID-19/acute respiratory failure with hypoxia From a respiratory standpoint patient has been improving.  She was down to 6 L of oxygen.  Looks like she was bumped up to 12 L overnight.  She is saturating in the late 90s.  Could try and wean her down today.  She has completed course of remdesivir.  Due to encephalopathy her dose of dexamethasone has been decreased.  Her D-dimer is stable.  CRP has improved to 5.1.  Continue to monitor for now.  Incentive spirometry mobilization as able.  Recent Labs  Lab 10/10/19 1257 10/10/19 1257 10/11/19 0121 10/11/19 0121 10/11/19 0810 10/12/19 0530 10/13/19 0120 10/14/19 0511  10/15/19 0610 10/16/19 0920  DDIMER 1.48*   < > 2.49*  --   --  3.61* 2.44* 2.59* 2.64*  --   FERRITIN 204   < >  --   --  286 448* 520* 481* 302  --   CRP 13.0*   < > 13.47*  --   --  21.5* 18.4* 8.9* 5.1*  --   ALT 18   < > 17   < >  --  16 17 20 20 19   PROCALCITON 0.32  --   --   --   --   --   --   --   --   --    < > = values in this interval not displayed.    Acute metabolic encephalopathy Reason for this is not entirely clear but likely multifactorial including COVID-19, medication effect.  She was apparently given Zyprexa.  B12 and folate acid levels were not low.  TSH was normal.  Ammonia only mildly elevated.  This case was also discussed with neurology who felt that this was likely medication effect.  EEG did not show any seizure activity.  All sedatives were stopped.  Dose of steroid was decreased.  Digoxin level was less than 0.2 on 1/19.[name]  Per family patient does not have any cognitive issues at baseline.  UA noted to be abnormal.  Patient was started on ceftriaxone.  Mentation has improved slightly.  No focal deficits noted on exam.  Urinary tract infection UA noted to be abnormal.  Urine culture.  Ceftriaxone.  Chronic systolic CHF EF is [id number] based on echocardiogram from December.  Followed by cardiology.  Continue ins and outs.  Daily weights.  Does not appear to be overtly fluid overloaded.  Acute kidney injury on chronic kidney disease stage II/hyperkalemia/hypernatremia Creatinine seems to be back to her baseline.  Continue to monitor urine output.  Diuretics being continued at a lower dose.  Hyponatremia noted.  Recheck tomorrow.  Potassium improved to 4.7.    Macrocytosis 123456 folic acid and TSH levels were not abnormal.  Prolonged QTC Noted to be normal this morning on telemetry.  Monitor electrolytes.  Diabetes mellitus type 2, uncontrolled with hyperglycemia A1c 8.3.  Elevated blood glucose levels likely due to steroids.  Patient on Levemir, SSI.  Continue to  monitor.  Adjust insulin dose as needed.  Hypothyroidism  Continue levothyroxine.    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  Sure is stable for the most part.  PVD s/p fem/fem bypass Stable  DVT prophylaxis: On Lovenox Code Status: FULL CODE Family Communication: Daughter was updated yesterday.  Will do so again today. Disposition Plan: Mobilize when able. PT/OT.  Consultants:  Neurology   Objective: Blood pressure (!) 107/57, pulse (!) 107, temperature (!) 97.3 F (36.3 C), temperature source Axillary, resp. rate 20, height 5\' 10"  (1.778 m), weight (!) 177.7 kg, SpO2 99 %.  Intake/Output Summary (Last 24 hours) at 10/16/2019 1146 Last data filed at 10/16/2019 0947 Gross per 24 hour  Intake 950 ml  Output 1250 ml  Net -300 ml   Filed Weights   10/10/19 1152 10/10/19 2228  Weight: 79.4 kg (!) 177.7 kg    Examination:  General appearance: Noted to be calmer this morning.  More awake than yesterday.  Still not following commands. Resp: Mildly tachypneic at rest.  No use of accessory muscles.  Crackles bilateral bases.  No wheezing or rhonchi.   Cardio: S1-S2 is normal regular.  No S3-S4.  No rubs murmurs or bruit GI: Abdomen is soft.  Nontender nondistended.  Bowel sounds are present normal.  No masses organomegaly Extremities: No edema.  Noted to be moving her extremities Neurologic: Remains disoriented encephalopathic.  No facial asymmetry.  Moving all her extremities.    CBC: Recent Labs  Lab 10/13/19 0120 10/13/19 0120 10/14/19 0511 10/15/19 0610 10/16/19 0920  WBC 10.7*   < > 9.4 8.8 11.0*  NEUTROABS 10.0*  --  8.7* 8.0*  --   HGB 13.2   < > 14.2 14.0 14.1  HCT 41.3   < > 45.2 45.2 45.5  MCV 102.0*   < > 104.9* 104.4* 104.4*  PLT 271   < > 333 370 392   < > = values in  this interval not displayed.   Basic Metabolic Panel: Recent Labs  Lab 10/13/19 0120 10/13/19 0120 10/14/19 0511 10/15/19 0610 10/16/19 0920  NA 138   < > 143 143 148*  K 5.5*   < > 5.7* 5.2* 4.7  CL 102   < > 105 100 104  CO2 28   < > 26 33* 36*  GLUCOSE 292*   < > 302* 290* 277*  BUN 29*   < > 40* 42* 38*  CREATININE  1.01*   < > 1.22* 1.14* 1.08*  CALCIUM 9.1   < > 9.5 9.5 9.5  MG 2.8*  --  3.1* 2.6*  --   PHOS 2.1*  --  3.7 4.6  --    < > = values in this interval not displayed.   GFR: Estimated Creatinine Clearance: 83.4 mL/min (A) (by C-G formula based on SCr of 1.08 mg/dL (H)).  Liver Function Tests: Recent Labs  Lab 10/13/19 0120 10/14/19 0511 10/15/19 0610 10/16/19 0920  AST 30 29 24 24   ALT 17 20 20 19   ALKPHOS 51 66 65 72  BILITOT 0.5 0.6 0.7 0.6  PROT 7.5 8.3* 8.1 7.9  ALBUMIN 3.2* 3.5 3.4* 3.3*    HbA1C: Hgb A1c MFr Bld  Date/Time Value Ref Range Status  10/10/2019 05:10 PM 8.3 (H) 4.8 - 5.6 % Final    Comment:    (NOTE) Pre diabetes:          5.7%-6.4% Diabetes:              >6.4% Glycemic control for   <7.0% adults with diabetes   03/19/2018 04:00 AM 7.0 (H) 4.8 - 5.6 % Final    Comment:    (NOTE) Pre diabetes:          5.7%-6.4% Diabetes:              >6.4% Glycemic control for   <7.0% adults with diabetes     CBG: Recent Labs  Lab 10/15/19 1148 10/15/19 1647 10/15/19 1959 10/16/19 0019 10/16/19 0746  GLUCAP 249* 179* 407* 329* 274*    Recent Results (from the past 240 hour(s))  SARS CORONAVIRUS 2 (TAT 6-24 HRS) Nasopharyngeal Nasopharyngeal Swab     Status: Abnormal   Collection Time: 10/10/19 12:38 PM   Specimen: Nasopharyngeal Swab  Result Value Ref Range Status   SARS Coronavirus 2 POSITIVE (A) NEGATIVE Final    Comment: RESULT CALLED TO, READ BACK BY AND VERIFIED WITH: A.OLEARY RN 2147 10/10/19 MCCORMICK K  (NOTE) SARS-CoV-2 target nucleic acids are DETECTED. The SARS-CoV-2 RNA is generally detectable in upper and  lower respiratory specimens during the acute phase of infection. Positive results are indicative of the presence of SARS-CoV-2 RNA. Clinical correlation with patient history and other diagnostic information is  necessary to determine patient infection status. Positive results do not rule out bacterial infection or co-infection with other viruses.  The expected result is Negative. Fact Sheet for Patients: SugarRoll.be Fact Sheet for Healthcare Providers: https://www.woods-mathews.com/ This test is not yet approved or cleared by the [place] FDA and  has been authorized for detection and/or diagnosis of SARS-CoV-2 by FDA under an Emergency Use Authorization (EUA). This EUA will remain  in effect (meaning this test can be used) for  the duration of the COVID-19 declaration under Section 564(b)(1) of the Act, 21 U.S.C. section 360bbb-3(b)(1), unless the authorization is terminated or revoked sooner. Performed at [facility], [place] [address], [place], [place] [address]   Blood Culture (routine x 2)     Status: None   Collection Time: 10/10/19  2:48 PM   Specimen: BLOOD  Result Value Ref Range Status   Specimen Description BLOOD RIGHT ANTECUBITAL  Final   Special Requests   Final    BOTTLES DRAWN AEROBIC AND ANAEROBIC Blood Culture adequate volume   Culture   Final    NO GROWTH 5 DAYS Performed at [facility], [place] [address] [place], [place] [address]  Report Status 10/15/2019 FINAL  Final  Blood Culture (routine x 2)     Status: None   Collection Time: 10/10/19  5:10 PM   Specimen: BLOOD RIGHT HAND  Result Value Ref Range Status   Specimen Description BLOOD RIGHT HAND  Final   Special Requests   Final    BOTTLES DRAWN AEROBIC AND ANAEROBIC Blood Culture adequate volume   Culture   Final    NO GROWTH 5 DAYS Performed at [facility], [place] [address] [place], [place] [address]    Report Status 10/15/2019 FINAL   Final     Radiology  DG Chest Port 1 View  Result Date: 10/14/2019 CLINICAL DATA:  COVID-19 pneumonia. EXAM: PORTABLE CHEST 1 VIEW COMPARISON:  One-view chest x-ray 10/10/2019 FINDINGS: Heart is enlarged. Pacing and defibrillator wires are stable. Atherosclerotic changes are noted at the aortic arch. Bilateral interstitial and airspace opacities are again seen. There is progressive disease in the right upper lobe and lower lobe. Left pleural effusion is suspected. IMPRESSION: Progressive interstitial and airspace disease in the right upper lobe and lower lobe compatible with multi lobar pneumonia. Electronically Signed   By: San Morelle M.D.   On: 10/14/2019 06:42     Scheduled Meds: . allopurinol  100 mg Oral Daily  . aspirin EC  162 mg Oral Daily  . atorvastatin  20 mg Oral q1800  . carvedilol  12.5 mg Oral BID WC  . Chlorhexidine Gluconate Cloth  6 each Topical Daily  . dexamethasone (DECADRON) injection  4 mg Intravenous Q24H  . digoxin  0.0625 mg Oral Daily  . enoxaparin (LOVENOX) injection  90 mg Subcutaneous Q24H  . fluticasone furoate-vilanterol  1 puff Inhalation Daily  . furosemide  40 mg Intravenous Daily  . insulin aspart  0-15 Units Subcutaneous TID WC  . insulin aspart  0-5 Units Subcutaneous QHS  . insulin aspart  5 Units Subcutaneous TID WC  . insulin detemir  20 Units Subcutaneous BID  . levothyroxine  25 mcg Oral Q0600  . linagliptin  5 mg Oral Daily  . pantoprazole  40 mg Oral Daily  . pregabalin  100 mg Oral QPM   Continuous Infusions: . cefTRIAXone (ROCEPHIN)  IV 1 g (10/15/19 1625)     LOS: 6 days   [facility] Office  [phone] Pager - Text Page per Shea Evans  If 7PM-7AM, please contact night-coverage per Amion 10/16/2019, 11:46 AM

## 2019-10-17 DIAGNOSIS — I5022 Chronic systolic (congestive) heart failure: Secondary | ICD-10-CM

## 2019-10-17 LAB — COMPREHENSIVE METABOLIC PANEL
ALT: 19 U/L (ref 0–44)
AST: 19 U/L (ref 15–41)
Albumin: 3 g/dL — ABNORMAL LOW (ref 3.5–5.0)
Alkaline Phosphatase: 63 U/L (ref 38–126)
Anion gap: 10 (ref 5–15)
BUN: 39 mg/dL — ABNORMAL HIGH (ref 8–23)
CO2: 35 mmol/L — ABNORMAL HIGH (ref 22–32)
Calcium: 9 mg/dL (ref 8.9–10.3)
Chloride: 103 mmol/L (ref 98–111)
Creatinine, Ser: 1.06 mg/dL — ABNORMAL HIGH (ref 0.44–1.00)
GFR calc Af Amer: >60 mL/min (ref >60)
GFR calc non Af Amer: 52 mL/min — ABNORMAL LOW (ref >60)
Glucose, Bld: 170 mg/dL — ABNORMAL HIGH (ref 70–99)
Potassium: 4.5 mmol/L (ref 3.5–5.1)
Sodium: 148 mmol/L — ABNORMAL HIGH (ref 135–145)
Total Bilirubin: 0.6 mg/dL (ref 0.3–1.2)
Total Protein: 7.1 g/dL (ref 6.5–8.1)

## 2019-10-17 LAB — GLUCOSE, CAPILLARY
Glucose-Capillary: 179 mg/dL — ABNORMAL HIGH (ref 70–99)
Glucose-Capillary: 233 mg/dL — ABNORMAL HIGH (ref 70–99)
Glucose-Capillary: 342 mg/dL — ABNORMAL HIGH (ref 70–99)
Glucose-Capillary: 457 mg/dL — ABNORMAL HIGH (ref 70–99)

## 2019-10-17 LAB — CBC
HCT: 43.8 % (ref 36.0–46.0)
Hemoglobin: 13.4 g/dL (ref 12.0–15.0)
MCH: 32.3 pg (ref 26.0–34.0)
MCHC: 30.6 g/dL (ref 30.0–36.0)
MCV: 105.5 fL — ABNORMAL HIGH (ref 80.0–100.0)
Platelets: 372 10*3/uL (ref 150–400)
RBC: 4.15 MIL/uL (ref 3.87–5.11)
RDW: 14.4 % (ref 11.5–15.5)
WBC: 10 10*3/uL (ref 4.0–10.5)
nRBC: 0 % (ref 0.0–0.2)

## 2019-10-17 NOTE — Progress Notes (Signed)
Ambulation Note  Saturation Pre: 93% on 4L  Ambulation Distance: bed to recliner  Saturation During Ambulation: 93% on 4L  Notes: Assisted pt from bed to recliner with the help of RN. Assistance x 2 with RW and gait belt. Pt in recliner with call bell within reach and chair alarm set.   Philis Kendall, MS, ACSM CEP 12:21 PM 10/17/2019

## 2019-10-17 NOTE — Progress Notes (Signed)
Inpatient Diabetes Program Recommendations  AACE/ADA: New Consensus Statement on Inpatient Glycemic Control (2015)  Target Ranges:  Prepandial:   less than 140 mg/dL      Peak postprandial:   less than 180 mg/dL (1-2 hours)      Critically ill patients:  140 - 180 mg/dL   Lab Results  Component Value Date   GLUCAP 342 (H) 10/17/2019   HGBA1C 8.3 (H) 10/10/2019   Diabetes history:DM 2 Outpatient Diabetes medications: Metformin [id number] mg bid, Trulicity 1.5 weekly Current orders for Inpatient glycemic control: Novolog moderate tid with meals and HS Decadron 4 mg IV q 24 hours Levemir 25 units q 12 hours Novolog 5 units tid with meals Tradjenta 5 mg daily  Inpatient Diabetes Program Recommendations:    Consider increasing Novolog meal coverage to 8 units tid with meals.   Thanks Adah Perl, RN, BC-ADM Inpatient Diabetes Coordinator Pager [phone]

## 2019-10-17 NOTE — Progress Notes (Signed)
Lisa [name]  [id number] DOB: [birth year] DOA: 10/10/2019 PCP: Kelton Pillar, MD    Brief Narrative:  74yo w/ a hx of Systolic CHF ([id number]), AICD, COPD, HTN, and DM2 who presented w/ SOB and was found to have a sat in the 60s on RA.   Significant Events: 1/11 COVID+ 1/15 admit via [place]  1/16 severe encephalopathy -Neurology consultation -EEG without evidence of seizure    Antimicrobials:  Anti-infectives (From admission, onward)   Start     Dose/Rate Route Frequency Ordered Stop   10/15/19 1500  cefTRIAXone (ROCEPHIN) 1 g in sodium chloride 0.9 % 100 mL IVPB     1 g 200 mL/hr over 30 Minutes Intravenous Every 24 hours 10/15/19 1412 10/20/19 1459   10/11/19 1000  remdesivir 100 mg in sodium chloride 0.9 % 100 mL IVPB     100 mg 200 mL/hr over 30 Minutes Intravenous Daily 10/10/19 1545 10/14/19 1912   10/10/19 1545  remdesivir 200 mg in sodium chloride 0.9% 250 mL IVPB     200 mg 580 mL/hr over 30 Minutes Intravenous Once 10/10/19 1545 10/10/19 1804       Subjective: Patient noted to be much more awake alert.  Calmer this morning.  She apparently had a reasonably good night.  Answering some of my questions.  Still remains distracted.    Assessment & Plan:  Pneumonia due to COVID-19/acute respiratory failure with hypoxia From a respiratory standpoint patient continues to improve.  She is now down to about 2 to 3 L of oxygen saturating in the early 90s.  She still has some tachypnea. She has completed course of remdesivir.  Her steroids were tapered due to encephalopathy.  Her CRP improved.  D-dimer also improved. Continue mobilization, incentive spirometry.  Recent Labs  Lab 10/10/19 1257 10/10/19 1257 10/11/19 0121 10/11/19 0121 10/11/19 0810 10/12/19 0530 10/12/19 0530 10/13/19 0120 10/14/19 0511 10/15/19 0610 10/16/19 0920 10/17/19 0107  DDIMER 1.48*   < > 2.49*  --   --  3.61*  --  2.44* 2.59* 2.64*  --   --   FERRITIN 204   < >  --   --  286 448*   --  520* 481* 302  --   --   CRP 13.0*   < > 13.47*  --   --  21.5*  --  18.4* 8.9* 5.1*  --   --   ALT 18   < > 17   < >  --  16   < > 17 20 20 19 19   PROCALCITON 0.32  --   --   --   --   --   --   --   --   --   --   --    < > = values in this interval not displayed.    Acute metabolic encephalopathy Reason for this is not entirely clear but likely multifactorial including COVID-19, medication effect.  She was apparently given Zyprexa.  B12 and folate acid levels were not low.  TSH was normal.  Ammonia only mildly elevated.  This case was also discussed with neurology who felt that this was likely medication effect.  EEG did not show any seizure activity.  All sedatives were stopped.  Dose of steroid was decreased.  Digoxin level was less than 0.2 on 1/19.[name]  Per family patient does not have any cognitive issues at baseline.  UA noted to be abnormal.  Patient was started on ceftriaxone.  Her mentation continues to improve.  No focal deficits are noted on examination.  Start mobilizing.  Reorient daily.   Urinary tract infection UA was noted to be abnormal.  Patient started on ceftriaxone.  According to daughter patient has frequent UTIs.  Last cultures available from November 2019 which showed E. coli sensitive to ceftriaxone.  Urine culture was ordered however it does not look like it was sent.    Chronic systolic CHF EF is 123456 based on echocardiogram from December.  Followed by cardiology.  Continue ins and outs.  Daily weights.  Does not appear to be overtly fluid overloaded.  Acute kidney injury on chronic kidney disease stage II/hyperkalemia/hypernatremia Creatinine seems to be back to her baseline.  Continue to monitor urine output.  Diuretics being continued at a lower dose.  Hypernatremia is noted and is stable.  Potassium stable at 4.5.    Macrocytosis 123456 folic acid and TSH levels were not abnormal.  Prolonged QTC Continue to monitor electrolytes  Diabetes mellitus type 2,  uncontrolled with hyperglycemia A1c 8.3.  Elevated blood glucose levels likely due to steroids.  Patient on Levemir, SSI.  Continue to monitor.  Adjust insulin dose as needed.  Anticipate improvement as steroid is tapered down.  Hypothyroidism  Continue levothyroxine.    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  Continue to monitor.  Patient on carvedilol, furosemide.   PVD s/p fem/fem bypass Stable  DVT prophylaxis: On Lovenox Code Status: FULL CODE Family Communication: Daughter being updated daily. Disposition Plan: Mobilize when able. PT/OT.  Consultants:  Neurology   Objective: Blood pressure 127/78, pulse 84, temperature 97.6 F (36.4 C), temperature source Oral, resp. rate (!) 21, height 5\' 10"  (1.778 m), weight (!) 177.7 kg, SpO2 94 %.  Intake/Output Summary (Last 24 hours) at 10/17/2019 1130 Last data filed at 10/17/2019 0800 Gross per 24 hour  Intake 1387 ml  Output 1050 ml  Net 337 ml   Filed Weights   10/10/19 1152 10/10/19 2228  Weight: 79.4 kg (!) 177.7 kg    Examination:  General appearance: Much more alert and calm this morning.  Able to answer a few questions.   Resp: Mildly tachypneic.  No use of accessory muscles.  Crackles at the bases.  No wheezing or rhonchi.  Cardio: S1-S2 is normal regular.  No S3-S4.  No rubs murmurs or bruit GI: Abdomen is soft.  Nontender nondistended.  Bowel sounds are present normal.  No masses organomegaly Extremities: No edema.  Moving her extremities. Neurologic: Oriented to place person.  No focal deficits.    CBC: Recent Labs  Lab 10/13/19 0120 10/13/19 0120 10/14/19 0511 10/14/19 0511 10/15/19 0610 10/16/19 0920 10/17/19 0107  WBC 10.7*   < > 9.4   < > 8.8 11.0* 10.0  NEUTROABS 10.0*  --  8.7*  --  8.0*  --   --     HGB 13.2   < > 14.2   < > 14.0 14.1 13.4  HCT 41.3   < > 45.2   < > 45.2 45.5 43.8  MCV 102.0*   < > 104.9*   < > 104.4* 104.4* 105.5*  PLT 271   < > 333   < > 370 392 372   < > = values in this interval not displayed.   Basic Metabolic Panel: Recent Labs  Lab  10/13/19 0120 10/13/19 0120 10/14/19 0511 10/14/19 0511 10/15/19 0610 10/16/19 0920 10/17/19 0107  NA 138   < > 143   < > 143 148* 148*  K 5.5*   < > 5.7*   < > 5.2* 4.7 4.5  CL 102   < > 105   < > 100 104 103  CO2 28   < > 26   < > 33* 36* 35*  GLUCOSE 292*   < > 302*   < > 290* 277* 170*  BUN 29*   < > 40*   < > 42* 38* 39*  CREATININE 1.01*   < > 1.22*   < > 1.14* 1.08* 1.06*  CALCIUM 9.1   < > 9.5   < > 9.5 9.5 9.0  MG 2.8*  --  3.1*  --  2.6*  --   --   PHOS 2.1*  --  3.7  --  4.6  --   --    < > = values in this interval not displayed.   GFR: Estimated Creatinine Clearance: 85 mL/min (A) (by C-G formula based on SCr of 1.06 mg/dL (H)).  Liver Function Tests: Recent Labs  Lab 10/14/19 0511 10/15/19 0610 10/16/19 0920 10/17/19 0107  AST 29 24 24 19   ALT 20 20 19 19   ALKPHOS 66 65 72 63  BILITOT 0.6 0.7 0.6 0.6  PROT 8.3* 8.1 7.9 7.1  ALBUMIN 3.5 3.4* 3.3* 3.0*    HbA1C: Hgb A1c MFr Bld  Date/Time Value Ref Range Status  10/10/2019 05:10 PM 8.3 (H) 4.8 - 5.6 % Final    Comment:    (NOTE) Pre diabetes:          5.7%-6.4% Diabetes:              >6.4% Glycemic control for   <7.0% adults with diabetes   03/19/2018 04:00 AM 7.0 (H) 4.8 - 5.6 % Final    Comment:    (NOTE) Pre diabetes:          5.7%-6.4% Diabetes:              >6.4% Glycemic control for   <7.0% adults with diabetes     CBG: Recent Labs  Lab 10/16/19 1206 10/16/19 1652 10/16/19 2106 10/17/19 0127 10/17/19 0732  GLUCAP 371* 323* 228* 179* 233*    Recent Results (from the past 240 hour(s))  SARS CORONAVIRUS 2 (TAT 6-24 HRS) Nasopharyngeal Nasopharyngeal Swab     Status: Abnormal   Collection Time: 10/10/19 12:38 PM    Specimen: Nasopharyngeal Swab  Result Value Ref Range Status   SARS Coronavirus 2 POSITIVE (A) NEGATIVE Final    Comment: RESULT CALLED TO, READ BACK BY AND VERIFIED WITH: A.OLEARY RN 2147 10/10/19 MCCORMICK K  (NOTE) SARS-CoV-2 target nucleic acids are DETECTED. The SARS-CoV-2 RNA is generally detectable in upper and lower respiratory specimens during the acute phase of infection. Positive results are indicative of the presence of SARS-CoV-2 RNA. Clinical correlation with patient history and other diagnostic information is  necessary to determine patient infection status. Positive results do not rule out bacterial infection or co-infection with other viruses.  The expected result is Negative. Fact Sheet for Patients: SugarRoll.be Fact Sheet for Healthcare Providers: https://www.woods-mathews.com/ This test is not yet approved or cleared by the [place] FDA and  has been authorized for detection and/or diagnosis of SARS-CoV-2 by FDA under an Emergency Use Authorization (EUA). This EUA will remain  in effect (meaning this test can  be used) for  the duration of the COVID-19 declaration under Section 564(b)(1) of the Act, 21 U.S.C. section 360bbb-3(b)(1), unless the authorization is terminated or revoked sooner. Performed at [facility], [place] [address], [place], [place] [address]   Blood Culture (routine x 2)     Status: None   Collection Time: 10/10/19  2:48 PM   Specimen: BLOOD  Result Value Ref Range Status   Specimen Description BLOOD RIGHT ANTECUBITAL  Final   Special Requests   Final    BOTTLES DRAWN AEROBIC AND ANAEROBIC Blood Culture adequate volume   Culture   Final    NO GROWTH 5 DAYS Performed at [facility], [place] [address] [place], [place] [address]    Report Status 10/15/2019 FINAL  Final  Blood Culture (routine x 2)     Status: None   Collection Time: 10/10/19  5:10 PM   Specimen: BLOOD RIGHT HAND    Result Value Ref Range Status   Specimen Description BLOOD RIGHT HAND  Final   Special Requests   Final    BOTTLES DRAWN AEROBIC AND ANAEROBIC Blood Culture adequate volume   Culture   Final    NO GROWTH 5 DAYS Performed at [facility], [place] [address], [place], [place] [address]    Report Status 10/15/2019 FINAL  Final     Radiology  DG Chest Port 1 View  Result Date: 10/14/2019 CLINICAL DATA:  COVID-19 pneumonia. EXAM: PORTABLE CHEST 1 VIEW COMPARISON:  One-view chest x-ray 10/10/2019 FINDINGS: Heart is enlarged. Pacing and defibrillator wires are stable. Atherosclerotic changes are noted at the aortic arch. Bilateral interstitial and airspace opacities are again seen. There is progressive disease in the right upper lobe and lower lobe. Left pleural effusion is suspected. IMPRESSION: Progressive interstitial and airspace disease in the right upper lobe and lower lobe compatible with multi lobar pneumonia. Electronically Signed   By: San Morelle M.D.   On: 10/14/2019 06:42     Scheduled Meds: . allopurinol  100 mg Oral Daily  . aspirin EC  162 mg Oral Daily  . atorvastatin  20 mg Oral q1800  . carvedilol  12.5 mg Oral BID WC  . Chlorhexidine Gluconate Cloth  6 each Topical Daily  . dexamethasone (DECADRON) injection  4 mg Intravenous Q24H  . digoxin  0.0625 mg Oral Daily  . enoxaparin (LOVENOX) injection  90 mg Subcutaneous Q24H  . fluticasone furoate-vilanterol  1 puff Inhalation Daily  . furosemide  40 mg Intravenous Daily  . insulin aspart  0-15 Units Subcutaneous TID WC  . insulin aspart  0-5 Units Subcutaneous QHS  . insulin aspart  5 Units Subcutaneous TID WC  . insulin detemir  25 Units Subcutaneous BID  . levothyroxine  25 mcg Oral Q0600  . linagliptin  5 mg Oral Daily  . pantoprazole  40 mg Oral Daily  . pregabalin  100 mg Oral QPM   Continuous Infusions: . cefTRIAXone (ROCEPHIN)  IV 1 g (10/16/19 1641)     LOS: 7 days   [facility]  [facility] Office  [phone] Pager - Text Page per Shea Evans  If 7PM-7AM, please contact night-coverage per Amion 10/17/2019, 11:30 AM

## 2019-10-18 LAB — GLUCOSE, CAPILLARY
Glucose-Capillary: 264 mg/dL — ABNORMAL HIGH (ref 70–99)
Glucose-Capillary: 378 mg/dL — ABNORMAL HIGH (ref 70–99)
Glucose-Capillary: 376 mg/dL — ABNORMAL HIGH (ref 70–99)
Glucose-Capillary: 192 mg/dL — ABNORMAL HIGH (ref 70–99)

## 2019-10-18 LAB — CBC
HCT: 41.6 % (ref 36.0–46.0)
Hemoglobin: 12.9 g/dL (ref 12.0–15.0)
MCH: 32.2 pg (ref 26.0–34.0)
MCHC: 31 g/dL (ref 30.0–36.0)
MCV: 103.7 fL — ABNORMAL HIGH (ref 80.0–100.0)
Platelets: 410 10*3/uL — ABNORMAL HIGH (ref 150–400)
RBC: 4.01 MIL/uL (ref 3.87–5.11)
RDW: 14.3 % (ref 11.5–15.5)
WBC: 10.6 10*3/uL — ABNORMAL HIGH (ref 4.0–10.5)
nRBC: 0.3 % — ABNORMAL HIGH (ref 0.0–0.2)

## 2019-10-18 LAB — COMPREHENSIVE METABOLIC PANEL
ALT: 18 U/L (ref 0–44)
AST: 16 U/L (ref 15–41)
Albumin: 2.8 g/dL — ABNORMAL LOW (ref 3.5–5.0)
Alkaline Phosphatase: 69 U/L (ref 38–126)
Anion gap: 9 (ref 5–15)
BUN: 37 mg/dL — ABNORMAL HIGH (ref 8–23)
CO2: 35 mmol/L — ABNORMAL HIGH (ref 22–32)
Calcium: 8.9 mg/dL (ref 8.9–10.3)
Chloride: 94 mmol/L — ABNORMAL LOW (ref 98–111)
Creatinine, Ser: 1.01 mg/dL — ABNORMAL HIGH (ref 0.44–1.00)
GFR calc Af Amer: >60 mL/min (ref >60)
GFR calc non Af Amer: 56 mL/min — ABNORMAL LOW (ref >60)
Glucose, Bld: 294 mg/dL — ABNORMAL HIGH (ref 70–99)
Potassium: 4.6 mmol/L (ref 3.5–5.1)
Sodium: 138 mmol/L (ref 135–145)
Total Bilirubin: 0.6 mg/dL (ref 0.3–1.2)
Total Protein: 7.3 g/dL (ref 6.5–8.1)

## 2019-10-18 MED ORDER — INSULIN DETEMIR 100 UNIT/ML ~~LOC~~ SOLN
30.0000 [IU] | Freq: Two times a day (BID) | SUBCUTANEOUS | Status: DC
  Administered 2019-10-18 – 2019-10-19 (×2): 30 [IU] via SUBCUTANEOUS
  Filled 2019-10-18 (×3): qty 0.3

## 2019-10-18 MED ORDER — DEXAMETHASONE SODIUM PHOSPHATE 4 MG/ML IJ SOLN
2.0000 mg | INTRAMUSCULAR | Status: DC
  Administered 2019-10-18 – 2019-10-19 (×2): 2 mg via INTRAVENOUS
  Filled 2019-10-18 (×2): qty 1

## 2019-10-18 MED ORDER — SODIUM CHLORIDE 0.9 % IV BOLUS
500.0000 mL | Freq: Once | INTRAVENOUS | Status: AC
  Administered 2019-10-18: 500 mL via INTRAVENOUS

## 2019-10-18 MED ORDER — TORSEMIDE 20 MG PO TABS
20.0000 mg | ORAL_TABLET | Freq: Every day | ORAL | Status: DC
  Administered 2019-10-19: 10:00:00 20 mg via ORAL
  Filled 2019-10-18: qty 1

## 2019-10-18 MED ORDER — INSULIN ASPART 100 UNIT/ML ~~LOC~~ SOLN
8.0000 [IU] | Freq: Three times a day (TID) | SUBCUTANEOUS | Status: DC
  Administered 2019-10-18 – 2019-10-20 (×8): 8 [IU] via SUBCUTANEOUS

## 2019-10-18 MED ORDER — MENTHOL 3 MG MT LOZG
1.0000 | LOZENGE | OROMUCOSAL | Status: DC | PRN
  Administered 2019-10-20: 3 mg via ORAL
  Filled 2019-10-18: qty 9

## 2019-10-18 MED ORDER — CARVEDILOL 3.125 MG PO TABS
6.2500 mg | ORAL_TABLET | Freq: Two times a day (BID) | ORAL | Status: DC
  Filled 2019-10-18: qty 2

## 2019-10-18 NOTE — Progress Notes (Signed)
1200 Facilitated video chat with pt's daughters and a family friend.   1500 Called pt's daughter and updated her to the pt's day. We discussed possibilities at discharge, including that the pt would not consider rehab. The family supports this decision and is confident that they will be able to care for the pt safely at home once she is discharged.

## 2019-10-18 NOTE — Progress Notes (Signed)
Pt B/P 98/49 HR 84.  Pt has had a run of V-Tach 12 beats.  Pt states no adverse s/s.  Currently running a bolus of 500 mls NS at a rate of 250 mls/hr, an order that was put in during day shift but not given due to loss of working IV.  Pt currentlyt sitting in her chair talking on the phone to her daughter.

## 2019-10-18 NOTE — Progress Notes (Addendum)
Lisa [name]  [id number] DOB: 04-09-1946 DOA: 10/10/2019 PCP: Kelton Pillar, MD    Brief Narrative:  74yo w/ a hx of Systolic CHF ([id number]), AICD, COPD, HTN, and DM2 who presented w/ SOB and was found to have a sat in the 60s on RA.   Significant Events: 1/11 COVID+ 1/15 admit via [facility]  1/16 severe encephalopathy -Neurology consultation -EEG without evidence of seizure    Antimicrobials:  Anti-infectives (From admission, onward)   Start     Dose/Rate Route Frequency Ordered Stop   10/15/19 1500  cefTRIAXone (ROCEPHIN) 1 g in sodium chloride 0.9 % 100 mL IVPB     1 g 200 mL/hr over 30 Minutes Intravenous Every 24 hours 10/15/19 1412 10/20/19 1459   10/11/19 1000  remdesivir 100 mg in sodium chloride 0.9 % 100 mL IVPB     100 mg 200 mL/hr over 30 Minutes Intravenous Daily 10/10/19 1545 10/14/19 1912   10/10/19 1545  remdesivir 200 mg in sodium chloride 0.9% 250 mL IVPB     200 mg 580 mL/hr over 30 Minutes Intravenous Once 10/10/19 1545 10/10/19 1804       Subjective: Patient asking to go home.  States that she is feeling better.  Still distracted but was able to answer all my questions.  Denies any pain.  Denies any shortness of breath this morning.    Assessment & Plan:  Pneumonia due to COVID-19/acute respiratory failure with hypoxia Patient seems to have improved from a respiratory standpoint.  She is down to 2 L of oxygen by nasal cannula.  Saturating in the mid 90s.  Her inflammatory markers had improved.  She has completed course of remdesivir.  Steroids being tapered down.  Continue mobilization, incentive spirometry.  PT and OT to evaluate.    Recent Labs  Lab 10/12/19 0530 10/12/19 0530 10/13/19 0120 10/13/19 0120 10/14/19 0511 10/15/19 0610 10/16/19 0920 10/17/19 0107 10/18/19 0048  DDIMER 3.61*  --  2.44*  --  2.59* 2.64*  --   --   --   FERRITIN 448*  --  520*  --  481* 302  --   --   --   CRP 21.5*  --  18.4*  --  8.9* 5.1*  --   --   --     ALT 16   < > 17   < > 20 20 19 19 18    < > = values in this interval not displayed.    Acute metabolic encephalopathy Reason for this was not entirely clear but likely multifactorial including COVID-19, medication effect.  She was apparently given Zyprexa.  B12 and folate acid levels were not low.  TSH was normal.  Ammonia only mildly elevated.  This case was also discussed with neurology who felt that this was likely medication effect.  EEG did not show any seizure activity.  All sedatives were stopped.  Dose of steroid was decreased.  Digoxin level was less than 0.2 on 1/19.[name]  Per family patient does not have any cognitive issues at baseline.  UA noted to be abnormal.  Patient was started on ceftriaxone.   Patient's mental status continues to improve.  She is likely close to her baseline.  Still a little distracted.  No focal deficits.  Start mobilizing.   Urinary tract infection UA was noted to be abnormal.  Patient started on ceftriaxone.  According to daughter patient has frequent UTIs.  Last cultures available from November 2019 which showed E. coli sensitive  to ceftriaxone.  Urine culture was ordered however it does not look like it was sent.  Complete a 5-day course of ceftriaxone.  Chronic systolic CHF EF is 123456 based on echocardiogram from December.  Followed by cardiology.  Continue ins and outs.  Daily weights.  Does not appear to be overtly fluid overloaded.  She remains on IV Lasix.  She is developing some contraction alkalosis.  Back to her torsemide from tomorrow.  Acute kidney injury on chronic kidney disease stage II/hyperkalemia/hypernatremia Creatinine seems to be back to her baseline.  Continue to monitor urine output.  Remains on diuretics.  Sodium level improved this morning.  Potassium stable at 4.6.     Diabetes mellitus type 2, uncontrolled with hyperglycemia A1c 8.3.  CBGs have significantly worsened over the last 48 hours.  Continue to taper down steroids.  Increase  dose of Levemir.  Continue SSI.  Add meal coverage.  is tapered down.  Macrocytosis 123456 folic acid and TSH levels were not abnormal.  Prolonged QTC Continue to monitor electrolytes  Hypothyroidism  Continue levothyroxine.    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  Continue to monitor.  Patient on carvedilol, furosemide.   PVD s/p fem/fem bypass Stable  DVT prophylaxis: On Lovenox Code Status: FULL CODE Family Communication: Daughter being updated daily. Disposition Plan: Will likely need some form of rehab on discharge.  Will need to discuss with family.  Consultants:  Neurology   Objective: Blood pressure 104/60, pulse 68, temperature 98.5 F (36.9 C), temperature source Oral, resp. rate (!) 29, height 5\' 10"  (1.778 m), weight (!) 177.7 kg, SpO2 95 %.  Intake/Output Summary (Last 24 hours) at 10/18/2019 1109 Last data filed at 10/17/2019 2300 Gross per 24 hour  Intake --  Output 250 ml  Net -250 ml   Filed Weights   10/10/19 1152 10/10/19 2228  Weight: 79.4 kg (!) 177.7 kg    Examination:  General appearance: Awake alert.  In no distress.  Mildly distracted.  Calmer.  Following commands. Resp: Mildly tachypneic.  No use of accessory muscles few crackles at the bases.  Improved air entry.  No wheezing or rhonchi.   Cardio: S1-S2 is normal regular.  No S3-S4.  No rubs murmurs or bruit GI: Abdomen is soft.  Nontender nondistended.  Bowel sounds are present normal.  No masses organomegaly Extremities: No edema.  Full range of motion of lower extremities. Neurologic: Awake alert.  Oriented to place person was able to tell me the year.  No obvious focal deficits.    CBC: Recent Labs  Lab 10/13/19 0120 10/13/19 0120 10/14/19 0511 10/14/19 0511 10/15/19 0610  10/15/19 0610 10/16/19 0920 10/17/19 0107 10/18/19 0048  WBC 10.7*   < > 9.4   < > 8.8   < > 11.0* 10.0 10.6*  NEUTROABS 10.0*  --  8.7*  --  8.0*  --   --   --   --   HGB 13.2   < > 14.2   < > 14.0   < > 14.1 13.4 12.9  HCT 41.3   < > 45.2   < > 45.2   < > 45.5 43.8 41.6  MCV 102.0*   < > 104.9*   < > 104.4*   < > 104.4* 105.5* 103.7*  PLT 271   < > 333   < >  370   < > 392 372 410*   < > = values in this interval not displayed.   Basic Metabolic Panel: Recent Labs  Lab 10/13/19 0120 10/13/19 0120 10/14/19 0511 10/14/19 0511 10/15/19 0610 10/15/19 0610 10/16/19 0920 10/17/19 0107 10/18/19 0048  NA 138   < > 143   < > 143   < > 148* 148* 138  K 5.5*   < > 5.7*   < > 5.2*   < > 4.7 4.5 4.6  CL 102   < > 105   < > 100   < > 104 103 94*  CO2 28   < > 26   < > 33*   < > 36* 35* 35*  GLUCOSE 292*   < > 302*   < > 290*   < > 277* 170* 294*  BUN 29*   < > 40*   < > 42*   < > 38* 39* 37*  CREATININE 1.01*   < > 1.22*   < > 1.14*   < > 1.08* 1.06* 1.01*  CALCIUM 9.1   < > 9.5   < > 9.5   < > 9.5 9.0 8.9  MG 2.8*  --  3.1*  --  2.6*  --   --   --   --   PHOS 2.1*  --  3.7  --  4.6  --   --   --   --    < > = values in this interval not displayed.   GFR: Estimated Creatinine Clearance: 89.2 mL/min (A) (by C-G formula based on SCr of 1.01 mg/dL (H)).  Liver Function Tests: Recent Labs  Lab 10/15/19 0610 10/16/19 0920 10/17/19 0107 10/18/19 0048  AST 24 24 19 16   ALT 20 19 19 18   ALKPHOS 65 72 63 69  BILITOT 0.7 0.6 0.6 0.6  PROT 8.1 7.9 7.1 7.3  ALBUMIN 3.4* 3.3* 3.0* 2.8*    HbA1C: Hgb A1c MFr Bld  Date/Time Value Ref Range Status  10/10/2019 05:10 PM 8.3 (H) 4.8 - 5.6 % Final    Comment:    (NOTE) Pre diabetes:          5.7%-6.4% Diabetes:              >6.4% Glycemic control for   <7.0% adults with diabetes   03/19/2018 04:00 AM 7.0 (H) 4.8 - 5.6 % Final    Comment:    (NOTE) Pre diabetes:          5.7%-6.4% Diabetes:              >6.4% Glycemic control for    <7.0% adults with diabetes     CBG: Recent Labs  Lab 10/17/19 0127 10/17/19 0732 10/17/19 1124 10/17/19 1923 10/18/19 0743  GLUCAP 179* 233* 342* 457* 264*    Recent Results (from the past 240 hour(s))  SARS CORONAVIRUS 2 (TAT 6-24 HRS) Nasopharyngeal Nasopharyngeal Swab     Status: Abnormal   Collection Time: 10/10/19 12:38 PM   Specimen: Nasopharyngeal Swab  Result Value Ref Range Status   SARS Coronavirus 2 POSITIVE (A) NEGATIVE Final    Comment: RESULT CALLED TO, READ BACK BY AND VERIFIED WITH: A.OLEARY RN 2147 10/10/19 MCCORMICK K  (NOTE) SARS-CoV-2 target nucleic acids are DETECTED. The SARS-CoV-2 RNA is generally detectable in upper and lower respiratory specimens during the acute phase of infection. Positive results are indicative of the presence of SARS-CoV-2 RNA. Clinical correlation with patient history and  other diagnostic information is  necessary to determine patient infection status. Positive results do not rule out bacterial infection or co-infection with other viruses.  The expected result is Negative. Fact Sheet for Patients: SugarRoll.be Fact Sheet for Healthcare Providers: https://www.woods-mathews.com/ This test is not yet approved or cleared by the [place] FDA and  has been authorized for detection and/or diagnosis of SARS-CoV-2 by FDA under an Emergency Use Authorization (EUA). This EUA will remain  in effect (meaning this test can be used) for  the duration of the COVID-19 declaration under Section 564(b)(1) of the Act, 21 U.S.C. section 360bbb-3(b)(1), unless the authorization is terminated or revoked sooner. Performed at [facility], [place] [address] [place], [place] [address]   Blood Culture (routine x 2)     Status: None   Collection Time: 10/10/19  2:48 PM   Specimen: BLOOD  Result Value Ref Range Status   Specimen Description BLOOD RIGHT ANTECUBITAL  Final   Special Requests   Final     BOTTLES DRAWN AEROBIC AND ANAEROBIC Blood Culture adequate volume   Culture   Final    NO GROWTH 5 DAYS Performed at [facility], [place] [address] [place], [place] [address]    Report Status 10/15/2019 FINAL  Final  Blood Culture (routine x 2)     Status: None   Collection Time: 10/10/19  5:10 PM   Specimen: BLOOD RIGHT HAND  Result Value Ref Range Status   Specimen Description BLOOD RIGHT HAND  Final   Special Requests   Final    BOTTLES DRAWN AEROBIC AND ANAEROBIC Blood Culture adequate volume   Culture   Final    NO GROWTH 5 DAYS Performed at [facility], [place] [address], [place], [place] [address]    Report Status 10/15/2019 FINAL  Final     Radiology  No results found.   Scheduled Meds: . allopurinol  100 mg Oral Daily  . aspirin EC  162 mg Oral Daily  . atorvastatin  20 mg Oral q1800  . carvedilol  12.5 mg Oral BID WC  . Chlorhexidine Gluconate Cloth  6 each Topical Daily  . dexamethasone (DECADRON) injection  4 mg Intravenous Q24H  . digoxin  0.0625 mg Oral Daily  . enoxaparin (LOVENOX) injection  90 mg Subcutaneous Q24H  . fluticasone furoate-vilanterol  1 puff Inhalation Daily  . furosemide  40 mg Intravenous Daily  . insulin aspart  0-15 Units Subcutaneous TID WC  . insulin aspart  0-5 Units Subcutaneous QHS  . insulin aspart  5 Units Subcutaneous TID WC  . insulin detemir  25 Units Subcutaneous BID  . levothyroxine  25 mcg Oral Q0600  . linagliptin  5 mg Oral Daily  . pantoprazole  40 mg Oral Daily  . pregabalin  100 mg Oral QPM   Continuous Infusions: . cefTRIAXone (ROCEPHIN)  IV 1 g (10/17/19 1417)     LOS: 8 days   [place] [facility] Office  [phone] Pager - Text Page per Shea Evans  If 7PM-7AM, please contact night-coverage per Amion 10/18/2019, 11:09 AM

## 2019-10-18 NOTE — Progress Notes (Signed)
Physical Therapy Treatment Patient Details Name: Lisa [name] MRN: [id number] DOB: 11-Nov-1945 Today's Date: 10/18/2019    History of Present Illness 74 y/o female w/ PMHx; PNA, PAD, non ischemic cardiomyopathy, neuropathy, mild CAD, HTN, HLD, GERD, former tobacco use, dyspnea, DM, COPD, chronic systolic CHF, Bell's Palsy, arthritis, AICD, B joint replacements, COVID + 1/11    PT Comments    Pt with significant improvement in mobility and cognition. Able to amb with walker and min guard assist. Pt refuses SNF and wants to return home where she reports family will be available to provide 24 hour assist. Pt will need assist initially with any mobility OOB. Expect she will progress rapidly. Will also need HHPT and home O2.    Follow Up Recommendations  Supervision/Assistance - 24 hour;Home health PT(pt refuses SNF)     Equipment Recommendations  None recommended by PT    Recommendations for Other Services       Precautions / Restrictions Precautions Precautions: Fall Restrictions Weight Bearing Restrictions: No    Mobility  Bed Mobility Overal bed mobility: Needs Assistance Bed Mobility: Sidelying to Sit   Sidelying to sit: Supervision       General bed mobility comments: supervision for lines  Transfers Overall transfer level: Needs assistance Equipment used: Rolling walker (2 wheeled) Transfers: Sit to/from Stand Sit to Stand: Min guard         General transfer comment: Assist for safety/lines  Ambulation/Gait Ambulation/Gait assistance: Min guard Gait Distance (Feet): 80 Feet Assistive device: Rolling walker (2 wheeled) Gait Pattern/deviations: Step-through pattern;Decreased stride length Gait velocity: decr Gait velocity interpretation: 1.31 - 2.62 ft/sec, indicative of limited community ambulator General Gait Details: assist for safety and lines   Stairs             Wheelchair Mobility    Modified Rankin (Stroke Patients Only)        Balance Overall balance assessment: Needs assistance Sitting-balance support: Feet supported;No upper extremity supported Sitting balance-Leahy Scale: Good     Standing balance support: Bilateral upper extremity supported Standing balance-Leahy Scale: Poor Standing balance comment: walker and supervision for static standing                            Cognition Arousal/Alertness: Awake/alert Behavior During Therapy: WFL for tasks assessed/performed Overall Cognitive Status: Within Functional Limits for tasks assessed                                        Exercises      General Comments General comments (skin integrity, edema, etc.): Pt on 2L O2 with SpO2 93% with amb. On RA at rest pt to 86%      Pertinent Vitals/Pain Pain Assessment: No/denies pain    Home Living                      Prior Function            PT Goals (current goals can now be found in the care plan section) Acute Rehab PT Goals Patient Stated Goal: go home Time For Goal Achievement: 10/25/19 Potential to Achieve Goals: Good Progress towards PT goals: Goals met and updated - see care plan    Frequency    Min 3X/week      PT Plan Discharge plan needs to be updated    Co-evaluation  AM-PAC PT "6 Clicks" Mobility   Outcome Measure  Help needed turning from your back to your side while in a flat bed without using bedrails?: None Help needed moving from lying on your back to sitting on the side of a flat bed without using bedrails?: None Help needed moving to and from a bed to a chair (including a wheelchair)?: A Little Help needed standing up from a chair using your arms (e.g., wheelchair or bedside chair)?: A Little Help needed to walk in hospital room?: A Little Help needed climbing 3-5 steps with a railing? : A Little 6 Click Score: 20    End of Session Equipment Utilized During Treatment: Oxygen;Gait belt Activity Tolerance:  Patient tolerated treatment well Patient left: with call bell/phone within reach;in chair;with chair alarm set Nurse Communication: Mobility status PT Visit Diagnosis: Other abnormalities of gait and mobility (R26.89)     Time: 8325-4982 PT Time Calculation (min) (ACUTE ONLY): 28 min  Charges:  $Gait Training: 23-37 mins                     [place] Pager [phone] Office [place] 10/18/2019, 2:10 PM

## 2019-10-18 NOTE — Progress Notes (Signed)
SATURATION QUALIFICATIONS: (This note is used to comply with regulatory documentation for home oxygen)  Patient Saturations on Room Air at Rest = 86%  Patient Saturations on Room Air while Ambulating = NT due to decr SpO2 at rest on room air.  Patient Saturations on 2 Liters of oxygen while Ambulating = 93%  Please briefly explain why patient needs home oxygen:Needs supplemental O2 at rest and with activity to maintain adequate oxygenation.   [place] Pager [phone] Office [phone]

## 2019-10-19 ENCOUNTER — Other Ambulatory Visit: Payer: Self-pay

## 2019-10-19 LAB — COMPREHENSIVE METABOLIC PANEL
ALT: 19 U/L (ref 0–44)
AST: 18 U/L (ref 15–41)
Albumin: 3 g/dL — ABNORMAL LOW (ref 3.5–5.0)
Alkaline Phosphatase: 76 U/L (ref 38–126)
Anion gap: 11 (ref 5–15)
BUN: 32 mg/dL — ABNORMAL HIGH (ref 8–23)
CO2: 32 mmol/L (ref 22–32)
Calcium: 8.8 mg/dL — ABNORMAL LOW (ref 8.9–10.3)
Chloride: 94 mmol/L — ABNORMAL LOW (ref 98–111)
Creatinine, Ser: 0.98 mg/dL (ref 0.44–1.00)
GFR calc Af Amer: >60 mL/min (ref >60)
GFR calc non Af Amer: 58 mL/min — ABNORMAL LOW (ref >60)
Glucose, Bld: 360 mg/dL — ABNORMAL HIGH (ref 70–99)
Potassium: 4.9 mmol/L (ref 3.5–5.1)
Sodium: 137 mmol/L (ref 135–145)
Total Bilirubin: 0.5 mg/dL (ref 0.3–1.2)
Total Protein: 7.9 g/dL (ref 6.5–8.1)

## 2019-10-19 LAB — CBC
HCT: 43.1 % (ref 36.0–46.0)
Hemoglobin: 13.2 g/dL (ref 12.0–15.0)
MCH: 32 pg (ref 26.0–34.0)
MCHC: 30.6 g/dL (ref 30.0–36.0)
MCV: 104.4 fL — ABNORMAL HIGH (ref 80.0–100.0)
Platelets: 444 10*3/uL — ABNORMAL HIGH (ref 150–400)
RBC: 4.13 MIL/uL (ref 3.87–5.11)
RDW: 14.5 % (ref 11.5–15.5)
WBC: 8.8 10*3/uL (ref 4.0–10.5)
nRBC: 0 % (ref 0.0–0.2)

## 2019-10-19 LAB — GLUCOSE, CAPILLARY
Glucose-Capillary: 422 mg/dL — ABNORMAL HIGH (ref 70–99)
Glucose-Capillary: 302 mg/dL — ABNORMAL HIGH (ref 70–99)
Glucose-Capillary: 383 mg/dL — ABNORMAL HIGH (ref 70–99)
Glucose-Capillary: 428 mg/dL — ABNORMAL HIGH (ref 70–99)
Glucose-Capillary: 309 mg/dL — ABNORMAL HIGH (ref 70–99)

## 2019-10-19 LAB — MAGNESIUM: Magnesium: 2.6 mg/dL — ABNORMAL HIGH (ref 1.7–2.4)

## 2019-10-19 MED ORDER — GUAIFENESIN 100 MG/5ML PO SOLN
5.0000 mL | ORAL | Status: DC | PRN
  Filled 2019-10-19: qty 5

## 2019-10-19 MED ORDER — INSULIN DETEMIR 100 UNIT/ML ~~LOC~~ SOLN
35.0000 [IU] | Freq: Two times a day (BID) | SUBCUTANEOUS | Status: DC
  Administered 2019-10-19 – 2019-10-20 (×2): 35 [IU] via SUBCUTANEOUS
  Filled 2019-10-19 (×3): qty 0.35

## 2019-10-19 MED ORDER — GUAIFENESIN ER 600 MG PO TB12
1200.0000 mg | ORAL_TABLET | Freq: Two times a day (BID) | ORAL | Status: DC
  Administered 2019-10-19 – 2019-10-20 (×3): 1200 mg via ORAL
  Filled 2019-10-19 (×3): qty 2

## 2019-10-19 MED ORDER — CARVEDILOL 3.125 MG PO TABS: 3.1250 mg | ORAL_TABLET | Freq: Two times a day (BID) | ORAL | Status: DC

## 2019-10-19 MED ORDER — SODIUM CHLORIDE 0.9 % IV BOLUS
500.0000 mL | Freq: Once | INTRAVENOUS | Status: AC
  Administered 2019-10-19: 17:00:00 500 mL via INTRAVENOUS

## 2019-10-19 MED ORDER — INSULIN DETEMIR 100 UNIT/ML ~~LOC~~ SOLN
5.0000 [IU] | Freq: Once | SUBCUTANEOUS | Status: AC
  Administered 2019-10-19: 5 [IU] via SUBCUTANEOUS
  Filled 2019-10-19: qty 0.05

## 2019-10-19 NOTE — Progress Notes (Signed)
Lisa [name]  [id number] DOB: Jun 06, 1946 DOA: 10/10/2019 PCP: Kelton Pillar, MD    Brief Narrative:  74yo w/ a hx of Systolic CHF ([id number]), AICD, COPD, HTN, and DM2 who presented w/ SOB and was found to have a sat in the 60s on RA.   Significant Events: 1/11 COVID+ 1/15 admit via [place]  1/16 severe encephalopathy -Neurology consultation -EEG without evidence of seizure    Antimicrobials:  Anti-infectives (From admission, onward)   Start     Dose/Rate Route Frequency Ordered Stop   10/15/19 1500  cefTRIAXone (ROCEPHIN) 1 g in sodium chloride 0.9 % 100 mL IVPB     1 g 200 mL/hr over 30 Minutes Intravenous Every 24 hours 10/15/19 1412 10/18/19 1650   10/11/19 1000  remdesivir 100 mg in sodium chloride 0.9 % 100 mL IVPB     100 mg 200 mL/hr over 30 Minutes Intravenous Daily 10/10/19 1545 10/14/19 1912   10/10/19 1545  remdesivir 200 mg in sodium chloride 0.9% 250 mL IVPB     200 mg 580 mL/hr over 30 Minutes Intravenous Once 10/10/19 1545 10/10/19 1804       Subjective: No overnight events per nursing staff.  Patient states that she is feeling well.  Still noted to be distracted.  Wants to go home.  Shortness of breath is improving.     Assessment & Plan:  Pneumonia due to COVID-19/acute respiratory failure with hypoxia Patient appears to be improved from a respiratory standpoint.  She is requiring 1 to 3 L of oxygen saturating in the 90s.  Inflammatory markers have improved.  She completed course of remdesivir.  Steroids being tapered down.  Continue mobilization and incentive spirometry.  Seen by PT and OT.  Patient declines rehab.  Family will be able to provide adequate support at home.  Home health will be ordered.  She will likely need home oxygen also.    Acute metabolic encephalopathy Reason for this was not entirely clear but likely multifactorial including COVID-19, medication effect.  She was apparently given Zyprexa.  B12 and folate acid levels were not  low.  TSH was normal.  Ammonia only mildly elevated.  This case was also discussed with neurology who felt that this was likely medication effect.  EEG did not show any seizure activity.  All sedatives were stopped.  Dose of steroid was decreased.  Digoxin level was less than 0.2 on 1/19.Lisa [name]  Per family patient does not have any cognitive issues at baseline.  UA noted to be abnormal.  Patient was started on ceftriaxone.   Patient mental status continues to improve.  Still remains distracted.  No focal deficits.  Continue to mobilize.    Hypotension Yesterday she was noted to have low blood pressure.  She was not symptomatic.  This was after she was gotten out of bed for the first time in about a week.  Likely due to deconditioning.  Dose of her carvedilol was decreased.  Continue to monitor.  Urinary tract infection UA was noted to be abnormal. According to daughter patient has frequent UTIs.  Last cultures available from November 2019 which showed E. coli sensitive to ceftriaxone.  Urine culture was ordered however not sent.   Completed 5-day course of ceftriaxone.  Chronic systolic CHF EF is 123456 based on echocardiogram from December.  Followed by cardiology.  Seems to be euvolemic for the most part.  Started back on her torsemide.  She was getting IV Lasix previously.  Acute kidney injury on chronic kidney disease stage II/hyperkalemia/hypernatremia Creatinine seems to be back to her baseline.  Continue to monitor urine output.  Remains on diuretics.  Electrolytes are stable.     Diabetes mellitus type 2, uncontrolled with hyperglycemia A1c 8.3.  CBGs have worsened in the last 2 to 3 days most likely due to improved oral intake.  Continue to taper down steroids.  Adjust dose of Levemir.  Continue SSI.    Macrocytosis 123456 folic acid and TSH levels were not abnormal.  Prolonged QTC Continue to monitor electrolytes  Hypothyroidism  Continue levothyroxine.    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  PVD s/p fem/fem bypass Stable  DVT prophylaxis: On Lovenox Code Status: FULL CODE Family Communication: Daughter being updated daily. Disposition Plan: Home health when ready for discharge.  Consultants:  Neurology   Objective: Blood pressure 95/63, pulse 94, temperature 98.5 F (36.9 C), temperature source Oral, resp. rate (!) 28, height 5\' 10"  (1.778 m), weight (!) 177.7 kg, SpO2 95 %.  Intake/Output Summary (Last 24 hours) at 10/19/2019 1201 Last data filed at 10/19/2019 0800 Gross per 24 hour  Intake 581.78 ml  Output 500 ml  Net 81.78 ml   Filed Weights   10/10/19 1152 10/10/19 2228  Weight: 79.4 kg (!) 177.7 kg    Examination:  General appearance: Awake alert.  In no distress.  Mildly distracted.  Following commands. Resp: Improved air entry.  Normal effort at rest.  Coarse breath sounds with few crackles at the bases.  No wheezing or rhonchi.   Cardio: S1-S2 is normal regular.  No S3-S4.  No rubs murmurs or bruit GI: Abdomen is soft.  Nontender nondistended.  Bowel sounds are present normal.  No masses organomegaly Extremities: No edema.  Full range of motion of lower extremities. Neurologic: Alert.  Oriented to place person and year.  No focal neurological deficits.     CBC: Recent Labs  Lab 10/13/19 0120 10/13/19 0120 10/14/19 0511 10/14/19 0511 10/15/19 0610 10/16/19 0920 10/17/19 0107 10/18/19 0048 10/19/19 0330  WBC 10.7*   < > 9.4   < > 8.8   < > 10.0 10.6* 8.8  NEUTROABS 10.0*  --  8.7*  --  8.0*  --   --   --   --   HGB 13.2   < > 14.2   < > 14.0   < > 13.4 12.9 13.2  HCT 41.3   < > 45.2   < > 45.2   < > 43.8 41.6 43.1  MCV 102.0*   < > 104.9*   < > 104.4*   < > 105.5* 103.7* 104.4*  PLT 271   < > 333   < > 370   < > 372 410* 444*   < > =  values in this interval not displayed.   Basic Metabolic Panel: Recent Labs  Lab 10/13/19 0120 10/13/19 0120 10/14/19 0511 10/14/19 0511 10/15/19 0610 10/16/19 0920 10/17/19 0107 10/18/19 0048 10/19/19 0330  NA 138   < > 143   < > 143   < > 148* 138 137  K 5.5*   < > 5.7*   < > 5.2*   < > 4.5 4.6 4.9  CL 102   < > 105   < > 100   < > 103 94* 94*  CO2 28   < > 26   < > 33*   < > 35* 35* 32  GLUCOSE 292*   < > 302*   < > 290*   < > 170* 294* 360*  BUN 29*   < > 40*   < > 42*   < > 39* 37* 32*  CREATININE 1.01*   < > 1.22*   < > 1.14*   < > 1.06* 1.01* 0.98  CALCIUM 9.1   < > 9.5   < > 9.5   < > 9.0 8.9 8.8*  MG 2.8*   < > 3.1*  --  2.6*  --   --   --  2.6*  PHOS 2.1*  --  3.7  --  4.6  --   --   --   --    < > = values in this interval not displayed.   GFR: Estimated Creatinine Clearance: 91.9 mL/min (by C-G formula based on SCr of 0.98 mg/dL).  Liver Function Tests: Recent Labs  Lab 10/16/19 0920 10/17/19 0107 10/18/19 0048 10/19/19 0330  AST 24 19 16 18   ALT 19 19 18 19   ALKPHOS 72 63 69 76  BILITOT 0.6 0.6 0.6 0.5  PROT 7.9 7.1 7.3 7.9  ALBUMIN 3.3* 3.0* 2.8* 3.0*    HbA1C: Hgb A1c MFr Bld  Date/Time Value Ref Range Status  10/10/2019 05:10 PM 8.3 (H) 4.8 - 5.6 % Final    Comment:    (NOTE) Pre diabetes:          5.7%-6.4% Diabetes:              >6.4% Glycemic control for   <7.0% adults with diabetes   03/19/2018 04:00 AM 7.0 (H) 4.8 - 5.6 % Final    Comment:    (NOTE) Pre diabetes:          5.7%-6.4% Diabetes:              >6.4% Glycemic control for   <7.0% adults with diabetes     CBG: Recent Labs  Lab 10/18/19 1301 10/18/19 1610 10/18/19 2120 10/19/19 0844 10/19/19 1119  GLUCAP 378* 376* 192* 302* 383*    Recent Results (from the past 240 hour(s))  SARS CORONAVIRUS 2 (TAT 6-24 HRS) Nasopharyngeal Nasopharyngeal Swab     Status: Abnormal   Collection Time: 10/10/19 12:38 PM   Specimen: Nasopharyngeal Swab  Result Value Ref Range  Status   SARS Coronavirus 2 POSITIVE (A) NEGATIVE Final    Comment: RESULT CALLED TO, READ BACK BY AND VERIFIED WITH: A.OLEARY RN 2147 10/10/19 MCCORMICK K  (NOTE) SARS-CoV-2 target nucleic acids are DETECTED. The SARS-CoV-2 RNA is generally detectable in upper and lower respiratory specimens during the acute phase of infection. Positive results are indicative of the presence of SARS-CoV-2 RNA. Clinical correlation with patient history and other diagnostic information is  necessary to determine patient infection status. Positive results do not rule out bacterial infection or co-infection with other viruses.  The expected result is Negative. Fact Sheet for Patients: SugarRoll.be Fact Sheet for Healthcare Providers: https://www.woods-mathews.com/ This test is not yet approved or cleared by the [place] FDA and  has been authorized for detection and/or diagnosis of SARS-CoV-2 by FDA under an Emergency Use Authorization (EUA). This EUA will remain  in effect (meaning this test can be used) for  the duration of the COVID-19 declaration under Section 564(b)(1) of the Act, 21 U.S.C. section 360bbb-3(b)(1), unless the authorization is terminated or  revoked sooner. Performed at [facility], [place] [address] [place], [place] [address]   Blood Culture (routine x 2)     Status: None   Collection Time: 10/10/19  2:48 PM   Specimen: BLOOD  Result Value Ref Range Status   Specimen Description BLOOD RIGHT ANTECUBITAL  Final   Special Requests   Final    BOTTLES DRAWN AEROBIC AND ANAEROBIC Blood Culture adequate volume   Culture   Final    NO GROWTH 5 DAYS Performed at [facility], [place] [address], [place], [place] [address]    Report Status 10/15/2019 FINAL  Final  Blood Culture (routine x 2)     Status: None   Collection Time: 10/10/19  5:10 PM   Specimen: BLOOD RIGHT HAND  Result Value Ref Range Status   Specimen Description BLOOD  RIGHT HAND  Final   Special Requests   Final    BOTTLES DRAWN AEROBIC AND ANAEROBIC Blood Culture adequate volume   Culture   Final    NO GROWTH 5 DAYS Performed at [facility], [place] [address], [place], [place] [address]    Report Status 10/15/2019 FINAL  Final     Radiology  No results found.   Scheduled Meds: . allopurinol  100 mg Oral Daily  . aspirin EC  162 mg Oral Daily  . atorvastatin  20 mg Oral q1800  . carvedilol  6.25 mg Oral BID WC  . Chlorhexidine Gluconate Cloth  6 each Topical Daily  . dexamethasone (DECADRON) injection  2 mg Intravenous Q24H  . digoxin  0.0625 mg Oral Daily  . enoxaparin (LOVENOX) injection  90 mg Subcutaneous Q24H  . fluticasone furoate-vilanterol  1 puff Inhalation Daily  . guaiFENesin  1,200 mg Oral BID  . insulin aspart  0-15 Units Subcutaneous TID WC  . insulin aspart  0-5 Units Subcutaneous QHS  . insulin aspart  8 Units Subcutaneous TID WC  . insulin detemir  30 Units Subcutaneous BID  . levothyroxine  25 mcg Oral Q0600  . linagliptin  5 mg Oral Daily  . pantoprazole  40 mg Oral Daily  . pregabalin  100 mg Oral QPM  . torsemide  20 mg Oral Daily   Continuous Infusions:    LOS: 9 days   [facility] Office  [phone] Pager - Text Page per Shea Evans  If 7PM-7AM, please contact night-coverage per [place] 10/19/2019, 12:01 PM

## 2019-10-20 LAB — COMPREHENSIVE METABOLIC PANEL
ALT: 22 U/L (ref 0–44)
AST: 21 U/L (ref 15–41)
Albumin: 3 g/dL — ABNORMAL LOW (ref 3.5–5.0)
Alkaline Phosphatase: 82 U/L (ref 38–126)
Anion gap: 9 (ref 5–15)
BUN: 38 mg/dL — ABNORMAL HIGH (ref 8–23)
CO2: 29 mmol/L (ref 22–32)
Calcium: 8.4 mg/dL — ABNORMAL LOW (ref 8.9–10.3)
Chloride: 95 mmol/L — ABNORMAL LOW (ref 98–111)
Creatinine, Ser: 1.03 mg/dL — ABNORMAL HIGH (ref 0.44–1.00)
GFR calc Af Amer: >60 mL/min (ref >60)
GFR calc non Af Amer: 54 mL/min — ABNORMAL LOW (ref >60)
Glucose, Bld: 327 mg/dL — ABNORMAL HIGH (ref 70–99)
Potassium: 5.4 mmol/L — ABNORMAL HIGH (ref 3.5–5.1)
Sodium: 133 mmol/L — ABNORMAL LOW (ref 135–145)
Total Bilirubin: 0.6 mg/dL (ref 0.3–1.2)
Total Protein: 7.5 g/dL (ref 6.5–8.1)

## 2019-10-20 LAB — CBC
HCT: 39.6 % (ref 36.0–46.0)
Hemoglobin: 12.5 g/dL (ref 12.0–15.0)
MCH: 32.8 pg (ref 26.0–34.0)
MCHC: 31.6 g/dL (ref 30.0–36.0)
MCV: 103.9 fL — ABNORMAL HIGH (ref 80.0–100.0)
Platelets: 427 10*3/uL — ABNORMAL HIGH (ref 150–400)
RBC: 3.81 MIL/uL — ABNORMAL LOW (ref 3.87–5.11)
RDW: 14.4 % (ref 11.5–15.5)
WBC: 9.9 10*3/uL (ref 4.0–10.5)
nRBC: 0 % (ref 0.0–0.2)

## 2019-10-20 LAB — GLUCOSE, CAPILLARY
Glucose-Capillary: 249 mg/dL — ABNORMAL HIGH (ref 70–99)
Glucose-Capillary: 252 mg/dL — ABNORMAL HIGH (ref 70–99)
Glucose-Capillary: 394 mg/dL — ABNORMAL HIGH (ref 70–99)

## 2019-10-20 LAB — POTASSIUM: Potassium: 4.4 mmol/L (ref 3.5–5.1)

## 2019-10-20 MED ORDER — TORSEMIDE 20 MG PO TABS: 20.0000 mg | ORAL_TABLET | Freq: Every day | ORAL | 0 refills | Status: AC

## 2019-10-20 MED ORDER — SODIUM ZIRCONIUM CYCLOSILICATE 10 G PO PACK
10.0000 g | PACK | Freq: Once | ORAL | Status: AC
  Administered 2019-10-20: 08:00:00 10 g via ORAL
  Filled 2019-10-20: qty 1

## 2019-10-20 NOTE — Progress Notes (Signed)
SATURATION QUALIFICATIONS: (This note is used to comply with regulatory documentation for home oxygen)  Patient Saturations on Room Air at Rest = 88%  Patient Saturations on Room Air while Ambulating = 84%  Patient Saturations on 3 Liters of oxygen while Ambulating = 91%  Please briefly explain why patient needs home oxygen: Needs supplemental O2 at rest and with activity to maintain adequate oxygenation.  [place] Pager [phone] Office [phone]

## 2019-10-20 NOTE — Progress Notes (Signed)
Physical Therapy Treatment Patient Details Name: Lisa [name] MRN: [id number] DOB: [birth year] Today's Date: 10/20/2019    History of Present Illness 74 y/o female w/ PMHx; PNA, PAD, non ischemic cardiomyopathy, neuropathy, mild CAD, HTN, HLD, GERD, former tobacco use, dyspnea, DM, COPD, chronic systolic CHF, Bell's Palsy, arthritis, AICD, B joint replacements, COVID + 1/11    PT Comments    Pt continues to make good progress with mobility. Continues to require supplemental O2.  SaO2 on room air at rest = 88% SaO2 on room air while ambulating = 84% SaO2 on 3 liters of O2 while ambulating = 91%   Follow Up Recommendations  Home health PT;Supervision/Assistance - 24 hour     Equipment Recommendations  None recommended by PT    Recommendations for Other Services       Precautions / Restrictions Precautions Precautions: Fall Restrictions Weight Bearing Restrictions: No    Mobility  Bed Mobility               General bed mobility comments: Pt up in chair  Transfers Overall transfer level: Needs assistance Equipment used: Rolling walker (2 wheeled) Transfers: Sit to/from Stand Sit to Stand: Supervision         General transfer comment: Assist for safety/lines  Ambulation/Gait Ambulation/Gait assistance: Supervision Gait Distance (Feet): 150 Feet Assistive device: Rolling walker (2 wheeled) Gait Pattern/deviations: Step-through pattern;Decreased stride length Gait velocity: decr Gait velocity interpretation: 1.31 - 2.62 ft/sec, indicative of limited community ambulator General Gait Details: assist for safety and lines   Stairs             Wheelchair Mobility    Modified Rankin (Stroke Patients Only)       Balance Overall balance assessment: Needs assistance Sitting-balance support: Feet supported;No upper extremity supported Sitting balance-Leahy Scale: Good     Standing balance support: Bilateral upper extremity supported Standing  balance-Leahy Scale: Poor Standing balance comment: walker and supervision for static standing                            Cognition Arousal/Alertness: Awake/alert Behavior During Therapy: WFL for tasks assessed/performed Overall Cognitive Status: Within Functional Limits for tasks assessed                                        Exercises      General Comments General comments (skin integrity, edema, etc.): Pt required 3L of O2 with activity to keep SpO2>88%      Pertinent Vitals/Pain Pain Assessment: Faces Faces Pain Scale: Hurts a little bit Pain Location: feet Pain Descriptors / Indicators: Sore Pain Intervention(s): Limited activity within patient's tolerance;Monitored during session;Repositioned    Home Living                      Prior Function            PT Goals (current goals can now be found in the care plan section) Acute Rehab PT Goals Patient Stated Goal: go home Progress towards PT goals: Progressing toward goals    Frequency    Min 3X/week      PT Plan Discharge plan needs to be updated    Co-evaluation              AM-PAC PT "6 Clicks" Mobility   Outcome Measure  Help needed turning from your  back to your side while in a flat bed without using bedrails?: None Help needed moving from lying on your back to sitting on the side of a flat bed without using bedrails?: None Help needed moving to and from a bed to a chair (including a wheelchair)?: A Little Help needed standing up from a chair using your arms (e.g., wheelchair or bedside chair)?: A Little Help needed to walk in hospital room?: A Little Help needed climbing 3-5 steps with a railing? : A Little 6 Click Score: 20    End of Session Equipment Utilized During Treatment: Oxygen;Gait belt Activity Tolerance: Patient tolerated treatment well Patient left: with call bell/phone within reach;in chair;with chair alarm set Nurse Communication: Mobility  status PT Visit Diagnosis: Other abnormalities of gait and mobility (R26.89)     Time: [id number] PT Time Calculation (min) (ACUTE ONLY): 17 min  Charges:  $Gait Training: 8-22 mins                     [place] Pager [phone] Office [place] 10/20/2019, 12:18 PM

## 2019-10-20 NOTE — Progress Notes (Signed)
Patient sleeping better tonight than last night, seems much more oriented tonight than last also,  currently AO x4.  Patient continent of bowel but continues be incontinent of urine.  Patient on room air until 2300 tonight, when laying down to sleep, now requires 2L to maintain sats >90%.

## 2019-10-20 NOTE — TOC Transition Note (Signed)
Transition of Care [facility]) - CM/SW Discharge Note   Patient Details  Name: Lisa [name] MRN: [id number] Date of Birth: 05-30-1946  [facility]) CM/SW Contact:  Shade Flood, LCSW Phone Number: 10/20/2019, 4:45 PM   Clinical Narrative:     Pt stable for dc per MD. [place] O2 with [employer] and they will be delivering a portable O2 concentrator to the hospital before pt leaves. Asked AC to bring portable Etank to pt's room as well. Once both those items are there for pt, she can be discharged. Arranging [place] PT/OT with [facility].   There are no other TOC needs for dc.  Final next level of care: [place] Barriers to Discharge: Barriers Resolved   Patient Goals and CMS Choice        Discharge Placement                       Discharge Plan and Services In-house Referral: Clinical Social Work   Post Acute Care Choice: Durable Medical Equipment          DME Arranged: Oxygen DME Agency: [employer] Date DME Agency Contacted: 10/20/19   Representative spoke with at DME Agency: Thedore Mins HH Arranged: PT, OT [place] Agency: [facility] (formerly [employer]) Date [place]: 10/20/19   Representative spoke with at [place]: [place] ([place]) Interventions     Readmission Risk Interventions Readmission Risk Prevention Plan 10/20/2019  Transportation Screening Complete  HRI or [place] Complete  Social Work Consult for [place] Planning/Counseling [place] Not Applicable  Medication Review Press photographer) Complete  Some recent data might be hidden

## 2019-10-20 NOTE — Discharge Instructions (Signed)
Orthostatic Hypotension °Blood pressure is a measurement of how strongly, or weakly, your blood is pressing against the walls of your arteries. Orthostatic hypotension is a sudden drop in blood pressure that happens when you quickly change positions, such as when you get up from sitting or lying down. °Arteries are blood vessels that carry blood from your heart throughout your body. When blood pressure is too low, you may not get enough blood to your brain or to the rest of your organs. This can cause weakness, light-headedness, rapid heartbeat, and fainting. This can last for just a few seconds or for up to a few minutes. Orthostatic hypotension is usually not a serious problem. However, if it happens frequently or gets worse, it may be a sign of something more serious. °What are the causes? °This condition may be caused by: °· Sudden changes in posture, such as standing up quickly after you have been sitting or lying down. °· Blood loss. °· Loss of body fluids (dehydration). °· Heart problems. °· Hormone (endocrine) problems. °· Pregnancy. °· Severe infection. °· Lack of certain nutrients. °· Severe allergic reactions (anaphylaxis). °· Certain medicines, such as blood pressure medicine or medicines that make the body lose excess fluids (diuretics). Sometimes, this condition can be caused by not taking medicine as directed, such as taking too much of a certain medicine. °What increases the risk? °The following factors may make you more likely to develop this condition: °· Age. Risk increases as you get older. °· Conditions that affect the heart or the central nervous system. °· Taking certain medicines, such as blood pressure medicine or diuretics. °· Being pregnant. °What are the signs or symptoms? °Symptoms of this condition may include: °· Weakness. °· Light-headedness. °· Dizziness. °· Blurred vision. °· Fatigue. °· Rapid heartbeat. °· Fainting, in severe cases. °How is this diagnosed? °This condition is  diagnosed based on: °· Your medical history. °· Your symptoms. °· Your blood pressure measurement. Your health care provider will check your blood pressure when you are: °? Lying down. °? Sitting. °? Standing. °A blood pressure reading is recorded as two numbers, such as "120 over 80" (or 120/80). The first ("top") number is called the systolic pressure. It is a measure of the pressure in your arteries as your heart beats. The second ("bottom") number is called the diastolic pressure. It is a measure of the pressure in your arteries when your heart relaxes between beats. Blood pressure is measured in a unit called mm Hg. Healthy blood pressure for most adults is 120/80. If your blood pressure is below 90/60, you may be diagnosed with hypotension. °Other information or tests that may be used to diagnose orthostatic hypotension include: °· Your other vital signs, such as your heart rate and temperature. °· Blood tests. °· Tilt table test. For this test, you will be safely secured to a table that moves you from a lying position to an upright position. Your heart rhythm and blood pressure will be monitored during the test. °How is this treated? °This condition may be treated by: °· Changing your diet. This may involve eating more salt (sodium) or drinking more water. °· Taking medicines to raise your blood pressure. °· Changing the dosage of certain medicines you are taking that might be lowering your blood pressure. °· Wearing compression stockings. These stockings help to prevent blood clots and reduce swelling in your legs. °In some cases, you may need to go to the hospital for: °· Fluid replacement. This means you will   receive fluids through an IV.  Blood replacement. This means you will receive donated blood through an IV (transfusion).  Treating an infection or heart problems, if this applies.  Monitoring. You may need to be monitored while medicines that you are taking wear off. Follow these instructions  at home: Eating and drinking   Drink enough fluid to keep your urine pale yellow.  Eat a healthy diet, and follow instructions from your health care provider about eating or drinking restrictions. A healthy diet includes: ? Fresh fruits and vegetables. ? Whole grains. ? Lean meats. ? Low-fat dairy products.  Eat extra salt only as directed. Do not add extra salt to your diet unless your health care provider told you to do that.  Eat frequent, small meals.  Avoid standing up suddenly after eating. Medicines  Take over-the-counter and prescription medicines only as told by your health care provider. ? Follow instructions from your health care provider about changing the dosage of your current medicines, if this applies. ? Do not stop or adjust any of your medicines on your own. General instructions   Wear compression stockings as told by your health care provider.  Get up slowly from lying down or sitting positions. This gives your blood pressure a chance to adjust.  Avoid hot showers and excessive heat as directed by your health care provider.  Return to your normal activities as told by your health care provider. Ask your health care provider what activities are safe for you.  Do not use any products that contain nicotine or tobacco, such as cigarettes, e-cigarettes, and chewing tobacco. If you need help quitting, ask your health care provider.  Keep all follow-up visits as told by your health care provider. This is important. Contact a health care provider if you:  Vomit.  Have diarrhea.  Have a fever for more than 2-3 days.  Feel more thirsty than usual.  Feel weak and tired. Get help right away if you:  Have chest pain.  Have a fast or irregular heartbeat.  Develop numbness in any part of your body.  Cannot move your arms or your legs.  Have trouble speaking.  Become sweaty or feel light-headed.  Faint.  Feel short of breath.  Have trouble staying  awake.  Feel confused. Summary  Orthostatic hypotension is a sudden drop in blood pressure that happens when you quickly change positions.  Orthostatic hypotension is usually not a serious problem.  It is diagnosed by having your blood pressure taken lying down, sitting, and then standing.  It may be treated by changing your diet or adjusting your medicines. This information is not intended to replace advice given to you by your health care provider. Make sure you discuss any questions you have with your health care provider. Document Revised: 03/07/2018 Document Reviewed: 03/07/2018 Elsevier Patient Education  2020 [place].   COVID-19 COVID-19 is a respiratory infection that is caused by a virus called severe acute respiratory syndrome coronavirus 2 (SARS-CoV-2). The disease is also known as coronavirus disease or novel coronavirus. In some people, the virus may not cause any symptoms. In others, it may cause a serious infection. The infection can get worse quickly and can lead to complications, such as:  Pneumonia, or infection of the lungs.  Acute respiratory distress syndrome or ARDS. This is a condition in which fluid build-up in the lungs prevents the lungs from filling with air and passing oxygen into the blood.  Acute respiratory failure. This is a condition  in which there is not enough oxygen passing from the lungs to the body or when carbon dioxide is not passing from the lungs out of the body.  Sepsis or septic shock. This is a serious bodily reaction to an infection.  Blood clotting problems.  Secondary infections due to bacteria or fungus.  Organ failure. This is when your body's organs stop working. The virus that causes COVID-19 is contagious. This means that it can spread from person to person through droplets from coughs and sneezes (respiratory secretions). What are the causes? This illness is caused by a virus. You may catch the virus by:  Breathing in  droplets from an infected person. Droplets can be spread by a person breathing, speaking, singing, coughing, or sneezing.  Touching something, like a table or a doorknob, that was exposed to the virus (contaminated) and then touching your mouth, nose, or eyes. What increases the risk? Risk for infection You are more likely to be infected with this virus if you:  Are within 6 feet (2 meters) of a person with COVID-19.  Provide care for or live with a person who is infected with COVID-19.  Spend time in crowded indoor spaces or live in shared housing. Risk for serious illness You are more likely to become seriously ill from the virus if you:  Are 76 years of age or older. The higher your age, the more you are at risk for serious illness.  Live in a nursing home or long-term care facility.  Have cancer.  Have a long-term (chronic) disease such as: ? Chronic lung disease, including chronic obstructive pulmonary disease or asthma. ? A long-term disease that lowers your body's ability to fight infection (immunocompromised). ? Heart disease, including heart failure, a condition in which the arteries that lead to the heart become narrow or blocked (coronary artery disease), a disease which makes the heart muscle thick, weak, or stiff (cardiomyopathy). ? Diabetes. ? Chronic kidney disease. ? Sickle cell disease, a condition in which red blood cells have an abnormal "sickle" shape. ? Liver disease.  Are obese. What are the signs or symptoms? Symptoms of this condition can range from mild to severe. Symptoms may appear any time from 2 to 14 days after being exposed to the virus. They include:  A fever or chills.  A cough.  Difficulty breathing.  Headaches, body aches, or muscle aches.  Runny or stuffy (congested) nose.  A sore throat.  New loss of taste or smell. Some people may also have stomach problems, such as nausea, vomiting, or diarrhea. Other people may not have any  symptoms of COVID-19. How is this diagnosed? This condition may be diagnosed based on:  Your signs and symptoms, especially if: ? You live in an area with a COVID-19 outbreak. ? You recently traveled to or from an area where the virus is common. ? You provide care for or live with a person who was diagnosed with COVID-19. ? You were exposed to a person who was diagnosed with COVID-19.  A physical exam.  Lab tests, which may include: ? Taking a sample of fluid from the back of your nose and throat (nasopharyngeal fluid), your nose, or your throat using a swab. ? A sample of mucus from your lungs (sputum). ? Blood tests.  Imaging tests, which may include, X-rays, CT scan, or ultrasound. How is this treated? At present, there is no medicine to treat COVID-19. Medicines that treat other diseases are being used on a trial basis  to see if they are effective against COVID-19. Your health care provider will talk with you about ways to treat your symptoms. For most people, the infection is mild and can be managed at home with rest, fluids, and over-the-counter medicines. Treatment for a serious infection usually takes places in a hospital intensive care unit (ICU). It may include one or more of the following treatments. These treatments are given until your symptoms improve.  Receiving fluids and medicines through an IV.  Supplemental oxygen. Extra oxygen is given through a tube in the nose, a face mask, or a hood.  Positioning you to lie on your stomach (prone position). This makes it easier for oxygen to get into the lungs.  Continuous positive airway pressure (CPAP) or bi-level positive airway pressure (BPAP) machine. This treatment uses mild air pressure to keep the airways open. A tube that is connected to a motor delivers oxygen to the body.  Ventilator. This treatment moves air into and out of the lungs by using a tube that is placed in your windpipe.  Tracheostomy. This is a procedure  to create a hole in the neck so that a breathing tube can be inserted.  Extracorporeal membrane oxygenation (ECMO). This procedure gives the lungs a chance to recover by taking over the functions of the heart and lungs. It supplies oxygen to the body and removes carbon dioxide. Follow these instructions at home: Lifestyle  If you are sick, stay home except to get medical care. Your health care provider will tell you how long to stay home. Call your health care provider before you go for medical care.  Rest at home as told by your health care provider.  Do not use any products that contain nicotine or tobacco, such as cigarettes, e-cigarettes, and chewing tobacco. If you need help quitting, ask your health care provider.  Return to your normal activities as told by your health care provider. Ask your health care provider what activities are safe for you. General instructions  Take over-the-counter and prescription medicines only as told by your health care provider.  Drink enough fluid to keep your urine pale yellow.  Keep all follow-up visits as told by your health care provider. This is important. How is this prevented?  There is no vaccine to help prevent COVID-19 infection. However, there are steps you can take to protect yourself and others from this virus. To protect yourself:   Do not travel to areas where COVID-19 is a risk. The areas where COVID-19 is reported change often. To identify high-risk areas and travel restrictions, check the CDC travel website: FatFares.com.br  If you live in, or must travel to, an area where COVID-19 is a risk, take precautions to avoid infection. ? Stay away from people who are sick. ? Wash your hands often with soap and water for 20 seconds. If soap and water are not available, use an alcohol-based hand sanitizer. ? Avoid touching your mouth, face, eyes, or nose. ? Avoid going out in public, follow guidance from your state and local  health authorities. ? If you must go out in public, wear a cloth face covering or face mask. Make sure your mask covers your nose and mouth. ? Avoid crowded indoor spaces. Stay at least 6 feet (2 meters) away from others. ? Disinfect objects and surfaces that are frequently touched every day. This may include:  Counters and tables.  Doorknobs and light switches.  Sinks and faucets.  Electronics, such as phones, remote controls, keyboards,  computers, and tablets. To protect others: If you have symptoms of COVID-19, take steps to prevent the virus from spreading to others.  If you think you have a COVID-19 infection, contact your health care provider right away. Tell your health care team that you think you may have a COVID-19 infection.  Stay home. Leave your house only to seek medical care. Do not use public transport.  Do not travel while you are sick.  Wash your hands often with soap and water for 20 seconds. If soap and water are not available, use alcohol-based hand sanitizer.  Stay away from other members of your household. Let healthy household members care for children and pets, if possible. If you have to care for children or pets, wash your hands often and wear a mask. If possible, stay in your own room, separate from others. Use a different bathroom.  Make sure that all people in your household wash their hands well and often.  Cough or sneeze into a tissue or your sleeve or elbow. Do not cough or sneeze into your hand or into the air.  Wear a cloth face covering or face mask. Make sure your mask covers your nose and mouth. Where to find more information  Centers for Disease Control and Prevention: PurpleGadgets.be  World Health Organization: https://www.castaneda.info/ Contact a health care provider if:  You live in or have traveled to an area where COVID-19 is a risk and you have symptoms of the infection.  You have had contact  with someone who has COVID-19 and you have symptoms of the infection. Get help right away if:  You have trouble breathing.  You have pain or pressure in your chest.  You have confusion.  You have bluish lips and fingernails.  You have difficulty waking from sleep.  You have symptoms that get worse. These symptoms may represent a serious problem that is an emergency. Do not wait to see if the symptoms will go away. Get medical help right away. Call your local emergency services (911 in the [place] Do not drive yourself to the hospital. Let the emergency medical personnel know if you think you have COVID-19. Summary  COVID-19 is a respiratory infection that is caused by a virus. It is also known as coronavirus disease or novel coronavirus. It can cause serious infections, such as pneumonia, acute respiratory distress syndrome, acute respiratory failure, or sepsis.  The virus that causes COVID-19 is contagious. This means that it can spread from person to person through droplets from breathing, speaking, singing, coughing, or sneezing.  You are more likely to develop a serious illness if you are 20 years of age or older, have a weak immune system, live in a nursing home, or have chronic disease.  There is no medicine to treat COVID-19. Your health care provider will talk with you about ways to treat your symptoms.  Take steps to protect yourself and others from infection. Wash your hands often and disinfect objects and surfaces that are frequently touched every day. Stay away from people who are sick and wear a mask if you are sick. This information is not intended to replace advice given to you by your health care provider. Make sure you discuss any questions you have with your health care provider. Document Revised: 07/11/2019 Document Reviewed: 10/17/2018 Elsevier Patient Education  [employer].

## 2019-10-20 NOTE — Care Management Important Message (Signed)
Important Message  Patient Details  Name: Lisa [name] MRN: [id number] Date of Birth: 1946/04/10   Medicare Important Message Given:  Yes - Important Message mailed due to current National Emergency  Verbal consent obtained due to current National Emergency  Relationship to patient: Child Contact Name: Jola Babinski Call Date: 10/20/19  Time: 1529 Phone: [phone] Outcome: Spoke with contact Important Message mailed to: Patient address on file    Delorse Lek 10/20/2019, 3:29 PM

## 2019-10-20 NOTE — Progress Notes (Signed)
Inpatient Diabetes Program Recommendations  AACE/ADA: New Consensus Statement on Inpatient Glycemic Control   Target Ranges:  Prepandial:   less than 140 mg/dL      Peak postprandial:   less than 180 mg/dL (1-2 hours)      Critically ill patients:  140 - 180 mg/dL   Results for Lisa [name], Lisa [name] (MRN [id number]) as of 10/20/2019 10:20  Ref. Range 10/19/2019 08:44 10/19/2019 11:19 10/19/2019 15:30 10/19/2019 19:44 10/20/2019 07:12  Glucose-Capillary Latest Ref Range: 70 - 99 mg/dL 302 (H) 383 (H) 428 (H) 309 (H) 249 (H)   Review of Glycemic Control  Current orders for Inpatient glycemic control: Levemir 35 units BID, Novolog 8 units TID with meals, Novolog 0-15 units TID with meals, Novolog 0-5 units QHS, Tradjenta 5 mg daily; Decadron 2 mg Q24H  Inpatient Diabetes Program Recommendations:    Insulin- If steroids are continued,please consider increasing Levemir to 45 units BID and meal coverage to Novolog 12 units TID with meals.  Thanks, Barnie Alderman, RN, MSN, CDE Diabetes Coordinator Inpatient Diabetes Program [phone] (Team Pager from 8am to 5pm)

## 2019-10-20 NOTE — Discharge Summary (Signed)
[facility]  Physician Discharge Summary   Patient ID: Lisa [name] MRN: [id number] DOB/AGE: 1946/04/01 74 y.o.  Admit date: 10/10/2019 Discharge date: 10/20/2019  PCP: Kelton Pillar, MD  DISCHARGE DIAGNOSES:  Pneumonia due to COVID-19 Acute respiratory failure with hypoxia Acute metabolic encephalopathy Orthostatic hypotension Urinary tract infection Chronic systolic CHF Acute kidney injury on chronic kidney disease stage II Diabetes mellitus type 2 uncontrolled with hyperglycemia Macrocytosis Hypothyroidism   RECOMMENDATIONS FOR OUTPATIENT FOLLOW UP: 1. Follow-up with PCP and cardiology   Home Health: PT and OT Equipment/Devices: Home oxygen  CODE STATUS: Full code  DISCHARGE CONDITION: fair  Diet recommendation: Modified carbohydrate  INITIAL HISTORY: 74yo w/ a hx of Systolic CHF ([id number]), AICD, COPD, HTN, and DM2 who presented w/ SOB and was found to have a sat in the 60s on RA.    HOSPITAL COURSE:   Pneumonia due to COVID-19/acute respiratory failure with hypoxia Patient appears to be improved from a respiratory standpoint.  She is requiring 1 to 3 L of oxygen saturating in the 90s.  Inflammatory markers have improved.  She completed course of remdesivir.  Steroids being tapered down.  Seen by PT and OT.  Patient declines rehab.  Family will be able to provide adequate support at home.  Home health will be ordered.    Home oxygen will also be ordered.  Acute metabolic encephalopathy Reason for this was not entirely clear but likely multifactorial including COVID-19, medication effect.  She was apparently given Zyprexa.  B12 and folate acid levels were not low.  TSH was normal.  Ammonia only mildly elevated.  This case was also discussed with neurology who felt that this was likely medication effect.  EEG did not show any seizure activity.  All sedatives were stopped.  Dose of steroid was decreased.  Digoxin level was less than 0.2 on 1/19.[name]  Per  family patient does not have any cognitive issues at baseline.  UA noted to be abnormal.  Patient was started on ceftriaxone.   Mental status has improved significantly.    Orthostatic hypotension Drop in blood pressure primarily with standing up or sitting up.  Not symptomatic.  Able to ambulate.  For now we will discontinue her blood pressure lowering agents.  Dose of torsemide will also be decreased and will start after 1 week .    Urinary tract infection UA was noted to be abnormal. According to daughter patient has frequent UTIs.  Last cultures available from November 2019 which showed E. coli sensitive to ceftriaxone.  Urine culture was ordered however not sent.   Completed 5-day course of ceftriaxone.  Chronic systolic CHF EF is 123456 based on echocardiogram from December.  Followed by cardiology.  Seems to be euvolemic for the most part.    She was given IV Lasix.  Since she is now orthostatic she will be asked to resume her torsemide after 1 week.  Acute kidney injury on chronic kidney disease stage II/hyperkalemia/hypernatremia Creatinine seems to be back to her baseline.    Diabetes mellitus type 2, uncontrolled with hyperglycemia A1c 8.3.    Elevated CBGs due to steroids.  Should improve as steroid is tapered down.  She may resume her home medication regimen.      Macrocytosis 123456 folic acid and TSH levels were not abnormal.  Prolonged QTC Continue to monitor electrolytes  Hypothyroidism  Continue levothyroxine.    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  PVD s/p fem/fem bypass Stable  Obesity Estimated body mass index is 56.21 kg/m as calculated from the following:   Height as of this encounter: 5\' 10"  (1.778 m).   Weight as of this encounter: 177.7 kg.   Overall  stable.  Okay for discharge home today.   PERTINENT LABS:  The results of significant diagnostics from this hospitalization (including imaging, microbiology, ancillary and laboratory) are listed below for reference.     Labs:    Basic Metabolic Panel: Recent Labs  Lab 10/14/19 0511 10/14/19 0511 10/15/19 0610 10/15/19 0610 10/16/19 0920 10/16/19 0920 10/17/19 0107 10/18/19 0048 10/19/19 0330 10/20/19 0259 10/20/19 1240  NA 143   < > 143   < > 148*  --  148* 138 137 133*  --   K 5.7*   < > 5.2*   < > 4.7   < > 4.5 4.6 4.9 5.4* 4.4  CL 105   < > 100   < > 104  --  103 94* 94* 95*  --   CO2 26   < > 33*   < > 36*  --  35* 35* 32 29  --   GLUCOSE 302*   < > 290*   < > 277*  --  170* 294* 360* 327*  --   BUN 40*   < > 42*   < > 38*  --  39* 37* 32* 38*  --   CREATININE 1.22*   < > 1.14*   < > 1.08*  --  1.06* 1.01* 0.98 1.03*  --   CALCIUM 9.5   < > 9.5   < > 9.5  --  9.0 8.9 8.8* 8.4*  --   MG 3.1*  --  2.6*  --   --   --   --   --  2.6*  --   --   PHOS 3.7  --  4.6  --   --   --   --   --   --   --   --    < > = values in this interval not displayed.   Liver Function Tests: Recent Labs  Lab 10/16/19 0920 10/17/19 0107 10/18/19 0048 10/19/19 0330 10/20/19 0259  AST 24 19 16 18 21   ALT 19 19 18 19 22   ALKPHOS 72 63 69 76 82  BILITOT 0.6 0.6 0.6 0.5 0.6  PROT 7.9 7.1 7.3 7.9 7.5  ALBUMIN 3.3* 3.0* 2.8* 3.0* 3.0*    Recent Labs  Lab 10/15/19 0610  AMMONIA 43*   CBC: Recent Labs  Lab 10/14/19 0511 10/14/19 0511 10/15/19 0610 10/15/19 0610 10/16/19 0920 10/17/19 0107 10/18/19 0048 10/19/19 0330 10/20/19 0259  WBC 9.4   < > 8.8   < > 11.0* 10.0 10.6* 8.8 9.9  NEUTROABS 8.7*  --  8.0*  --   --   --   --   --   --   HGB 14.2   < > 14.0   < > 14.1 13.4 12.9 13.2 12.5  HCT 45.2   < > 45.2   < > 45.5 43.8 41.6 43.1 39.6  MCV 104.9*   < > 104.4*   < > 104.4* 105.5* 103.7* 104.4* 103.9*  PLT 333   < > 370   < > 392 372 410* 444* 427*   < > = values in this  interval not displayed.  BNP: BNP (last 3 results) Recent Labs    10/10/19 1236  BNP 317.2*   CBG: Recent Labs  Lab 10/19/19 1530 10/19/19 1944 10/20/19 0712 10/20/19 1057 10/20/19 1620  GLUCAP 428* 309* 249* 252* 394*     IMAGING STUDIES EEG  Result Date: 10/12/2019 Lora Havens, MD     10/12/2019  9:09 AM Patient Name: Lisa [name] MRN: [id number] Epilepsy Attending: Lora Havens Referring Physician/Provider: Dr Einar Gip Date: 10/12/2019 Duration: 23.34 mins Patient history: 74yo COVID female with worsening ams. EEG to evaluate for seizure. Level of alertness: awake AEDs during EEG study: None Technical aspects: This EEG study was done with scalp electrodes positioned according to the 10-20 International system of electrode placement. Electrical activity was acquired at a sampling rate of 500Hz  and reviewed with a high frequency filter of 70Hz  and a low frequency filter of 1Hz . EEG data were recorded continuously and digitally stored. DESCRIPTION: No clear posterior dominant rhythm was seen. EEG showed continuous generalized 3-5Hz  theta-delta slowing. Hyperventilation and photic stimulation were not performed. Of note, eeg was difficult to interpret dur to significant movement artifact. ABNORMALITY - Continuous slow, generalized IMPRESSION: This technically difficult study is suggestive of moderate diffuse encephalopathy, non specific to etiology.No seizures or epileptiform discharges were seen throughout the recording. Lora Havens   DG Chest Port 1 View  Result Date: 10/14/2019 CLINICAL DATA:  COVID-19 pneumonia. EXAM: PORTABLE CHEST 1 VIEW COMPARISON:  One-view chest x-ray 10/10/2019 FINDINGS: Heart is enlarged. Pacing and defibrillator wires are stable. Atherosclerotic changes are noted at the aortic arch. Bilateral interstitial and airspace opacities are again seen. There is progressive disease in the right upper lobe and lower lobe. Left pleural effusion is  suspected. IMPRESSION: Progressive interstitial and airspace disease in the right upper lobe and lower lobe compatible with multi lobar pneumonia. Electronically Signed   By: San Morelle M.D.   On: 10/14/2019 06:42   DG Chest Port 1 View  Result Date: 10/10/2019 CLINICAL DATA:  Pt from home, was having a virtual visit with PCP, Doc observed her being very short of breath and called EMS. Pt tested positive 1/11. EXAM: PORTABLE CHEST 1 VIEW COMPARISON:  07/26/2018. FINDINGS: Cardiac silhouette is mildly enlarged. No mediastinal or hilar masses. Lungs demonstrate prominent bronchovascular markings. There is also intervening hazy opacity most evident in right lower lung, more subtly suggested peripherally in the right mid lung and left lung base. No convincing pleural effusion.  No pneumothorax. Left anterior chest wall AICD is stable. Skeletal structures are demineralized but grossly intact. IMPRESSION: 1. Subtle hazy lung opacities are superimposed upon chronically prominent bronchovascular markings, most evident in the right lower lung. Findings are suspicious for multifocal infection. Electronically Signed   By: Lajean Manes M.D.   On: 10/10/2019 13:39    DISCHARGE EXAMINATION: Vitals:   10/20/19 0735 10/20/19 0800 10/20/19 1200 10/20/19 1600  BP:  91/70  (!) 123/54  Pulse: 71 62 85 71  Resp:  (!) 29 (!) 29 (!) 21  Temp:  97.9 F (36.6 C)    TempSrc:  Oral  Oral  SpO2:  94% 91% 94%  Weight:      Height:       General appearance: Awake alert.  In no distress Resp: Clear to auscultation bilaterally.  Normal effort Cardio: S1-S2 is normal regular.  No S3-S4.  No rubs murmurs or bruit GI: Abdomen is soft.  Nontender nondistended.  Bowel sounds are present normal.  No masses organomegaly  DISPOSITION: Home with home health  Discharge Instructions    Call MD for:  difficulty breathing, headache or visual disturbances   Complete by: As directed    Call MD for:  extreme fatigue    Complete by: As directed    Call MD for:  persistant dizziness or light-headedness   Complete by: As directed    Call MD for:  persistant nausea and vomiting   Complete by: As directed    Call MD for:  severe uncontrolled pain   Complete by: As directed    Call MD for:  temperature >100.4   Complete by: As directed    Diet Carb Modified   Complete by: As directed    Discharge instructions   Complete by: As directed    Please get up slowly from a sitting or lying position to avoid a sudden drop in blood pressure.  We are holding all of your blood pressure lowering medications due to drop in blood pressure when you sit or stand.  Please talk to your doctor at follow-up about resuming these.  Please note that we are also holding your "fluid pill", torsemide.  You may resume it at a lower dose from next week as instructed.  Please check your glucose levels at home and call your doctor if the levels are greater than 200 consistently.  COVID 19 INSTRUCTIONS  - You are felt to be stable enough to no longer require inpatient monitoring, testing, and treatment, though you will need to follow the recommendations below: - Based on the CDC's non-test criteria for ending self-isolation: You may not return to work/leave the home until at least 21 days since symptom onset AND 3 days without a fever (without taking tylenol, ibuprofen, etc.) AND have improvement in respiratory symptoms. - Do not take NSAID medications (including, but not limited to, ibuprofen, advil, motrin, naproxen, aleve, goody's powder, etc.) - Follow up with your doctor in the next week via telehealth or seek medical attention right away if your symptoms get WORSE.  - Consider donating plasma after you have recovered (either 14 days after a negative test or 28 days after symptoms have completely resolved) because your antibodies to this virus may be helpful to give to others with life-threatening infections. Please go to the website  www.oneblood.org if you would like to consider volunteering for plasma donation.    Directions for you at home:  Wear a facemask You should wear a facemask that covers your nose and mouth when you are in the same room with other people and when you visit a healthcare provider. People who live with or visit you should also wear a facemask while they are in the same room with you.  Separate yourself from other people in your home As much as possible, you should stay in a different room from other people in your home. Also, you should use a separate bathroom, if available.  Avoid sharing household items You should not share dishes, drinking glasses, cups, eating utensils, towels, bedding, or other items with other people in your home. After using these items, you should wash them thoroughly with soap and water.  Cover your coughs and sneezes Cover your mouth and nose with a tissue when you cough or sneeze, or you can cough or sneeze into your sleeve. Throw used tissues in a lined trash can, and immediately wash your hands with soap and water for at least 20 seconds or use an alcohol-based hand rub.  Wash your Proofreader  your hands often and thoroughly with soap and water for at least 20 seconds. You can use an alcohol-based hand sanitizer if soap and water are not available and if your hands are not visibly dirty. Avoid touching your eyes, nose, and mouth with unwashed hands.  Directions for those who live with, or provide care at home for you:  Limit the number of people who have contact with the patient If possible, have only one caregiver for the patient. Other household members should stay in another home or place of residence. If this is not possible, they should stay in another room, or be separated from the patient as much as possible. Use a separate bathroom, if available. Restrict visitors who do not have an essential need to be in the home.  Ensure good ventilation Make  sure that shared spaces in the home have good air flow, such as from an air conditioner or an opened window, weather permitting.  Wash your hands often Wash your hands often and thoroughly with soap and water for at least 20 seconds. You can use an alcohol based hand sanitizer if soap and water are not available and if your hands are not visibly dirty. Avoid touching your eyes, nose, and mouth with unwashed hands. Use disposable paper towels to dry your hands. If not available, use dedicated cloth towels and replace them when they become wet.  Wear a facemask and gloves Wear a disposable facemask at all times in the room and gloves when you touch or have contact with the patient's blood, body fluids, and/or secretions or excretions, such as sweat, saliva, sputum, nasal mucus, vomit, urine, or feces.  Ensure the mask fits over your nose and mouth tightly, and do not touch it during use. Throw out disposable facemasks and gloves after using them. Do not reuse. Wash your hands immediately after removing your facemask and gloves. If your personal clothing becomes contaminated, carefully remove clothing and launder. Wash your hands after handling contaminated clothing. Place all used disposable facemasks, gloves, and other waste in a lined container before disposing them with other household waste. Remove gloves and wash your hands immediately after handling these items.  Do not share dishes, glasses, or other household items with the patient Avoid sharing household items. You should not share dishes, drinking glasses, cups, eating utensils, towels, bedding, or other items with a patient who is confirmed to have, or being evaluated for, COVID-19 infection. After the person uses these items, you should wash them thoroughly with soap and water.  Wash laundry thoroughly Immediately remove and wash clothes or bedding that have blood, body fluids, and/or secretions or excretions, such as sweat, saliva,  sputum, nasal mucus, vomit, urine, or feces, on them. Wear gloves when handling laundry from the patient. Read and follow directions on labels of laundry or clothing items and detergent. In general, wash and dry with the warmest temperatures recommended on the label.  Clean all areas the individual has used often Clean all touchable surfaces, such as counters, tabletops, doorknobs, bathroom fixtures, toilets, phones, keyboards, tablets, and bedside tables, every day. Also, clean any surfaces that may have blood, body fluids, and/or secretions or excretions on them. Wear gloves when cleaning surfaces the patient has come in contact with. Use a diluted bleach solution (e.g., dilute bleach with 1 part bleach and 10 parts water) or a household disinfectant with a label that says EPA-registered for coronaviruses. To make a bleach solution at home, add 1 tablespoon of bleach to 1  quart (4 cups) of water. For a larger supply, add  cup of bleach to 1 gallon (16 cups) of water. Read labels of cleaning products and follow recommendations provided on product labels. Labels contain instructions for safe and effective use of the cleaning product including precautions you should take when applying the product, such as wearing gloves or eye protection and making sure you have good ventilation during use of the product. Remove gloves and wash hands immediately after cleaning.  Monitor yourself for signs and symptoms of illness Caregivers and household members are considered close contacts, should monitor their health, and will be asked to limit movement outside of the home to the extent possible. Follow the monitoring steps for close contacts listed on the symptom monitoring form.   If you have additional questions, contact your local health department or call the epidemiologist on call at [phone] (available 24/7). This guidance is subject to change. For the most up-to-date guidance from [facility], please refer to  their website: YouBlogs.pl   You were cared for by a hospitalist during your hospital stay. If you have any questions about your discharge medications or the care you received while you were in the hospital after you are discharged, you can call the unit and asked to speak with the hospitalist on call if the hospitalist that took care of you is not available. Once you are discharged, your primary care physician will handle any further medical issues. Please note that NO REFILLS for any discharge medications will be authorized once you are discharged, as it is imperative that you return to your primary care physician (or establish a relationship with a primary care physician if you do not have one) for your aftercare needs so that they can reassess your need for medications and monitor your lab values. If you do not have a primary care physician, you can call [phone] for a physician referral.   Increase activity slowly   Complete by: As directed         Allergies as of 10/20/2019      Reactions   Oxycodone Shortness Of Breath   Cymbalta [duloxetine Hcl]    Loopy feeling   Empagliflozin Itching   Whole body itching   Entresto [sacubitril-valsartan] Other (See Comments)   Impaired coordination---causes patient to drop many things, blood pressure bottomed out   Nortriptyline    Legs jumping      Medication List    STOP taking these medications   carvedilol 12.5 MG tablet Commonly known as: COREG   losartan 25 MG tablet Commonly known as: COZAAR   spironolactone 25 MG tablet Commonly known as: ALDACTONE     TAKE these medications   albuterol (2.5 MG/3ML) 0.083% nebulizer solution Commonly known as: PROVENTIL Take 3 mLs by nebulization every 4 (four) hours as needed for wheezing.   albuterol 108 (90 Base) MCG/ACT inhaler Commonly known as: VENTOLIN HFA Inhale 2 puffs into the lungs every 6 (six) hours as needed for  wheezing or shortness of breath.   allopurinol 100 MG tablet Commonly known as: ZYLOPRIM Take 100 mg by mouth daily.   aspirin EC 81 MG tablet Take 162 mg by mouth daily.   atorvastatin 20 MG tablet Commonly known as: LIPITOR TAKE 1 TABLET BY MOUTH EVERY DAY What changed: when to take this   Breo Ellipta 200-25 MCG/INH Aepb Generic drug: fluticasone furoate-vilanterol Inhale 1 puff into the lungs daily.   colchicine 0.6 MG tablet Take 0.6 mg by mouth as needed (gout).  digoxin 0.125 MG tablet Commonly known as: LANOXIN TAKE 0.5 TABLETS (62.5 MCG TOTAL) BY MOUTH DAILY. What changed: additional instructions   HYDROcodone-acetaminophen 7.5-325 MG tablet Commonly known as: NORCO Take 1 tablet by mouth every 6 (six) hours as needed for pain.   levothyroxine 25 MCG tablet Commonly known as: SYNTHROID Take 25 mcg by mouth daily before breakfast.   metFORMIN 500 MG 24 hr tablet Commonly known as: GLUCOPHAGE-XR Take 500 mg by mouth 2 (two) times daily.   nortriptyline 10 MG capsule Commonly known as: PAMELOR Take 10 mg by mouth at bedtime.   pregabalin 100 MG capsule Commonly known as: LYRICA Take 100 mg by mouth every evening.   torsemide 20 MG tablet Commonly known as: DEMADEX Take 1 tablet (20 mg total) by mouth daily. Start taking on: October 27, 2019 What changed:   how much to take  These instructions start on October 27, 2019. If you are unsure what to do until then, ask your doctor or other care provider.   Trulicity 1.5 0000000 Sopn Generic drug: Dulaglutide Inject 1.5 mg into the skin once a week. Inject every Friday   VITAMIN B 12 PO Take by mouth. Gummy   Vyndamax 61 MG Caps Generic drug: Tafamidis Take 61 mg by mouth daily.            Durable Medical Equipment  (From admission, onward)         Start     Ordered   10/18/19 1450  For home use only DME oxygen  Once    Question Answer Comment  Length of Need 6 Months   Mode or (Route)  Nasal cannula   Liters per Minute 2   Frequency Continuous (stationary and portable oxygen unit needed)   Oxygen conserving device Yes   Oxygen delivery system Gas      10/18/19 1449           Follow-up Information    Kelton Pillar, MD. Schedule an appointment as soon as possible for a visit in 2 week(s).   Specialty: Family Medicine Contact information: [address] [place] [address] [phone]        Home, [facility] Follow up.   Specialty: [place] Why: [facility] staff will call you to schedule your home therapy visits Contact information: [place]  [address] [phone]           TOTAL DISCHARGE TIME: 71 minutes  [place]  [facility] Pager on [email or web address]  10/20/2019, 6:03 PM

## 2019-10-20 NOTE — Progress Notes (Signed)
SATURATION QUALIFICATIONS: (This note is used to comply with regulatory documentation for home oxygen)  Patient Saturations on Room Air at Rest = 88%  Patient Saturations on Room Air while Ambulating = 84%  Patient Saturations on 3 Liters of oxygen while Ambulating = 91%  Please briefly explain why patient needs home oxygen: Needs supplemental O2 at rest and with activity to maintain adequate oxygenation.

## 2019-10-23 ENCOUNTER — Encounter (HOSPITAL_COMMUNITY): Payer: Medicare Other | Admitting: Cardiology

## 2019-10-26 ENCOUNTER — Inpatient Hospital Stay (HOSPITAL_COMMUNITY)
Admission: EM | Admit: 2019-10-26 | Discharge: 2019-10-27 | DRG: 208 | Disposition: E | Payer: Medicare Other | Attending: Internal Medicine | Admitting: Internal Medicine

## 2019-10-26 ENCOUNTER — Emergency Department (HOSPITAL_COMMUNITY): Payer: Medicare Other

## 2019-10-26 DIAGNOSIS — J9601 Acute respiratory failure with hypoxia: Secondary | ICD-10-CM | POA: Diagnosis present

## 2019-10-26 DIAGNOSIS — J1282 Pneumonia due to coronavirus disease 2019: Secondary | ICD-10-CM | POA: Diagnosis present

## 2019-10-26 DIAGNOSIS — Z95810 Presence of automatic (implantable) cardiac defibrillator: Secondary | ICD-10-CM | POA: Diagnosis not present

## 2019-10-26 DIAGNOSIS — D696 Thrombocytopenia, unspecified: Secondary | ICD-10-CM | POA: Diagnosis present

## 2019-10-26 DIAGNOSIS — R040 Epistaxis: Secondary | ICD-10-CM | POA: Diagnosis present

## 2019-10-26 DIAGNOSIS — J449 Chronic obstructive pulmonary disease, unspecified: Secondary | ICD-10-CM | POA: Diagnosis present

## 2019-10-26 DIAGNOSIS — Z7982 Long term (current) use of aspirin: Secondary | ICD-10-CM

## 2019-10-26 DIAGNOSIS — D649 Anemia, unspecified: Secondary | ICD-10-CM | POA: Diagnosis present

## 2019-10-26 DIAGNOSIS — Z79890 Hormone replacement therapy (postmenopausal): Secondary | ICD-10-CM | POA: Diagnosis not present

## 2019-10-26 DIAGNOSIS — I2510 Atherosclerotic heart disease of native coronary artery without angina pectoris: Secondary | ICD-10-CM | POA: Diagnosis present

## 2019-10-26 DIAGNOSIS — I5042 Chronic combined systolic (congestive) and diastolic (congestive) heart failure: Secondary | ICD-10-CM | POA: Diagnosis present

## 2019-10-26 DIAGNOSIS — Z7984 Long term (current) use of oral hypoglycemic drugs: Secondary | ICD-10-CM

## 2019-10-26 DIAGNOSIS — I110 Hypertensive heart disease with heart failure: Secondary | ICD-10-CM | POA: Diagnosis present

## 2019-10-26 DIAGNOSIS — Z79899 Other long term (current) drug therapy: Secondary | ICD-10-CM | POA: Diagnosis not present

## 2019-10-26 DIAGNOSIS — U071 COVID-19: Secondary | ICD-10-CM | POA: Diagnosis present

## 2019-10-26 DIAGNOSIS — Z87891 Personal history of nicotine dependence: Secondary | ICD-10-CM

## 2019-10-26 DIAGNOSIS — E1151 Type 2 diabetes mellitus with diabetic peripheral angiopathy without gangrene: Secondary | ICD-10-CM | POA: Diagnosis present

## 2019-10-26 DIAGNOSIS — I10 Essential (primary) hypertension: Secondary | ICD-10-CM | POA: Diagnosis present

## 2019-10-26 DIAGNOSIS — E785 Hyperlipidemia, unspecified: Secondary | ICD-10-CM | POA: Diagnosis present

## 2019-10-26 DIAGNOSIS — Z885 Allergy status to narcotic agent status: Secondary | ICD-10-CM | POA: Diagnosis not present

## 2019-10-26 DIAGNOSIS — E1159 Type 2 diabetes mellitus with other circulatory complications: Secondary | ICD-10-CM | POA: Diagnosis present

## 2019-10-26 DIAGNOSIS — I959 Hypotension, unspecified: Secondary | ICD-10-CM | POA: Diagnosis present

## 2019-10-26 DIAGNOSIS — R579 Shock, unspecified: Secondary | ICD-10-CM | POA: Diagnosis not present

## 2019-10-26 DIAGNOSIS — Z888 Allergy status to other drugs, medicaments and biological substances status: Secondary | ICD-10-CM

## 2019-10-26 DIAGNOSIS — Z8249 Family history of ischemic heart disease and other diseases of the circulatory system: Secondary | ICD-10-CM

## 2019-10-26 DIAGNOSIS — R042 Hemoptysis: Secondary | ICD-10-CM | POA: Diagnosis present

## 2019-10-26 DIAGNOSIS — D6959 Other secondary thrombocytopenia: Secondary | ICD-10-CM | POA: Diagnosis present

## 2019-10-26 DIAGNOSIS — I469 Cardiac arrest, cause unspecified: Secondary | ICD-10-CM | POA: Diagnosis not present

## 2019-10-26 DIAGNOSIS — Z7951 Long term (current) use of inhaled steroids: Secondary | ICD-10-CM

## 2019-10-26 LAB — COMPREHENSIVE METABOLIC PANEL
ALT: 25 U/L (ref 0–44)
AST: 23 U/L (ref 15–41)
Albumin: 2.2 g/dL — ABNORMAL LOW (ref 3.5–5.0)
Alkaline Phosphatase: 58 U/L (ref 38–126)
Anion gap: 10 (ref 5–15)
BUN: 22 mg/dL (ref 8–23)
CO2: 29 mmol/L (ref 22–32)
Calcium: 8.3 mg/dL — ABNORMAL LOW (ref 8.9–10.3)
Chloride: 96 mmol/L — ABNORMAL LOW (ref 98–111)
Creatinine, Ser: 1.27 mg/dL — ABNORMAL HIGH (ref 0.44–1.00)
GFR calc Af Amer: 49 mL/min — ABNORMAL LOW (ref >60)
GFR calc non Af Amer: 42 mL/min — ABNORMAL LOW (ref >60)
Glucose, Bld: 214 mg/dL — ABNORMAL HIGH (ref 70–99)
Potassium: 4.4 mmol/L (ref 3.5–5.1)
Sodium: 135 mmol/L (ref 135–145)
Total Bilirubin: 0.6 mg/dL (ref 0.3–1.2)
Total Protein: 6.7 g/dL (ref 6.5–8.1)

## 2019-10-26 LAB — CBC WITH DIFFERENTIAL/PLATELET
Abs Immature Granulocytes: 0.1 10*3/uL — ABNORMAL HIGH (ref 0.00–0.07)
Abs Immature Granulocytes: 0 10*3/uL (ref 0.00–0.07)
Basophils Absolute: 0 10*3/uL (ref 0.0–0.1)
Basophils Absolute: 0 10*3/uL (ref 0.0–0.1)
Basophils Relative: 0 %
Basophils Relative: 0 %
Eosinophils Absolute: 0.1 10*3/uL (ref 0.0–0.5)
Eosinophils Absolute: 0.1 10*3/uL (ref 0.0–0.5)
Eosinophils Relative: 1 %
Eosinophils Relative: 1 %
HCT: 33.6 % — ABNORMAL LOW (ref 36.0–46.0)
HCT: 37.2 % (ref 36.0–46.0)
Hemoglobin: 10.7 g/dL — ABNORMAL LOW (ref 12.0–15.0)
Hemoglobin: 11.6 g/dL — ABNORMAL LOW (ref 12.0–15.0)
Immature Granulocytes: 1 %
Lymphocytes Relative: 13 %
Lymphocytes Relative: 23 %
Lymphs Abs: 1 10*3/uL (ref 0.7–4.0)
Lymphs Abs: 1.9 10*3/uL (ref 0.7–4.0)
MCH: 32.2 pg (ref 26.0–34.0)
MCH: 32 pg (ref 26.0–34.0)
MCHC: 31.8 g/dL (ref 30.0–36.0)
MCHC: 31.2 g/dL (ref 30.0–36.0)
MCV: 101.2 fL — ABNORMAL HIGH (ref 80.0–100.0)
MCV: 102.5 fL — ABNORMAL HIGH (ref 80.0–100.0)
Monocytes Absolute: 0.6 10*3/uL (ref 0.1–1.0)
Monocytes Absolute: 0.9 10*3/uL (ref 0.1–1.0)
Monocytes Relative: 7 %
Monocytes Relative: 11 %
Neutro Abs: 5.9 10*3/uL (ref 1.7–7.7)
Neutro Abs: 5.3 10*3/uL (ref 1.7–7.7)
Neutrophils Relative %: 78 %
Neutrophils Relative %: 65 %
Platelets: <5 10*3/uL — CL (ref 150–400)
Platelets: <5 10*3/uL — CL (ref 150–400)
RBC: 3.32 MIL/uL — ABNORMAL LOW (ref 3.87–5.11)
RBC: 3.63 MIL/uL — ABNORMAL LOW (ref 3.87–5.11)
RDW: 14.5 % (ref 11.5–15.5)
RDW: 14.4 % (ref 11.5–15.5)
WBC: 7.7 10*3/uL (ref 4.0–10.5)
WBC: 8.1 10*3/uL (ref 4.0–10.5)
nRBC: 0 % (ref 0.0–0.2)
nRBC: 0 % (ref 0.0–0.2)

## 2019-10-26 LAB — BRAIN NATRIURETIC PEPTIDE: B Natriuretic Peptide: 376 pg/mL — ABNORMAL HIGH (ref 0.0–100.0)

## 2019-10-26 LAB — PROCALCITONIN: Procalcitonin: 0.18 ng/mL

## 2019-10-26 LAB — C-REACTIVE PROTEIN: CRP: 9.2 mg/dL — ABNORMAL HIGH (ref <1.0)

## 2019-10-26 LAB — LACTATE DEHYDROGENASE: LDH: 200 U/L — ABNORMAL HIGH (ref 98–192)

## 2019-10-26 LAB — TROPONIN I (HIGH SENSITIVITY)
Troponin I (High Sensitivity): 43 ng/L — ABNORMAL HIGH (ref <18)
Troponin I (High Sensitivity): 42 ng/L — ABNORMAL HIGH (ref <18)

## 2019-10-26 LAB — FERRITIN: Ferritin: 152 ng/mL (ref 11–307)

## 2019-10-26 LAB — LACTIC ACID, PLASMA: Lactic Acid, Venous: 1.6 mmol/L (ref 0.5–1.9)

## 2019-10-26 MED ORDER — SODIUM CHLORIDE 0.9 % IV BOLUS (SEPSIS)
500.0000 mL | Freq: Once | INTRAVENOUS | Status: AC
  Administered 2019-10-26: 500 mL via INTRAVENOUS

## 2019-10-26 MED ORDER — MIDAZOLAM HCL 2 MG/2ML IJ SOLN
INTRAMUSCULAR | Status: AC
  Filled 2019-10-26: qty 4

## 2019-10-26 MED ORDER — SODIUM CHLORIDE 0.9% FLUSH: 3.0000 mL | Freq: Two times a day (BID) | INTRAVENOUS | Status: DC

## 2019-10-26 MED ORDER — SODIUM CHLORIDE 0.9 % IV SOLN: 250.0000 mL | INTRAVENOUS | Status: DC | PRN

## 2019-10-26 MED ORDER — FENTANYL CITRATE (PF) 100 MCG/2ML IJ SOLN: 100.0000 ug | Freq: Once | INTRAMUSCULAR | Status: DC

## 2019-10-26 MED ORDER — ARGATROBAN 50 MG/50ML IV SOLN
0.5000 ug/kg/min | INTRAVENOUS | Status: DC
  Filled 2019-10-26: qty 50

## 2019-10-26 MED ORDER — IMMUNE GLOBULIN (HUMAN) 10 GM/100ML IV SOLN: 1.0000 g/kg | INTRAVENOUS | Status: DC

## 2019-10-26 MED ORDER — ROCURONIUM BROMIDE 50 MG/5ML IV SOLN
INTRAVENOUS | Status: AC | PRN
  Administered 2019-10-26: 75 mg via INTRAVENOUS

## 2019-10-26 MED ORDER — ALBUTEROL SULFATE HFA 108 (90 BASE) MCG/ACT IN AERS: 6.0000 | INHALATION_SPRAY | Freq: Once | RESPIRATORY_TRACT | Status: DC

## 2019-10-26 MED ORDER — FENTANYL CITRATE (PF) 100 MCG/2ML IJ SOLN
INTRAMUSCULAR | Status: AC
  Filled 2019-10-26: qty 2

## 2019-10-26 MED ORDER — EPINEPHRINE 1 MG/10ML IJ SOSY
PREFILLED_SYRINGE | INTRAMUSCULAR | Status: AC | PRN
  Administered 2019-10-26 (×3): 1 mg via INTRAVENOUS

## 2019-10-26 MED ORDER — NOREPINEPHRINE BITARTRATE 1 MG/ML IV SOLN
INTRAVENOUS | Status: AC | PRN
  Administered 2019-10-26: 20 ug/kg/min via INTRAVENOUS

## 2019-10-26 MED ORDER — SODIUM CHLORIDE 0.9% IV SOLUTION: Freq: Once | INTRAVENOUS | Status: AC

## 2019-10-26 MED ORDER — MIDAZOLAM HCL 2 MG/2ML IJ SOLN: 2.0000 mg | Freq: Once | INTRAMUSCULAR | Status: DC

## 2019-10-26 MED ORDER — SODIUM CHLORIDE 0.9% FLUSH: 3.0000 mL | INTRAVENOUS | Status: DC | PRN

## 2019-10-26 MED ORDER — ETOMIDATE 2 MG/ML IV SOLN
INTRAVENOUS | Status: AC | PRN
  Administered 2019-10-26: 20 mg via INTRAVENOUS

## 2019-10-26 MED ORDER — SODIUM BICARBONATE 8.4 % IV SOLN
INTRAVENOUS | Status: AC | PRN
  Administered 2019-10-26 (×3): 50 meq via INTRAVENOUS

## 2019-10-27 LAB — BPAM PLATELET PHERESIS
Blood Product Expiration Date: 202102012359
Blood Product Expiration Date: 202102022359
ISSUE DATE / TIME: 202101311655
Unit Type and Rh: 7300
Unit Type and Rh: 9500

## 2019-10-27 LAB — PREPARE PLATELET PHERESIS
Unit division: 0
Unit division: 0

## 2019-10-27 LAB — HEPARIN INDUCED PLATELET AB (HIT ANTIBODY): Heparin Induced Plt Ab: 0.186 OD (ref 0.000–0.400)

## 2019-10-27 MED FILL — Medication: Qty: 1 | Status: AC

## 2019-10-27 NOTE — ED Provider Notes (Addendum)
[facility] EMERGENCY DEPARTMENT Provider Note   CSN: [id number] Arrival date & time: 2019/10/27  1300     History Chief Complaint  Patient presents with  . Shortness of Breath    covid positive    Lisa [name] is a 74 y.o. female.  Patient complains of some shortness of breath.  Patient has a diagnosis of Covid and was just recently discharged from the hospital but now is hypoxic.  The history is provided by the patient. No language interpreter was used.  Shortness of Breath Severity:  Moderate Onset quality:  Gradual Timing:  Constant Progression:  Worsening Chronicity:  Recurrent Context: not activity   Relieved by:  Nothing Worsened by:  Nothing Associated symptoms: no abdominal pain, no chest pain, no cough, no headaches and no rash        Past Medical History:  Diagnosis Date  . AICD (automatic cardioverter/defibrillator) present 2014   Biotroniks  . Arthritis    knees with arthritis as well as has back, pt. remarks that her hands fall asleep when she is in the bed or in certain positions   . Bell's palsy   . Carotid artery disease ([place])    a. duplex - moderate bilateral carotid disease (60-79% 06/2017).  . Chronic systolic CHF (congestive heart failure) ([place])   . COPD (chronic obstructive pulmonary disease) ([place])   . COVID-19 10/06/2019   rapid test positive at local urgent care  . Diabetes mellitus without complication (HCC)    borderline  . Diabetic neuropathy ([place]) 05/26/2019  . Dyspnea   . Former tobacco use   . GERD (gastroesophageal reflux disease)    uses otc for occas. heartburn   . Hyperlipidemia   . Hypertension   . Mild CAD    a. nonobstructive CAD (50% PLOM 08/2017).  . Neuropathy   . Non-ischemic cardiomyopathy ([place])   . PAD (peripheral artery disease) ([place])    a.  s/p L-R fem-fem BPG 2016.  [name] Pneumonia    hosp. in [place]-2015  . Subclavian steal syndrome    a. h/o left subclavian steal.    Patient Active  Problem List   Diagnosis Date Noted  . Hypoxia   . COVID-19 10/06/2019  . Diabetic neuropathy ([place]) 05/26/2019  . Acute on chronic combined systolic and diastolic heart failure ([place]) 10/09/2018  . COPD (chronic obstructive pulmonary disease) ([place]) 10/09/2018  . Wild-type transthyretin-related (ATTR) amyloidosis ([place])   . Chest pain 07/27/2018  . Hyperglycemia 07/27/2018  . 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 Community acquired pneumonia 03/18/2018  . Acute on chronic systolic CHF (congestive heart failure) ([place]) 09/20/2017  . Mild CAD 09/20/2017  . COPD exacerbation ([place]) 09/20/2017  . Wide-complex tachycardia ([place]) 09/20/2017  . Moderate mitral regurgitation 09/20/2017  . Moderate tricuspid regurgitation 09/20/2017  . Acute on chronic systolic (congestive) heart failure ([place]) 09/20/2017  . Acute on chronic combined systolic and diastolic CHF (congestive heart failure) ([place]) 04/09/2017  . Non-ischemic cardiomyopathy ([place]) 04/09/2017  . Dyslipidemia 04/09/2017  . Cardiac defibrillator in place 04/09/2017  . Essential hypertension 04/09/2017  . Discoloration of skin of toe-Right 3rd Toe 12/31/2014  . Wound drainage-Right Groin 12/31/2014  . Right leg swelling 12/31/2014  . Pain in joint, ankle and foot 12/31/2014  . 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 Paresthesia of both hands 03/19/2014  . Numbness- Right foot 03/19/2014  . Right foot pain 03/19/2014  . Peripheral vascular disease (  [place]) 12/26/2012  . Occlusion and stenosis of carotid artery without mention of cerebral infarction 11/14/2012    Past Surgical History:  Procedure Laterality Date  . ABDOMINAL AORTAGRAM N/A 11/27/2014   Procedure: ABDOMINAL Maxcine Ham;  Surgeon: Elam Dutch, MD;  Location: [facility] CATH LAB;  Service: Cardiovascular;  Laterality: N/A;  . ABDOMINAL HYSTERECTOMY    . APPENDECTOMY    . CARDIAC CATHETERIZATION      02/20/13: Normal coronaries, moderate LV dysfunction, EF 35%. Medical RX ([facility])  . CARDIAC DEFIBRILLATOR PLACEMENT  Aug. 2014   dual chamber [employer]ot[employer] ICD 05/06/13 ([facility], Dr. Minna Merritts)  . ENDARTERECTOMY FEMORAL Right 12/21/2014   Procedure: ENDARTERECTOMY FEMORAL;  Surgeon: Elam Dutch, MD;  Location: [facility];  Service: Vascular;  Laterality: Right;  . FEMORAL-FEMORAL BYPASS GRAFT Bilateral 12/21/2014   Procedure: BYPASS GRAFT LEFT FEMORAL-RIGHT FEMORAL ARTERY;  Surgeon: Elam Dutch, MD;  Location: [facility];  Service: Vascular;  Laterality: Bilateral;  . JOINT REPLACEMENT Bilateral    implants  . RIGHT/LEFT HEART CATH AND CORONARY ANGIOGRAPHY N/A 08/28/2017   Procedure: RIGHT/LEFT HEART CATH AND CORONARY ANGIOGRAPHY;  Surgeon: Larey Dresser, MD;  Location: [facility] CV LAB;  Service: Cardiovascular;  Laterality: N/A;  . TUBAL LIGATION       OB History   No obstetric history on file.     Family History  Problem Relation Age of Onset  . Heart disease Mother   . Hypertension Mother     Social History   Tobacco Use  . Smoking status: Former Smoker    Types: Cigarettes    Quit date: 11/14/2010    Years since quitting: 8.9  . Smokeless tobacco: Never Used  Substance Use Topics  . Alcohol use: No    Alcohol/week: 0.0 standard drinks  . Drug use: No    Home Medications Prior to Admission medications   Medication Sig Start Date End Date Taking? Authorizing Provider  albuterol (PROVENTIL HFA;VENTOLIN HFA) 108 (90 Base) MCG/ACT inhaler Inhale 2 puffs into the lungs every 6 (six) hours as needed for wheezing or shortness of breath. 03/20/18   Lockamy, Timothy, DO  albuterol (PROVENTIL) (2.5 MG/3ML) 0.083% nebulizer solution Take 3 mLs by nebulization every 4 (four) hours as needed for wheezing. 08/01/17   [provider]  allopurinol (ZYLOPRIM) 100 MG tablet Take 100 mg by mouth daily.    [provider]  aspirin EC 81 MG tablet Take 162 mg by mouth daily.     [provider]  atorvastatin (LIPITOR) 20 MG tablet TAKE 1 TABLET BY MOUTH EVERY DAY Patient taking differently: Take 20 mg by mouth daily at 6 PM.  09/01/19   Larey Dresser, MD  colchicine 0.6 MG tablet Take 0.6 mg by mouth as needed (gout).  09/01/19   [provider]  Cyanocobalamin (VITAMIN B 12 PO) Take by mouth. Gummy    [provider]  digoxin (LANOXIN) 0.125 MG tablet TAKE 0.5 TABLETS (62.5 MCG TOTAL) BY MOUTH DAILY. Patient taking differently: Take 0.0625 mg by mouth daily. 1/2 tab daily 10/03/19   Larey Dresser, MD  fluticasone furoate-vilanterol (BREO ELLIPTA) 200-25 MCG/INH AEPB Inhale 1 puff into the lungs daily.    [provider]  HYDROcodone-acetaminophen (NORCO) 7.5-325 MG tablet Take 1 tablet by mouth every 6 (six) hours as needed for pain.    [provider]  levothyroxine (SYNTHROID, LEVOTHROID) 25 MCG tablet Take 25 mcg by mouth daily before breakfast.    [provider]  metFORMIN (  GLUCOPHAGE-XR) 500 MG 24 hr tablet Take 500 mg by mouth 2 (two) times daily. 10/03/18   [provider]  nortriptyline (PAMELOR) 10 MG capsule Take 10 mg by mouth at bedtime.     [provider]  pregabalin (LYRICA) 100 MG capsule Take 100 mg by mouth every evening.     [provider]  Tafamidis (VYNDAMAX) 61 MG CAPS Take 61 mg by mouth daily. 10/03/19   Larey Dresser, MD  torsemide (DEMADEX) 20 MG tablet Take 1 tablet (20 mg total) by mouth daily. 10/27/19   Bonnielee Haff, MD  TRULICITY 1.5 0000000 SOPN Inject 1.5 mg into the skin once a week. Inject every Friday 05/29/19   [provider]    Allergies    Oxycodone, Cymbalta [duloxetine hcl], Empagliflozin, Entresto [sacubitril-valsartan], and Nortriptyline  Review of Systems   Review of Systems  Constitutional: Negative for appetite change and fatigue.  HENT: Negative for congestion, ear discharge and sinus pressure.   Eyes: Negative for discharge.    Respiratory: Positive for shortness of breath. Negative for cough.   Cardiovascular: Negative for chest pain.  Gastrointestinal: Negative for abdominal pain and diarrhea.  Genitourinary: Negative for frequency and hematuria.  Musculoskeletal: Negative for back pain.  Skin: Negative for rash.  Neurological: Negative for seizures and headaches.  Psychiatric/Behavioral: Negative for hallucinations.    Physical Exam Updated Vital Signs BP (!) 101/55   Pulse 61   Temp 99.7 F (37.6 C) (Oral)   Resp (!) 31   SpO2 99%   Physical Exam Vitals and nursing note reviewed.  Constitutional:      Appearance: She is well-developed.  HENT:     Head: Normocephalic.     Nose: Nose normal.  Eyes:     General: No scleral icterus.    Conjunctiva/sclera: Conjunctivae normal.  Neck:     Thyroid: No thyromegaly.  Cardiovascular:     Rate and Rhythm: Normal rate and regular rhythm.     Heart sounds: No murmur. No friction rub. No gallop.   Pulmonary:     Breath sounds: No stridor. No wheezing or rales.  Chest:     Chest wall: No tenderness.  Abdominal:     General: There is no distension.     Tenderness: There is no abdominal tenderness. There is no rebound.  Musculoskeletal:        General: Normal range of motion.     Cervical back: Neck supple.  Lymphadenopathy:     Cervical: No cervical adenopathy.  Skin:    Findings: No erythema or rash.  Neurological:     Mental Status: She is oriented to person, place, and time.     Motor: No abnormal muscle tone.     Coordination: Coordination normal.  Psychiatric:        Behavior: Behavior normal.     ED Results / Procedures / Treatments   Labs (all labs ordered are listed, but only abnormal results are displayed) Labs Reviewed  COMPREHENSIVE METABOLIC PANEL - Abnormal; Notable for the following components:      Result Value   Chloride 96 (*)    Glucose, Bld 214 (*)    Creatinine, Ser 1.27 (*)    Calcium 8.3 (*)    Albumin 2.2 (*)     GFR calc non Af Amer 42 (*)    GFR calc Af Amer 49 (*)    All other components within normal limits  TROPONIN I (HIGH SENSITIVITY) - Abnormal; Notable for the following  components:   Troponin I (High Sensitivity) 43 (*)    All other components within normal limits  LACTIC ACID, PLASMA  CBC WITH DIFFERENTIAL/PLATELET  BRAIN NATRIURETIC PEPTIDE  CBC WITH DIFFERENTIAL/PLATELET  TROPONIN I (HIGH SENSITIVITY)    EKG None  Radiology DG Chest Portable 1 View  Result Date: 11-12-2019 CLINICAL DATA:  Shortness of breath. Patient recently discharged from the hospital with COVID-19 pneumonia. EXAM: PORTABLE CHEST 1 VIEW COMPARISON:  October 14, 2019 FINDINGS: Bilateral pulmonary infiltrates are identified, right greater than left. The previously identified infiltrates on the left are significantly improved. The infiltrate on the right has worsened. Stable cardiomegaly and AICD device. No other interval changes. IMPRESSION: Worsening infiltrate/pneumonia on the right. Improved infiltrate on the left. Electronically Signed   By: Dorise Bullion III M.D   On: November 12, 2019 13:40    Procedures Procedures (including critical care time)  Medications Ordered in ED Medications  sodium chloride 0.9 % bolus 500 mL (500 mLs Intravenous New Bag/Given 11-12-19 1512)  albuterol (VENTOLIN HFA) 108 (90 Base) MCG/ACT inhaler 6 puff (has no administration in time range)    ED Course  I have reviewed the triage vital signs and the nursing notes.  Pertinent labs & imaging results that were available during my care of the patient were reviewed by me and considered in my medical decision making (see chart for details). Trellis Moment MELEENA HERLONG was evaluated in Emergency Department on 11-12-19 for the symptoms described in the history of present illness. She was evaluated in the context of the global COVID-19 pandemic, which necessitated consideration that the patient might be at risk for infection with the  SARS-CoV-2 virus that causes COVID-19. Institutional protocols and algorithms that pertain to the evaluation of patients at risk for COVID-19 are in a state of rapid change based on information released by regulatory bodies including the CDC and federal and state organizations. These policies and algorithms were followed during the patient's care in the ED.    CRITICAL CARE Performed by: Milton Ferguson Total critical care time: 35 minutes Critical care time was exclusive of separately billable procedures and treating other patients. Critical care was necessary to treat or prevent imminent or life-threatening deterioration. Critical care was time spent personally by me on the following activities: development of treatment plan with patient and/or surrogate as well as nursing, discussions with consultants, evaluation of patient's response to treatment, examination of patient, obtaining history from patient or surrogate, ordering and performing treatments and interventions, ordering and review of laboratory studies, ordering and review of radiographic studies, pulse oximetry and re-evaluation of patient's condition.  MDM Rules/Calculators/A&P                      Patient with worsening Covid.  She will be admitted to medicine Final Clinical Impression(s) / ED Diagnoses Final diagnoses:  COVID-19    Rx / DC Orders ED Discharge Orders    None       Milton Ferguson, MD 2019-11-12 1541    Milton Ferguson, MD 11/12/19 1219

## 2019-10-27 NOTE — Death Summary Note (Signed)
Death Summary  Lisa [name] [id number] DOB: [birth year] DOA: 11/17/2019  PCP: Kelton Pillar, MD PCP/Office notified: by Secure Chat  Admit date: 2019-11-17 Date of Death: November 17, 2019  Final Diagnoses:  Principal Problem:   Acute hypoxemic respiratory failure due to COVID-19 [facility]) Active Problems:   Essential hypertension   Type 2 diabetes mellitus with vascular disease ([place])   COPD (chronic obstructive pulmonary disease) (HCC)   Thrombocytopenia (HCC)   Chronic combined systolic and diastolic CHF (congestive heart failure) ([place])   History of present illness:  [name] is a 74 y.o. female with medical history significant of Systolic CHF (WL79%), AICD, COPD, HTN, and DM2 who was hospitalized at [facility] for COVID-19 infection from 1/15-25.  She returned today with hypoxia, hypotension.   She reports feeling somewhat better at the time of d/c from [facility] but not that much.  Mostly, she was just eager to get home.  Since home, she has been weak, tired, unable to eat/drink.  She has cough and has had hemoptysis with the cough as well as nosebleeds.  She has not noticed a rash.  She has had chills and sweats without clear fever.  She was increasingly SOB from last night until today.   ED Course:  COVID.  Admitted, discharged, now needs O2, sats in 70s on RA.  Now on 4L and in 90s.  CXR with worsening R-sided infection, better on the left.  Given IVF and no other treatments.  Will order Albuterol.   Hospital Course:  Shortly after admission, the patient decompensated in the ER.  She developed acute respiratory failure with O2 sats in the 60s.  She was intubated but suffered 3 cardiac arrest events with ROSC.  Attempt to place central line was met by diffuse clots.  Patient arrested again and died.  Her daughter arrived and agreed with cessation of efforts.   Time: 35 minutes  Signed:  Karmen Bongo  [facility] 11-17-2019, 6:27 PM

## 2019-10-27 NOTE — Procedures (Signed)
Intubation Procedure Note Lisa [name] [id number] Sep 27, 1945  Procedure: Intubation Indications: Respiratory insufficiency  Procedure Details Consent: Unable to obtain consent because of emergent medical necessity. Time Out: Verified patient identification, verified procedure, site/side was marked, verified correct patient position, special equipment/implants available, medications/allergies/relevent history reviewed, required imaging and test results available.  Performed  Maximum sterile technique was used including cap, gloves, hand hygiene and mask. Gown (full COVID precautions) MAC    Evaluation Hemodynamic Status: BP stable throughout; O2 sats: stable throughout Patient's Current Condition: Stable Complications: No apparent complications Patient did tolerate procedure well. Chest X-ray ordered to verify placement.  CXR: pending.  Jennet Maduro 11/06/19

## 2019-10-27 NOTE — Consult Note (Addendum)
[place]  Telephone:(336) [place]  DOB: September 16, 1946  MR#: [id number]  CSN#: [id number]    Requesting Physician: [facility] Dr. Lorin Mercy   Patient Care Team: Kelton Pillar, MD as PCP - General (Family Medicine) Constance Haw, MD as PCP - Electrophysiology (Cardiology) Larey Dresser, MD as PCP - Cardiology (Cardiology) Gwenevere Ghazi, MD (Pulmonary Disease)  Reason for consult: severe thrombocytopenia   The interview was conducted through telephone with her nurse Maudie Mercury in her room, and I also talked to her daughter.   History of present illness:   Pt is a 74 yo female, with recent hospitalization for COVID pneumonia, 10/10/19-10/20/19, CHF with EF 20%, AICD, COPD, HTN, DM, who presented to [facility] ED for worsening dyspnea. She was found to be hypoxic and hypotensive in ED, and CXR showed worsening pneumonia in right lung, improved infiltrates in left. Lab showed plt<5K. Hem consult was called.   During her recent hospital admission, she was treated with remdesivir and steroids, her oxygen requirement was improved (1-3L/min). She also received lovenox for DVT prophylaxis, last dose on 10/20/2019. Her CBC was normal except mild macrocytosis. Plt was 427K on 10/19/18.   Per daughter, she initially did okay the first few days after recent hospital discharge, and went downhill about 2 days ago.  She is very fatigued, appetite is very poor, has not eating much.  She noticed bleeding from her gum and mouth since yesterday.  Her shortness of breath also got worse.  She finally called EMS and the patient was brought to [facility] emergency room.   MEDICAL HISTORY:  Past Medical History:  Diagnosis Date  . AICD (automatic cardioverter/defibrillator) present 2014   Biotroniks  . Arthritis    knees with arthritis as well as has back, pt. remarks that her hands fall asleep when she is in the bed or in certain  positions   . Bell's palsy   . Carotid artery disease ([place])    a. duplex - moderate bilateral carotid disease (60-79% 06/2017).  . Chronic systolic CHF (congestive heart failure) ([place])   . COPD (chronic obstructive pulmonary disease) ([place])   . COVID-19 10/06/2019   rapid test positive at local urgent care  . Diabetes mellitus without complication (HCC)    borderline  . Diabetic neuropathy ([place]) 05/26/2019  . Dyspnea   . Former tobacco use   . GERD (gastroesophageal reflux disease)    uses otc for occas. heartburn   . Hyperlipidemia   . Hypertension   . Mild CAD    a. nonobstructive CAD (50% PLOM 08/2017).  . Neuropathy   . Non-ischemic cardiomyopathy ([place])   . PAD (peripheral artery disease) ([place])    a.  s/p L-R fem-fem BPG 2016.  [name] Pneumonia    hosp. in [place]-2015  . Subclavian steal syndrome    a. h/o left subclavian steal.    SURGICAL HISTORY: Past Surgical History:  Procedure Laterality Date  . ABDOMINAL AORTAGRAM N/A 11/27/2014   Procedure: ABDOMINAL Maxcine Ham;  Surgeon: Elam Dutch, MD;  Location: [facility] CATH LAB;  Service: Cardiovascular;  Laterality: N/A;  . ABDOMINAL HYSTERECTOMY    . APPENDECTOMY    . CARDIAC CATHETERIZATION     02/20/13: Normal coronaries, moderate LV dysfunction, EF 35%. Medical RX ([facility])  . CARDIAC DEFIBRILLATOR PLACEMENT  Aug. 2014   dual chamber Biotronik ICD 05/06/13 ([facility], Dr. Minna Merritts)  . ENDARTERECTOMY FEMORAL Right 12/21/2014   Procedure: ENDARTERECTOMY  FEMORAL;  Surgeon: Elam Dutch, MD;  Location: [place];  Service: Vascular;  Laterality: Right;  . FEMORAL-FEMORAL BYPASS GRAFT Bilateral 12/21/2014   Procedure: BYPASS GRAFT LEFT FEMORAL-RIGHT FEMORAL ARTERY;  Surgeon: Elam Dutch, MD;  Location: [place];  Service: Vascular;  Laterality: Bilateral;  . JOINT REPLACEMENT Bilateral    implants  . RIGHT/LEFT HEART CATH AND CORONARY ANGIOGRAPHY N/A 08/28/2017   Procedure: RIGHT/LEFT HEART CATH AND CORONARY ANGIOGRAPHY;  Surgeon: Larey Dresser, MD;  Location: [place] CV LAB;  Service: Cardiovascular;  Laterality: N/A;  . TUBAL LIGATION      SOCIAL HISTORY: Social History   Socioeconomic History  . Marital status: Married    Spouse name: Not on file  . Number of children: Not on file  . Years of education: Not on file  . Highest education level: Not on file  Occupational History  . Not on file  Tobacco Use  . Smoking status: Former Smoker    Types: Cigarettes    Quit date: 11/14/2010    Years since quitting: 8.9  . Smokeless tobacco: Never Used  Substance and Sexual Activity  . Alcohol use: No    Alcohol/week: 0.0 standard drinks  . Drug use: No  . Sexual activity: Not on file  Other Topics Concern  . Not on file  Social History Narrative  . Not on file   Social Determinants of Health   Financial Resource Strain:   . Difficulty of Paying Living Expenses: Not on file  Food Insecurity:   . Worried About Charity fundraiser in the Last Year: Not on file  . Ran Out of Food in the Last Year: Not on file  Transportation Needs:   . Lack of Transportation (Medical): Not on file  . Lack of Transportation (Non-Medical): Not on file  Physical Activity:   . Days of Exercise per Week: Not on file  . Minutes of Exercise per Session: Not on file  Stress:   . Feeling of Stress : Not on file  Social Connections:   . Frequency of Communication with Friends and Family: Not on file  . Frequency of Social Gatherings with Friends and Family: Not on file  . Attends Religious Services: Not on file  . Active Member of Clubs or Organizations: Not on file  . Attends Archivist Meetings: Not on file  . Marital Status: Not on file  Intimate Partner Violence:   . Fear of Current or Ex-Partner: Not on file  . Emotionally Abused: Not on file  . Physically Abused: Not on file  . Sexually Abused: Not on file    FAMILY HISTORY: Family History  Problem Relation Age of Onset  . Heart disease Mother   .  Hypertension Mother     ALLERGIES:  is allergic to oxycodone; cymbalta [duloxetine hcl]; empagliflozin; entresto [sacubitril-valsartan]; and nortriptyline.  MEDICATIONS:  Current Facility-Administered Medications  Medication Dose Route Frequency Provider Last Rate Last Admin  . 0.9 %  sodium chloride infusion (Manually program via Guardrails IV Fluids)   Intravenous Once Karmen Bongo, MD      . 0.9 %  sodium chloride infusion  250 mL Intravenous PRN Karmen Bongo, MD      . albuterol (VENTOLIN HFA) 108 (90 Base) MCG/ACT inhaler 6 puff  6 puff Inhalation Once Milton Ferguson, MD      . sodium chloride flush (NS) 0.9 % injection 3 mL  3 mL Intravenous Q12H Karmen Bongo, MD      .  sodium chloride flush (NS) 0.9 % injection 3 mL  3 mL Intravenous PRN Karmen Bongo, MD       Current Outpatient Medications  Medication Sig Dispense Refill  . albuterol (PROVENTIL HFA;VENTOLIN HFA) 108 (90 Base) MCG/ACT inhaler Inhale 2 puffs into the lungs every 6 (six) hours as needed for wheezing or shortness of breath. 1 Inhaler 6  . albuterol (PROVENTIL) (2.5 MG/3ML) 0.083% nebulizer solution Take 3 mLs by nebulization every 4 (four) hours as needed for wheezing.    [name] allopurinol (ZYLOPRIM) 100 MG tablet Take 100 mg by mouth daily.    [name] aspirin EC 81 MG tablet Take 162 mg by mouth daily.    [name] atorvastatin (LIPITOR) 20 MG tablet TAKE 1 TABLET BY MOUTH EVERY DAY (Patient taking differently: Take 20 mg by mouth daily at 6 PM. ) 90 tablet 1  . colchicine 0.6 MG tablet Take 0.6 mg by mouth as needed (gout).     . Cyanocobalamin (VITAMIN B 12 PO) Take by mouth. Gummy    . digoxin (LANOXIN) 0.125 MG tablet TAKE 0.5 TABLETS (62.5 MCG TOTAL) BY MOUTH DAILY. (Patient taking differently: Take 0.0625 mg by mouth daily. 1/2 tab daily) 45 tablet 0  . fluticasone furoate-vilanterol (BREO ELLIPTA) 200-25 MCG/INH AEPB Inhale 1 puff into the lungs daily.    [name] HYDROcodone-acetaminophen (NORCO) 7.5-325 MG tablet Take 1  tablet by mouth every 6 (six) hours as needed for pain.    [name] levothyroxine (SYNTHROID, LEVOTHROID) 25 MCG tablet Take 25 mcg by mouth daily before breakfast.    . metFORMIN (GLUCOPHAGE-XR) 500 MG 24 hr tablet Take 500 mg by mouth 2 (two) times daily.    . nortriptyline (PAMELOR) 10 MG capsule Take 10 mg by mouth at bedtime.     . pregabalin (LYRICA) 100 MG capsule Take 100 mg by mouth every evening.     . Tafamidis (VYNDAMAX) 61 MG CAPS Take 61 mg by mouth daily. 30 capsule 11  . [START ON 10/27/2019] torsemide (DEMADEX) 20 MG tablet Take 1 tablet (20 mg total) by mouth daily. 30 tablet 0  . TRULICITY 1.5 0000000 SOPN Inject 1.5 mg into the skin once a week. Inject every Friday      PHYSICAL EXAMINATION:  Vitals:   13-Nov-2019 1400 13-Nov-2019 1430  BP: (!) 80/64 (!) 101/55  Pulse: 66 61  Resp: (!) 23 (!) 31  Temp:    SpO2: 96% 99%   There were no vitals filed for this visit.   Exam not performed   LABORATORY DATA:  I have reviewed the data as listed Lab Results  Component Value Date   WBC 7.7 Nov 13, 2019   HGB 10.7 (L) 13-Nov-2019   HCT 33.6 (L) 13-Nov-2019   MCV 101.2 (H) November 13, 2019   PLT <5 (LL) 11/13/2019   Recent Labs    10/19/19 0330 10/19/19 0330 10/20/19 0259 10/20/19 1240 11/13/2019 1327  NA 137  --  133*  --  135  K 4.9   < > 5.4* 4.4 4.4  CL 94*  --  95*  --  96*  CO2 32  --  29  --  29  GLUCOSE 360*  --  327*  --  214*  BUN 32*  --  38*  --  22  CREATININE 0.98  --  1.03*  --  1.27*  CALCIUM 8.8*  --  8.4*  --  8.3*  GFRNONAA 58*  --  54*  --  42*  GFRAA >60  --  >60  --  49*  PROT 7.9  --  7.5  --  6.7  ALBUMIN 3.0*  --  3.0*  --  2.2*  AST 18  --  21  --  23  ALT 19  --  22  --  25  ALKPHOS 76  --  82  --  58  BILITOT 0.5  --  0.6  --  0.6   < > = values in this interval not displayed.    RADIOGRAPHIC STUDIES: I have personally reviewed the radiological images as listed and agreed with the findings in the report. EEG  Result Date: 10/12/2019 Lora Havens, MD     10/12/2019  9:09 AM Patient Name: AURIA DEMBEK MRN: [id number] Epilepsy Attending: Lora Havens Referring Physician/Provider: Dr Einar Gip Date: 10/12/2019 Duration: 23.34 mins Patient history: 74yo COVID female with worsening ams. EEG to evaluate for seizure. Level of alertness: awake AEDs during EEG study: None Technical aspects: This EEG study was done with scalp electrodes positioned according to the 10-20 International system of electrode placement. Electrical activity was acquired at a sampling rate of 500Hz  and reviewed with a high frequency filter of 70Hz  and a low frequency filter of 1Hz . EEG data were recorded continuously and digitally stored. DESCRIPTION: No clear posterior dominant rhythm was seen. EEG showed continuous generalized 3-5Hz  theta-delta slowing. Hyperventilation and photic stimulation were not performed. Of note, eeg was difficult to interpret dur to significant movement artifact. ABNORMALITY - Continuous slow, generalized IMPRESSION: This technically difficult study is suggestive of moderate diffuse encephalopathy, non specific to etiology.No seizures or epileptiform discharges were seen throughout the recording. Lora Havens   DG Chest Portable 1 View  Result Date: 2019-11-21 CLINICAL DATA:  Shortness of breath. Patient recently discharged from the hospital with COVID-19 pneumonia. EXAM: PORTABLE CHEST 1 VIEW COMPARISON:  October 14, 2019 FINDINGS: Bilateral pulmonary infiltrates are identified, right greater than left. The previously identified infiltrates on the left are significantly improved. The infiltrate on the right has worsened. Stable cardiomegaly and AICD device. No other interval changes. IMPRESSION: Worsening infiltrate/pneumonia on the right. Improved infiltrate on the left. Electronically Signed   By: Dorise Bullion III M.D   On: November 21, 2019 13:40   DG Chest Port 1 View  Result Date: 10/14/2019 CLINICAL DATA:  COVID-19 pneumonia.  EXAM: PORTABLE CHEST 1 VIEW COMPARISON:  One-view chest x-ray 10/10/2019 FINDINGS: Heart is enlarged. Pacing and defibrillator wires are stable. Atherosclerotic changes are noted at the aortic arch. Bilateral interstitial and airspace opacities are again seen. There is progressive disease in the right upper lobe and lower lobe. Left pleural effusion is suspected. IMPRESSION: Progressive interstitial and airspace disease in the right upper lobe and lower lobe compatible with multi lobar pneumonia. Electronically Signed   By: San Morelle M.D.   On: 10/14/2019 06:42   DG Chest Port 1 View  Result Date: 10/10/2019 CLINICAL DATA:  Pt from home, was having a virtual visit with PCP, Doc observed her being very short of breath and called EMS. Pt tested positive 1/11. EXAM: PORTABLE CHEST 1 VIEW COMPARISON:  07/26/2018. FINDINGS: Cardiac silhouette is mildly enlarged. No mediastinal or hilar masses. Lungs demonstrate prominent bronchovascular markings. There is also intervening hazy opacity most evident in right lower lung, more subtly suggested peripherally in the right mid lung and left lung base. No convincing pleural effusion.  No pneumothorax. Left anterior chest wall AICD is stable. Skeletal structures are demineralized but grossly intact. IMPRESSION: 1. Subtle hazy lung opacities are  superimposed upon chronically prominent bronchovascular markings, most evident in the right lower lung. Findings are suspicious for multifocal infection. Electronically Signed   By: Lajean Manes M.D.   On: 10/10/2019 13:39    ASSESSMENT & PLAN: 74 yo female with recent hospitalization for COVID pneumonia, 10/10/19-10/20/19, CHF with EF 20%, AICD, COPD, HTN, DM, who presented to [facility] ED for worsening dyspnea.   1.  Hypoxic respiratory failure, secondary to Covid pneumonia 2.  New onset of severe thrombocytopenia 3. Hypotension and septic shock  4. Mild anemia, secondary to bleeding, infection  5. CHF with EF 20% 6.  COPD 7. DM 8. COPD    Recommendations: -Her severe thrombocytopenia could be related to severe Covid infection, and sepsis/DIC.  Due to her recent exposure to Lovenox, HIT should be ruled out.  Infection triggered ITP is certainly a possibility.  She does have mild anemia, which is new from 6 days ago.  Given the normal total bilirubin, TTP is low on the differential.  I will check a reticulocyte count. I spoke with lab tech who has reviewed her blood smear and did not see schistocytes.  -I recommend IVIG 1g/kg daily X2 doses, I will order  -steroids would also help her thrombocytosis. We usually use dexa 40mg  daily for 4 days or prednisone 60mg  for much longer course over 1-2 months. I will discuss with Dr. Lorin Mercy  -agree with plt transfusion  -consider Doppler to rule out LE DVT, or CTA to rule out PE  -HIT panel has been ordered. Given her severe thrombocytopenia, high risk of bleeding, and moderate possibility of HIT (score 4), I would hold on argatroban unless we have definitive evidence of PE or DVT. Avoid heparin and Lovenox  -I will f/u tomorrow  -I spoke with her daughter    All questions were answered. The patient knows to call the clinic with any problems, questions or concerns.      Truitt Merle, MD 11/21/2019 4:45 PM

## 2019-10-27 NOTE — Procedures (Signed)
Central Venous Catheter Insertion Procedure Note Lisa [name] [id number] 1946/09/18  Procedure: Insertion of Central Venous Catheter Indications: Drug and/or fluid administration  Procedure Details Consent: Unable to obtain consent because of emergent medical necessity. Time Out: Verified patient identification, verified procedure, site/side was marked, verified correct patient position, special equipment/implants available, medications/allergies/relevent history reviewed, required imaging and test results available.  Performed  Maximum sterile technique was used including antiseptics, cap, gloves, gown and mask. Skin prep: Chlorhexidine; local anesthetic administered A antimicrobial bonded/coated triple lumen catheter was placed in the left femoral vein due to emergent situation using the Seldinger technique.  Evaluation Blood flow good Complications: Complications of Hematoma after the first attempt at insertion during a cardiac arrest after reestablishing ROSC Patient did tolerate procedure well. Chest X-ray ordered to verify placement.  CXR: N/A.  Lisa [name] 2019-10-30, 6:40 PM

## 2019-10-27 NOTE — H&P (Signed)
History and Physical    MERTIE KROHN [id number] DOB: December 12, 1945 DOA: 2019-11-05  PCP: Kelton Pillar, MD Patient coming from:  Home - lives with husband, grandson, granddaughter, nephew; NOK: Daughter, Jola Babinski, [phone]  Chief Complaint: Worsening COVID-19 symptoms  HPI: Lisa [name] is a 74 y.o. female with medical history significant of Systolic CHF ([id number]), AICD, COPD, HTN, and DM2 who was hospitalized at [facility] for COVID-19 infection from 1/15-25.  She returned today with hypoxia, hypotension.   She reports feeling somewhat better at the time of d/c from [facility] but not that much.  Mostly, she was just eager to get home.  Since home, she has been weak, tired, unable to eat/drink.  She has cough and has had hemoptysis with the cough as well as nosebleeds.  She has not noticed a rash.  She has had chills and sweats without clear fever.  She was increasingly SOB from last night until today.   ED Course:  COVID.  Admitted, discharged, now needs O2, sats in 70s on RA.  Now on 4L and in 90s.  CXR with worsening R-sided infection, better on the left.  Given IVF and no other treatments.  Will order Albuterol.  Review of Systems: As per HPI; otherwise review of systems reviewed and negative.    Past Medical History:  Diagnosis Date  . AICD (automatic cardioverter/defibrillator) present 2014   Biotroniks  . Arthritis    knees with arthritis as well as has back, pt. remarks that her hands fall asleep when she is in the bed or in certain positions   . Bell's palsy   . Carotid artery disease ([place])    a. duplex - moderate bilateral carotid disease (60-79% 06/2017).  . Chronic systolic CHF (congestive heart failure) ([place])   . COPD (chronic obstructive pulmonary disease) ([place])   . COVID-19 10/06/2019   rapid test positive at local urgent care  . Diabetes mellitus without complication (HCC)    borderline  . Diabetic neuropathy ([place]) 05/26/2019  . Dyspnea   . Former tobacco use    . GERD (gastroesophageal reflux disease)    uses otc for occas. heartburn   . Hyperlipidemia   . Hypertension   . Mild CAD    a. nonobstructive CAD (50% PLOM 08/2017).  . Neuropathy   . Non-ischemic cardiomyopathy ([place])   . PAD (peripheral artery disease) ([place])    a.  s/p L-R fem-fem BPG 2016.  [name] Pneumonia    hosp. in [place]-2015  . Subclavian steal syndrome    a. h/o left subclavian steal.    Past Surgical History:  Procedure Laterality Date  . ABDOMINAL AORTAGRAM N/A 11/27/2014   Procedure: ABDOMINAL Maxcine Ham;  Surgeon: Elam Dutch, MD;  Location: [facility] CATH LAB;  Service: Cardiovascular;  Laterality: N/A;  . ABDOMINAL HYSTERECTOMY    . APPENDECTOMY    . CARDIAC CATHETERIZATION     02/20/13: Normal coronaries, moderate LV dysfunction, EF 35%. Medical RX ([facility])  . CARDIAC DEFIBRILLATOR PLACEMENT  Aug. 2014   dual chamber Biotronik ICD 05/06/13 ([facility], Dr. Minna Merritts)  . ENDARTERECTOMY FEMORAL Right 12/21/2014   Procedure: ENDARTERECTOMY FEMORAL;  Surgeon: Elam Dutch, MD;  Location: [facility];  Service: Vascular;  Laterality: Right;  . FEMORAL-FEMORAL BYPASS GRAFT Bilateral 12/21/2014   Procedure: BYPASS GRAFT LEFT FEMORAL-RIGHT FEMORAL ARTERY;  Surgeon: Elam Dutch, MD;  Location: [facility];  Service: Vascular;  Laterality: Bilateral;  . JOINT REPLACEMENT Bilateral    implants  . RIGHT/LEFT HEART CATH AND  CORONARY ANGIOGRAPHY N/A 08/28/2017   Procedure: RIGHT/LEFT HEART CATH AND CORONARY ANGIOGRAPHY;  Surgeon: Larey Dresser, MD;  Location: [place] CV LAB;  Service: Cardiovascular;  Laterality: N/A;  . TUBAL LIGATION      Social History   Socioeconomic History  . Marital status: Married    Spouse name: Not on file  . Number of children: Not on file  . Years of education: Not on file  . Highest education level: Not on file  Occupational History  . Not on file  Tobacco Use  . Smoking status: Former Smoker    Types: Cigarettes    Quit date: 11/14/2010    Years since  quitting: 8.9  . Smokeless tobacco: Never Used  Substance and Sexual Activity  . Alcohol use: No    Alcohol/week: 0.0 standard drinks  . Drug use: No  . Sexual activity: Not on file  Other Topics Concern  . Not on file  Social History Narrative  . Not on file   Social Determinants of Health   Financial Resource Strain:   . Difficulty of Paying Living Expenses: Not on file  Food Insecurity:   . Worried About Charity fundraiser in the Last Year: Not on file  . Ran Out of Food in the Last Year: Not on file  Transportation Needs:   . Lack of Transportation (Medical): Not on file  . Lack of Transportation (Non-Medical): Not on file  Physical Activity:   . Days of Exercise per Week: Not on file  . Minutes of Exercise per Session: Not on file  Stress:   . Feeling of Stress : Not on file  Social Connections:   . Frequency of Communication with Friends and Family: Not on file  . Frequency of Social Gatherings with Friends and Family: Not on file  . Attends Religious Services: Not on file  . Active Member of Clubs or Organizations: Not on file  . Attends Archivist Meetings: Not on file  . Marital Status: Not on file  Intimate Partner Violence:   . Fear of Current or Ex-Partner: Not on file  . Emotionally Abused: Not on file  . Physically Abused: Not on file  . Sexually Abused: Not on file    Allergies  Allergen Reactions  . Oxycodone Shortness Of Breath  . Cymbalta [Duloxetine Hcl]     Loopy feeling  . Empagliflozin Itching    Whole body itching  . Entresto [Sacubitril-Valsartan] Other (See Comments)    Impaired coordination---causes patient to drop many things, blood pressure bottomed out  . Nortriptyline     Legs jumping    Family History  Problem Relation Age of Onset  . Heart disease Mother   . Hypertension Mother     Prior to Admission medications   Medication Sig Start Date End Date Taking? Authorizing Provider  albuterol (PROVENTIL HFA;VENTOLIN  HFA) 108 (90 Base) MCG/ACT inhaler Inhale 2 puffs into the lungs every 6 (six) hours as needed for wheezing or shortness of breath. 03/20/18   Lockamy, Timothy, DO  albuterol (PROVENTIL) (2.5 MG/3ML) 0.083% nebulizer solution Take 3 mLs by nebulization every 4 (four) hours as needed for wheezing. 08/01/17   [provider]  allopurinol (ZYLOPRIM) 100 MG tablet Take 100 mg by mouth daily.    [provider]  aspirin EC 81 MG tablet Take 162 mg by mouth daily.    [provider]  atorvastatin (LIPITOR) 20 MG tablet TAKE 1 TABLET BY MOUTH EVERY DAY  Patient taking differently: Take 20 mg by mouth daily at 6 PM.  09/01/19   Larey Dresser, MD  colchicine 0.6 MG tablet Take 0.6 mg by mouth as needed (gout).  09/01/19   [provider]  Cyanocobalamin (VITAMIN B 12 PO) Take by mouth. Gummy    [provider]  digoxin (LANOXIN) 0.125 MG tablet TAKE 0.5 TABLETS (62.5 MCG TOTAL) BY MOUTH DAILY. Patient taking differently: Take 0.0625 mg by mouth daily. 1/2 tab daily 10/03/19   Larey Dresser, MD  fluticasone furoate-vilanterol (BREO ELLIPTA) 200-25 MCG/INH AEPB Inhale 1 puff into the lungs daily.    [provider]  HYDROcodone-acetaminophen (NORCO) 7.5-325 MG tablet Take 1 tablet by mouth every 6 (six) hours as needed for pain.    [provider]  levothyroxine (SYNTHROID, LEVOTHROID) 25 MCG tablet Take 25 mcg by mouth daily before breakfast.    [provider]  metFORMIN (GLUCOPHAGE-XR) 500 MG 24 hr tablet Take 500 mg by mouth 2 (two) times daily. 10/03/18   [provider]  nortriptyline (PAMELOR) 10 MG capsule Take 10 mg by mouth at bedtime.     [provider]  pregabalin (LYRICA) 100 MG capsule Take 100 mg by mouth every evening.     [provider]  Tafamidis (VYNDAMAX) 61 MG CAPS Take 61 mg by mouth daily. 10/03/19   Larey Dresser, MD  torsemide (DEMADEX) 20 MG tablet Take 1 tablet (20 mg total) by mouth  daily. 10/27/19   Bonnielee Haff, MD  TRULICITY 1.5 0000000 SOPN Inject 1.5 mg into the skin once a week. Inject every Friday 05/29/19   [provider]    Physical Exam: Vitals:   2019-11-25 1715 Nov 25, 2019 1723 11-25-2019 1724 11-25-2019 1730  BP: (!) 121/58 (!) 121/58  112/60  Pulse: 77 69  (!) 55  Resp: (!) 28 (!) 27  (!) 27  Temp:   99.1 F (37.3 C)   TempSrc:   Oral   SpO2: 98%   (!) 88%  Weight:      Height:         . General:  Appears calm and comfortable and is NAD. BP is soft.  Poor access despite multiple attempts - has a 24 in her R hand for now . Eyes:  PERRL, EOMI, normal lids, iris . ENT:  grossly normal hearing, significant amount of blood on lips & tongue.  I had her drink water to clear it and she clearly has petechiae on her tongue and palate with mild frank blood in her mouth. . Neck:  no LAD, masses or thyromegaly . Cardiovascular:  RRR, no m/r/g. No LE edema.  [name] Respiratory:   Scattered rhonchi.  Mildly increased respiratory effort on 4L. [name] Abdomen:  soft, NT, ND, NABS . Skin:  Diffuse petechiae noted particularly on feet, arms . Musculoskeletal:  grossly normal tone BUE/BLE, good ROM, no bony abnormality . Psychiatric:  grossly normal mood and affect, speech fluent and appropriate, AOx3 . Neurologic:  CN 2-12 grossly intact, moves all extremities in coordinated fashion    Radiological Exams on Admission: DG Chest Portable 1 View  Result Date: November 25, 2019 CLINICAL DATA:  Shortness of breath. Patient recently discharged from the hospital with COVID-19 pneumonia. EXAM: PORTABLE CHEST 1 VIEW COMPARISON:  October 14, 2019 FINDINGS: Bilateral pulmonary infiltrates are identified, right greater than left. The previously identified infiltrates on the left are significantly improved. The infiltrate on the right has worsened. Stable cardiomegaly and AICD device. No other  interval changes. IMPRESSION: Worsening infiltrate/pneumonia on the right. Improved infiltrate on  the left. Electronically Signed   By: Dorise Bullion III M.D   On: 11-15-2019 13:40    EKG: Independently reviewed.  NSR with rate 78; LAFB, LVH, nonspecific ST changes with no evidence of acute ischemia   Labs on Admission: I have personally reviewed the available labs and imaging studies at the time of the admission.  Pertinent labs:   Glucose 214 BUN 22/Creatinine 1.27/GFR 49 Albumin 2.2 HS troponin 43 Lactate 1.6 COVID POSITIVE on 1/15   Assessment/Plan Principal Problem:   Acute hypoxemic respiratory failure due to COVID-19 [facility]) Active Problems:   Essential hypertension   Type 2 diabetes mellitus with vascular disease (HCC)   COPD (chronic obstructive pulmonary disease) (HCC)   Thrombocytopenia (HCC)   Chronic combined systolic and diastolic CHF (congestive heart failure) (HCC)   Acute respiratory failure with hypoxia due to COVID-19 PNA -Patient with recent hospitalization at [facility] from 1/15-25 for COVID-19 PNA -She was treated with steroids and remdesivir and was discharged with home O2, 1-3L -She reports ongoing fatigue and malaise with anorexia, cough with hemoptysis, and epistaxis -She developed SOB overnight and was in the 70s upon arrival here, improved to 90s with 4L [place] O2 -COVID POSITIVE on 1/15 and so would not be out of her quarantine period until 10/31/19 -The patient has comorbidities which may increase the risk for ARDS/MODS including: age, HTN, DM, COPD -COVID labs today are pending -CXR with multifocal opacities which may be c/w COVID vs. Multifocal PNA; this is worse than during her prior admission on the right -She received 5 doses of Rocephin for UTI during her last hospitalization but did not receive abx for PNA; she is likely to benefit from CAP coverage at this time -Patient was seen wearing full PPE including: gown, gloves, head cover, N95, and face shield; donning and doffing was in compliance with current standards.  Persistent hypotension -The  patient has a multitude of issues with multisystem organ failure (respiratory, hematologic, and circulatory) -She has been fluid responsive but repeatedly hypotensive or borderline throughout her stay -She also has poor access (likely associated with utter lack of platelets and low BP)  -I have requested PCCM admission to the ICU based on the critical nature of the patient's illness at this time; Dr. Nelda Marseille is in agreement -Has h/o HTN, obviously needs to hold BP medications at this time  New onset severe thrombocytopenia -Upon my evaluation, the patient was noted to have bleeding mucosa as well as diffuse petechiae -I went to discuss with her nurse and was informed that her CBC had not resulted because the lab thought it was erroneous due to her platelets of 1 -However, this appears to be accurate based on petechiae, bleeding mucosa, and hemoptysis/epistaxis -This could be related to ITP/TTP from recent COVID infection - which may respond to IVIg with rapid improvement  -She also appears to have received erroneously high dosing of Lovenox (weight inadvertently entered as 177 kg instead of pounds and so she was receiving 90 mg daily) - ?HIT - needs to be ruled out -Rocephin is also a consideration - maybe last dose 10 days ago - although this is less likely -Dr. Burr Medico to see, will probably give IVIg tonight -Given concern for possible HIT and the thrombosis that it can cause, she likely needs a CTA to r/o PE -Given bleeding and platelets <10, will transfuse platelets at this time   Total critical care time: 105  minutes Critical care time was exclusive of separately billable procedures and treating other patients. Critical care was necessary to treat or prevent imminent or life-threatening deterioration. Critical care was time spent personally by me on the following activities: development of treatment plan with patient and/or surrogate as well as nursing, discussions with consultants, evaluation  of patient's response to treatment, examination of patient, obtaining history from patient or surrogate, ordering and performing treatments and interventions, ordering and review of laboratory studies, ordering and review of radiographic studies, pulse oximetry and re-evaluation of patient's condition.   DVT prophylaxis:  Agratroban  Code Status:  Full - confirmed with patient Family Communication: None present; I spoke with the patient's daughter by telephone. Disposition Plan:  To be determined Consults called: PCCM; Hematology Admission status: Admit - It is my clinical opinion that admission to INPATIENT is reasonable and necessary because of the expectation that this patient will require hospital care that crosses at least 2 midnights to treat this condition based on the medical complexity of the problems presented.  Given the aforementioned information, the predictability of an adverse outcome is felt to be significant.    Karmen Bongo MD [facility]   How to contact the [facility] Attending or Consulting provider [place] or covering provider during after hours [place], for this patient?  1. Check the care team in [facility] and look for a) attending/consulting TR[facility] provider listed and b) the [facility] team listed 2. Log into [email or web address] and use 's universal password to access. If you do not have the password, please contact the hospital operator. 3. Locate the [facility] provider you are looking for under [facility] and page to a number that you can be directly reached. 4. If you still have difficulty reaching the provider, please page the [facility] (Director on Call) for the Hospitalists listed on amion for assistance.   09-Nov-2019, 5:38 PM

## 2019-10-27 NOTE — ED Notes (Signed)
Called carelink one person ahead of her .4:16

## 2019-10-27 NOTE — ED Triage Notes (Signed)
Pt to ED via EMS from home, pt recently d/c from [facility], No medications given by EMS. Pt hypoxic on EMS arrival  95% [place] 6L. No medications given by EMS. Hypotensive bp 80/50- pt alert.

## 2019-10-27 NOTE — Procedures (Signed)
CPR Note  PEA arrest with pacer spikes that were capturing an electrical wave form but not a viable heart beat.  Epi, bicarb and IVF given.  ROSC established x3.  Please see code sheet for details.  After fourth arrest decision was made to stop resuscitative efforts and family was notified by Dr. Bryson Corona, M.D. [facility] Pulmonary/Critical Care Medicine.

## 2019-10-27 NOTE — Progress Notes (Signed)
Attending Note:  74 year old female with recent diagnosis of COVID-19 on 1/15 that was hospitalized til 1/25 for COVID pneumonia and hypoxemic respiratory failure.  She completed a course of steroids and remdesivir.  She was also on lovenox for DVT prophylaxis.  She was discharged with platelet count in the 400's to return to the hospital today with SOB, evidence of fluid overload and borderline BP that is fluid responsive.  On exam, she is alert and interactive.  Mouth full of blood clots from gums.  H/O was contacted, platelets were started and admission orders where in the process of changing to ICU when I received a call from the bedside RN.  Patient lost her consciousness and is no longer breathing.  I arrived bedside.  Patient was apneic and was promptly intubated.  No blood was seen imminating from the trachea but was mostly in the mouth and going down towards the airway.  OGT was placed.  No bleeding there.  Patient offered little resistance during intubation.  After intubation, ETT was verified by CO2 detector and auscultation bilaterally.  OGT was auscultated over the stomach with no difficulty.  A few minutes later the patient had a cardiac arrest.  ACLS protocol was followed.  ROSC established but arrested 3 more times.  At that point I saw no reversible cause of her repeated cardiac arrest and decision was made to stop.  Dr. Lorin Mercy had spoke to the family and they arrived to the same conclusion and resuscitative efforts were stopped after the fourth arrest.  Dr. Lorin Mercy will notify family.  PCCM will sign off.  The patient is critically ill with multiple organ systems failure and requires high complexity decision making for assessment and support, frequent evaluation and titration of therapies, application of advanced monitoring technologies and extensive interpretation of multiple databases.   Critical Care Time devoted to patient care services described in this note is  35  Minutes. This time  reflects time of care of this signee Dr Jennet Maduro. This critical care time does not reflect procedure time, or teaching time or supervisory time of PA/NP/Med student/Med Resident etc but could involve care discussion time.  Rush Farmer, M.D. [facility] Pulmonary/Critical Care Medicine.

## 2019-10-27 NOTE — Progress Notes (Signed)
   November 15, 2019 2000  Clinical Encounter Type  Visited With Patient and family together;Patient not available;Family;Health care provider  Visit Type Initial;Spiritual support;ED;Death  Referral From Nurse  Consult/Referral To Chaplain  Spiritual Encounters  Spiritual Needs Emotional;Grief support;Prayer  Stress Factors  Patient Stress Factors Not reviewed  Family Stress Factors Major life changes;Loss of control;Loss   Met w/ pt's daughter with MD, while in proper PPE supported daughter in pt's room after death, prayed, supported lament, empathetic listening.  Pt's husband is hospitalized at [facility].  Passed along request to overnight chaplain to share for spiritual care at [facility].  Also, another daughter of pt's is at [facility] following dual transplants.  Temple Pacini [place], [phone]

## 2019-10-27 NOTE — ED Notes (Signed)
Pt's upper dentures found in room, placed in container, name label placed on container and taken to morgue with pt

## 2019-10-27 NOTE — Procedures (Signed)
OGT Insertion By MD  OGT inserted by MD after intubation under direct laryngoscopy given platelet count of <5  Verified by auscultation  Rush Farmer, M.D. [facility] Pulmonary/Critical Care Medicine.

## 2019-10-27 NOTE — ED Notes (Signed)
Versed 2mg  IV and Fentanyl 128mcg IV given

## 2019-10-27 NOTE — Progress Notes (Signed)
Another consult was placed to IV Therapy for another iv restart;  IV Therapy had just placed an iv in the R upper arm w ultrasound, but it infiltrated soon after it was placed;  The Left arm was assessed thoroughly by this writer, but no suitable veins were even noted to attempt;  RN aware;  Pt has a 24 ga in the right hand with fluids infusing at present.

## 2019-10-27 NOTE — ED Notes (Signed)
Pt started to get very restless,  St's she needs her oxygen. This RN explained to pt that her oxygen was on.  Pt remains at 98% on [place] at 4.5 LPM.  Pt continued to become more restless, trying to get out of bed.   Continues to say she needs oxygen.  Pt then became unresponsive, code blue was called by this RN  Pt's 02 sats began to drop to 70's.  Pt being bagged at this time with agonal respirations.  Dr. Lorin Mercy and Dr. Nelda Marseille in room at this time.   Pt intubated by Dr. Nelda Marseille and compressions started.

## 2019-10-27 DEATH — deceased

## 2019-10-28 LAB — PATHOLOGIST SMEAR REVIEW: Path Review: REACTIVE

## 2019-11-17 IMAGING — CT CT HEAD W/O CM
4 series · 17 of 47 positions shown, 19 images · non-contrast
Comparison: Head CT dated 02/23/2014

CLINICAL DATA: 72-year-old female with ataxia and confusion.

EXAM:
CT HEAD WITHOUT CONTRAST
TECHNIQUE: Contiguous axial images were obtained from the base of the skull
through the vertex without intravenous contrast.

[Series 3: head bone · axial · 0.44mm/px · z∈[+1278,+1336]mm · 4 of 84 slices shown]
[im 9/84  bone]
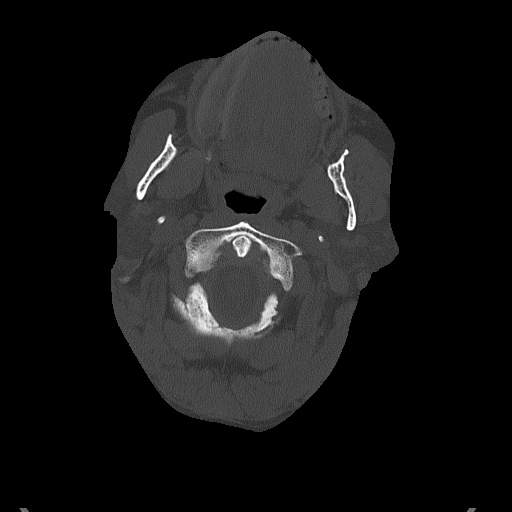
[im 17/84  bone]
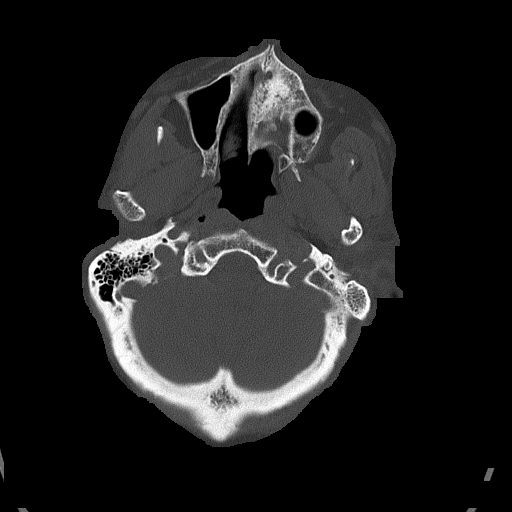
[im 25/84  bone]
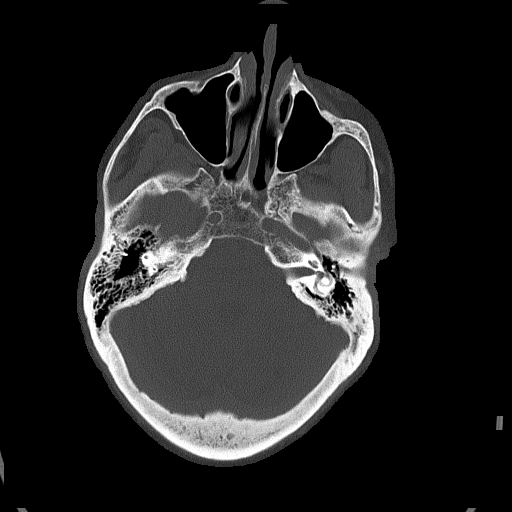
[im 38/84  bone]
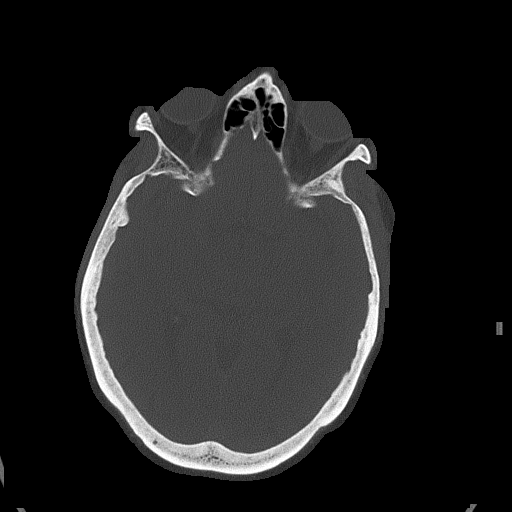

[Series 4: head without · axial · non-contrast · 0.44mm/px · z∈[+1282,+1402]mm · 7 of 34 slices shown, 9 images]
[im 5/34  brain]
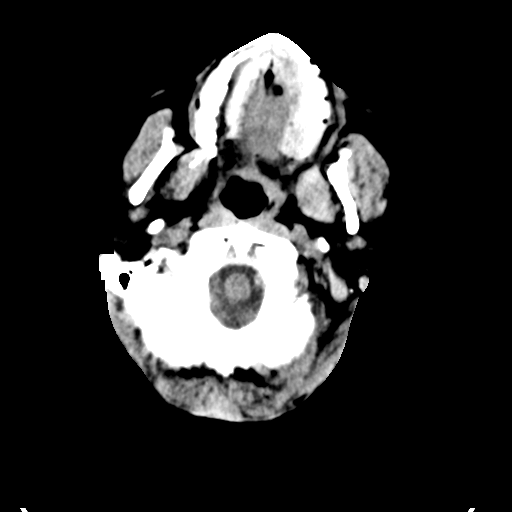
[im 5/34  bone]
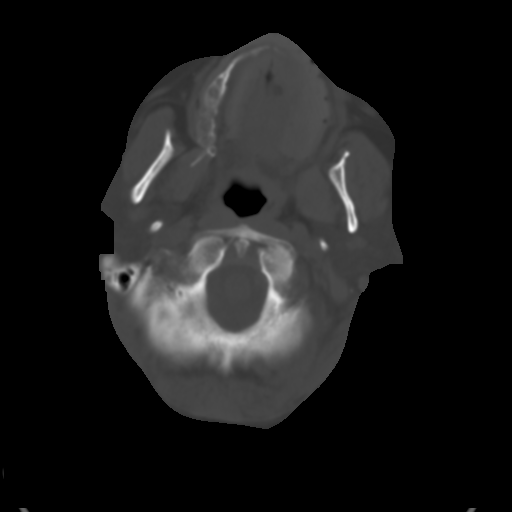
[im 9/34  brain]
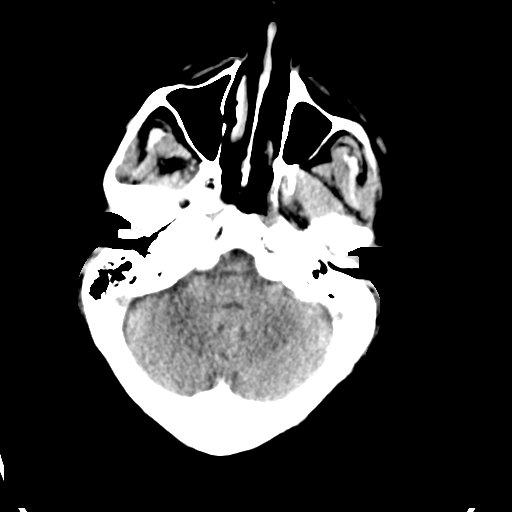
[im 13/34  brain]
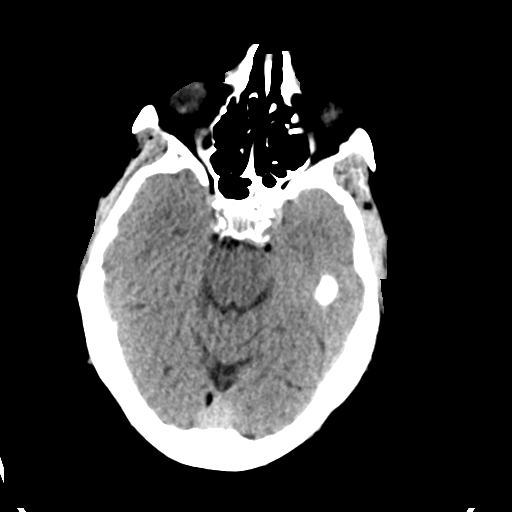
[im 17/34  brain]
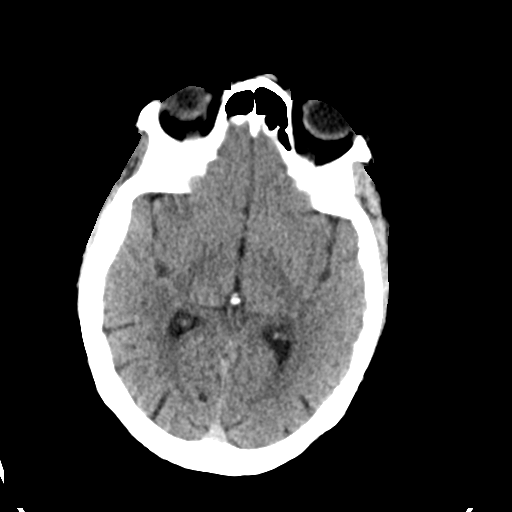
[im 21/34  brain]
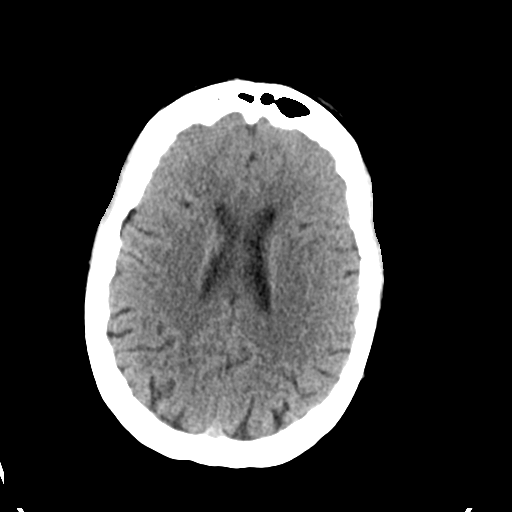
[im 21/34  bone]
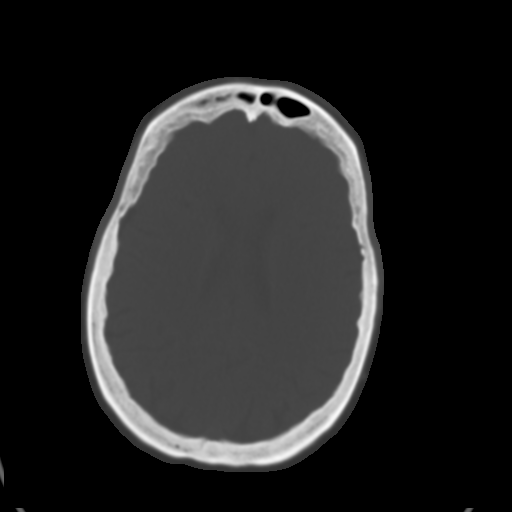
[im 25/34  brain]
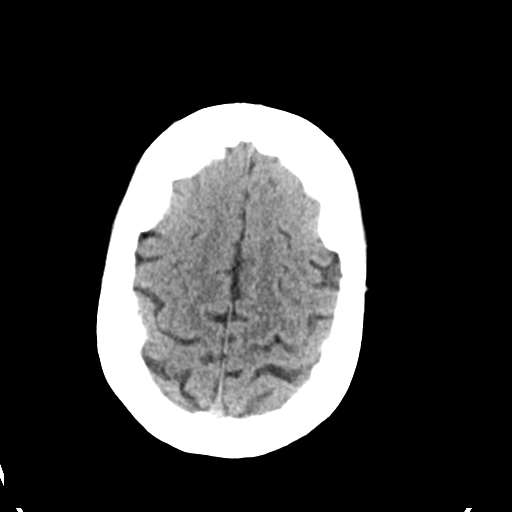
[im 29/34  brain]
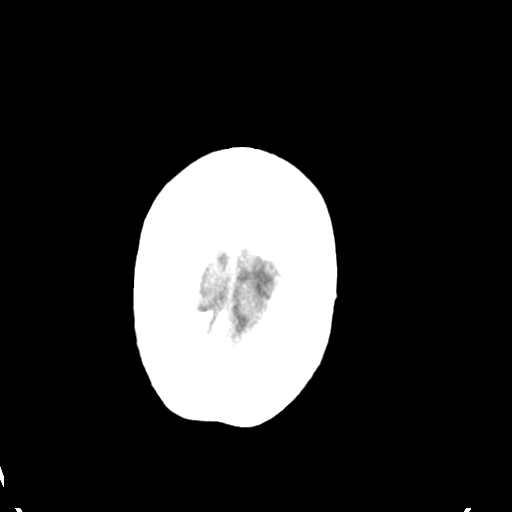

[Series 5: head without cor · coronal · non-contrast · 0.40mm/px · 3 of 73 slices shown]
[im 25/73  brain]
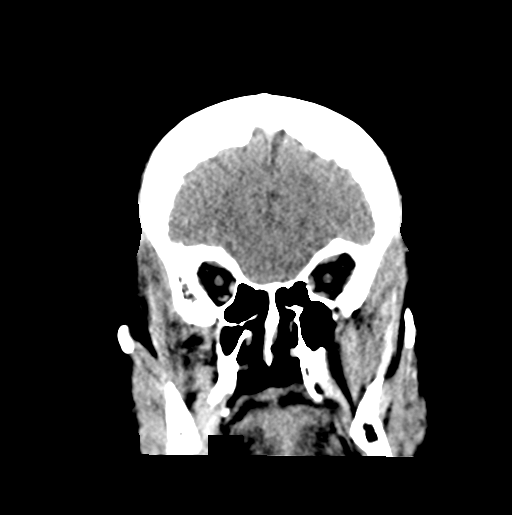
[im 33/73  brain]
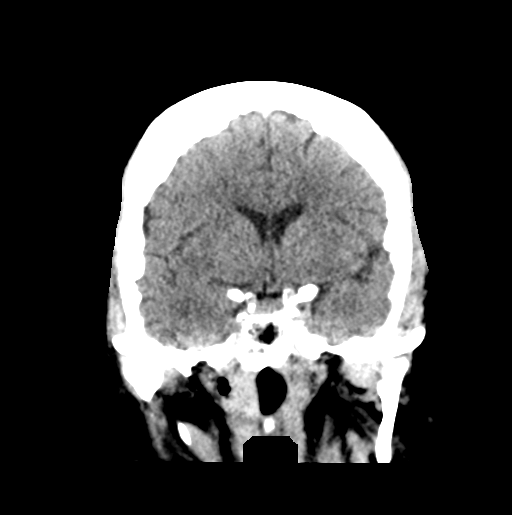
[im 41/73  brain]
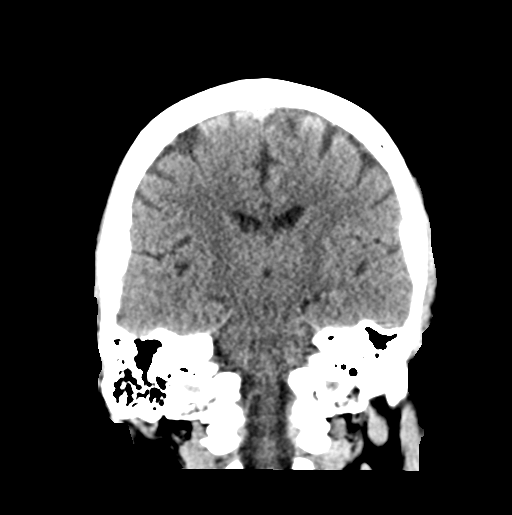

[Series 6: head without sag · sagittal · non-contrast · 0.39mm/px · 3 of 67 slices shown]
[im 23/67  brain]
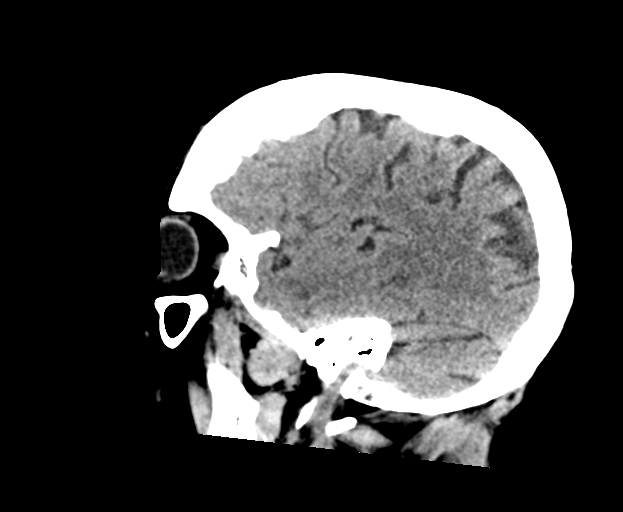
[im 34/67  brain]
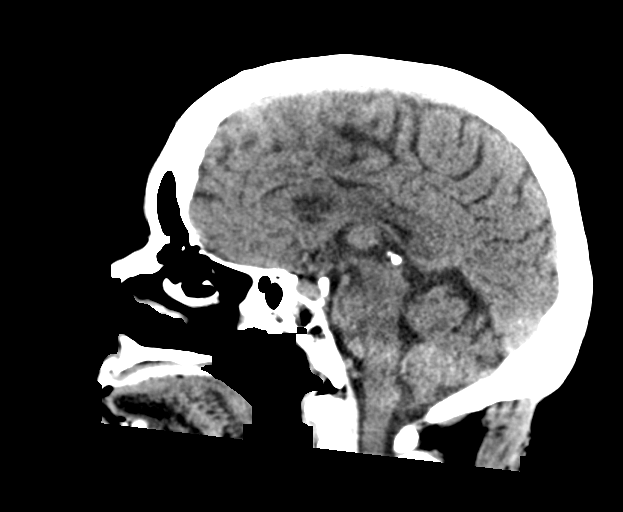
[im 45/67  brain]
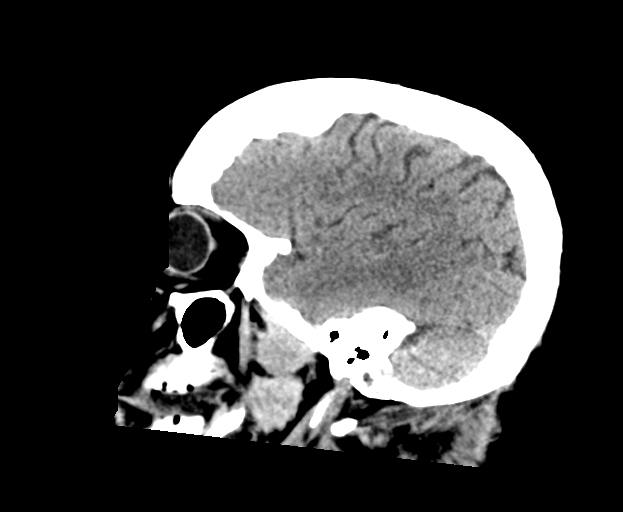

[17 of 47 positions shown; findings below may reference images not displayed]

FINDINGS: Brain: The ventricles and sulci appropriate size for patient's age.
Mild periventricular and deep white matter chronic microvascular
ischemic changes noted. There is no acute intracranial hemorrhage.
No mass effect or midline shift. No extra-axial fluid collection.

Vascular: No hyperdense vessel or unexpected calcification.

Skull: Normal. Negative for fracture or focal lesion.

Sinuses/Orbits: No acute finding.

Other: None
IMPRESSION: 1. No acute intracranial hemorrhage.
2. Mild chronic microvascular ischemic changes. If symptoms persist,
and there are no contraindications, MRI may provide better
evaluation if clinically indicated.
